# Patient Record
Sex: Female | Born: 1935 | Race: Black or African American | Hispanic: No | Smoking: Never smoker
Health system: Southern US, Community
[De-identification: ages and names within clinical notes are randomized; demographics above are authoritative.]

## PROBLEM LIST (undated history)

## (undated) ENCOUNTER — Inpatient Hospital Stay: Admission: EM | Payer: Self-pay | Source: Home / Self Care

## (undated) DIAGNOSIS — F0390 Unspecified dementia without behavioral disturbance: Secondary | ICD-10-CM

## (undated) DIAGNOSIS — M1990 Unspecified osteoarthritis, unspecified site: Secondary | ICD-10-CM

## (undated) DIAGNOSIS — J45909 Unspecified asthma, uncomplicated: Secondary | ICD-10-CM

## (undated) DIAGNOSIS — J40 Bronchitis, not specified as acute or chronic: Secondary | ICD-10-CM

## (undated) DIAGNOSIS — E041 Nontoxic single thyroid nodule: Secondary | ICD-10-CM

## (undated) DIAGNOSIS — H409 Unspecified glaucoma: Secondary | ICD-10-CM

## (undated) DIAGNOSIS — E119 Type 2 diabetes mellitus without complications: Secondary | ICD-10-CM

## (undated) DIAGNOSIS — C50919 Malignant neoplasm of unspecified site of unspecified female breast: Secondary | ICD-10-CM

## (undated) DIAGNOSIS — K5792 Diverticulitis of intestine, part unspecified, without perforation or abscess without bleeding: Secondary | ICD-10-CM

## (undated) DIAGNOSIS — E669 Obesity, unspecified: Secondary | ICD-10-CM

## (undated) DIAGNOSIS — I639 Cerebral infarction, unspecified: Secondary | ICD-10-CM

## (undated) DIAGNOSIS — I10 Essential (primary) hypertension: Secondary | ICD-10-CM

## (undated) DIAGNOSIS — G20 Parkinson's disease: Secondary | ICD-10-CM

## (undated) DIAGNOSIS — G20A1 Parkinson's disease without dyskinesia, without mention of fluctuations: Secondary | ICD-10-CM

## (undated) HISTORY — DX: Unspecified glaucoma: H40.9

## (undated) HISTORY — DX: Malignant neoplasm of unspecified site of unspecified female breast: C50.919

## (undated) HISTORY — DX: Essential (primary) hypertension: I10

## (undated) HISTORY — PX: ABDOMINAL HYSTERECTOMY: SHX81

## (undated) HISTORY — PX: KNEE SURGERY: SHX244

## (undated) HISTORY — DX: Diverticulitis of intestine, part unspecified, without perforation or abscess without bleeding: K57.92

## (undated) HISTORY — DX: Nontoxic single thyroid nodule: E04.1

## (undated) HISTORY — PX: JOINT REPLACEMENT: SHX530

## (undated) HISTORY — PX: APPENDECTOMY: SHX54

## (undated) HISTORY — DX: Unspecified osteoarthritis, unspecified site: M19.90

## (undated) HISTORY — PX: OTHER SURGICAL HISTORY: SHX169

---

## 1983-02-20 DIAGNOSIS — C50919 Malignant neoplasm of unspecified site of unspecified female breast: Secondary | ICD-10-CM

## 1983-02-20 HISTORY — PX: MASTECTOMY: SHX3

## 1983-02-20 HISTORY — DX: Malignant neoplasm of unspecified site of unspecified female breast: C50.919

## 2000-10-08 ENCOUNTER — Ambulatory Visit (HOSPITAL_COMMUNITY): Admission: RE | Admit: 2000-10-08 | Discharge: 2000-10-08 | Payer: Self-pay | Admitting: Family Medicine

## 2000-10-08 ENCOUNTER — Encounter: Payer: Self-pay | Admitting: Family Medicine

## 2001-10-19 ENCOUNTER — Encounter: Payer: Self-pay | Admitting: Family Medicine

## 2001-10-19 ENCOUNTER — Ambulatory Visit (HOSPITAL_COMMUNITY): Admission: RE | Admit: 2001-10-19 | Discharge: 2001-10-19 | Payer: Self-pay | Admitting: Family Medicine

## 2002-06-12 ENCOUNTER — Emergency Department (HOSPITAL_COMMUNITY): Admission: EM | Admit: 2002-06-12 | Discharge: 2002-06-12 | Payer: Self-pay | Admitting: Emergency Medicine

## 2002-06-12 ENCOUNTER — Encounter: Payer: Self-pay | Admitting: Emergency Medicine

## 2002-06-14 ENCOUNTER — Encounter: Payer: Self-pay | Admitting: Family Medicine

## 2002-06-14 ENCOUNTER — Ambulatory Visit (HOSPITAL_COMMUNITY): Admission: RE | Admit: 2002-06-14 | Discharge: 2002-06-14 | Payer: Self-pay | Admitting: Emergency Medicine

## 2002-06-22 ENCOUNTER — Ambulatory Visit (HOSPITAL_COMMUNITY): Admission: RE | Admit: 2002-06-22 | Discharge: 2002-06-22 | Payer: Self-pay | Admitting: Cardiology

## 2002-06-22 ENCOUNTER — Encounter: Payer: Self-pay | Admitting: Cardiology

## 2002-07-14 ENCOUNTER — Emergency Department (HOSPITAL_COMMUNITY): Admission: EM | Admit: 2002-07-14 | Discharge: 2002-07-14 | Payer: Self-pay | Admitting: Emergency Medicine

## 2002-07-14 ENCOUNTER — Encounter: Payer: Self-pay | Admitting: Emergency Medicine

## 2002-10-19 ENCOUNTER — Ambulatory Visit (HOSPITAL_COMMUNITY): Admission: RE | Admit: 2002-10-19 | Discharge: 2002-10-19 | Payer: Self-pay | Admitting: Family Medicine

## 2002-10-19 ENCOUNTER — Encounter: Payer: Self-pay | Admitting: Family Medicine

## 2002-11-06 ENCOUNTER — Emergency Department (HOSPITAL_COMMUNITY): Admission: EM | Admit: 2002-11-06 | Discharge: 2002-11-06 | Payer: Self-pay | Admitting: *Deleted

## 2002-11-24 ENCOUNTER — Ambulatory Visit (HOSPITAL_COMMUNITY): Admission: RE | Admit: 2002-11-24 | Discharge: 2002-11-24 | Payer: Self-pay | Admitting: Family Medicine

## 2002-11-28 ENCOUNTER — Encounter: Payer: Self-pay | Admitting: Orthopedic Surgery

## 2004-05-23 ENCOUNTER — Observation Stay (HOSPITAL_COMMUNITY): Admission: AD | Admit: 2004-05-23 | Discharge: 2004-05-26 | Payer: Self-pay | Admitting: Family Medicine

## 2004-05-23 ENCOUNTER — Ambulatory Visit: Payer: Self-pay | Admitting: *Deleted

## 2004-10-04 ENCOUNTER — Ambulatory Visit (HOSPITAL_COMMUNITY): Admission: RE | Admit: 2004-10-04 | Discharge: 2004-10-04 | Payer: Self-pay | Admitting: Family Medicine

## 2005-03-03 ENCOUNTER — Ambulatory Visit: Payer: Self-pay | Admitting: Orthopedic Surgery

## 2006-01-06 HISTORY — PX: FRACTURE SURGERY: SHX138

## 2006-01-29 ENCOUNTER — Ambulatory Visit: Payer: Self-pay | Admitting: Orthopedic Surgery

## 2006-06-29 ENCOUNTER — Ambulatory Visit: Payer: Self-pay | Admitting: Orthopedic Surgery

## 2006-07-20 ENCOUNTER — Ambulatory Visit: Payer: Self-pay | Admitting: Orthopedic Surgery

## 2006-11-19 ENCOUNTER — Ambulatory Visit (HOSPITAL_COMMUNITY): Admission: RE | Admit: 2006-11-19 | Discharge: 2006-11-19 | Payer: Self-pay | Admitting: Internal Medicine

## 2006-12-01 ENCOUNTER — Ambulatory Visit: Payer: Self-pay | Admitting: Orthopedic Surgery

## 2006-12-01 DIAGNOSIS — Z8679 Personal history of other diseases of the circulatory system: Secondary | ICD-10-CM | POA: Insufficient documentation

## 2006-12-01 DIAGNOSIS — M25519 Pain in unspecified shoulder: Secondary | ICD-10-CM | POA: Insufficient documentation

## 2006-12-28 ENCOUNTER — Inpatient Hospital Stay (HOSPITAL_COMMUNITY): Admission: EM | Admit: 2006-12-28 | Discharge: 2006-12-29 | Payer: Self-pay | Admitting: Emergency Medicine

## 2006-12-28 ENCOUNTER — Ambulatory Visit: Payer: Self-pay | Admitting: Orthopedic Surgery

## 2007-01-06 ENCOUNTER — Inpatient Hospital Stay: Admission: AD | Admit: 2007-01-06 | Discharge: 2007-03-06 | Payer: Self-pay | Admitting: Internal Medicine

## 2007-01-07 DIAGNOSIS — K5792 Diverticulitis of intestine, part unspecified, without perforation or abscess without bleeding: Secondary | ICD-10-CM

## 2007-01-07 HISTORY — DX: Diverticulitis of intestine, part unspecified, without perforation or abscess without bleeding: K57.92

## 2007-02-12 ENCOUNTER — Ambulatory Visit (HOSPITAL_COMMUNITY): Admission: RE | Admit: 2007-02-12 | Discharge: 2007-02-12 | Payer: Self-pay | Admitting: Internal Medicine

## 2007-03-10 ENCOUNTER — Ambulatory Visit: Payer: Self-pay | Admitting: Orthopedic Surgery

## 2007-03-29 ENCOUNTER — Observation Stay (HOSPITAL_COMMUNITY): Admission: EM | Admit: 2007-03-29 | Discharge: 2007-03-30 | Payer: Self-pay | Admitting: Emergency Medicine

## 2007-07-26 ENCOUNTER — Ambulatory Visit: Payer: Self-pay | Admitting: Orthopedic Surgery

## 2007-07-26 DIAGNOSIS — M25569 Pain in unspecified knee: Secondary | ICD-10-CM | POA: Insufficient documentation

## 2007-07-27 ENCOUNTER — Telehealth: Payer: Self-pay | Admitting: Orthopedic Surgery

## 2007-07-27 ENCOUNTER — Ambulatory Visit (HOSPITAL_COMMUNITY): Admission: RE | Admit: 2007-07-27 | Discharge: 2007-07-27 | Payer: Self-pay | Admitting: Orthopedic Surgery

## 2007-11-23 ENCOUNTER — Ambulatory Visit (HOSPITAL_COMMUNITY): Admission: RE | Admit: 2007-11-23 | Discharge: 2007-11-23 | Payer: Self-pay | Admitting: Internal Medicine

## 2008-11-27 ENCOUNTER — Ambulatory Visit: Payer: Self-pay | Admitting: Orthopedic Surgery

## 2008-11-27 ENCOUNTER — Ambulatory Visit (HOSPITAL_COMMUNITY): Admission: RE | Admit: 2008-11-27 | Discharge: 2008-11-27 | Payer: Self-pay | Admitting: Internal Medicine

## 2008-11-27 DIAGNOSIS — M19019 Primary osteoarthritis, unspecified shoulder: Secondary | ICD-10-CM | POA: Insufficient documentation

## 2009-01-02 ENCOUNTER — Ambulatory Visit (HOSPITAL_COMMUNITY): Admission: RE | Admit: 2009-01-02 | Discharge: 2009-01-02 | Payer: Self-pay | Admitting: Internal Medicine

## 2009-05-14 ENCOUNTER — Inpatient Hospital Stay (HOSPITAL_COMMUNITY): Admission: RE | Admit: 2009-05-14 | Discharge: 2009-05-18 | Payer: Self-pay | Admitting: Internal Medicine

## 2009-09-11 ENCOUNTER — Encounter: Payer: Self-pay | Admitting: Orthopedic Surgery

## 2009-09-11 ENCOUNTER — Ambulatory Visit (HOSPITAL_COMMUNITY): Admission: RE | Admit: 2009-09-11 | Discharge: 2009-09-11 | Payer: Self-pay | Admitting: Internal Medicine

## 2009-09-22 ENCOUNTER — Inpatient Hospital Stay (HOSPITAL_COMMUNITY): Admission: EM | Admit: 2009-09-22 | Discharge: 2009-09-27 | Payer: Self-pay | Admitting: Emergency Medicine

## 2009-09-24 ENCOUNTER — Encounter (INDEPENDENT_AMBULATORY_CARE_PROVIDER_SITE_OTHER): Payer: Self-pay | Admitting: Family Medicine

## 2009-10-01 ENCOUNTER — Ambulatory Visit: Payer: Self-pay | Admitting: Orthopedic Surgery

## 2009-10-01 DIAGNOSIS — M5430 Sciatica, unspecified side: Secondary | ICD-10-CM

## 2009-10-15 ENCOUNTER — Ambulatory Visit: Payer: Self-pay | Admitting: Orthopedic Surgery

## 2009-11-30 ENCOUNTER — Ambulatory Visit (HOSPITAL_COMMUNITY): Admission: RE | Admit: 2009-11-30 | Discharge: 2009-11-30 | Payer: Self-pay | Admitting: Internal Medicine

## 2009-12-19 ENCOUNTER — Telehealth: Payer: Self-pay | Admitting: Orthopedic Surgery

## 2009-12-25 ENCOUNTER — Encounter: Payer: Self-pay | Admitting: Orthopedic Surgery

## 2010-02-05 NOTE — Assessment & Plan Note (Signed)
Summary: new problem left hip pain/xr at [facility] 09/11/[facility]/mcr/[place]/bsf   Visit Type:  new problem Referring Provider:  Dr. [name]  CC:  left hip pain.  History of Present Illness: I saw Tiffany Hartman in the office today for an initial visit.  She is a 77 years old woman with the complaint of:  left hip pain  Xrays at [facility] on 09-11-09 show osteoarthritic hanges in the left hip.  Medications: Alprazolam, Naproxen, Furosemide, Gabapentin, levetricetan, Omeprazole, Metadate, Amlodipine, Aspirin, Oscal, Lipitor, Benicar, Ventolin, Iron, Vicodin.  This is a 77 year old female who was recently discharged from the hospital with possible Parkinson's syndrome and TIA complains of LEFT hip pain LEFT groin pain left-sided back pain and LEFT knee pain  The pain is described as moderate, 6/10, throbbing, intermittent, with gradual onset partially relieved with Vicodin extra strength Tylenol.  Pain is been present for approximately 4 months radiating into her LEFT knee she describes hip pain but says the pain starts in the lower back and radiates into the LEFT groin and LEFT knee.  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 Cardiovascular:  Denies chest pain, palpitations, fainting, and murmurs. Respiratory:  Complains of wheezing; denies short of breath, couch, tightness, pain on inspiration, and snoring . Gastrointestinal:  Denies heartburn, nausea, vomiting, diarrhea, constipation, and blood in your  stools. Genitourinary:  Denies frequency, urgency, difficulty urinating, painful urination, flank pain, and bleeding in urine. Neurologic:  Complains of tingling and tremors; denies numbness, unsteady gait, dizziness, and seizure. Musculoskeletal:  Complains of joint pain and muscle pain; denies swelling, instability, stiffness, redness, and heat. Endocrine:  Complains of heat or cold intolerance; denies excessive thirst and exessive urination. Psychiatric:  Complains of nervousness, depression, and anxiety; denies hallucinations. Skin:  Denies changes in the skin, poor healing, rash, itching, and redness. HEENT:  Complains of blurred or double vision and watering; denies eye pain and redness. Immunology:  Denies seasonal allergies, sinus problems, and allergic to bee stings. Hemoatologic:  Denies easy bleeding and brusing.  Physical Exam  Additional Exam:  she comes in today ambulating with a cane her overall body habitus large  She basically has posturing unable to sit on her LEFT gluteal region.  She has tenderness in the lower back and LEFT greater trochanteric region.  She lose her leg in full extension.  She says her knee is swollen but there is no effusion there is a previous total knee incision.  She did not regain full flexion of the knee because she cannot tolerate therapy secondary to her back problem  Her knee replacement surgery was several years ago  She appears to have acute sciatica.   Impression & Recommendations:  Problem # 1:  SCIATICA (ICD-724.3) Assessment New  x-rays of the pelvis and hips show bilateral hip disease mild or moderate arthritis nothing to account for her acute pain.  She does have lumbar disc disease no x-rays were done there I don't think any unnecessary right now unless she doesn't get better  Recommend double  strength survey Dosepak for 12 days increased gabapentin 300 mg a day to 300 mg 3 times a day  She is on strict rest for 2 weeks in  followup 2 weeks  Orders: Est. Patient Level III ([address])  Medications Added to Medication List This Visit: 1)  Prednisone (pak) 10 Mg Tabs (Prednisone) .... As directed 2)  Gabapentin 300 Mg Caps (Gabapentin) .Marland Kitchen.. 1 tid  Patient Instructions: 1)  Please schedule a follow-up appointment in 2 weeks. Prescriptions: GABAPENTIN 300 MG CAPS (GABAPENTIN) 1 tid  #30 x 0   Entered and Authorized by:   Fuller Canada MD   Signed by:   Fuller Canada MD on 10/01/2009   Method used:   Handwritten   RxID:   [id number] PREDNISONE (PAK) 10 MG TABS (PREDNISONE) as directed  #1 x 0   Entered and Authorized by:   Fuller Canada MD   Signed by:   Fuller Canada MD on 10/01/2009   Method used:   Print then Give to Patient   RxID:   [id number]

## 2010-02-05 NOTE — Letter (Signed)
Summary: History form  History form   Imported By: Jacklynn Ganong 10/01/2009 14:01:51  _____________________________________________________________________  External Attachment:    Type:   Image     Comment:   External Document

## 2010-02-05 NOTE — Assessment & Plan Note (Signed)
Summary: 2 WE [facility] LEFT HIP/MCR/BSF   Visit Type:  Follow-up Referring Provider:  Dr. [name]  CC:  left hip pain.  History of Present Illness: neuroforaminal of acute sciatic pain in her LEFT leg and hip, treated with Neurontin and prednisone.  Currently, no complaints in the hip or the back.  She was on gabapentin 300 mg daily, so she can return to that.    Allergies: No Known Drug Allergies  Physical Exam  Additional Exam:  Normal ambulation uses a cane, but says she doesn't need it. She is no longer in extreme pain as normal use of her LEFT lower extremity.   Impression & Recommendations:  Problem # 1:  SCIATICA (ICD-724.3) Assessment Improved  Orders: Est. Patient Level II (81191)  Medications Added to Medication List This Visit: 1)  Neurontin 300 Mg Caps (Gabapentin) .Marland Kitchen.. 1 by mouth daily  Patient Instructions: 1)  Please schedule a follow-up appointment as needed. Prescriptions: NEURONTIN 300 MG  CAPS (GABAPENTIN) 1 by mouth daily  #90 x 5   Entered and Authorized by:   Fuller Canada MD   Signed by:   Fuller Canada MD on 10/15/2009   Method used:   Faxed to ...       [facility]  [address] [phone]* (retail)       [address]       [place], [place]  [address]       Ph: [phone] or [phone]       Fax: [phone]   RxID:   [id number]

## 2010-02-07 NOTE — Medication Information (Signed)
Summary: Training and development officer coding  RX Folder Community CCRX coding   Imported By: Cammie Sickle 01/08/2010 18:03:27  _____________________________________________________________________  External Attachment:    Type:   Image     Comment:   External Document

## 2010-02-07 NOTE — Progress Notes (Signed)
Summary: wants prescription for lift chair  Phone Note Call from Patient   Summary of Call: Tiffany Hartman ([birth year]) asked if you will write a prescription for her to get a lift chair.  Said it hurts her knees, hip and back to get out of a normal chair.  [facility] Her # [phone] Initial call taken by: Jacklynn Ganong,  December 19, 2009 10:18 AM  Follow-up for Phone Call        WE DONT DO LIFT CHAIR ORDERS  Follow-up by: Fuller Canada MD,  December 19, 2009 10:32 AM  Additional Follow-up for Phone Call Additional follow up Details #1::        Patient advised Additional Follow-up by: Jacklynn Ganong,  December 19, 2009 11:48 AM

## 2010-03-21 LAB — DIFFERENTIAL
Basophils Absolute: 0.1 10*3/uL (ref 0.0–0.1)
Basophils Relative: 1 % (ref 0–1)
Eosinophils Absolute: 0.3 10*3/uL (ref 0.0–0.7)
Lymphocytes Relative: 38 % (ref 12–46)
Lymphs Abs: 2.1 10*3/uL (ref 0.7–4.0)
Neutro Abs: 2.5 10*3/uL (ref 1.7–7.7)

## 2010-03-21 LAB — APTT: aPTT: 36 seconds (ref 24–37)

## 2010-03-21 LAB — POCT CARDIAC MARKERS
CKMB, poc: <1 ng/mL — ABNORMAL LOW (ref 1.0–8.0)
Troponin i, poc: <0.05 ng/mL (ref 0.00–0.09)

## 2010-03-21 LAB — URINALYSIS, ROUTINE W REFLEX MICROSCOPIC
Bilirubin Urine: NEGATIVE
Glucose, UA: NEGATIVE mg/dL
Ketones, ur: NEGATIVE mg/dL
Protein, ur: NEGATIVE mg/dL
pH: 5.5 (ref 5.0–8.0)

## 2010-03-21 LAB — CBC
HCT: 37.5 % (ref 36.0–46.0)
Hemoglobin: 12.6 g/dL (ref 12.0–15.0)
MCH: 29.4 pg (ref 26.0–34.0)
MCHC: 33.5 g/dL (ref 30.0–36.0)
MCV: 87.7 fL (ref 78.0–100.0)
RDW: 15.4 % (ref 11.5–15.5)

## 2010-03-21 LAB — BASIC METABOLIC PANEL
BUN: 11 mg/dL (ref 6–23)
CO2: 28 mEq/L (ref 19–32)
Chloride: 103 mEq/L (ref 96–112)
Creatinine, Ser: 0.87 mg/dL (ref 0.4–1.2)
GFR calc Af Amer: >60 mL/min (ref >60)
Sodium: 137 mEq/L (ref 135–145)

## 2010-03-21 LAB — GLUCOSE, CAPILLARY
Glucose-Capillary: 127 mg/dL — ABNORMAL HIGH (ref 70–99)
Glucose-Capillary: 89 mg/dL (ref 70–99)
Glucose-Capillary: 98 mg/dL (ref 70–99)

## 2010-03-26 LAB — GLUCOSE, CAPILLARY
Glucose-Capillary: 110 mg/dL — ABNORMAL HIGH (ref 70–99)
Glucose-Capillary: 114 mg/dL — ABNORMAL HIGH (ref 70–99)
Glucose-Capillary: 120 mg/dL — ABNORMAL HIGH (ref 70–99)
Glucose-Capillary: 125 mg/dL — ABNORMAL HIGH (ref 70–99)
Glucose-Capillary: 126 mg/dL — ABNORMAL HIGH (ref 70–99)
Glucose-Capillary: 126 mg/dL — ABNORMAL HIGH (ref 70–99)
Glucose-Capillary: 127 mg/dL — ABNORMAL HIGH (ref 70–99)
Glucose-Capillary: 129 mg/dL — ABNORMAL HIGH (ref 70–99)

## 2010-03-26 LAB — URINALYSIS, ROUTINE W REFLEX MICROSCOPIC
Glucose, UA: NEGATIVE mg/dL
Ketones, ur: NEGATIVE mg/dL
Protein, ur: NEGATIVE mg/dL
Urobilinogen, UA: 0.2 mg/dL (ref 0.0–1.0)

## 2010-03-26 LAB — BASIC METABOLIC PANEL
CO2: 27 mEq/L (ref 19–32)
Chloride: 109 mEq/L (ref 96–112)
GFR calc Af Amer: >60 mL/min (ref >60)
Potassium: 3.5 mEq/L (ref 3.5–5.1)

## 2010-03-26 LAB — DIFFERENTIAL
Basophils Relative: 1 % (ref 0–1)
Eosinophils Absolute: 0.2 10*3/uL (ref 0.0–0.7)
Eosinophils Relative: 4 % (ref 0–5)
Lymphs Abs: 2 10*3/uL (ref 0.7–4.0)
Monocytes Absolute: 0.4 10*3/uL (ref 0.1–1.0)
Monocytes Relative: 6 % (ref 3–12)

## 2010-03-26 LAB — C-REACTIVE PROTEIN: CRP: 1.2 mg/dL — ABNORMAL HIGH (ref <0.6)

## 2010-03-26 LAB — CBC
HCT: 36.3 % (ref 36.0–46.0)
Hemoglobin: 12.5 g/dL (ref 12.0–15.0)
MCHC: 34.4 g/dL (ref 30.0–36.0)
MCV: 86.6 fL (ref 78.0–100.0)
RBC: 4.19 MIL/uL (ref 3.87–5.11)
WBC: 5.8 10*3/uL (ref 4.0–10.5)

## 2010-03-26 LAB — SEDIMENTATION RATE: Sed Rate: 20 mm/hr (ref 0–22)

## 2010-03-26 LAB — TSH: TSH: 0.843 u[IU]/mL (ref 0.350–4.500)

## 2010-05-21 NOTE — H&P (Signed)
Tiffany Hartman, Tiffany Hartman                  ACCOUNT NO.:  [id number]   MEDICAL RECORD NO.:  [id number]          PATIENT TYPE:  OBV   LOCATION:  A216                          FACILITY:  [facility]   PHYSICIAN:  Tesfaye D. [name], MD   DATE OF BIRTH:  30-Apr-1927   DATE OF ADMISSION:  03/29/2007  DATE OF DISCHARGE:  LH                              HISTORY & PHYSICAL   CHIEF COMPLAINT:  Change in mental status.   HISTORY OF PRESENT ILLNESS:  This is a 77 year old female patient with a  history of hypertension, diet-controlled diabetes mellitus, and recent  fracture of the right femur.  She was brought to the emergency room with  the above complaint.  The patient was recently discharged from [facility] where she had surgery for her right femur fracture close to the  knee that was replaced in 1997.  The patient has been doing well  postoperatively, however, she had pain medications Xanax which she was  taking p.r.n.  However, according to the daughter, the patient has  probably taken several doses of Percocet yesterday.  She became very  sleepy and difficult to arouse.  The patient was brought to the  emergency room where she was evaluated.  CT scan of the head was  negative.  The patient remained sleepy and confused.  She was admitted  for 24 hours observation.  Over the night, the patient received Narcan  0.2 mg.  The patient is now awake, alert, and she is back to baseline.  The patient has no new complaints.   REVIEW OF SYSTEMS:  No headache, fever, chills, chest pain, nausea,  vomiting, abdominal pain, dysuria, urgency, or frequency of urination.   PAST MEDICAL HISTORY:  1. Hypertension.  2. Diabetes mellitus, diet-controlled.  3. History of right hip replacement in 1997.  4. Fracture of right distal femur and status post recent surgery      status post recent surgery at [facility].   CURRENT MEDICATIONS:  1. HCTZ 12.5 mg 1 tablet p.o. daily.  2. Baclofen 2 tablets b.i.d.  3.  Lexapro 10 mg daily.  4. Omeprazole 20 mg daily.  5. Lasix 20 mg daily.  6. Alprazolam 0.5 mg b.i.d.  7. Gabapentin 300 mg t.i.d.  8. Percocet 7.5/650 one tablet q 4 hours p.r.n.  9. Os-Cal with Vitamin d 1 tablet p.o. b.i.d.   SOCIAL HISTORY:  The patient has no history of alcohol, tobacco or  substance abuse.   PHYSICAL EXAMINATION:  The patient is alert, awake and resting.  She is  oriented x3.  VITAL SIGNS:  Blood pressure 138/76, pulse 70, respiratory rate 20,  temperature 97.8 degrees Fahrenheit.  HEENT:  Pupils are equal, reactive.  NECK:  Supple.  CHEST:  Clear lung fields, good air entry.  CARDIOVASCULAR SYSTEM:  First and second heart sounds heard.  No murmur,  no gallop.  ABDOMEN:  Soft, lax, bowel sounds are positive, obese, no mass, no  organomegaly.  EXTREMITIES:  The patient splint to the right knee and no leg edema.   ASSESSMENT:  This is a  77 year old female patient who has a history of  hypertension, diabetes mellitus and recent fracture over the right  distal femur who had surgery at [facility] and was brought to the  emergency room due to lethargy and confusion.  The patient has been  taking p.r.n. doses of Percocet.  Her CT scan of the head was negative.  The patient probably had an overdose of narcotics which has made a  change in her mental status.  The patient is currently back to her  baseline.   PLAN:  Will observe the patient for 24 hours and will adjust the  medications.  Will continue follow up with orthopedics.      Tesfaye D. [name], MD  Electronically Signed     TDF/MEDQ  D:  03/30/2007  T:  03/30/2007  Job:  [id number]

## 2010-05-21 NOTE — H&P (Signed)
Tiffany Hartman, Tiffany Hartman                  ACCOUNT NO.:  [id number]   MEDICAL RECORD NO.:  [id number]          PATIENT TYPE:  INP   LOCATION:  A301                          FACILITY:  [facility]   PHYSICIAN:  Vickki Hearing, M.D.DATE OF BIRTH:  Oct 22, 1927   DATE OF ADMISSION:  12/28/2006  DATE OF DISCHARGE:  LH                              HISTORY & PHYSICAL   CHIEF COMPLAINT:  Right knee pain.   HISTORY:  A 77 year old female with multiple medical problems presents  with a chief complaint of pain in her right knee status post a fall  earlier this morning. She says that she was walking up her stairs, she  tripped, landed on her right thigh and felt a loud snapping popping  sensation in the right leg.  She then attempted to get up, could not and  EMS was called. She was stable in the emergency room. Radiographs  revealed a supracondylar femur fracture above a total knee. It is  essentially a transverse type fracture with total knee implant which is  Civil engineer, contracting implant. The fracture shows angulation and rotation.   REVIEW OF SYSTEMS:  She has poor function in both shoulders which have  bilateral chronic rotator cuff tears with rotator cuff early  arthropathy.   MEDICATIONS:  1. Clonidine 0.1 mg twice daily, 2 in the morning and 1 at night.  2. Xanax 0.5 mg twice daily.  3. Butalbital aspirin caffeine 50/325/40 mg 3 times a day.  4. Diovan 320 mg once daily.  5. Aspirin once a day 81 mg.   She  has had a left and a right total knee replacement.   SOCIAL HISTORY:  Does not smoke or drink.  She has a supportive family.  She has also had a mastectomy.   She had some hypertension, diet-controlled diabetes, glaucoma, increased  cholesterol.   She denies any respiratory problems, GI problems, GU problems. She does  have some back and neck degenerative disease. She denies fever or  chills.   VITAL SIGNS:  Today 97 temperature, pulse 64, respiratory rate 20, blood  pressure 133/86.  Room air  sat 96%. CBG 1600 hours was 105.  She was awake, alert and oriented  HEAD/FACE:  Showed no trauma.  NECK:  She does have some restrictions in rotation, flexion of the  supracervical spine which is chronic from degenerative disk disease,  nontender today.  Lymph nodes in the cervical spine were negative.  ABDOMEN:  Soft, nontender and nondistended.  UPPER EXTREMITIES:  She has decreased range of motion both upper  extremities and flexion, internal, external rotation with poor rotator  cuff function, especially the supraspinatus tendon which is noted to  have chronic tearing.  LEFT LOWER EXTREMITIES:  Longitudinal incision for a left total knee  replacement.  Range of motion there is approximately 0 up to 110 with a  stable knee.  The right leg is in traction.  Fortunately there has not been a lot of  swelling.  There is tenderness at the fracture site and gross leg  alignment in traction is good.   Radiographs  show a transverse supracondylar femur fracture above a right  total knee replacement.   PLAN:  The patient will remain in traction at this point. We will make  her comfortable and then we will make plans for transfer for this  difficult injury.   With family present, the patient has been alerted to the complications  and difficulty with this fracture which may indeed end in an amputation.  There is a risk for nonunion, infection, stiffness of the knee joint,  the bone is soft and fractured fixation may collapse.      Vickki Hearing, M.D.  Electronically Signed     SEH/MEDQ  D:  12/28/2006  T:  12/29/2006  Job:  [id number]

## 2010-05-21 NOTE — Discharge Summary (Signed)
NAMESALEHA, [name]                  ACCOUNT NO.:  [id number]   MEDICAL RECORD NO.:  [id number]          PATIENT TYPE:  INP   LOCATION:  A301                          FACILITY:  [facility]   PHYSICIAN:  Vickki Hearing, M.D.DATE OF BIRTH:  15-May-1927   DATE OF ADMISSION:  12/28/2006  DATE OF DISCHARGE:  12/23/2008LH                               DISCHARGE SUMMARY   ADMITTING DIAGNOSIS:  Periprosthetic fracture right femur.   DISCHARGE DIAGNOSIS:  Periprosthetic fracture right femur.   HISTORY:  This is a 77 year old female with a history of hypertension,  diet controlled diabetes, status post a __________ , right total knee  replacement in 1997 complicated by a flexion contracture. She was in her  usual state of health when she fell going up some stairs and fractured  her right femur above the prosthesis.  She was admitted, placed in  traction, given analgesics for pain control and was stabilized.  Her  admission hemoglobin was 11, this morning hemoglobin was 10.   I called [facility] for a transfer and was able to secure transfer  to Dr. Okey Regal with transfer, per his request, through the emergency  department for medical evaluation, and then appropriate disposition from  there.  She is discharged on clonidine 0.1 mg two in the morning and 1  at night.  Xanax 0.5 mg twice daily.  Diovan 320 mg once daily.  She is  on aspirin once a day, that is going to be held.  We gave her 1 dose of  Lovenox 30 mg subcu x1.  She is in traction.   DISPOSITION:  To [facility].  Overall condition at this time is  stable.  Patient transferred to physician's inexperience with this type  of fracture and the difficult nature of the treatment in this type of  fracture. The patient has been counseled on the possibility of nonunion,  infection, stiffness, and even amputation.      Vickki Hearing, M.D.  Electronically Signed     SEH/MEDQ  D:  12/29/2006  T:  12/29/2006  Job:  [id number]

## 2010-09-30 LAB — URINALYSIS, ROUTINE W REFLEX MICROSCOPIC
Bilirubin Urine: NEGATIVE
Glucose, UA: NEGATIVE
Hgb urine dipstick: NEGATIVE
Protein, ur: NEGATIVE
pH: 5

## 2010-09-30 LAB — DIFFERENTIAL
Basophils Absolute: 0
Basophils Relative: 1
Lymphocytes Relative: 29
Monocytes Absolute: 0.3
Monocytes Relative: 5
Neutro Abs: 3.7
Neutrophils Relative %: 61

## 2010-09-30 LAB — RAPID URINE DRUG SCREEN, HOSP PERFORMED
Amphetamines: NOT DETECTED
Benzodiazepines: POSITIVE — AB
Cocaine: NOT DETECTED
Tetrahydrocannabinol: NOT DETECTED

## 2010-09-30 LAB — CBC
Hemoglobin: 10.9 — ABNORMAL LOW
MCHC: 33.5
RBC: 3.86 — ABNORMAL LOW

## 2010-09-30 LAB — BASIC METABOLIC PANEL
CO2: 30
Calcium: 9
Creatinine, Ser: 1.37 — ABNORMAL HIGH
GFR calc Af Amer: 45 — ABNORMAL LOW
GFR calc non Af Amer: 37 — ABNORMAL LOW
Sodium: 140

## 2010-09-30 LAB — CARDIAC PANEL(CRET KIN+CKTOT+MB+TROPI)
CK, MB: 3.9
CK, MB: 4.4 — ABNORMAL HIGH
Relative Index: 3.4 — ABNORMAL HIGH
Total CK: 130
Total CK: 146
Total CK: 191 — ABNORMAL HIGH

## 2010-10-11 LAB — DIFFERENTIAL
Basophils Absolute: 0
Basophils Absolute: 0.1
Basophils Relative: 0
Basophils Relative: 1
Eosinophils Absolute: 0.1 — ABNORMAL LOW
Eosinophils Absolute: 0.2
Eosinophils Relative: 2
Lymphs Abs: 1.5
Monocytes Relative: 5
Neutrophils Relative %: 59

## 2010-10-11 LAB — COMPREHENSIVE METABOLIC PANEL
ALT: 12
AST: 13
Alkaline Phosphatase: 59
CO2: 29
Chloride: 108
GFR calc Af Amer: >60
GFR calc non Af Amer: >60
Glucose, Bld: 158 — ABNORMAL HIGH
Potassium: 4.1
Sodium: 142
Total Bilirubin: 0.3

## 2010-10-11 LAB — CBC
Hemoglobin: 11.2 — ABNORMAL LOW
MCHC: 32.8
MCV: 85.3
RBC: 3.62 — ABNORMAL LOW
RBC: 3.99
RDW: 14.4
WBC: 7.2

## 2010-10-11 LAB — PROTIME-INR: Prothrombin Time: 13.4

## 2010-11-21 ENCOUNTER — Other Ambulatory Visit (HOSPITAL_COMMUNITY): Payer: Self-pay | Admitting: Internal Medicine

## 2010-11-21 DIAGNOSIS — Z139 Encounter for screening, unspecified: Secondary | ICD-10-CM

## 2010-12-03 ENCOUNTER — Ambulatory Visit (HOSPITAL_COMMUNITY): Payer: Medicare Other

## 2010-12-10 ENCOUNTER — Ambulatory Visit (HOSPITAL_COMMUNITY)
Admission: RE | Admit: 2010-12-10 | Discharge: 2010-12-10 | Disposition: A | Discharge status: Home / Self Care | Payer: Medicare Other | Source: Ambulatory Visit | Attending: Internal Medicine | Admitting: Internal Medicine

## 2010-12-10 DIAGNOSIS — Z1231 Encounter for screening mammogram for malignant neoplasm of breast: Secondary | ICD-10-CM | POA: Insufficient documentation

## 2010-12-10 DIAGNOSIS — Z139 Encounter for screening, unspecified: Secondary | ICD-10-CM

## 2011-02-04 DIAGNOSIS — I10 Essential (primary) hypertension: Secondary | ICD-10-CM | POA: Diagnosis not present

## 2011-02-04 DIAGNOSIS — R7309 Other abnormal glucose: Secondary | ICD-10-CM | POA: Diagnosis not present

## 2011-02-04 DIAGNOSIS — Z0000 Encounter for general adult medical examination without abnormal findings: Secondary | ICD-10-CM | POA: Diagnosis not present

## 2011-02-04 DIAGNOSIS — E119 Type 2 diabetes mellitus without complications: Secondary | ICD-10-CM | POA: Diagnosis not present

## 2011-03-24 DIAGNOSIS — H4011X0 Primary open-angle glaucoma, stage unspecified: Secondary | ICD-10-CM | POA: Diagnosis not present

## 2011-03-24 DIAGNOSIS — E11319 Type 2 diabetes mellitus with unspecified diabetic retinopathy without macular edema: Secondary | ICD-10-CM | POA: Diagnosis not present

## 2011-03-24 DIAGNOSIS — H409 Unspecified glaucoma: Secondary | ICD-10-CM | POA: Diagnosis not present

## 2011-04-27 ENCOUNTER — Observation Stay (HOSPITAL_COMMUNITY)
Admission: EM | Admit: 2011-04-27 | Discharge: 2011-04-30 | Disposition: A | Discharge status: Home / Self Care | Payer: Medicare Other | Attending: Internal Medicine | Admitting: Internal Medicine

## 2011-04-27 ENCOUNTER — Emergency Department (HOSPITAL_COMMUNITY): Payer: Medicare Other

## 2011-04-27 ENCOUNTER — Encounter (HOSPITAL_COMMUNITY): Payer: Self-pay

## 2011-04-27 DIAGNOSIS — Z7982 Long term (current) use of aspirin: Secondary | ICD-10-CM | POA: Diagnosis not present

## 2011-04-27 DIAGNOSIS — R42 Dizziness and giddiness: Secondary | ICD-10-CM | POA: Insufficient documentation

## 2011-04-27 DIAGNOSIS — R0602 Shortness of breath: Secondary | ICD-10-CM | POA: Diagnosis not present

## 2011-04-27 DIAGNOSIS — E876 Hypokalemia: Secondary | ICD-10-CM | POA: Diagnosis not present

## 2011-04-27 DIAGNOSIS — I10 Essential (primary) hypertension: Secondary | ICD-10-CM | POA: Diagnosis not present

## 2011-04-27 DIAGNOSIS — G3183 Dementia with Lewy bodies: Secondary | ICD-10-CM | POA: Insufficient documentation

## 2011-04-27 DIAGNOSIS — Z8673 Personal history of transient ischemic attack (TIA), and cerebral infarction without residual deficits: Secondary | ICD-10-CM | POA: Diagnosis not present

## 2011-04-27 DIAGNOSIS — F0280 Dementia in other diseases classified elsewhere without behavioral disturbance: Secondary | ICD-10-CM | POA: Insufficient documentation

## 2011-04-27 DIAGNOSIS — R079 Chest pain, unspecified: Secondary | ICD-10-CM | POA: Diagnosis not present

## 2011-04-27 DIAGNOSIS — Z79899 Other long term (current) drug therapy: Secondary | ICD-10-CM | POA: Insufficient documentation

## 2011-04-27 DIAGNOSIS — M1990 Unspecified osteoarthritis, unspecified site: Secondary | ICD-10-CM | POA: Diagnosis not present

## 2011-04-27 DIAGNOSIS — R0789 Other chest pain: Secondary | ICD-10-CM | POA: Diagnosis not present

## 2011-04-27 DIAGNOSIS — E785 Hyperlipidemia, unspecified: Secondary | ICD-10-CM | POA: Diagnosis not present

## 2011-04-27 HISTORY — DX: Parkinson's disease without dyskinesia, without mention of fluctuations: G20.A1

## 2011-04-27 HISTORY — DX: Parkinson's disease: G20

## 2011-04-27 HISTORY — DX: Unspecified dementia, unspecified severity, without behavioral disturbance, psychotic disturbance, mood disturbance, and anxiety: F03.90

## 2011-04-27 LAB — DIFFERENTIAL
Basophils Relative: 1 % (ref 0–1)
Eosinophils Absolute: 0.1 10*3/uL (ref 0.0–0.7)
Eosinophils Relative: 2 % (ref 0–5)
Lymphs Abs: 2.1 10*3/uL (ref 0.7–4.0)
Monocytes Absolute: 0.6 10*3/uL (ref 0.1–1.0)
Monocytes Relative: 8 % (ref 3–12)
Neutrophils Relative %: 60 % (ref 43–77)

## 2011-04-27 LAB — TROPONIN I: Troponin I: 0.4 ng/mL (ref <0.30)

## 2011-04-27 LAB — URINALYSIS, ROUTINE W REFLEX MICROSCOPIC
Glucose, UA: NEGATIVE mg/dL
Ketones, ur: NEGATIVE mg/dL
Leukocytes, UA: NEGATIVE
Nitrite: NEGATIVE
pH: 6 (ref 5.0–8.0)

## 2011-04-27 LAB — CBC
HCT: 36.5 % (ref 36.0–46.0)
Hemoglobin: 11.8 g/dL — ABNORMAL LOW (ref 12.0–15.0)
MCH: 28.5 pg (ref 26.0–34.0)
MCHC: 32.3 g/dL (ref 30.0–36.0)
MCV: 88.2 fL (ref 78.0–100.0)
RBC: 4.14 MIL/uL (ref 3.87–5.11)

## 2011-04-27 LAB — COMPREHENSIVE METABOLIC PANEL
Albumin: 3.4 g/dL — ABNORMAL LOW (ref 3.5–5.2)
Alkaline Phosphatase: 77 U/L (ref 39–117)
BUN: 13 mg/dL (ref 6–23)
Calcium: 9.2 mg/dL (ref 8.4–10.5)
Creatinine, Ser: 0.9 mg/dL (ref 0.50–1.10)
GFR calc Af Amer: 68 mL/min — ABNORMAL LOW (ref >90)
Glucose, Bld: 134 mg/dL — ABNORMAL HIGH (ref 70–99)
Total Protein: 6.9 g/dL (ref 6.0–8.3)

## 2011-04-27 LAB — POCT I-STAT TROPONIN I

## 2011-04-27 LAB — CARDIAC PANEL(CRET KIN+CKTOT+MB+TROPI)
CK, MB: 2.5 ng/mL (ref 0.3–4.0)
Relative Index: 2 (ref 0.0–2.5)
Troponin I: <0.3 ng/mL (ref <0.30)

## 2011-04-27 MED ORDER — HYDROCODONE-ACETAMINOPHEN 5-325 MG PO TABS
1.0000 | ORAL_TABLET | Freq: Once | ORAL | Status: AC
  Administered 2011-04-27: 1 via ORAL
  Filled 2011-04-27: qty 1

## 2011-04-27 MED ORDER — GABAPENTIN 300 MG PO CAPS
300.0000 mg | ORAL_CAPSULE | Freq: Every day | ORAL | Status: DC
  Administered 2011-04-28 – 2011-04-30 (×3): 300 mg via ORAL
  Filled 2011-04-27 (×3): qty 1

## 2011-04-27 MED ORDER — POTASSIUM CHLORIDE CRYS ER 20 MEQ PO TBCR
40.0000 meq | EXTENDED_RELEASE_TABLET | Freq: Once | ORAL | Status: AC
  Administered 2011-04-27: 40 meq via ORAL
  Filled 2011-04-27: qty 2

## 2011-04-27 MED ORDER — NITROGLYCERIN 0.4 MG SL SUBL: 0.4000 mg | SUBLINGUAL_TABLET | SUBLINGUAL | Status: DC | PRN

## 2011-04-27 MED ORDER — CALCIUM CARBONATE-VITAMIN D 500-200 MG-UNIT PO TABS
1.0000 | ORAL_TABLET | Freq: Every day | ORAL | Status: DC
  Administered 2011-04-28 – 2011-04-30 (×3): 1 via ORAL
  Filled 2011-04-27 (×3): qty 1

## 2011-04-27 MED ORDER — AMLODIPINE BESYLATE 5 MG PO TABS
5.0000 mg | ORAL_TABLET | Freq: Every day | ORAL | Status: DC
  Administered 2011-04-28 – 2011-04-30 (×3): 5 mg via ORAL
  Filled 2011-04-27 (×3): qty 1

## 2011-04-27 MED ORDER — FUROSEMIDE 20 MG PO TABS
20.0000 mg | ORAL_TABLET | Freq: Every day | ORAL | Status: DC
  Administered 2011-04-28 – 2011-04-29 (×2): 20 mg via ORAL
  Filled 2011-04-27 (×3): qty 1

## 2011-04-27 MED ORDER — DONEPEZIL HCL 5 MG PO TABS
5.0000 mg | ORAL_TABLET | Freq: Every day | ORAL | Status: DC
  Administered 2011-04-28 – 2011-04-30 (×3): 5 mg via ORAL
  Filled 2011-04-27 (×3): qty 1

## 2011-04-27 MED ORDER — PANTOPRAZOLE SODIUM 40 MG PO TBEC
40.0000 mg | DELAYED_RELEASE_TABLET | Freq: Every day | ORAL | Status: DC
  Administered 2011-04-28 – 2011-04-29 (×2): 40 mg via ORAL
  Filled 2011-04-27 (×2): qty 1

## 2011-04-27 MED ORDER — ENOXAPARIN SODIUM 40 MG/0.4ML ~~LOC~~ SOLN
40.0000 mg | SUBCUTANEOUS | Status: DC
  Administered 2011-04-28 – 2011-04-30 (×3): 40 mg via SUBCUTANEOUS
  Filled 2011-04-27 (×4): qty 0.4

## 2011-04-27 MED ORDER — SODIUM CHLORIDE 0.45 % IV SOLN
INTRAVENOUS | Status: DC
  Administered 2011-04-29: 17:00:00 via INTRAVENOUS
  Filled 2011-04-27 (×2): qty 1000

## 2011-04-27 MED ORDER — FERROUS SULFATE 325 (65 FE) MG PO TABS
325.0000 mg | ORAL_TABLET | Freq: Every day | ORAL | Status: DC
  Administered 2011-04-28 – 2011-04-30 (×3): 325 mg via ORAL
  Filled 2011-04-27 (×5): qty 1

## 2011-04-27 MED ORDER — MECLIZINE HCL 12.5 MG PO TABS: 25.0000 mg | ORAL_TABLET | Freq: Four times a day (QID) | ORAL | Status: DC | PRN

## 2011-04-27 MED ORDER — HYDROCODONE-ACETAMINOPHEN 5-325 MG PO TABS
1.0000 | ORAL_TABLET | Freq: Two times a day (BID) | ORAL | Status: DC
  Administered 2011-04-28 – 2011-04-30 (×6): 1 via ORAL
  Filled 2011-04-27 (×5): qty 1

## 2011-04-27 MED ORDER — ASPIRIN EC 81 MG PO TBEC
81.0000 mg | DELAYED_RELEASE_TABLET | Freq: Every day | ORAL | Status: DC
  Administered 2011-04-28 – 2011-04-30 (×3): 81 mg via ORAL
  Filled 2011-04-27 (×3): qty 1

## 2011-04-27 MED ORDER — ALPRAZOLAM 0.25 MG PO TABS
0.2500 mg | ORAL_TABLET | Freq: Two times a day (BID) | ORAL | Status: DC
  Administered 2011-04-27 – 2011-04-30 (×6): 0.25 mg via ORAL
  Filled 2011-04-27 (×6): qty 1

## 2011-04-27 MED ORDER — IRBESARTAN 300 MG PO TABS
300.0000 mg | ORAL_TABLET | Freq: Every day | ORAL | Status: DC
  Administered 2011-04-28 – 2011-04-30 (×3): 300 mg via ORAL
  Filled 2011-04-27 (×3): qty 1

## 2011-04-27 MED ORDER — NITROGLYCERIN 2 % TD OINT
1.0000 [in_us] | TOPICAL_OINTMENT | Freq: Once | TRANSDERMAL | Status: AC
  Administered 2011-04-27: 1 [in_us] via TOPICAL
  Filled 2011-04-27: qty 1

## 2011-04-27 MED ORDER — LEVETIRACETAM 500 MG PO TABS
250.0000 mg | ORAL_TABLET | Freq: Two times a day (BID) | ORAL | Status: DC
  Administered 2011-04-27 – 2011-04-30 (×5): 250 mg via ORAL
  Filled 2011-04-27 (×5): qty 1

## 2011-04-27 MED ORDER — ASPIRIN 81 MG PO CHEW
324.0000 mg | CHEWABLE_TABLET | Freq: Once | ORAL | Status: AC
  Administered 2011-04-27: 324 mg via ORAL
  Filled 2011-04-27: qty 4

## 2011-04-27 MED ORDER — FLUOXETINE HCL 20 MG PO CAPS
20.0000 mg | ORAL_CAPSULE | Freq: Every day | ORAL | Status: DC
  Administered 2011-04-28 – 2011-04-30 (×3): 20 mg via ORAL
  Filled 2011-04-27 (×3): qty 1

## 2011-04-27 MED ORDER — FOLIC ACID 1 MG PO TABS
1.0000 mg | ORAL_TABLET | Freq: Every day | ORAL | Status: DC
  Administered 2011-04-28 – 2011-04-30 (×3): 1 mg via ORAL
  Filled 2011-04-27 (×3): qty 1

## 2011-04-27 NOTE — ED Notes (Signed)
CRITICAL VALUE ALERT  Critical value received:  Trop. 0.40  Date of notification:  04/27/11  Time of notification:  1652  Critical value read back:yes  Nurse who received alert:  Eustace Quail RN  MD notified (1st page):  Dr Estell Harpin  Time of first page:  1652  MD notified (2nd page):  Time of second page:  Responding MD:    Time MD responded:

## 2011-04-27 NOTE — ED Provider Notes (Signed)
History  This chart was scribed for Tiffany Lennert, MD by Bennett Scrape. This patient was seen in room APA10/APA10 and the patient's care was started at 4:26PM.  CSN: [id number]  Arrival date & time 04/27/11  1546   First MD Initiated Contact with Patient 04/27/11 1605      Chief Complaint  Patient presents with  . Chest Pain  . Shortness of Breath     Patient is a 78 y.o. female presenting with chest pain. The history is provided by the patient. No language interpreter was used.  Chest Pain The chest pain began yesterday. Chest pain occurs constantly. The chest pain is worsening. The severity of the pain is moderate. The quality of the pain is described as tightness. The pain radiates to the right shoulder and right neck. Chest pain is worsened by certain positions and deep breathing. Primary symptoms include shortness of breath and dizziness. Pertinent negatives for primary symptoms include no fever, no fatigue, no cough, no abdominal pain and no nausea.  Dizziness does not occur with nausea or diaphoresis.  Pertinent negatives for associated symptoms include no diaphoresis.  Pertinent negatives for past medical history include no CAD, no MI, no PE and no seizures.      Tiffany Hartman is a 78 y.o. female who presents to the Emergency Department complaining of 24 hours of gradual onset, gradually worsening, constant right-sided chest pain described tightness that started after she ate some nuts. The pain radiates to the right neck and right shoulder. She lists SOB and chills as associated factors. The pain is worse with movement, laying down and deep breathing. Pt states that she thought it originally was gas so she took some Tums. She states that she had mild relief and decided to got to bed. She became concerned when she woke up this morning with the pain. She reports taking 2 muscle relaxers with moderate improvement in symptoms.   was evaluated by a cardiologist years ago, has  bronchitis-states that she took a breathing treatment this morning  She denies nausea, diaphoresis, abdominal pain and palpitations as associated symptoms.  admitted years ago with similar pain with pneumonia, pain is worse than that episode, She has a h/o parkinson's and dementia. She denies smoking and alcohol use.  Pt's PCP is Dr. [name].  Past Medical History  Diagnosis Date  . Parkinson disease   . Dementia     History reviewed. No pertinent past surgical history.  No family history on file. Pt lives with daughter.  History  Substance Use Topics  . Smoking status: Never Smoker   . Smokeless tobacco: Not on file  . Alcohol Use: No    Review of Systems  Constitutional: Positive for chills. Negative for fever, diaphoresis and fatigue.  HENT: Negative for congestion, sinus pressure and ear discharge.   Eyes: Negative for discharge.  Respiratory: Positive for shortness of breath. Negative for cough.   Cardiovascular: Positive for chest pain.  Gastrointestinal: Negative for nausea, abdominal pain and diarrhea.  Genitourinary: Negative for frequency and hematuria.  Musculoskeletal: Negative for back pain.  Skin: Negative for rash.  Neurological: Positive for dizziness. Negative for seizures and headaches.  Hematological: Negative.   Psychiatric/Behavioral: Negative for hallucinations.    Allergies  Review of patient's allergies indicates not on file.  Home Medications  No current outpatient prescriptions on file.  Triage Vitals: BP 135/76  Pulse 70  Temp(Src) 98 F (36.7 C) (Oral)  Resp 28  Ht 5\' 1"  (1.549 m)  Wt 229 lb (103.874 kg)  BMI 43.27 kg/m2  SpO2 96%  Physical Exam  Nursing note and vitals reviewed. Constitutional: She is oriented to person, place, and time. She appears well-developed and well-nourished.  HENT:  Head: Normocephalic and atraumatic.  Eyes: Conjunctivae and EOM are normal.  Neck: Normal range of motion. Neck supple.  Cardiovascular:  Normal rate and regular rhythm.   Murmur heard.  Systolic murmur is present with a grade of 2/6  Pulmonary/Chest: Effort normal and breath sounds normal. No respiratory distress. She exhibits tenderness.       Tender to the right lateral chest over the right breast  Abdominal: Soft. Bowel sounds are normal. There is no tenderness.  Musculoskeletal: Normal range of motion. She exhibits edema.       1+ edema to the lower ankles   Neurological: She is alert and oriented to person, place, and time. No cranial nerve deficit.  Skin: Skin is warm and dry.  Psychiatric: She has a normal mood and affect. Her behavior is normal.    ED Course  Procedures (including critical care time)  DIAGNOSTIC STUDIES: Oxygen Saturation is 96% on room air, adequate by my interpretation.    COORDINATION OF CARE: 4:34PM-Discussed lab work and chest x-ray with pt and pt agreed to plan. 5:57PM-Pt rechecked and is still having pain in neck. Discussed lab work and need for admission and pt agreed. Advised pt of consult with Dr. [name].   Labs Reviewed  CBC - Abnormal; Notable for the following:    Hemoglobin 11.8 (*)    All other components within normal limits  COMPREHENSIVE METABOLIC PANEL - Abnormal; Notable for the following:    Potassium 2.9 (*)    Glucose, Bld 134 (*)    Albumin 3.4 (*)    Total Bilirubin 0.2 (*)    GFR calc non Af Amer 58 (*)    GFR calc Af Amer 68 (*)    All other components within normal limits  TROPONIN I - Abnormal; Notable for the following:    Troponin I 0.40 (*)    All other components within normal limits  DIFFERENTIAL  URINALYSIS, ROUTINE W REFLEX MICROSCOPIC   Dg Chest Portable 1 View  04/27/2011  *RADIOLOGY REPORT*  Clinical Data: Chest pain and shortness of breath.  PORTABLE CHEST - 1 VIEW  Comparison: 09/22/2009  Findings: The heart is normal in size accounting for focal position.  No focal consolidations or pleural effusions.  No edema. There are moderate degenerative  changes in the spine.  There are chronic changes in both shoulders, stable. Surgical clips are identified within the left axilla.  IMPRESSION: No evidence for acute cardiopulmonary abnormality.  Original Report Authenticated By: Patterson Hammersmith, M.D.     No diagnosis found.   Date: 04/27/2011  Rate:75  Rhythm: normal sinus rhythm  QRS Axis: normal  Intervals: normal  ST/T Wave abnormalities: normal  Poor r wave progression  Conduction Disutrbances:none  Narrative Interpretation:   Old EKG Reviewed: unchanged    MDM       The chart was scribed for me under my direct supervision.  I personally performed the history, physical, and medical decision making and all procedures in the evaluation of this patient.Tiffany Lennert, MD 04/27/11 Paulo Fruit

## 2011-04-27 NOTE — ED Notes (Signed)
Right side cp that started yesterday, pain now radiates to left and sob, dizzy at times.

## 2011-04-28 DIAGNOSIS — F0280 Dementia in other diseases classified elsewhere without behavioral disturbance: Secondary | ICD-10-CM | POA: Diagnosis not present

## 2011-04-28 DIAGNOSIS — G3183 Dementia with Lewy bodies: Secondary | ICD-10-CM | POA: Diagnosis not present

## 2011-04-28 DIAGNOSIS — E876 Hypokalemia: Secondary | ICD-10-CM | POA: Diagnosis not present

## 2011-04-28 DIAGNOSIS — R079 Chest pain, unspecified: Secondary | ICD-10-CM | POA: Diagnosis not present

## 2011-04-28 LAB — BASIC METABOLIC PANEL
Calcium: 8.7 mg/dL (ref 8.4–10.5)
GFR calc Af Amer: 68 mL/min — ABNORMAL LOW (ref >90)
GFR calc non Af Amer: 58 mL/min — ABNORMAL LOW (ref >90)
Glucose, Bld: 122 mg/dL — ABNORMAL HIGH (ref 70–99)
Potassium: 3.6 mEq/L (ref 3.5–5.1)
Sodium: 141 mEq/L (ref 135–145)

## 2011-04-28 LAB — GLUCOSE, CAPILLARY
Glucose-Capillary: 107 mg/dL — ABNORMAL HIGH (ref 70–99)
Glucose-Capillary: 111 mg/dL — ABNORMAL HIGH (ref 70–99)

## 2011-04-28 LAB — CARDIAC PANEL(CRET KIN+CKTOT+MB+TROPI)
CK, MB: 2.1 ng/mL (ref 0.3–4.0)
Relative Index: 2 (ref 0.0–2.5)
Total CK: 105 U/L (ref 7–177)
Troponin I: <0.3 ng/mL (ref <0.30)

## 2011-04-28 LAB — PRO B NATRIURETIC PEPTIDE: Pro B Natriuretic peptide (BNP): 196.5 pg/mL (ref 0–450)

## 2011-04-28 MED ORDER — SODIUM CHLORIDE 0.45 % IV SOLN
INTRAVENOUS | Status: DC
  Administered 2011-04-28 – 2011-04-29 (×2): via INTRAVENOUS
  Filled 2011-04-28 (×3): qty 1000

## 2011-04-28 MED ORDER — SODIUM CHLORIDE 0.9 % IJ SOLN
INTRAMUSCULAR | Status: AC
  Administered 2011-04-29: 10:00:00
  Filled 2011-04-28: qty 3

## 2011-04-28 NOTE — Progress Notes (Signed)
Tiffany Hartman, GASTER                  ACCOUNT NO.:  [id number]  MEDICAL RECORD NO.:  [id number]  LOCATION:  A326                          FACILITY:  [facility]  PHYSICIAN:  Riaz Onorato D. [name], MD   DATE OF BIRTH:  Jun 19, 1927  DATE OF PROCEDURE:  04/28/2011 DATE OF DISCHARGE:                                PROGRESS NOTE   SUBJECTIVE:  The patient feels better.  Her chest pain has improved. Her breathing is also improving.  OBJECTIVE:  GENERAL:  The patient is alert, awake, and sick looking. VITAL SIGNS:  Blood pressure 109/58, pulse 69, respiratory rate 16, and temperature 98 degrees Fahrenheit. CHEST:  Clear lung fields.  Good air entry. CARDIOVASCULAR:  First and second heart sounds heard.  No murmur.  No gallop. ABDOMEN:  Soft and lax.  Bowel sound is positive.  No mass or organomegaly. EXTREMITIES:  No leg edema.  LABORATORY DATA:  Total CPK 105, CK-MB 2.1, troponin less than 0.3.  ASSESSMENT: 1. Chest pain, probably noncardiac. 2. Hypokalemia.  The patient is being replaced. 3. Parkinson disease. 4. Dementia. 5. Hypertension.  PLAN:  We will continue the patient on serial EKG, cardiac enzymes. Continue telemetry.  We will continue to replace her potassium.  We will do BMP and continue supportive care.     Jaylin Benzel D. [name], MD     TDF/MEDQ  D:  04/28/2011  T:  04/28/2011  Job:  [id number]

## 2011-04-28 NOTE — H&P (Signed)
Hartman, Tiffany                  ACCOUNT NO.:  [id number]  MEDICAL RECORD NO.:  [id number]  LOCATION:  A326                          FACILITY:  [facility]  PHYSICIAN:  Yisel Megill D. [name], MD   DATE OF BIRTH:  15-Oct-1927  DATE OF ADMISSION:  04/27/2011 DATE OF DISCHARGE:  LH                             HISTORY & PHYSICAL   CHIEF COMPLAINT:  Chest pain and shortness of breath.  HISTORY OF PRESENT ILLNESS:  This is an 78 year old female patient with a history of multiple medical illnesses who came to the emergency room with above complaint.  Her symptoms started 24 hours before admission. The pain was on the left side of the chest which was progressively getting worse.  She had also associated shortness of breath.  No nausea, vomiting, or diaphoresis.  The patient was brought to the emergency room and she was evaluated.  Her initial cardiac enzymes showed mild elevation of troponin.  There was no change on her EKG.  The patient had significant hypokalemia.  She was started on potassium replacement and the patient was admitted for further evaluation and treatment.  REVIEW OF SYSTEMS:  The patient has no fever, chills, cough, nausea, vomiting, abdominal pain, dysuria, urgency, or frequency of urination.  PAST MEDICAL HISTORY: 1. Parkinson disease. 2. Dementia. 3. Degenerative joint disease. 4. History of transient ischemic attack. 5. Hypertension. 6. Hyperlipidemia.  CURRENT MEDICATIONS: 1. Xanax 0.25 mg b.i.d. 2. Amlodipine 5 mg daily. 3. Aspirin 81 mg daily. 4. Calcium carbonate with vitamin D 1 tablet daily. 5. Aricept 5 mg daily. 6. Ferrous sulfate 325 mg daily. 7. Prozac 20 mg daily. 8. Folic acid 1 mg daily. 9. Lasix 20 mg daily. 10.Gabapentin 300 mg daily. 11.Vicodin 5/500 one tablet b.i.d. p.r.n. 12.Keppra 250 mg b.i.d. 13.Mevacor 10 mg daily. 14.Antivert 25 mg q.6 h p.r.n. 15.Benicar 40 mg p.o. daily. 16.Omeprazole 20 mg daily.  SOCIAL HISTORY:  The patient has no  history of alcohol, tobacco, or substance abuse.  FAMILY HISTORY:  No history of diabetes, hypertension, or known cancer disease in the family.  PHYSICAL EXAMINATION:  GENERAL:  The patient is alert, awake, and chronically sick looking. VITAL SIGNS:  Blood pressure 109/58, pulse 59, respiratory rate 16, and temperature 98 degrees Fahrenheit. HEENT:  Pupils are equal and reactive. NECK:  Supple. CHEST:  Decreased air entry, few rhonchi. CARDIOVASCULAR:  First and second heart sounds heard.  No murmur.  No gallop. ABDOMEN:  Soft and lax.  Bowel sound is positive.  No mass or organomegaly. EXTREMITIES:  No leg edema.  LABORATORY DATA ON ADMISSION:  CBC:  WBC 47.2, hemoglobin 11.8, hematocrit 36.5, and platelets 320.  Sodium 138, potassium 2.9, chloride 102, carbon dioxide 27, glucose 134, BUN 13, creatinine 0.9, and calcium 9.2.  Urinalysis is negative.  Initial cardiac enzymes showed a troponin of 0.40.  ASSESSMENT: 1. Chest pain, to rule out acute myocardial infarction. 2. History of Parkinson disease. 3. Dementia. 4. Hypokalemia. 5. Degenerative joint disease. 6. History of hyperlipidemia.  PLAN:  We will replace her potassium.  We will continue serial EKG, cardiac enzymes.  We will monitor her BMP.  We will continue her regular  medications.     Azarria Balint D. [name], MD     TDF/MEDQ  D:  04/28/2011  T:  04/28/2011  Job:  [id number]

## 2011-04-29 LAB — GLUCOSE, CAPILLARY
Glucose-Capillary: 97 mg/dL (ref 70–99)
Glucose-Capillary: 138 mg/dL — ABNORMAL HIGH (ref 70–99)

## 2011-04-29 MED ORDER — POTASSIUM CHLORIDE CRYS ER 20 MEQ PO TBCR
20.0000 meq | EXTENDED_RELEASE_TABLET | Freq: Every day | ORAL | Status: DC
  Administered 2011-04-29 – 2011-04-30 (×2): 20 meq via ORAL
  Filled 2011-04-29 (×2): qty 1

## 2011-04-29 MED ORDER — TRAMADOL HCL 50 MG PO TABS
50.0000 mg | ORAL_TABLET | Freq: Two times a day (BID) | ORAL | Status: DC | PRN
  Administered 2011-04-29: 50 mg via ORAL
  Filled 2011-04-29: qty 1

## 2011-04-29 MED ORDER — SODIUM CHLORIDE 0.9 % IJ SOLN
INTRAMUSCULAR | Status: AC
  Administered 2011-04-29: 17:00:00
  Filled 2011-04-29: qty 3

## 2011-04-29 NOTE — Progress Notes (Signed)
UR Chart Review Completed  

## 2011-04-29 NOTE — Progress Notes (Signed)
Tiffany Hartman, BAME                  ACCOUNT NO.:  [id number]  MEDICAL RECORD NO.:  [id number]  LOCATION:  A326                          FACILITY:  [facility]  PHYSICIAN:  Cristoval Teall D. [name], MD   DATE OF BIRTH:  06/03/1927  DATE OF PROCEDURE:  04/29/2011 DATE OF DISCHARGE:                                PROGRESS NOTE   SUBJECTIVE:  The patient is complaining of right shoulder pain.  Her chest pain has improved.  OBJECTIVE:  GENERAL:  The patient is alert, awake, and sick looking. VITAL SIGNS:  Blood pressure 135/63, pulse 66, respiratory rate 18, temperature 97.6 degrees Fahrenheit. CHEST:  Clear lung fields.  Good air entry. CARDIOVASCULAR SYSTEM:  First and second heart sounds heard.  No murmur. No gallop. ABDOMEN:  Soft and lax.  Bowel sound is positive.  No mass or organomegaly. EXTREMITIES:  Tenderness of the right shoulder on motion.  LABORATORY DATA:  Sodium 141, potassium 3.6, chloride 106, carbon dioxide 24, glucose 122, BUN 13, creatinine 0.9, and calcium 8.7.  ASSESSMENT: 1. Chest pain, clinically improving. 2. Hypokalemia, corrected. 3. Right shoulder pain, probably secondary to degenerative joint     disease. 4. Parkinson disease. 5. Diabetes mellitus. 6. Hypertension.  PLAN:  We will continue potassium supplement.  We will start the patient on tramadol for the pain.  We will repeat BMP in a.m.  Continue regular medications.     Nashton Belson D. [name], MD     TDF/MEDQ  D:  04/29/2011  T:  04/29/2011  Job:  [id number]

## 2011-04-30 LAB — BASIC METABOLIC PANEL
CO2: 28 mEq/L (ref 19–32)
Chloride: 107 mEq/L (ref 96–112)
Glucose, Bld: 115 mg/dL — ABNORMAL HIGH (ref 70–99)
Sodium: 143 mEq/L (ref 135–145)

## 2011-04-30 LAB — GLUCOSE, CAPILLARY: Glucose-Capillary: 95 mg/dL (ref 70–99)

## 2011-04-30 MED ORDER — POTASSIUM CHLORIDE CRYS ER 20 MEQ PO TBCR: 10.0000 meq | EXTENDED_RELEASE_TABLET | Freq: Every day | ORAL | Status: DC

## 2011-04-30 NOTE — Progress Notes (Signed)
   CARE MANAGEMENT NOTE 04/30/2011  Patient:  Tiffany Hartman, Tiffany Hartman   Account Number:  [id number]  Date Initiated:  04/30/2011  Documentation initiated by:  Anibal Henderson  Subjective/Objective Assessment:   Pt  admitted with chest pain. Lives at home with daughter, and is fairly independent. has other family active in her care as needed. States she will return home at D/C     Action/Plan:   Pt is being D/C home today. No needs identified   Anticipated DC Date:  04/30/2011   Anticipated DC Plan:  HOME/SELF CARE      DC Planning Services  CM consult      Choice offered to / List presented to:             Status of service:  Completed, signed off Medicare Important Message given?   (If response is "NO", the following Medicare IM given date fields will be blank) Date Medicare IM given:   Date Additional Medicare IM given:    Discharge Disposition:  HOME/SELF CARE  Per UR Regulation:  Reviewed for med. necessity/level of care/duration of stay  If discussed at Long Length of Stay Meetings, dates discussed:    Comments:  04/30/11 0920 Anibal Henderson RN

## 2011-04-30 NOTE — Discharge Summary (Signed)
Tiffany Hartman, Tiffany Hartman                  ACCOUNT NO.:  [id number]  MEDICAL RECORD NO.:  [id number]  LOCATION:  A326                          FACILITY:  [facility]  PHYSICIAN:  Haniyah Maciolek D. [name], MD   DATE OF BIRTH:  [birth year]  DATE OF ADMISSION:  04/27/2011 DATE OF DISCHARGE:  04/24/2013LH                              DISCHARGE SUMMARY   DISCHARGE DIAGNOSES: 1. Chest pain, probably noncardiac. 2. Hypokalemia. 3. History of Parkinson disease. 4. Dementia. 5. Degenerative joint disease. 6. Hyperlipidemia.  DISCHARGE MEDICATIONS: 1. KCl 10 mEq p.o. daily. 2. Xanax 0.25 mg b.i.d. 3. Norvasc 5 mg daily. 4. Aspirin 81 mg daily. 5. Calcium carbonate with vitamin D 1 tablet p.o. daily. 6. Aricept 5 mg daily. 7. Ferrous sulfate 325 mg daily. 8. Prozac 20 mg daily. 9. Folic acid 1 mg daily. 10.Lasix 20 mg daily. 11.Gabapentin 300 mg daily. 12.Vicodin 5/500 one tablet b.i.d. 13.Keppra 250 b.i.d. 14.Lovastatin 10 mg daily. 15.Meclizine 25 mg q.6 p.r.n. 16.Benicar 40 mg daily. 17.Omeprazole 20 mg daily.  DISPOSITION:  The patient will be discharged to home in stable condition.  DISCHARGE INSTRUCTION:  The patient will continue her regular medications.  She will be on 10 mEq of potassium daily.  She will be followed in the office in 1 week duration.  LABORATORY DATA:  On discharge, BMP:  Sodium 141, potassium 3.6, chloride 106, carbon dioxide 24, glucose 122, BUN 13, creatinine 0.9. Cardiac enzymes:  CPK total 105, CK-MB 2.1, and troponin less than 0.30.  HOSPITAL COURSE:  This is an 78 year old female patient with history of multiple medical illnesses, was admitted due to chest pain.  She was admitted under telemetry and serial EKG cardiac enzymes were done.  The patient was also found to have hypokalemia.  She was replaced with IV potassium.  The patient also treated symptomatically for her chest pain. Later, she started complaining of right shoulder pain.  Her cardiac enzymes and EKG  was negative for any acute coronary ischemia.  Over the hospital stay, the patient improved.  She is back to her baseline.  She is started on potassium supplement, and the patient is going to be discharged home.     Taraann Olthoff D. [name], MD     TDF/MEDQ  D:  04/30/2011  T:  04/30/2011  Job:  [id number]

## 2011-05-06 DIAGNOSIS — G20 Parkinson's disease: Secondary | ICD-10-CM | POA: Diagnosis not present

## 2011-05-06 DIAGNOSIS — E7800 Pure hypercholesterolemia, unspecified: Secondary | ICD-10-CM | POA: Diagnosis not present

## 2011-05-06 DIAGNOSIS — I10 Essential (primary) hypertension: Secondary | ICD-10-CM | POA: Diagnosis not present

## 2011-05-06 DIAGNOSIS — E119 Type 2 diabetes mellitus without complications: Secondary | ICD-10-CM | POA: Diagnosis not present

## 2011-05-06 DIAGNOSIS — Z0000 Encounter for general adult medical examination without abnormal findings: Secondary | ICD-10-CM | POA: Diagnosis not present

## 2011-07-16 ENCOUNTER — Emergency Department (HOSPITAL_COMMUNITY): Payer: Medicare Other

## 2011-07-16 ENCOUNTER — Encounter (HOSPITAL_COMMUNITY): Payer: Self-pay | Admitting: *Deleted

## 2011-07-16 ENCOUNTER — Emergency Department (HOSPITAL_COMMUNITY)
Admission: EM | Admit: 2011-07-16 | Discharge: 2011-07-16 | Disposition: A | Discharge status: Home / Self Care | Payer: Medicare Other | Attending: Emergency Medicine | Admitting: Emergency Medicine

## 2011-07-16 DIAGNOSIS — E119 Type 2 diabetes mellitus without complications: Secondary | ICD-10-CM | POA: Diagnosis not present

## 2011-07-16 DIAGNOSIS — G20 Parkinson's disease: Secondary | ICD-10-CM | POA: Diagnosis not present

## 2011-07-16 DIAGNOSIS — J40 Bronchitis, not specified as acute or chronic: Secondary | ICD-10-CM | POA: Insufficient documentation

## 2011-07-16 DIAGNOSIS — G20A1 Parkinson's disease without dyskinesia, without mention of fluctuations: Secondary | ICD-10-CM | POA: Insufficient documentation

## 2011-07-16 DIAGNOSIS — R071 Chest pain on breathing: Secondary | ICD-10-CM | POA: Diagnosis not present

## 2011-07-16 DIAGNOSIS — J45909 Unspecified asthma, uncomplicated: Secondary | ICD-10-CM | POA: Diagnosis not present

## 2011-07-16 DIAGNOSIS — F0390 Unspecified dementia without behavioral disturbance: Secondary | ICD-10-CM | POA: Insufficient documentation

## 2011-07-16 DIAGNOSIS — Z79899 Other long term (current) drug therapy: Secondary | ICD-10-CM | POA: Insufficient documentation

## 2011-07-16 DIAGNOSIS — R0789 Other chest pain: Secondary | ICD-10-CM

## 2011-07-16 DIAGNOSIS — R05 Cough: Secondary | ICD-10-CM | POA: Diagnosis not present

## 2011-07-16 DIAGNOSIS — J209 Acute bronchitis, unspecified: Secondary | ICD-10-CM | POA: Diagnosis not present

## 2011-07-16 DIAGNOSIS — R0602 Shortness of breath: Secondary | ICD-10-CM | POA: Insufficient documentation

## 2011-07-16 HISTORY — DX: Unspecified asthma, uncomplicated: J45.909

## 2011-07-16 HISTORY — DX: Bronchitis, not specified as acute or chronic: J40

## 2011-07-16 HISTORY — DX: Type 2 diabetes mellitus without complications: E11.9

## 2011-07-16 LAB — COMPREHENSIVE METABOLIC PANEL
Albumin: 3.6 g/dL (ref 3.5–5.2)
Alkaline Phosphatase: 65 U/L (ref 39–117)
BUN: 18 mg/dL (ref 6–23)
CO2: 24 mEq/L (ref 19–32)
Chloride: 106 mEq/L (ref 96–112)
GFR calc non Af Amer: 41 mL/min — ABNORMAL LOW (ref >90)
Potassium: 3.4 mEq/L — ABNORMAL LOW (ref 3.5–5.1)
Total Bilirubin: 0.3 mg/dL (ref 0.3–1.2)

## 2011-07-16 LAB — D-DIMER, QUANTITATIVE: D-Dimer, Quant: 0.84 ug/mL-FEU — ABNORMAL HIGH (ref 0.00–0.48)

## 2011-07-16 LAB — CBC WITH DIFFERENTIAL/PLATELET
Basophils Relative: 1 % (ref 0–1)
HCT: 37 % (ref 36.0–46.0)
Hemoglobin: 11.9 g/dL — ABNORMAL LOW (ref 12.0–15.0)
Lymphocytes Relative: 40 % (ref 12–46)
Lymphs Abs: 2.4 10*3/uL (ref 0.7–4.0)
MCHC: 32.2 g/dL (ref 30.0–36.0)
Monocytes Absolute: 0.3 10*3/uL (ref 0.1–1.0)
Monocytes Relative: 5 % (ref 3–12)
Neutro Abs: 3 10*3/uL (ref 1.7–7.7)
Neutrophils Relative %: 50 % (ref 43–77)
RBC: 4.18 MIL/uL (ref 3.87–5.11)
WBC: 6 10*3/uL (ref 4.0–10.5)

## 2011-07-16 LAB — CARDIAC PANEL(CRET KIN+CKTOT+MB+TROPI): Total CK: 151 U/L (ref 7–177)

## 2011-07-16 MED ORDER — DOXYCYCLINE HYCLATE 100 MG PO CAPS: 100.0000 mg | ORAL_CAPSULE | Freq: Two times a day (BID) | ORAL | Status: AC

## 2011-07-16 MED ORDER — HYDROCODONE-ACETAMINOPHEN 5-325 MG PO TABS
1.0000 | ORAL_TABLET | Freq: Once | ORAL | Status: AC
  Administered 2011-07-16: 1 via ORAL
  Filled 2011-07-16: qty 1

## 2011-07-16 MED ORDER — IPRATROPIUM BROMIDE 0.02 % IN SOLN
0.5000 mg | Freq: Once | RESPIRATORY_TRACT | Status: AC
  Administered 2011-07-16: 0.5 mg via RESPIRATORY_TRACT
  Filled 2011-07-16: qty 2.5

## 2011-07-16 MED ORDER — TRAMADOL HCL 50 MG PO TABS
50.0000 mg | ORAL_TABLET | Freq: Once | ORAL | Status: AC
  Administered 2011-07-16: 50 mg via ORAL
  Filled 2011-07-16: qty 1

## 2011-07-16 MED ORDER — ALBUTEROL SULFATE (5 MG/ML) 0.5% IN NEBU
2.5000 mg | INHALATION_SOLUTION | Freq: Once | RESPIRATORY_TRACT | Status: AC
  Administered 2011-07-16: 2.5 mg via RESPIRATORY_TRACT
  Filled 2011-07-16: qty 0.5

## 2011-07-16 MED ORDER — PREDNISONE 50 MG PO TABS: ORAL_TABLET | ORAL | Status: AC

## 2011-07-16 MED ORDER — ALBUTEROL SULFATE HFA 108 (90 BASE) MCG/ACT IN AERS: 1.0000 | INHALATION_SPRAY | Freq: Four times a day (QID) | RESPIRATORY_TRACT | Status: DC | PRN

## 2011-07-16 MED ORDER — METHYLPREDNISOLONE SODIUM SUCC 125 MG IJ SOLR
125.0000 mg | Freq: Once | INTRAMUSCULAR | Status: AC
  Administered 2011-07-16: 125 mg via INTRAVENOUS
  Filled 2011-07-16: qty 2

## 2011-07-16 MED ORDER — IOHEXOL 350 MG/ML SOLN
100.0000 mL | Freq: Once | INTRAVENOUS | Status: AC | PRN
  Administered 2011-07-16: 100 mL via INTRAVENOUS

## 2011-07-16 NOTE — ED Provider Notes (Signed)
History   This chart was scribed for Glynn Octave, MD by Shari Heritage. The patient was seen in room APA04/APA04. Patient's care was started at 1824.     CSN: [id number]  Arrival date & time 07/16/11  4098   First MD Initiated Contact with Patient 07/16/11 1830      Chief Complaint  Patient presents with  . Shortness of Breath    (Consider location/radiation/quality/duration/timing/severity/associated sxs/prior treatment) Patient is a 78 y.o. female presenting with shortness of breath. The history is provided by the patient and a relative. No language interpreter was used.  Shortness of Breath  The current episode started 2 days ago. The problem occurs continuously. The problem has been gradually worsening. Nothing relieves the symptoms. Associated symptoms include chest pain, cough, shortness of breath and wheezing. Pertinent negatives include no fever. Nothing relieves the cough. There was no intake of a foreign body. She has not inhaled smoke recently. Her past medical history is significant for past wheezing. Her past medical history does not include asthma. She has been less active. Urine output has been normal.   Tiffany Hartman is a 78 y.o. female who presents to the Emergency Department complaining of SOB with associated right-sided chest pain, cough and wheezing onset 2 days ago. Patient says she had been experiencing chest pain intermittently, but the pain became constant today. Patient's cough has gotten worse over the past two days. Patient uses a nebulizer machine at home, but does not use oxygen at home. Patient denies abdominal pain and other symptoms. Patient has taken Prednisone for bronchitis in the past, but she reports that it has negative effects on glaucoma. Patient has a h/o bronchitis. Patient with h/o diabetes. Patient checks sugars at home 2x day. Patient also with h/o Parkinson's disease, asthma and dementia. Patient with surgical h/o abdominal hysterectomy, joint  replacement, fracture surgery and mastectomy. Patient has never smoked.  Past Medical History  Diagnosis Date  . Dementia   . Bronchitis   . Asthma   . Diabetes mellitus   . Parkinson disease     Past Surgical History  Procedure Date  . Abdominal hysterectomy   . Joint replacement   . Fracture surgery   . Mastectomy     History reviewed. No pertinent family history.  History  Substance Use Topics  . Smoking status: Never Smoker   . Smokeless tobacco: Not on file  . Alcohol Use: No    OB History    Grav Para Term Preterm Abortions TAB SAB Ect Mult Living                  Review of Systems  Constitutional: Negative for fever.  Respiratory: Positive for cough, shortness of breath and wheezing.   Cardiovascular: Positive for chest pain.  All other systems reviewed and are negative.    Allergies  Review of patient's allergies indicates no known allergies.  Home Medications   Current Outpatient Rx  Name Route Sig Dispense Refill  . ALPRAZOLAM 0.25 MG PO TABS Oral Take 0.25 mg by mouth 2 (two) times daily.    Marland Kitchen AMLODIPINE BESYLATE 5 MG PO TABS Oral Take 5 mg by mouth daily.    . ASPIRIN EC 81 MG PO TBEC Oral Take 81 mg by mouth daily.    . AZITHROMYCIN 250 MG PO TABS Oral Take 250 mg by mouth daily. Take two tablets by mouth on day 1, then take one tablet daily for 4 days    . CALCIUM CARBONATE-VITAMIN  D 600-200 MG-UNIT PO TABS Oral Take 1 tablet by mouth every evening.     . DONEPEZIL HCL 5 MG PO TABS Oral Take 5 mg by mouth daily.    Marland Kitchen FERROUS SULFATE 325 (65 FE) MG PO TABS Oral Take 325 mg by mouth daily.    Marland Kitchen FLUOXETINE HCL 20 MG PO CAPS Oral Take 20 mg by mouth daily.    Marland Kitchen FOLIC ACID 1 MG PO TABS Oral Take 1 mg by mouth daily.    . FUROSEMIDE 20 MG PO TABS Oral Take 20 mg by mouth daily.    Marland Kitchen GABAPENTIN 300 MG PO CAPS Oral Take 300 mg by mouth every evening.     Marland Kitchen HYDROCODONE-ACETAMINOPHEN 5-500 MG PO TABS Oral Take 1 tablet by mouth 2 (two) times daily.      Marland Kitchen LEVETIRACETAM 250 MG PO TABS Oral Take 250 mg by mouth 2 (two) times daily.    Marland Kitchen LOVASTATIN 10 MG PO TABS Oral Take 10 mg by mouth at bedtime.    Marland Kitchen MECLIZINE HCL 25 MG PO TABS Oral Take 25 mg by mouth every 6 (six) hours as needed. dizziness    . OLMESARTAN MEDOXOMIL 40 MG PO TABS Oral Take 40 mg by mouth daily.    Marland Kitchen OMEPRAZOLE 20 MG PO CPDR Oral Take 20 mg by mouth daily.    Marland Kitchen POTASSIUM CHLORIDE CRYS ER 20 MEQ PO TBCR Oral Take 0.5 tablets (10 mEq total) by mouth daily. 30 tablet 3    BP 131/105  Pulse 87  Temp 98.3 F (36.8 C) (Oral)  Resp 32  Ht 5\' 2"  (1.575 m)  Wt 226 lb (102.513 kg)  BMI 41.34 kg/m2  SpO2 100%  Physical Exam  Nursing note and vitals reviewed. Constitutional: She is oriented to person, place, and time. She appears well-developed and well-nourished.  HENT:  Head: Normocephalic and atraumatic.  Eyes: Conjunctivae and EOM are normal. Pupils are equal, round, and reactive to light.  Neck: Normal range of motion. Neck supple.  Cardiovascular: Normal rate and regular rhythm.   Pulmonary/Chest: Effort normal. She has wheezes. She has rhonchi. She exhibits tenderness.       Moderate air exchange with high pitched expiratory wheezes and rhonchi. Reproducible right-sided chest tenderness underneath breast.  Abdominal: Soft. Bowel sounds are normal.  Musculoskeletal: Normal range of motion.       +2 pitting edema.    Neurological: She is alert and oriented to person, place, and time.  Skin: Skin is warm and dry.  Psychiatric: She has a normal mood and affect.    ED Course  Procedures (including critical care time) DIAGNOSTIC STUDIES: Oxygen Saturation is 95% on room air, adequate by my interpretation.    COORDINATION OF CARE: 6:33PM- Patient informed of current plan for treatment and evaluation and agrees with plan at this time. Have ordered breathing treatments, chest X-ray and labs.  8:22PM- PUpon recheck, patient says she feels better. Patient's breathing  has improved. Updated patient on lab and imaging results. Informed patient that she has a nodule on thyroid that needs to be followed by Dr. [name]. Patient has no blood clots.   Results for orders placed during the hospital encounter of 07/16/11  CBC WITH DIFFERENTIAL      Component Value Range   WBC 6.0  4.0 - 10.5 K/uL   RBC 4.18  3.87 - 5.11 MIL/uL   Hemoglobin 11.9 (*) 12.0 - 15.0 g/dL   HCT 16.1  09.6 - 04.5 %  MCV 88.5  78.0 - 100.0 fL   MCH 28.5  26.0 - 34.0 pg   MCHC 32.2  30.0 - 36.0 g/dL   RDW 14.7  82.9 - 56.2 %   Platelets 307  150 - 400 K/uL   Neutrophils Relative 50  43 - 77 %   Neutro Abs 3.0  1.7 - 7.7 K/uL   Lymphocytes Relative 40  12 - 46 %   Lymphs Abs 2.4  0.7 - 4.0 K/uL   Monocytes Relative 5  3 - 12 %   Monocytes Absolute 0.3  0.1 - 1.0 K/uL   Eosinophils Relative 3  0 - 5 %   Eosinophils Absolute 0.2  0.0 - 0.7 K/uL   Basophils Relative 1  0 - 1 %   Basophils Absolute 0.0  0.0 - 0.1 K/uL  COMPREHENSIVE METABOLIC PANEL      Component Value Range   Sodium 140  135 - 145 mEq/L   Potassium 3.4 (*) 3.5 - 5.1 mEq/L   Chloride 106  96 - 112 mEq/L   CO2 24  19 - 32 mEq/L   Glucose, Bld 96  70 - 99 mg/dL   BUN 18  6 - 23 mg/dL   Creatinine, Ser 1.30 (*) 0.50 - 1.10 mg/dL   Calcium 9.3  8.4 - 86.5 mg/dL   Total Protein 7.1  6.0 - 8.3 g/dL   Albumin 3.6  3.5 - 5.2 g/dL   AST 13  0 - 37 U/L   ALT 10  0 - 35 U/L   Alkaline Phosphatase 65  39 - 117 U/L   Total Bilirubin 0.3  0.3 - 1.2 mg/dL   GFR calc non Af Amer 41 (*) >90 mL/min   GFR calc Af Amer 48 (*) >90 mL/min  CARDIAC PANEL(CRET KIN+CKTOT+MB+TROPI)      Component Value Range   Total CK 151  7 - 177 U/L   CK, MB 2.7  0.3 - 4.0 ng/mL   Troponin I <0.30  <0.30 ng/mL   Relative Index 1.8  0.0 - 2.5  D-DIMER, QUANTITATIVE      Component Value Range   D-Dimer, Quant 0.84 (*) 0.00 - 0.48 ug/mL-FEU   Ct Angio Chest W/cm &/or Wo Cm  07/16/2011  *RADIOLOGY REPORT*  Clinical Data: Short of breath  CT  ANGIOGRAPHY CHEST  Technique:  Multidetector CT imaging of the chest using the standard protocol during bolus administration of intravenous contrast. Multiplanar reconstructed images including MIPs were obtained and reviewed to evaluate the vascular anatomy.  Contrast: OMNIPAQUE IOHEXOL 350 MG/ML SOLN  Comparison: None.  Findings: No filling defects in the pulmonary arterial tree to suggest acute pulmonary thromboembolism.  2.0 cm right thyroid hypodensity.  No evidence of abnormal mediastinal adenopathy.  Coronary artery calcifications are noted. Mild atherosclerotic changes of the descending aorta.  No pericardial effusion.  No pleural effusion.  No pneumothorax.  Subtle patchy ground-glass in the right upper lobe on image 20. Dependent atelectasis.  No destructive bone lesion.  Focal area of low density in the gallbladder.  Tiny gallstones cannot be excluded.  IMPRESSION: No evidence of acute pulmonary thromboembolism.  2 cm left right thyroid hypodensity.  Thyroid ultrasound is recommended when feasible.  Possible gallstones.  Original Report Authenticated By: Donavan Burnet, M.D.   Dg Chest Portable 1 View  07/16/2011  *RADIOLOGY REPORT*  Clinical Data: Short of breath and cough  PORTABLE CHEST - 1 VIEW  Comparison: 04/27/2011  Findings: Cardiac enlargement without  heart failure.  Negative for pneumonia or effusion.  Lungs are clear. Chronic degenerative changes in both shoulder joints.  IMPRESSION: No acute cardiopulmonary disease.  Original Report Authenticated By: Camelia Phenes, M.D.       No diagnosis found.    MDM  2 days of cough, wheezing, SOB, R sided chest pain.  Alert, no distress, vitals stable. Bilateral rhonchi with expiratory wheezing.  Reproducible R sided chest pain.  Work of breathing and wheezing much improved after nebulizer. Chest x-ray negative. Patient reports right sided chest pain is similar to what she was admitted for April. She was ruled out for MI with cardiac  enzymes and did not have a stress test. Her pain is reproducible and appears to be atypical for ACS.  CT negative for PE.  Patient informed of possible thyroid nodule.  Chest pain is R sided and atypical for ACS. Ambulatory in the ED without desaturation or dyspnea. Will treat for bronchitis with nebs, steroids, doxycycline.  PCP follow up this week.   Date: 07/16/2011  Rate: 87  Rhythm: normal sinus rhythm and premature atrial contractions (PAC)  QRS Axis: left  Intervals: normal  ST/T Wave abnormalities: normal  Conduction Disutrbances:none  Narrative Interpretation:   Old EKG Reviewed: unchanged     I personally performed the services described in this documentation, which was scribed in my presence.  The recorded information has been reviewed and considered.    Glynn Octave, MD 07/16/11 [phone]

## 2011-07-16 NOTE — ED Notes (Signed)
Patient ambulated without difficulty. Oxygen saturations maintained between 94%-96%. No needs voiced at this time.

## 2011-07-16 NOTE — ED Notes (Signed)
Sob, cough, wheeze.  Alert talking

## 2011-07-16 NOTE — ED Notes (Signed)
Family at bedside. 

## 2011-08-12 ENCOUNTER — Other Ambulatory Visit (HOSPITAL_COMMUNITY): Payer: Self-pay | Admitting: Internal Medicine

## 2011-08-12 DIAGNOSIS — E119 Type 2 diabetes mellitus without complications: Secondary | ICD-10-CM | POA: Diagnosis not present

## 2011-08-12 DIAGNOSIS — E079 Disorder of thyroid, unspecified: Secondary | ICD-10-CM

## 2011-08-12 DIAGNOSIS — E039 Hypothyroidism, unspecified: Secondary | ICD-10-CM | POA: Diagnosis not present

## 2011-08-12 DIAGNOSIS — E041 Nontoxic single thyroid nodule: Secondary | ICD-10-CM | POA: Diagnosis not present

## 2011-08-12 DIAGNOSIS — R109 Unspecified abdominal pain: Secondary | ICD-10-CM | POA: Diagnosis not present

## 2011-08-18 ENCOUNTER — Ambulatory Visit (HOSPITAL_COMMUNITY)
Admission: RE | Admit: 2011-08-18 | Discharge: 2011-08-18 | Disposition: A | Discharge status: Home / Self Care | Payer: Medicare Other | Source: Ambulatory Visit | Attending: Internal Medicine | Admitting: Internal Medicine

## 2011-08-18 ENCOUNTER — Emergency Department (HOSPITAL_COMMUNITY): Payer: Medicare Other

## 2011-08-18 ENCOUNTER — Encounter (HOSPITAL_COMMUNITY): Payer: Self-pay | Admitting: Emergency Medicine

## 2011-08-18 ENCOUNTER — Emergency Department (HOSPITAL_COMMUNITY)
Admission: EM | Admit: 2011-08-18 | Discharge: 2011-08-18 | Disposition: A | Discharge status: Home / Self Care | Payer: Medicare Other | Attending: Emergency Medicine | Admitting: Emergency Medicine

## 2011-08-18 DIAGNOSIS — M25519 Pain in unspecified shoulder: Secondary | ICD-10-CM | POA: Insufficient documentation

## 2011-08-18 DIAGNOSIS — M25562 Pain in left knee: Secondary | ICD-10-CM

## 2011-08-18 DIAGNOSIS — G20 Parkinson's disease: Secondary | ICD-10-CM | POA: Diagnosis not present

## 2011-08-18 DIAGNOSIS — R109 Unspecified abdominal pain: Secondary | ICD-10-CM

## 2011-08-18 DIAGNOSIS — E119 Type 2 diabetes mellitus without complications: Secondary | ICD-10-CM | POA: Diagnosis not present

## 2011-08-18 DIAGNOSIS — E042 Nontoxic multinodular goiter: Secondary | ICD-10-CM | POA: Diagnosis not present

## 2011-08-18 DIAGNOSIS — G20A1 Parkinson's disease without dyskinesia, without mention of fluctuations: Secondary | ICD-10-CM | POA: Insufficient documentation

## 2011-08-18 DIAGNOSIS — Z79899 Other long term (current) drug therapy: Secondary | ICD-10-CM | POA: Diagnosis not present

## 2011-08-18 DIAGNOSIS — F0390 Unspecified dementia without behavioral disturbance: Secondary | ICD-10-CM | POA: Diagnosis not present

## 2011-08-18 DIAGNOSIS — M25559 Pain in unspecified hip: Secondary | ICD-10-CM | POA: Diagnosis not present

## 2011-08-18 DIAGNOSIS — E079 Disorder of thyroid, unspecified: Secondary | ICD-10-CM | POA: Diagnosis not present

## 2011-08-18 DIAGNOSIS — R932 Abnormal findings on diagnostic imaging of liver and biliary tract: Secondary | ICD-10-CM | POA: Diagnosis not present

## 2011-08-18 DIAGNOSIS — M25569 Pain in unspecified knee: Secondary | ICD-10-CM | POA: Diagnosis not present

## 2011-08-18 DIAGNOSIS — Z7982 Long term (current) use of aspirin: Secondary | ICD-10-CM | POA: Diagnosis not present

## 2011-08-18 DIAGNOSIS — Z043 Encounter for examination and observation following other accident: Secondary | ICD-10-CM | POA: Diagnosis not present

## 2011-08-18 DIAGNOSIS — R52 Pain, unspecified: Secondary | ICD-10-CM | POA: Diagnosis not present

## 2011-08-18 DIAGNOSIS — J45909 Unspecified asthma, uncomplicated: Secondary | ICD-10-CM | POA: Insufficient documentation

## 2011-08-18 DIAGNOSIS — Y9210 Unspecified residential institution as the place of occurrence of the external cause: Secondary | ICD-10-CM | POA: Insufficient documentation

## 2011-08-18 DIAGNOSIS — W19XXXA Unspecified fall, initial encounter: Secondary | ICD-10-CM

## 2011-08-18 DIAGNOSIS — W010XXA Fall on same level from slipping, tripping and stumbling without subsequent striking against object, initial encounter: Secondary | ICD-10-CM | POA: Insufficient documentation

## 2011-08-18 DIAGNOSIS — Z9089 Acquired absence of other organs: Secondary | ICD-10-CM | POA: Diagnosis not present

## 2011-08-18 DIAGNOSIS — K838 Other specified diseases of biliary tract: Secondary | ICD-10-CM | POA: Diagnosis not present

## 2011-08-18 MED ORDER — HYDROCODONE-ACETAMINOPHEN 5-325 MG PO TABS: ORAL_TABLET | ORAL | Status: AC

## 2011-08-18 MED ORDER — HYDROCODONE-ACETAMINOPHEN 5-325 MG PO TABS
1.0000 | ORAL_TABLET | Freq: Once | ORAL | Status: AC
  Administered 2011-08-18: 1 via ORAL
  Filled 2011-08-18: qty 1

## 2011-08-18 NOTE — ED Notes (Signed)
Pt states that she was walking in x-ray and the doors closed on her cane and she tripped. Pt c/o pain in her right upper leg just above knee and her left shoulder. Pt alert and oriented x 3. Skin warm and dry. Color pink. Breath sounds clear and equal bilaterally. Pt able to move all extremities but c/o pain in her right femur.

## 2011-08-18 NOTE — ED Notes (Signed)
Pt ambulated well. Family and pt states that is how she walks at home. Pt states no different except for the increased pain. PA aware

## 2011-08-18 NOTE — ED Notes (Signed)
Pt fell while walking into hospital today. Hurting to right upper leg and left shoulder.pt states door closed too soon and tripped.

## 2011-08-18 NOTE — ED Provider Notes (Signed)
History     CSN: [id number]  Arrival date & time 08/18/11  1191   First MD Initiated Contact with Patient 08/18/11 [phone]      Chief Complaint  Patient presents with  . Shoulder Pain  . Fall  . Leg Pain    (Consider location/radiation/quality/duration/timing/severity/associated sxs/prior treatment) HPI Comments: Patient who was at the hospital for an outpatient ultrasound of the neck and abdomen states she completed the studies and was walking with a cane and as she tried to open the door, she slipped and fell.  C/o pain to her left shoulder and bilateral knees.  She denies head injury, neck pain, chest pain, shortness of breath, headache, dizziness or LOC.  She denies any symptoms prior to the fall.    Patient is a 78 y.o. female presenting with shoulder pain, fall, and leg pain. The history is provided by the patient and a relative.  Shoulder Pain This is a new problem. The current episode started today. The problem occurs constantly. The problem has been unchanged. Associated symptoms include arthralgias. Pertinent negatives include no abdominal pain, chest pain, diaphoresis, fatigue, fever, headaches, joint swelling, nausea, neck pain, numbness, rash, sore throat, vertigo, visual change, vomiting or weakness. The symptoms are aggravated by bending, twisting, walking and standing. She has tried nothing for the symptoms. The treatment provided no relief.  Fall Pertinent negatives include no visual change, no fever, no numbness, no abdominal pain, no nausea, no vomiting, no hematuria and no headaches.  Leg Pain  Pertinent negatives include no numbness.  Shoulder Pain This is a new problem. The current episode started today. The problem occurs constantly. The problem has been unchanged. Pertinent negatives include no chest pain, no abdominal pain, no headaches and no shortness of breath. The symptoms are aggravated by bending, twisting, walking and standing. She has tried nothing for the  symptoms. The treatment provided no relief.    Past Medical History  Diagnosis Date  . Dementia   . Bronchitis   . Asthma   . Diabetes mellitus   . Parkinson disease     Past Surgical History  Procedure Date  . Abdominal hysterectomy   . Joint replacement   . Fracture surgery   . Mastectomy     History reviewed. No pertinent family history.  History  Substance Use Topics  . Smoking status: Never Smoker   . Smokeless tobacco: Not on file  . Alcohol Use: No    OB History    Grav Para Term Preterm Abortions TAB SAB Ect Mult Living                  Review of Systems  Constitutional: Negative for fever, diaphoresis and fatigue.  HENT: Negative for sore throat and neck pain.   Eyes: Negative for visual disturbance.  Respiratory: Negative for chest tightness and shortness of breath.   Cardiovascular: Negative for chest pain.  Gastrointestinal: Negative for nausea, vomiting and abdominal pain.  Genitourinary: Negative for hematuria and flank pain.  Musculoskeletal: Positive for arthralgias. Negative for back pain and joint swelling.  Skin: Negative for rash.  Neurological: Negative for dizziness, vertigo, facial asymmetry, speech difficulty, weakness, numbness and headaches.  Psychiatric/Behavioral: Negative for behavioral problems, confusion and decreased concentration.  All other systems reviewed and are negative.    Allergies  Review of patient's allergies indicates no known allergies.  Home Medications   Current Outpatient Rx  Name Route Sig Dispense Refill  . ALPRAZOLAM 0.25 MG PO TABS Oral Take 0.25  mg by mouth 2 (two) times daily.    Marland Kitchen AMLODIPINE BESYLATE 5 MG PO TABS Oral Take 5 mg by mouth daily.    . ASPIRIN EC 81 MG PO TBEC Oral Take 81 mg by mouth daily.    Marland Kitchen CALCIUM CARBONATE-VITAMIN D 600-200 MG-UNIT PO TABS Oral Take 1 tablet by mouth every evening.     . DONEPEZIL HCL 5 MG PO TABS Oral Take 5 mg by mouth daily.    Marland Kitchen FERROUS SULFATE 325 (65 FE) MG  PO TABS Oral Take 325 mg by mouth daily.    Marland Kitchen FLUOXETINE HCL 20 MG PO CAPS Oral Take 20 mg by mouth daily.    Marland Kitchen FOLIC ACID 1 MG PO TABS Oral Take 1 mg by mouth daily.    . FUROSEMIDE 20 MG PO TABS Oral Take 20 mg by mouth daily.    Marland Kitchen GABAPENTIN 300 MG PO CAPS Oral Take 300 mg by mouth every evening.     Marland Kitchen HYDROCODONE-ACETAMINOPHEN 5-500 MG PO TABS Oral Take 1 tablet by mouth 2 (two) times daily as needed. Pain    . LEVETIRACETAM 250 MG PO TABS Oral Take 250 mg by mouth 2 (two) times daily.    Marland Kitchen LOVASTATIN 10 MG PO TABS Oral Take 10 mg by mouth at bedtime.    Marland Kitchen MECLIZINE HCL 25 MG PO TABS Oral Take 25 mg by mouth every 6 (six) hours as needed. dizziness    . OLMESARTAN MEDOXOMIL 40 MG PO TABS Oral Take 40 mg by mouth daily.    Marland Kitchen OMEPRAZOLE 20 MG PO CPDR Oral Take 20 mg by mouth daily.    Marland Kitchen POTASSIUM CHLORIDE CRYS ER 20 MEQ PO TBCR Oral Take 0.5 tablets (10 mEq total) by mouth daily. 30 tablet 3  . ALBUTEROL SULFATE HFA 108 (90 BASE) MCG/ACT IN AERS Inhalation Inhale 1-2 puffs into the lungs every 6 (six) hours as needed for wheezing. 1 Inhaler 0    BP 167/110  Pulse 79  Temp 97.9 F (36.6 C) (Oral)  Ht 5\' 2"  (1.575 m)  Wt 228 lb (103.42 kg)  BMI 41.70 kg/m2  SpO2 100%  Physical Exam  Nursing note and vitals reviewed. Constitutional: She is oriented to person, place, and time. She appears well-developed and well-nourished. No distress.  HENT:  Head: Normocephalic and atraumatic.  Mouth/Throat: Oropharynx is clear and moist. No oropharyngeal exudate.  Eyes: Conjunctivae and EOM are normal. Pupils are equal, round, and reactive to light.  Neck: Normal range of motion and phonation normal. Neck supple. No spinous process tenderness and no muscular tenderness present.  Cardiovascular: Normal rate, regular rhythm, normal heart sounds and intact distal pulses.   No murmur heard. Pulmonary/Chest: Effort normal and breath sounds normal.  Abdominal: Soft. She exhibits no distension and no  mass. There is no tenderness. There is no rebound and no guarding.  Musculoskeletal: She exhibits tenderness. She exhibits no edema.       Left shoulder: She exhibits decreased range of motion, tenderness, bony tenderness and pain. She exhibits no swelling, no effusion, no crepitus, no deformity, no laceration, no spasm, normal pulse and normal strength.       Right knee: She exhibits normal range of motion, no swelling, no effusion, no ecchymosis, no deformity, no laceration, no erythema and no bony tenderness. tenderness found. Medial joint line, lateral joint line and patellar tendon tenderness noted.       Left knee: She exhibits normal range of motion, no swelling, no effusion,  no deformity, no laceration, no erythema and no bony tenderness. tenderness found. Medial joint line and lateral joint line tenderness noted. No patellar tendon tenderness noted.       Arms:      Legs: Lymphadenopathy:    She has no cervical adenopathy.  Neurological: She is alert and oriented to person, place, and time. No cranial nerve deficit. She exhibits normal muscle tone. Coordination normal.  Skin: Skin is warm and dry.    ED Course  Procedures (including critical care time)  Labs Reviewed - No data to display Dg Pelvis 1-2 Views  08/18/2011  *RADIOLOGY REPORT*  Clinical Data: Fall  PELVIS - 1-2 VIEW  Comparison: None.  Findings: Single frontal view of the pelvis submitted.  No acute fracture or subluxation.  Degenerative changes with narrowing of superior joint space noted bilateral hip joints.  IMPRESSION: No acute fracture or subluxation.  Degenerative changes bilateral hip joints.  Original Report Authenticated By: Natasha Mead, M.D.   Dg Femur Right  08/18/2011  *RADIOLOGY REPORT*  Clinical Data: Pain post fall  RIGHT FEMUR - 2 VIEW  Comparison: None.  Findings: Four views of the right femur submitted.  No acute fracture or subluxation.  There is a right knee prosthesis in anatomic alignment.  A  metallic  fixation plate noted mid  in distal right femur.  The fixation material appears intact.  IMPRESSION: No acute fracture or subluxation.  Right knee prosthesis in place. Metallic fixation material distal femur in anatomic alignment.  Original Report Authenticated By: Natasha Mead, M.D.   US Soft Tissue Head/neck  08/18/2011  *RADIOLOGY REPORT*  Clinical Data: Thyroid mass  THYROID ULTRASOUND  Technique:  Ultrasound examination of the thyroid gland and adjacent soft tissues was performed.  Comparison: No comparison studies available.  Findings:  The right thyroid lobe measures 4.5 x 1.9 x 2.4 cm.  The left lobe measures 3.6 x 1.2 x 1.4 cm.  The isthmus is 7 mm in thickness.  Bilateral discrete thyroid nodules are identified.  The dominant nodule is in the lower pole of the right thyroid gland measures 2.7 x 1.6 x 1.9 cm.  This is a mixed cystic and solid lesion with some echogenic foci raising the question of microcalcification.  A second right thyroid nodule measures 1.5 x 1.2 x 1.6 cm.  This nodule is towards the upper pole and also contains some associated microcalcification.  In the left thyroid lobe, several small nodules are evident including a hypoechoic 7 mm nodule in the interpolar region and a cystic 7 mm nodule in the lower pole.  Impression:  Two discrete nodules in the right thyroid gland with associated microcalcification.  Tissue sampling is recommended.  This recommendation follows the consensus statement:  Management of Thyroid Nodules Detected as [place]:  Society of Radiologists in Ultrasound Consensus Conference Statement.  Radiology 2005; 237:794- 800.  Original Report Authenticated By: ERIC A. MANSELL, M.D.   Dg Shoulder Left  08/18/2011  *RADIOLOGY REPORT*  Clinical Data: Shoulder pain post fall  LEFT SHOULDER - 2+ VIEW  Comparison: None.  Findings: Three views of the left shoulder submitted.  No acute fracture or subluxation.  Significant degenerative changes are noted left shoulder joint.  There  is significant narrowing of glenohumeral joint.  There is significant spurring of the left humeral head.  There is remodeling of the left humeral head. Moderate degenerative changes are noted left AC joint.  IMPRESSION: No acute fracture or subluxation.  Significant degenerative changes as described above.  Original Report Authenticated By: Natasha Mead, M.D.   Dg Knee Complete 4 Views Left  08/18/2011  *RADIOLOGY REPORT*  Clinical Data: Fall  LEFT KNEE - COMPLETE 4+ VIEW  Comparison: None.  Findings: Four views of the left knee submitted.  No acute fracture or subluxation.  There is left knee prosthesis in anatomic alignment.  No evidence of prosthesis loosening.  IMPRESSION: No acute fracture or subluxation.  Left knee prosthesis in anatomic alignment.  Original Report Authenticated By: Natasha Mead, M.D.   Dg Knee Complete 4 Views Right  08/18/2011  *RADIOLOGY REPORT*  Clinical Data: Fall  RIGHT KNEE - COMPLETE 4+ VIEW  Comparison: 12/28/2006  Findings: Four views of the right knee submitted.  There is a right knee prosthesis  in anatomic alignment.  A metallic fixation plate and screws noted in distal right femur.  There is anatomic alignment.  No acute fracture or subluxation.  IMPRESSION: Right knee prosthesis in place.  Metallic fixation plate in distal right femur in anatomic alignment.  No acute fracture or subluxation.  Original Report Authenticated By: Natasha Mead, M.D.        MDM   Patient is feeling better,  No neck pain, no focal neuro deficits on exam. Requesting to go home.   Patient ambulates with her cane w/o difficulty. Gait is steady.   Daughter states is baseline for her.     Patient also seen by the EDP prior to d/c and care plan was discussed.  Pt has follow-up appt with Dr. [name].  Advised pt to return here if her sx's worsen.  She verbalized understanding and  agree to care plan  Patient is slightly hypertensive, but states she did not take her medications this morning due to  her imaging.  agrees to take her HTN meds when she gets home.  The patient appears reasonably screened and/or stabilized for discharge and I doubt any other medical condition or other [facility] requiring further screening, evaluation, or treatment in the ED at this time prior to discharge.   Prescribed :  norco #[id number]  Tammy L. [place], [place] 08/23/11 2150          Medical screening examination/treatment/procedure(s) were conducted as a shared visit with non-physician practitioner(s) and myself.  I personally evaluated the patient during the encounter   Benny Lennert, MD 08/24/11 1231

## 2011-08-19 NOTE — ED Provider Notes (Signed)
Medical screening examination/treatment/procedure(s) were conducted as a shared visit with non-physician practitioner(s) and myself.  I personally evaluated the patient during the encounter Pt with fall.  pe tender left shouler  Benny Lennert, MD 08/19/11 [phone]

## 2011-08-21 DIAGNOSIS — R109 Unspecified abdominal pain: Secondary | ICD-10-CM | POA: Diagnosis not present

## 2011-08-21 DIAGNOSIS — E119 Type 2 diabetes mellitus without complications: Secondary | ICD-10-CM | POA: Diagnosis not present

## 2011-08-21 LAB — HEPATIC FUNCTION PANEL: WBC: 5.1

## 2011-08-21 LAB — COMPREHENSIVE METABOLIC PANEL
ALT: 11 U/L (ref 7–35)
AST: 13 U/L
Alkaline Phosphatase: 62 U/L
BUN: 17 mg/dL (ref 4–21)
Calcium: 9.3 mg/dL
Creat: 1.12
Glucose: 119
Total Bilirubin: 0.4 mg/dL

## 2011-08-28 ENCOUNTER — Other Ambulatory Visit (HOSPITAL_COMMUNITY): Payer: Self-pay | Admitting: Internal Medicine

## 2011-08-28 DIAGNOSIS — E041 Nontoxic single thyroid nodule: Secondary | ICD-10-CM

## 2011-09-01 ENCOUNTER — Ambulatory Visit (HOSPITAL_COMMUNITY)
Admission: RE | Admit: 2011-09-01 | Discharge: 2011-09-01 | Disposition: A | Discharge status: Home / Self Care | Payer: Medicare Other | Source: Ambulatory Visit | Attending: Urgent Care | Admitting: Urgent Care

## 2011-09-01 ENCOUNTER — Encounter (HOSPITAL_COMMUNITY): Payer: Self-pay

## 2011-09-01 ENCOUNTER — Encounter: Payer: Self-pay | Admitting: Urgent Care

## 2011-09-01 ENCOUNTER — Ambulatory Visit (INDEPENDENT_AMBULATORY_CARE_PROVIDER_SITE_OTHER): Payer: Medicare Other | Admitting: Urgent Care

## 2011-09-01 VITALS — BP 130/59 | HR 76 | Temp 98.4°F | Ht 62.0 in | Wt 230.8 lb

## 2011-09-01 DIAGNOSIS — R935 Abnormal findings on diagnostic imaging of other abdominal regions, including retroperitoneum: Secondary | ICD-10-CM | POA: Diagnosis not present

## 2011-09-01 DIAGNOSIS — R109 Unspecified abdominal pain: Secondary | ICD-10-CM

## 2011-09-01 DIAGNOSIS — K5730 Diverticulosis of large intestine without perforation or abscess without bleeding: Secondary | ICD-10-CM | POA: Insufficient documentation

## 2011-09-01 DIAGNOSIS — K7689 Other specified diseases of liver: Secondary | ICD-10-CM | POA: Insufficient documentation

## 2011-09-01 DIAGNOSIS — R1011 Right upper quadrant pain: Secondary | ICD-10-CM | POA: Diagnosis not present

## 2011-09-01 LAB — CREATININE, SERUM
GFR calc Af Amer: 51 mL/min — ABNORMAL LOW (ref >90)
GFR calc non Af Amer: 44 mL/min — ABNORMAL LOW (ref >90)

## 2011-09-01 MED ORDER — ONDANSETRON HCL 4 MG PO TABS: 4.0000 mg | ORAL_TABLET | Freq: Four times a day (QID) | ORAL | Status: AC | PRN

## 2011-09-01 MED ORDER — IOHEXOL 300 MG/ML  SOLN
100.0000 mL | Freq: Once | INTRAMUSCULAR | Status: AC | PRN
  Administered 2011-09-01: 100 mL via INTRAVENOUS

## 2011-09-01 NOTE — Progress Notes (Signed)
Faxed to PCP

## 2011-09-01 NOTE — Progress Notes (Signed)
Referring Provider: Avon Gully, MD Primary Care Physician:  Avon Gully, MD Primary Gastroenterologist:  Dr. Jonette Eva  Chief Complaint  Patient presents with  . Abdominal Pain    under right breast    HPI:  Tiffany Hartman is a 78 y.o. female here as a referral from Dr. [name] for RUQ pain x 2 months.  C/o severe pain attacks under her right breast & some RUQ/RLQ pain.  Pt initially felt pain was due to coughing & bronchitis.  She was seen in ER.  Pain radiates between her shoulder blades.  Pain is rated 10/10 on pain scale ar worst.  It usually lasts 30-45 minutes.  C/o nausea & dry heaves w/ the pain.  Pain attacks awaken her from sleeping at 6am this past Sunday morning.  Better w/ "pain pills" vicodin.  C/o poor appetite.  She tells me she has not been eating much.  She is now taking MOM for constipation & occasional stool softeners.  Since she started this regimen she now has BM QOD.  Denies rectal bleeding or melena.  She is having chills, but denies fever.  She has been taking prilosec 20mg  daily for years for GERD.  C/o heartburn & indigestion w/ certain foods.  She has never had a colonoscopy or EGD.  Her weight is stable.  08/18/11 abdominal ultrasound showed coarsening of the liver echotexture may be related to fatty infiltration & CBD dilated at 13 mm without evidence for associated intrahepatic biliary duct dilatation. However 2 sets of LFTs have been normal.  While getting abdominal ultrasound, she fell & had subsequent xrays of pelvis, knee, hip, & thigh done which were normal.  CT angio chest w/wo contrast 07/2011-No evidence of acute pulmonary thromboembolism.  2 cm left right thyroid hypodensity. Thyroid ultrasound is recommended when feasible. Possible gallstones.  She is scheduled to have US-guided thyroid biopsy for 2 right thyroid nodules tomorrow.  She is having slight bilateral lower extremity edema & is on lasix.    08/21/11 CMP creatinine1.12, otherwise normal. LFTs normal.  White blood cell count 5.1, hemoglobin 12.3, hematocrit 39.3, platelets 332.  Recent Results (from the past 1344 hour(s))  CBC WITH DIFFERENTIAL   Collection Time   07/16/11  6:40 PM      Component Value Range   WBC 6.0  4.0 - 10.5 K/uL   RBC 4.18  3.87 - 5.11 MIL/uL   Hemoglobin 11.9 (*) 12.0 - 15.0 g/dL   HCT 36.6  44.0 - 34.7 %   MCV 88.5  78.0 - 100.0 fL   MCH 28.5  26.0 - 34.0 pg   MCHC 32.2  30.0 - 36.0 g/dL   RDW 42.5  95.6 - 38.7 %   Platelets 307  150 - 400 K/uL   Neutrophils Relative 50  43 - 77 %   Neutro Abs 3.0  1.7 - 7.7 K/uL   Lymphocytes Relative 40  12 - 46 %   Lymphs Abs 2.4  0.7 - 4.0 K/uL   Monocytes Relative 5  3 - 12 %   Monocytes Absolute 0.3  0.1 - 1.0 K/uL   Eosinophils Relative 3  0 - 5 %   Eosinophils Absolute 0.2  0.0 - 0.7 K/uL   Basophils Relative 1  0 - 1 %   Basophils Absolute 0.0  0.0 - 0.1 K/uL  COMPREHENSIVE METABOLIC PANEL   Collection Time   07/16/11  6:40 PM      Component Value Range   Sodium 140  135 - 145 mEq/L   Potassium 3.4 (*) 3.5 - 5.1 mEq/L   Chloride 106  96 - 112 mEq/L   CO2 24  19 - 32 mEq/L   Glucose, Bld 96  70 - 99 mg/dL   BUN 18  6 - 23 mg/dL   Creatinine, Ser 0.86 (*) 0.50 - 1.10 mg/dL   Calcium 9.3  8.4 - 57.8 mg/dL   Total Protein 7.1  6.0 - 8.3 g/dL   Albumin 3.6  3.5 - 5.2 g/dL   AST 13  0 - 37 U/L   ALT 10  0 - 35 U/L   Alkaline Phosphatase 65  39 - 117 U/L   Total Bilirubin 0.3  0.3 - 1.2 mg/dL   GFR calc non Af Amer 41 (*) >90 mL/min   GFR calc Af Amer 48 (*) >90 mL/min  CARDIAC PANEL(CRET KIN+CKTOT+MB+TROPI)   Collection Time   07/16/11  6:40 PM      Component Value Range   Total CK 151  7 - 177 U/L   CK, MB 2.7  0.3 - 4.0 ng/mL   Troponin I <0.30  <0.30 ng/mL   Relative Index 1.8  0.0 - 2.5  D-DIMER, QUANTITATIVE   Collection Time   07/16/11  6:40 PM      Component Value Range   D-Dimer, Quant 0.84 (*) 0.00 - 0.48 ug/mL-FEU     Past Medical History  Diagnosis Date  . Dementia   . Bronchitis     . Asthma   . Diabetes mellitus   . Parkinson disease   . HTN (hypertension)   . OA (osteoarthritis)   . Thyroid nodule     Dr [name]  . Breast cancer   . Glaucoma   . Diverticulitis 2009    Past Surgical History  Procedure Date  . Abdominal hysterectomy     complete  . Joint replacement 1998/1999    knee boths  . Fracture surgery 2008    fx right femur  . Mastectomy 02/20/1983    left     Current Outpatient Prescriptions  Medication Sig Dispense Refill  . albuterol (PROVENTIL HFA;VENTOLIN HFA) 108 (90 BASE) MCG/ACT inhaler Inhale 1-2 puffs into the lungs every 6 (six) hours as needed for wheezing.  1 Inhaler  0  . ALPRAZolam (XANAX) 0.25 MG tablet Take 0.25 mg by mouth 2 (two) times daily.      Marland Kitchen amLODipine (NORVASC) 5 MG tablet Take 5 mg by mouth daily.      Marland Kitchen aspirin EC 81 MG tablet Take 81 mg by mouth daily.      . Calcium Carbonate-Vitamin D (CALCIUM + D) 600-200 MG-UNIT TABS Take 1 tablet by mouth every evening.       . donepezil (ARICEPT) 5 MG tablet Take 5 mg by mouth daily.      . ferrous sulfate 325 (65 FE) MG tablet Take 325 mg by mouth daily.      Marland Kitchen FLUoxetine (PROZAC) 20 MG capsule Take 20 mg by mouth daily.      . folic acid (FOLVITE) 1 MG tablet Take 1 mg by mouth daily.      . furosemide (LASIX) 20 MG tablet Take 20 mg by mouth daily.      Marland Kitchen gabapentin (NEURONTIN) 300 MG capsule Take 300 mg by mouth every evening.       Marland Kitchen HYDROcodone-acetaminophen (VICODIN) 5-500 MG per tablet Take 1 tablet by mouth 2 (two) times daily as needed. Pain      .  levETIRAcetam (KEPPRA) 250 MG tablet Take 250 mg by mouth 2 (two) times daily.      Marland Kitchen lovastatin (MEVACOR) 10 MG tablet Take 10 mg by mouth at bedtime.      . meclizine (ANTIVERT) 25 MG tablet Take 25 mg by mouth every 6 (six) hours as needed. dizziness      . olmesartan (BENICAR) 40 MG tablet Take 40 mg by mouth daily.      Marland Kitchen omeprazole (PRILOSEC) 20 MG capsule Take 20 mg by mouth daily.      . potassium chloride SA  (K-DUR,KLOR-CON) 20 MEQ tablet Take 0.5 tablets (10 mEq total) by mouth daily.  30 tablet  3  . ondansetron (ZOFRAN) 4 MG tablet Take 1 tablet (4 mg total) by mouth every 6 (six) hours as needed for nausea.  30 tablet  1    Allergies as of 09/01/2011  . (No Known Allergies)    Family History  Problem Relation Age of Onset  . Colon polyps Daughter 57  . Stroke Father   . Huntington's disease Mother   . Throat cancer Son     History   Social History  . Marital Status: Widowed    Spouse Name: N/A    Number of Children: 6  . Years of Education: N/A   Occupational History  . retired, CNA [employer]    Social History Main Topics  . Smoking status: Never Smoker   . Smokeless tobacco: Not on file  . Alcohol Use: No  . Drug Use: No  . Sexually Active: No   Other Topics Concern  . Not on file   Social History Narrative   Lives w/ daughter   Review of Systems: See HPI, otherwise negative ROS  Physical Exam: BP 130/59  Pulse 76  Temp 98.4 F (36.9 C) (Temporal)  Ht 5\' 2"  (1.575 m)  Wt 230 lb 12.8 oz (104.69 kg)  BMI 42.21 kg/m2 No LMP recorded. Patient is postmenopausal. General:   Alert,  Well-developed, well-nourished, pleasant and cooperative in NAD.  Accompanied by her daughter & grand-daughter. Head:  Normocephalic and atraumatic. Eyes:  Sclera clear, no icterus.   Conjunctiva pink. Ears:  Normal auditory acuity. Nose:  No deformity, discharge, or lesions. Mouth:  No deformity or lesions,oropharynx pink & moist. Neck:  Supple; no masses or thyromegaly. Lungs:  Clear throughout to auscultation.   No wheezes, crackles, or rhonchi. No acute distress. Heart:  Regular rate and rhythm; no murmurs, clicks, rubs,  or gallops. Abdomen:  Normal bowel sounds.  No bruits.  +very tender along Right costal margin.  +RUQ tenderness.  Tenderness around umbilicus.  Soft, non-tender and non-distended without masses, hepatosplenomegaly or hernias noted.  No guarding or rebound tenderness.   Exam limited given biody habitus.   Rectal:  Deferred. Msk:  Symmetrical without gross deformities.  Pulses:  Normal pulses noted. Extremities:  1+ LEE bilaterally. Neurologic:  Alert and oriented x4;  grossly normal neurologically. Skin:  Intact without significant lesions or rashes. Lymph Nodes:  No significant cervical adenopathy. Psych:  Alert and cooperative. Normal mood and affect.

## 2011-09-01 NOTE — Assessment & Plan Note (Addendum)
Tiffany Hartman is a pleasant 78 y.o. female two-month history of severe intermittent right upper quadrant pain and nausea. She is also quite tender along the right costal margin/right ribs. Previous CT angiogram of the chest benign, but questions gallstones. Previous abdominal ultrasound shows 13 mm common bile duct. Fatty liver. No evidence of stones.  Interestingly, liver function tests have been normal. Etiology of her abdominal pain is unclear at this time. CT scan to further characterize.  CT scan of abdomen and pelvis with IV and oral contrast as soon as possible if creatinine is normal Please FU w/ Dr [name] regarding your thyroid & kidney function zofran 4mg  every 6 hrs as needed for nausea & vomiting. Vicodin as directed for pain To ER if severe pain.

## 2011-09-01 NOTE — Patient Instructions (Addendum)
Please FU w/ Dr [name] regarding your thyroid & kidney function CT abdomen & pelvis.  We will call you w/ results. Use zofran 4mg  every 6 hrs as needed for nausea & vomiting. You may use Vicodin you have for pain as directed To ER if severe pain.

## 2011-09-02 ENCOUNTER — Other Ambulatory Visit (HOSPITAL_COMMUNITY): Payer: Self-pay | Admitting: Internal Medicine

## 2011-09-02 ENCOUNTER — Ambulatory Visit (HOSPITAL_COMMUNITY)
Admission: RE | Admit: 2011-09-02 | Discharge: 2011-09-02 | Disposition: A | Discharge status: Home / Self Care | Payer: Medicare Other | Source: Ambulatory Visit | Attending: Internal Medicine | Admitting: Internal Medicine

## 2011-09-02 ENCOUNTER — Telehealth: Payer: Self-pay

## 2011-09-02 DIAGNOSIS — E041 Nontoxic single thyroid nodule: Secondary | ICD-10-CM

## 2011-09-02 DIAGNOSIS — E049 Nontoxic goiter, unspecified: Secondary | ICD-10-CM | POA: Insufficient documentation

## 2011-09-02 MED ORDER — PROMETHAZINE HCL 12.5 MG PO TABS: ORAL_TABLET | ORAL | Status: DC

## 2011-09-02 NOTE — Progress Notes (Signed)
Lidocaine 2%        4mL injected                                Right thyroid biopsy performed 

## 2011-09-02 NOTE — Telephone Encounter (Signed)
PT MAY HAVE PHENRGAN.  CALL GRAND-DAUGHTER. RX SENT.

## 2011-09-02 NOTE — Telephone Encounter (Signed)
Pts insurance will not pay for zofran without PA. Pt wants to know if we can just change her to something else. Pt uses [facility]/ [address].

## 2011-09-02 NOTE — Telephone Encounter (Signed)
Forwarding to Dr. Darrick Penna in Kandice's absence today.

## 2011-09-02 NOTE — Telephone Encounter (Signed)
Pt's granddaughter [employer]) called to see what the next step would be for her grandmother after she had to CT yesterday. I told her someone would have to call her back at [phone] or [phone] to advise.

## 2011-09-02 NOTE — Procedures (Signed)
PreOperative Dx: 2 RIGHT thyroid nodules Postoperative Dx: 2 RIGHT thyroid nodules Procedure:   US guided FNA of TWO RIGHT thyroid nodules Radiologist:  Tyron Russell Anesthesia:  2.5 ml of 2% lidocaine Specimen:  FNA x 3 of each RIGHT thyroid nodule EBL:   None Complications: None

## 2011-09-02 NOTE — Telephone Encounter (Signed)
Grand-daughter aware of Rx at drug store

## 2011-09-02 NOTE — Progress Notes (Signed)
Quick Note:  Results given to pt/pt's granddaughter last night after CT. I told them I would look at films today & discuss best way to look at pancreas to get better images/poss biopsy. Will discuss w/ Dr Darrick Penna. ______

## 2011-09-03 NOTE — Progress Notes (Signed)
Quick Note:  CC: FANTA,TESFAYE, MD  ______

## 2011-09-03 NOTE — Progress Notes (Signed)
Discussed with Dr. Darrick Penna. Plan for office visit with Dr. Dulce Sellar to discuss EUS for mass at head of pancreas. Discussed with patient, however she would like me to discuss further with her daughter Kolleen [facility] phone [phone]. Left message on Ms. Sam voice mail for return call. I will be happy to discuss with her when she returns from call. Soledad Gerlach, please arrange office visit with Dr. Dulce Sellar. Thanks CC: FANTA,TESFAYE, MD

## 2011-09-03 NOTE — Progress Notes (Signed)
Referral has been faxed over to Dr. Malena Edman office an they will contact the patient with date and time

## 2011-09-03 NOTE — Progress Notes (Signed)
Quick Note:  Noted. Discussed w/ CT Tech Tammy. OK for CT w/ dose-adjustment contrast. ______

## 2011-09-04 DIAGNOSIS — K869 Disease of pancreas, unspecified: Secondary | ICD-10-CM | POA: Diagnosis not present

## 2011-09-10 ENCOUNTER — Ambulatory Visit (HOSPITAL_COMMUNITY): Payer: Medicare Other | Admitting: Anesthesiology

## 2011-09-10 ENCOUNTER — Encounter (HOSPITAL_COMMUNITY): Payer: Self-pay | Admitting: *Deleted

## 2011-09-10 ENCOUNTER — Ambulatory Visit (HOSPITAL_COMMUNITY)
Admission: RE | Admit: 2011-09-10 | Discharge: 2011-09-10 | Disposition: A | Discharge status: Home / Self Care | Payer: Medicare Other | Source: Ambulatory Visit | Attending: Gastroenterology | Admitting: Gastroenterology

## 2011-09-10 ENCOUNTER — Encounter (HOSPITAL_COMMUNITY): Payer: Self-pay | Admitting: Anesthesiology

## 2011-09-10 ENCOUNTER — Encounter (HOSPITAL_COMMUNITY)
Admission: RE | Disposition: A | Discharge status: Home / Self Care | Payer: Self-pay | Source: Ambulatory Visit | Attending: Gastroenterology

## 2011-09-10 DIAGNOSIS — D136 Benign neoplasm of pancreas: Secondary | ICD-10-CM | POA: Diagnosis not present

## 2011-09-10 DIAGNOSIS — R932 Abnormal findings on diagnostic imaging of liver and biliary tract: Secondary | ICD-10-CM | POA: Diagnosis not present

## 2011-09-10 DIAGNOSIS — R1011 Right upper quadrant pain: Secondary | ICD-10-CM | POA: Insufficient documentation

## 2011-09-10 DIAGNOSIS — D379 Neoplasm of uncertain behavior of digestive organ, unspecified: Secondary | ICD-10-CM | POA: Diagnosis not present

## 2011-09-10 DIAGNOSIS — R109 Unspecified abdominal pain: Secondary | ICD-10-CM | POA: Diagnosis not present

## 2011-09-10 HISTORY — PX: FINE NEEDLE ASPIRATION: SHX5430

## 2011-09-10 HISTORY — PX: EUS: SHX5427

## 2011-09-10 SURGERY — UPPER ENDOSCOPIC ULTRASOUND (EUS) RADIAL
Anesthesia: Monitor Anesthesia Care

## 2011-09-10 MED ORDER — SODIUM CHLORIDE 0.9 % IV SOLN
INTRAVENOUS | Status: DC
  Administered 2011-09-10: 20 mL/h via INTRAVENOUS

## 2011-09-10 MED ORDER — FENTANYL CITRATE 0.05 MG/ML IJ SOLN
INTRAMUSCULAR | Status: DC | PRN
  Administered 2011-09-10: 50 ug via INTRAVENOUS

## 2011-09-10 MED ORDER — BUTAMBEN-TETRACAINE-BENZOCAINE 2-2-14 % EX AERO
INHALATION_SPRAY | CUTANEOUS | Status: DC | PRN
  Administered 2011-09-10: 2 via TOPICAL

## 2011-09-10 MED ORDER — MIDAZOLAM HCL 5 MG/5ML IJ SOLN
INTRAMUSCULAR | Status: DC | PRN
  Administered 2011-09-10: 1 mg via INTRAVENOUS

## 2011-09-10 MED ORDER — LACTATED RINGERS IV SOLN
INTRAVENOUS | Status: DC | PRN
  Administered 2011-09-10: 12:00:00 via INTRAVENOUS

## 2011-09-10 MED ORDER — PROPOFOL 10 MG/ML IV EMUL
INTRAVENOUS | Status: DC | PRN
  Administered 2011-09-10: 50 mg via INTRAVENOUS
  Administered 2011-09-10: 30 mg via INTRAVENOUS
  Administered 2011-09-10: 25 mg via INTRAVENOUS
  Administered 2011-09-10: 15 mg via INTRAVENOUS
  Administered 2011-09-10: 40 mg via INTRAVENOUS
  Administered 2011-09-10: 25 mg via INTRAVENOUS
  Administered 2011-09-10: 20 mg via INTRAVENOUS
  Administered 2011-09-10: 25 mg via INTRAVENOUS
  Administered 2011-09-10: 50 mg via INTRAVENOUS
  Administered 2011-09-10: 20 mg via INTRAVENOUS
  Administered 2011-09-10: 40 mg via INTRAVENOUS
  Administered 2011-09-10: 20 mg via INTRAVENOUS

## 2011-09-10 MED ORDER — LACTATED RINGERS IV SOLN: INTRAVENOUS | Status: DC

## 2011-09-10 MED ORDER — PROMETHAZINE HCL 25 MG/ML IJ SOLN: 6.2500 mg | INTRAMUSCULAR | Status: DC | PRN

## 2011-09-10 NOTE — Anesthesia Preprocedure Evaluation (Signed)
Anesthesia Evaluation  Patient identified by MRN, date of birth, ID band Patient awake    Reviewed: Allergy & Precautions, H&P , NPO status , Patient's Chart, lab work & pertinent test results  Airway Mallampati: II TM Distance: >3 FB Neck ROM: Full    Dental No notable dental hx.    Pulmonary asthma ,  breath sounds clear to auscultation  Pulmonary exam normal       Cardiovascular hypertension, Pt. on medications Rhythm:Regular Rate:Normal     Neuro/Psych PSYCHIATRIC DISORDERS Parkinson's disease.  Neuromuscular disease    GI/Hepatic negative GI ROS, Neg liver ROS,   Endo/Other  negative endocrine ROSdiabetes  Renal/GU negative Renal ROS  negative genitourinary   Musculoskeletal negative musculoskeletal ROS (+)   Abdominal   Peds negative pediatric ROS (+)  Hematology negative hematology ROS (+)   Anesthesia Other Findings   Reproductive/Obstetrics negative OB ROS                           Anesthesia Physical Anesthesia Plan  ASA: III  Anesthesia Plan: MAC   Post-op Pain Management:    Induction: Intravenous  Airway Management Planned:   Additional Equipment:   Intra-op Plan:   Post-operative Plan: Extubation in OR  Informed Consent: I have reviewed the patients History and Physical, chart, labs and discussed the procedure including the risks, benefits and alternatives for the proposed anesthesia with the patient or authorized representative who has indicated his/her understanding and acceptance.   Dental advisory given  Plan Discussed with: CRNA  Anesthesia Plan Comments:         Anesthesia Quick Evaluation

## 2011-09-10 NOTE — H&P (Signed)
Patient interval history reviewed.  Patient examined again.  There has been no change from documented H/P dated 09/04/2011 (scanned into chart from our office) except as documented above.  Assessment:  1.  Abdominal pain. 2.  Abnormal CT abdomen, pancreatic head mass with double duct sign (LFTs normal).  Plan:  1.  Endoscopic ultrasound with likely fine needle aspiration biopsies (FNA). 2.  Risks (bleeding, infection, bowel perforation that could require surgery, sedation-related changes in cardiopulmonary systems), benefits (identification and possible treatment of source of symptoms, exclusion of certain causes of symptoms), and alternatives (watchful waiting, radiographic imaging studies, empiric medical treatment) of upper endoscopy with ultrasound and biopsy (EUS +/- FNA) were explained to patient in detail and she wishes to proceed.

## 2011-09-10 NOTE — Op Note (Signed)
[facility] [address] [place], [address]   ENDOSCOPIC ULTRASOUND PROCEDURE REPORT  PATIENT: Tiffany, Hartman  MR#: [id number] BIRTHDATE: 22-Apr-1927  GENDER: Female ENDOSCOPIST: Willis Modena, MD REFERRED BY:  Jonette Eva, M.D. PROCEDURE DATE:  09/10/2011 PROCEDURE:   Upper EUS w/FNA ASA CLASS:      Class III INDICATIONS:   1.  abdominal pain in upper right quadrant.   2. abnormal CT of the GI tract. MEDICATIONS: Cetacaine spray x 2 and MAC sedation, administered by CRNA  DESCRIPTION OF PROCEDURE:   After the risks benefits and alternatives of the procedure were  explained, informed consent was obtained. The patient was then placed in the left, lateral, decubitus postion and IV sedation was administered. Throughout the procedure, the patients blood pressure, pulse and oxygen saturations were monitored continuously.  Under direct visualization, the 0034  endoscope was introduced through the mouth and advanced to the second portion of the duodenum .  Water was used as necessary to provide an acoustic interface.  Upon completion of the imaging, water was removed and the patient was sent to the recovery room in satisfactory condition.    FINDINGS:      Linear EUS scope used.  Scattered hyperechoic strands and foci seen throughout pancreas; no obvious chronic pancreatitis. Pancreatic duct was prominent (  4mm) in the head, tapered to more normal caliber in body and tail.  There was vague hypoechoic region,   13mm x 13mm, at the junction of the ampulla and head of pancreas.  This region appeared fairly well-defined and was not appreciably unusual-appearing.  However, given CT concerns for pancreatic mass in this area, biopsies were obtained (FNA, 25g x 2); preliminary cytology showed reactive and benign-appearing glandular cells.  I could not detect any obvious abnormalities in the head or uncinate pancreas despite extensive evaluation. Intrapancreatic bile  duct was of normal caliber, but became a bit dilated (  12mm) in the extrapancreatic extrahepatic portion.  IMPRESSION:     As above.  No clear tumor in pancreas identified. Hypoechoic region at junction of head/ampulla seen and biopsied, unclear significance, but does not clearly represent solid lesion via EUS appearance and could represent prominent ampullary tissue or anatomic variant (prominent dorsal and ventral anlage).  RECOMMENDATIONS:     1.  Watch for potential complications of procedure. 2.  Await final cytology. 3.  If no malignancy seen on cytology, would consider MRI/MRCP with contrast to better assess pancreatic parenchyma and pancreatic and biliary ductal anatomy.  If "mass" again identified, would have to consider repeat EUS with further FNA biopsies of the periampullary region.  If no "mass" identified, would consider watchful waiting with perhaps interval repeat scan in a few months.   _______________________________ Rosalie DoctorWillis Modena, MD 09/10/2011 2:44 PM   CC:[Carbon Copy]

## 2011-09-10 NOTE — Transfer of Care (Signed)
Immediate Anesthesia Transfer of Care Note  Patient: Tiffany Hartman  Procedure(s) Performed: Procedure(s) (LRB) with comments: UPPER ENDOSCOPIC ULTRASOUND (EUS) RADIAL (N/A) - christina/ebp FINE NEEDLE ASPIRATION (FNA) LINEAR (N/A)  Patient Location: PACU  Anesthesia Type: MAC  Level of Consciousness: awake, sedated and patient cooperative  Airway & Oxygen Therapy: Patient Spontanous Breathing and Patient connected to nasal cannula oxygen  Post-op Assessment: Report given to PACU RN and Post -op Vital signs reviewed and stable  Post vital signs: Reviewed and stable  Complications: No apparent anesthesia complications

## 2011-09-11 ENCOUNTER — Encounter (HOSPITAL_COMMUNITY): Payer: Self-pay | Admitting: Gastroenterology

## 2011-09-11 NOTE — Anesthesia Postprocedure Evaluation (Signed)
  Anesthesia Post-op Note  Patient: Tiffany Hartman  Procedure(s) Performed: Procedure(s) (LRB): UPPER ENDOSCOPIC ULTRASOUND (EUS) RADIAL (N/A) FINE NEEDLE ASPIRATION (FNA) LINEAR (N/A)  Patient Location: PACU  Anesthesia Type: MAC  Level of Consciousness: awake and alert   Airway and Oxygen Therapy: Patient Spontanous Breathing  Post-op Pain: mild  Post-op Assessment: Post-op Vital signs reviewed, Patient's Cardiovascular Status Stable, Respiratory Function Stable, Patent Airway and No signs of Nausea or vomiting  Post-op Vital Signs: stable  Complications: No apparent anesthesia complications

## 2011-09-15 ENCOUNTER — Other Ambulatory Visit: Payer: Self-pay | Admitting: Gastroenterology

## 2011-09-15 DIAGNOSIS — R109 Unspecified abdominal pain: Secondary | ICD-10-CM

## 2011-09-19 ENCOUNTER — Ambulatory Visit
Admission: RE | Admit: 2011-09-19 | Discharge: 2011-09-19 | Disposition: A | Discharge status: Home / Self Care | Payer: Medicare Other | Source: Ambulatory Visit | Attending: Gastroenterology | Admitting: Gastroenterology

## 2011-09-19 DIAGNOSIS — R109 Unspecified abdominal pain: Secondary | ICD-10-CM

## 2011-09-19 DIAGNOSIS — K8689 Other specified diseases of pancreas: Secondary | ICD-10-CM | POA: Diagnosis not present

## 2011-09-19 MED ORDER — GADOBENATE DIMEGLUMINE 529 MG/ML IV SOLN
20.0000 mL | Freq: Once | INTRAVENOUS | Status: AC | PRN
  Administered 2011-09-19: 20 mL via INTRAVENOUS

## 2011-09-20 ENCOUNTER — Other Ambulatory Visit: Payer: Medicare Other

## 2011-09-23 DIAGNOSIS — E042 Nontoxic multinodular goiter: Secondary | ICD-10-CM | POA: Diagnosis not present

## 2011-10-03 DIAGNOSIS — Z23 Encounter for immunization: Secondary | ICD-10-CM | POA: Diagnosis not present

## 2011-10-28 ENCOUNTER — Ambulatory Visit: Payer: Medicare Other | Admitting: Orthopedic Surgery

## 2011-11-12 DIAGNOSIS — R109 Unspecified abdominal pain: Secondary | ICD-10-CM | POA: Diagnosis not present

## 2011-11-12 DIAGNOSIS — R932 Abnormal findings on diagnostic imaging of liver and biliary tract: Secondary | ICD-10-CM | POA: Diagnosis not present

## 2011-11-14 ENCOUNTER — Other Ambulatory Visit (HOSPITAL_COMMUNITY): Payer: Self-pay | Admitting: Internal Medicine

## 2011-11-14 DIAGNOSIS — Z139 Encounter for screening, unspecified: Secondary | ICD-10-CM

## 2011-12-11 ENCOUNTER — Ambulatory Visit (HOSPITAL_COMMUNITY)
Admission: RE | Admit: 2011-12-11 | Discharge: 2011-12-11 | Disposition: A | Discharge status: Home / Self Care | Payer: Medicare Other | Source: Ambulatory Visit | Attending: Internal Medicine | Admitting: Internal Medicine

## 2011-12-11 DIAGNOSIS — Z1231 Encounter for screening mammogram for malignant neoplasm of breast: Secondary | ICD-10-CM | POA: Diagnosis not present

## 2011-12-11 DIAGNOSIS — Z139 Encounter for screening, unspecified: Secondary | ICD-10-CM

## 2012-02-13 DIAGNOSIS — I10 Essential (primary) hypertension: Secondary | ICD-10-CM | POA: Diagnosis not present

## 2012-02-13 DIAGNOSIS — R799 Abnormal finding of blood chemistry, unspecified: Secondary | ICD-10-CM | POA: Diagnosis not present

## 2012-02-13 DIAGNOSIS — R7309 Other abnormal glucose: Secondary | ICD-10-CM | POA: Diagnosis not present

## 2012-02-13 DIAGNOSIS — E119 Type 2 diabetes mellitus without complications: Secondary | ICD-10-CM | POA: Diagnosis not present

## 2012-02-13 DIAGNOSIS — E7800 Pure hypercholesterolemia, unspecified: Secondary | ICD-10-CM | POA: Diagnosis not present

## 2012-02-13 DIAGNOSIS — E039 Hypothyroidism, unspecified: Secondary | ICD-10-CM | POA: Diagnosis not present

## 2012-03-29 DIAGNOSIS — E042 Nontoxic multinodular goiter: Secondary | ICD-10-CM | POA: Diagnosis not present

## 2012-05-17 DIAGNOSIS — I10 Essential (primary) hypertension: Secondary | ICD-10-CM | POA: Diagnosis not present

## 2012-05-17 DIAGNOSIS — E7800 Pure hypercholesterolemia, unspecified: Secondary | ICD-10-CM | POA: Diagnosis not present

## 2012-05-17 DIAGNOSIS — M1990 Unspecified osteoarthritis, unspecified site: Secondary | ICD-10-CM | POA: Diagnosis not present

## 2012-05-17 DIAGNOSIS — E119 Type 2 diabetes mellitus without complications: Secondary | ICD-10-CM | POA: Diagnosis not present

## 2012-05-26 DIAGNOSIS — H35039 Hypertensive retinopathy, unspecified eye: Secondary | ICD-10-CM | POA: Diagnosis not present

## 2012-05-26 DIAGNOSIS — H409 Unspecified glaucoma: Secondary | ICD-10-CM | POA: Diagnosis not present

## 2012-05-26 DIAGNOSIS — H43819 Vitreous degeneration, unspecified eye: Secondary | ICD-10-CM | POA: Diagnosis not present

## 2012-05-26 DIAGNOSIS — H4011X0 Primary open-angle glaucoma, stage unspecified: Secondary | ICD-10-CM | POA: Diagnosis not present

## 2012-05-26 DIAGNOSIS — H47239 Glaucomatous optic atrophy, unspecified eye: Secondary | ICD-10-CM | POA: Diagnosis not present

## 2012-06-02 ENCOUNTER — Ambulatory Visit (INDEPENDENT_AMBULATORY_CARE_PROVIDER_SITE_OTHER): Payer: Medicare Other

## 2012-06-02 ENCOUNTER — Ambulatory Visit (INDEPENDENT_AMBULATORY_CARE_PROVIDER_SITE_OTHER): Payer: Medicare Other | Admitting: Orthopedic Surgery

## 2012-06-02 ENCOUNTER — Encounter: Payer: Self-pay | Admitting: Orthopedic Surgery

## 2012-06-02 VITALS — BP 128/66 | Ht 65.5 in | Wt 229.0 lb

## 2012-06-02 DIAGNOSIS — M25561 Pain in right knee: Secondary | ICD-10-CM

## 2012-06-02 DIAGNOSIS — M5137 Other intervertebral disc degeneration, lumbosacral region: Secondary | ICD-10-CM

## 2012-06-02 DIAGNOSIS — M25569 Pain in unspecified knee: Secondary | ICD-10-CM | POA: Insufficient documentation

## 2012-06-02 DIAGNOSIS — M25559 Pain in unspecified hip: Secondary | ICD-10-CM | POA: Diagnosis not present

## 2012-06-02 DIAGNOSIS — M5136 Other intervertebral disc degeneration, lumbar region: Secondary | ICD-10-CM | POA: Insufficient documentation

## 2012-06-02 DIAGNOSIS — M5430 Sciatica, unspecified side: Secondary | ICD-10-CM | POA: Diagnosis not present

## 2012-06-02 DIAGNOSIS — M25551 Pain in right hip: Secondary | ICD-10-CM

## 2012-06-02 DIAGNOSIS — M51379 Other intervertebral disc degeneration, lumbosacral region without mention of lumbar back pain or lower extremity pain: Secondary | ICD-10-CM

## 2012-06-02 DIAGNOSIS — IMO0002 Reserved for concepts with insufficient information to code with codable children: Secondary | ICD-10-CM

## 2012-06-02 DIAGNOSIS — M7050 Other bursitis of knee, unspecified knee: Secondary | ICD-10-CM | POA: Insufficient documentation

## 2012-06-02 DIAGNOSIS — M5431 Sciatica, right side: Secondary | ICD-10-CM

## 2012-06-02 MED ORDER — IBUPROFEN 800 MG PO TABS: 800.0000 mg | ORAL_TABLET | Freq: Three times a day (TID) | ORAL | Status: DC | PRN

## 2012-06-02 MED ORDER — HYDROCODONE-ACETAMINOPHEN 5-325 MG PO TABS: 1.0000 | ORAL_TABLET | ORAL | Status: DC | PRN

## 2012-06-02 MED ORDER — GABAPENTIN 300 MG PO CAPS: 300.0000 mg | ORAL_CAPSULE | Freq: Three times a day (TID) | ORAL | Status: DC

## 2012-06-02 MED ORDER — METHYLPREDNISOLONE 4 MG PO KIT: PACK | ORAL | Status: DC

## 2012-06-02 NOTE — Progress Notes (Signed)
Patient ID: Tiffany Hartman, female   DOB: October 15, 1927, 79 y.o.   MRN: [id number] Chief Complaint  Patient presents with  . Knee Pain    Right knee pain. Referred by Dr. [name], right hip pain    History this patient is 79 years old she had her right and left total knee replacement she had a right periprosthetic fracture treated with open treatment internal fixation she's done well until about December of last year she had some back pain was treated with a Medrol Dosepak did well  About a month ago she started having right leg pain radiating from her hip down into her lower leg associated with knee swelling over the medial bursa. Her pain came on suddenly is described as sharp stabbing 8/10 worse with walking and standing associated with numbness and tingling. She says her knee pain improved with ice and rest her swelling is over the medial pes anserine bursa.  However, she still having right hip pain radiating down her leg associated with numbness and tingling. She is ambulating with a cane.  She's gained some weight her eyes water she has glaucoma she complains of some shortness of breath wheezing and cough she also complains of snoring constipation nervousness depression dizziness and tremors temperature intolerance and seasonal allergies. Her medical problems include hypertension, diabetes, bronchitis, arthritis, glaucoma   Past Surgical History  Procedure Laterality Date  . Abdominal hysterectomy      complete  . Joint replacement  1998/1999    knee boths  . Mastectomy  02/20/1983    left   . Fracture surgery  2008     right and left femurs  . Appendectomy    .  bilateral catracts    . Eus  09/10/2011    Procedure: UPPER ENDOSCOPIC ULTRASOUND (EUS) RADIAL;  Surgeon: Willis Modena, MD;  Location: [facility];  Service: Endoscopy;  Laterality: N/A;  christina/ebp  . Fine needle aspiration  09/10/2011    Procedure: FINE NEEDLE ASPIRATION (FNA) LINEAR;  Surgeon: Willis Modena, MD;  Location: [facility]  ENDOSCOPY;  Service: Endoscopy;  Laterality: N/A;  . Knee surgery Right     TKA  . Knee surgery Left     TKA  . Knee surgery Right     Periprosthetic fracture /otif    BP 128/66  Ht 5' 5.5" (1.664 m)  Wt 229 lb (103.874 kg)  BMI 37.51 kg/m2 She is endomorphic body habitus she is well-groomed no developmental abnormalities  She has some mild peripheral edema extremities are warm to touch she is ambulating  with a lot of difficulty with a cane. she has surgical scars over the right knee anterior and lateral from the surgeries mentioned she has tenderness over the pes bursa no joint effusion her knee flexion is 85 she has an intact straight leg raise the knee is stable in extension and has normal anteroposterior stability muscle tone is normal  She is oriented x3 her mood and affect are depressed and flat  She has tenderness in her lower back right gluteal area and right sciatic nerve  X-rays show mild hip arthritis probable wear of the polyethylene insert but is stable prosthetic knee with some mild anterior since he at the anterior flange of the femur  Encounter Diagnoses  Name Primary?  . Knee pain, right Yes  . Hip pain, right   . DDD (degenerative disc disease), lumbar   . Sciatica neuralgia, right    Her acute problem though is related to her back and  sciatic nerve we will treat her with gabapentin 10 mg every 8 ibuprofen 800 mg every 8 Norco 5 mg every 4 and a Medrol Dosepak.

## 2012-06-02 NOTE — Patient Instructions (Addendum)
Encounter Diagnoses  Name Primary?  . Knee pain, right   . Hip pain, right   . DDD (degenerative disc disease), lumbar   . Sciatica neuralgia, right Yes  . Pes anserinus bursitis    You have received a steroid shot. 15% of patients experience increased pain at the injection site with in the next 24 hours. This is best treated with ice and tylenol extra strength 2 tabs every 8 hours. If you are still having pain please call the office.   3 of  your prescriptions are at the pharmacy he have a prescription for Norco which is a pain medication take 1 every 4 hours when necessary

## 2012-06-07 ENCOUNTER — Ambulatory Visit: Payer: Medicare Other | Admitting: Orthopedic Surgery

## 2012-08-10 ENCOUNTER — Inpatient Hospital Stay (HOSPITAL_COMMUNITY)
Admission: AD | Admit: 2012-08-10 | Discharge: 2012-08-16 | DRG: 069 | Disposition: A | Discharge status: Home Health | Payer: Medicare Other | Source: Ambulatory Visit | Attending: Internal Medicine | Admitting: Internal Medicine

## 2012-08-10 ENCOUNTER — Encounter (HOSPITAL_COMMUNITY): Payer: Self-pay | Admitting: *Deleted

## 2012-08-10 ENCOUNTER — Inpatient Hospital Stay (HOSPITAL_COMMUNITY): Payer: Medicare Other

## 2012-08-10 DIAGNOSIS — I10 Essential (primary) hypertension: Secondary | ICD-10-CM | POA: Diagnosis not present

## 2012-08-10 DIAGNOSIS — R1310 Dysphagia, unspecified: Secondary | ICD-10-CM | POA: Diagnosis present

## 2012-08-10 DIAGNOSIS — R200 Anesthesia of skin: Secondary | ICD-10-CM | POA: Diagnosis present

## 2012-08-10 DIAGNOSIS — G20 Parkinson's disease: Secondary | ICD-10-CM | POA: Diagnosis present

## 2012-08-10 DIAGNOSIS — Z8673 Personal history of transient ischemic attack (TIA), and cerebral infarction without residual deficits: Secondary | ICD-10-CM

## 2012-08-10 DIAGNOSIS — Z79899 Other long term (current) drug therapy: Secondary | ICD-10-CM | POA: Diagnosis not present

## 2012-08-10 DIAGNOSIS — G40909 Epilepsy, unspecified, not intractable, without status epilepticus: Secondary | ICD-10-CM | POA: Diagnosis not present

## 2012-08-10 DIAGNOSIS — E785 Hyperlipidemia, unspecified: Secondary | ICD-10-CM | POA: Diagnosis present

## 2012-08-10 DIAGNOSIS — I6340 Cerebral infarction due to embolism of unspecified cerebral artery: Secondary | ICD-10-CM | POA: Diagnosis not present

## 2012-08-10 DIAGNOSIS — H409 Unspecified glaucoma: Secondary | ICD-10-CM | POA: Diagnosis present

## 2012-08-10 DIAGNOSIS — R209 Unspecified disturbances of skin sensation: Secondary | ICD-10-CM

## 2012-08-10 DIAGNOSIS — F0390 Unspecified dementia without behavioral disturbance: Secondary | ICD-10-CM | POA: Diagnosis present

## 2012-08-10 DIAGNOSIS — Z96659 Presence of unspecified artificial knee joint: Secondary | ICD-10-CM

## 2012-08-10 DIAGNOSIS — M1990 Unspecified osteoarthritis, unspecified site: Secondary | ICD-10-CM | POA: Diagnosis present

## 2012-08-10 DIAGNOSIS — E669 Obesity, unspecified: Secondary | ICD-10-CM | POA: Diagnosis present

## 2012-08-10 DIAGNOSIS — R9389 Abnormal findings on diagnostic imaging of other specified body structures: Secondary | ICD-10-CM | POA: Diagnosis not present

## 2012-08-10 DIAGNOSIS — Z8679 Personal history of other diseases of the circulatory system: Secondary | ICD-10-CM

## 2012-08-10 DIAGNOSIS — G459 Transient cerebral ischemic attack, unspecified: Secondary | ICD-10-CM | POA: Diagnosis not present

## 2012-08-10 DIAGNOSIS — G8194 Hemiplegia, unspecified affecting left nondominant side: Secondary | ICD-10-CM | POA: Diagnosis present

## 2012-08-10 DIAGNOSIS — J45909 Unspecified asthma, uncomplicated: Secondary | ICD-10-CM | POA: Diagnosis present

## 2012-08-10 DIAGNOSIS — H02409 Unspecified ptosis of unspecified eyelid: Secondary | ICD-10-CM | POA: Diagnosis present

## 2012-08-10 DIAGNOSIS — R471 Dysarthria and anarthria: Secondary | ICD-10-CM | POA: Diagnosis present

## 2012-08-10 DIAGNOSIS — R42 Dizziness and giddiness: Secondary | ICD-10-CM | POA: Diagnosis not present

## 2012-08-10 DIAGNOSIS — R269 Unspecified abnormalities of gait and mobility: Secondary | ICD-10-CM | POA: Diagnosis not present

## 2012-08-10 DIAGNOSIS — I6350 Cerebral infarction due to unspecified occlusion or stenosis of unspecified cerebral artery: Secondary | ICD-10-CM | POA: Diagnosis not present

## 2012-08-10 DIAGNOSIS — R569 Unspecified convulsions: Secondary | ICD-10-CM | POA: Diagnosis not present

## 2012-08-10 DIAGNOSIS — Z853 Personal history of malignant neoplasm of breast: Secondary | ICD-10-CM

## 2012-08-10 DIAGNOSIS — J209 Acute bronchitis, unspecified: Secondary | ICD-10-CM | POA: Diagnosis not present

## 2012-08-10 DIAGNOSIS — E119 Type 2 diabetes mellitus without complications: Secondary | ICD-10-CM | POA: Diagnosis not present

## 2012-08-10 DIAGNOSIS — R4789 Other speech disturbances: Secondary | ICD-10-CM | POA: Diagnosis not present

## 2012-08-10 DIAGNOSIS — R29818 Other symptoms and signs involving the nervous system: Secondary | ICD-10-CM | POA: Diagnosis not present

## 2012-08-10 DIAGNOSIS — Z9010 Acquired absence of unspecified breast and nipple: Secondary | ICD-10-CM

## 2012-08-10 DIAGNOSIS — J410 Simple chronic bronchitis: Secondary | ICD-10-CM | POA: Diagnosis not present

## 2012-08-10 DIAGNOSIS — G8190 Hemiplegia, unspecified affecting unspecified side: Secondary | ICD-10-CM | POA: Diagnosis not present

## 2012-08-10 DIAGNOSIS — I369 Nonrheumatic tricuspid valve disorder, unspecified: Secondary | ICD-10-CM | POA: Diagnosis not present

## 2012-08-10 DIAGNOSIS — I658 Occlusion and stenosis of other precerebral arteries: Secondary | ICD-10-CM | POA: Diagnosis not present

## 2012-08-10 DIAGNOSIS — G9389 Other specified disorders of brain: Secondary | ICD-10-CM | POA: Diagnosis not present

## 2012-08-10 DIAGNOSIS — G20A1 Parkinson's disease without dyskinesia, without mention of fluctuations: Secondary | ICD-10-CM | POA: Diagnosis present

## 2012-08-10 LAB — CBC
HCT: 37 % (ref 36.0–46.0)
MCH: 29.2 pg (ref 26.0–34.0)
MCV: 90.7 fL (ref 78.0–100.0)
Platelets: 319 10*3/uL (ref 150–400)
RBC: 4.08 MIL/uL (ref 3.87–5.11)

## 2012-08-10 LAB — COMPREHENSIVE METABOLIC PANEL
AST: 13 U/L (ref 0–37)
BUN: 12 mg/dL (ref 6–23)
CO2: 28 mEq/L (ref 19–32)
Calcium: 9 mg/dL (ref 8.4–10.5)
Chloride: 103 mEq/L (ref 96–112)
Creatinine, Ser: 1.04 mg/dL (ref 0.50–1.10)
GFR calc Af Amer: 56 mL/min — ABNORMAL LOW (ref >90)
GFR calc non Af Amer: 48 mL/min — ABNORMAL LOW (ref >90)
Total Bilirubin: 0.3 mg/dL (ref 0.3–1.2)

## 2012-08-10 MED ORDER — SENNOSIDES-DOCUSATE SODIUM 8.6-50 MG PO TABS: 1.0000 | ORAL_TABLET | Freq: Every evening | ORAL | Status: DC | PRN

## 2012-08-10 MED ORDER — MECLIZINE HCL 12.5 MG PO TABS
25.0000 mg | ORAL_TABLET | Freq: Four times a day (QID) | ORAL | Status: DC | PRN
  Administered 2012-08-11 – 2012-08-12 (×3): 25 mg via ORAL
  Filled 2012-08-10: qty 1
  Filled 2012-08-10 (×3): qty 2

## 2012-08-10 MED ORDER — FUROSEMIDE 20 MG PO TABS
20.0000 mg | ORAL_TABLET | Freq: Every day | ORAL | Status: DC
  Administered 2012-08-11 – 2012-08-16 (×6): 20 mg via ORAL
  Filled 2012-08-10 (×7): qty 1

## 2012-08-10 MED ORDER — PANTOPRAZOLE SODIUM 40 MG PO TBEC
40.0000 mg | DELAYED_RELEASE_TABLET | Freq: Every day | ORAL | Status: DC
  Administered 2012-08-11 – 2012-08-16 (×6): 40 mg via ORAL
  Filled 2012-08-10 (×6): qty 1

## 2012-08-10 MED ORDER — ALPRAZOLAM 0.25 MG PO TABS
0.2500 mg | ORAL_TABLET | Freq: Two times a day (BID) | ORAL | Status: DC
  Administered 2012-08-10 – 2012-08-14 (×8): 0.25 mg via ORAL
  Filled 2012-08-10 (×8): qty 1

## 2012-08-10 MED ORDER — FOLIC ACID 1 MG PO TABS
1.0000 mg | ORAL_TABLET | Freq: Every day | ORAL | Status: DC
  Administered 2012-08-11 – 2012-08-16 (×6): 1 mg via ORAL
  Filled 2012-08-10 (×6): qty 1

## 2012-08-10 MED ORDER — ACETAMINOPHEN 325 MG PO TABS
650.0000 mg | ORAL_TABLET | ORAL | Status: DC | PRN
  Administered 2012-08-11 – 2012-08-14 (×2): 650 mg via ORAL
  Filled 2012-08-10 (×2): qty 2

## 2012-08-10 MED ORDER — DONEPEZIL HCL 5 MG PO TABS
5.0000 mg | ORAL_TABLET | Freq: Every day | ORAL | Status: DC
  Administered 2012-08-11 – 2012-08-16 (×6): 5 mg via ORAL
  Filled 2012-08-10 (×6): qty 1

## 2012-08-10 MED ORDER — CLOPIDOGREL BISULFATE 75 MG PO TABS
75.0000 mg | ORAL_TABLET | Freq: Every day | ORAL | Status: DC
  Administered 2012-08-11 – 2012-08-16 (×6): 75 mg via ORAL
  Filled 2012-08-10 (×6): qty 1

## 2012-08-10 MED ORDER — ALBUTEROL SULFATE (5 MG/ML) 0.5% IN NEBU
2.5000 mg | INHALATION_SOLUTION | RESPIRATORY_TRACT | Status: DC | PRN
  Administered 2012-08-11 – 2012-08-14 (×2): 2.5 mg via RESPIRATORY_TRACT
  Filled 2012-08-10 (×2): qty 0.5

## 2012-08-10 MED ORDER — FLUOXETINE HCL 20 MG PO CAPS
20.0000 mg | ORAL_CAPSULE | Freq: Every day | ORAL | Status: DC
  Administered 2012-08-10 – 2012-08-15 (×6): 20 mg via ORAL
  Filled 2012-08-10 (×6): qty 1

## 2012-08-10 MED ORDER — HYDROCODONE-ACETAMINOPHEN 5-325 MG PO TABS
1.0000 | ORAL_TABLET | ORAL | Status: DC | PRN
  Administered 2012-08-11 – 2012-08-14 (×5): 1 via ORAL
  Filled 2012-08-10 (×5): qty 1

## 2012-08-10 MED ORDER — GABAPENTIN 300 MG PO CAPS
300.0000 mg | ORAL_CAPSULE | Freq: Three times a day (TID) | ORAL | Status: DC
  Administered 2012-08-10 – 2012-08-13 (×9): 300 mg via ORAL
  Filled 2012-08-10 (×9): qty 1

## 2012-08-10 MED ORDER — LEVETIRACETAM 500 MG PO TABS
250.0000 mg | ORAL_TABLET | Freq: Two times a day (BID) | ORAL | Status: DC
  Administered 2012-08-10 – 2012-08-13 (×6): 250 mg via ORAL
  Filled 2012-08-10 (×6): qty 1

## 2012-08-10 MED ORDER — SIMVASTATIN 10 MG PO TABS
5.0000 mg | ORAL_TABLET | Freq: Every day | ORAL | Status: DC
  Administered 2012-08-11 – 2012-08-15 (×5): 5 mg via ORAL
  Filled 2012-08-10 (×5): qty 1

## 2012-08-10 MED ORDER — SODIUM CHLORIDE 0.9 % IV SOLN
INTRAVENOUS | Status: DC
Start: 2012-08-10 — End: 2012-08-12
  Administered 2012-08-11: 12:00:00 via INTRAVENOUS
  Administered 2012-08-12: 1 mL via INTRAVENOUS

## 2012-08-10 MED ORDER — ACETAMINOPHEN 650 MG RE SUPP: 650.0000 mg | RECTAL | Status: DC | PRN

## 2012-08-10 MED ORDER — IRBESARTAN 300 MG PO TABS
300.0000 mg | ORAL_TABLET | Freq: Every day | ORAL | Status: DC
  Administered 2012-08-11 – 2012-08-16 (×6): 300 mg via ORAL
  Filled 2012-08-10 (×6): qty 1

## 2012-08-10 MED ORDER — FERROUS SULFATE 325 (65 FE) MG PO TABS
325.0000 mg | ORAL_TABLET | Freq: Every day | ORAL | Status: DC
  Administered 2012-08-11 – 2012-08-16 (×6): 325 mg via ORAL
  Filled 2012-08-10 (×6): qty 1

## 2012-08-10 MED ORDER — AMLODIPINE BESYLATE 5 MG PO TABS
5.0000 mg | ORAL_TABLET | Freq: Every day | ORAL | Status: DC
  Administered 2012-08-11 – 2012-08-16 (×6): 5 mg via ORAL
  Filled 2012-08-10 (×6): qty 1

## 2012-08-10 MED ORDER — HEPARIN SODIUM (PORCINE) 5000 UNIT/ML IJ SOLN
5000.0000 [IU] | Freq: Three times a day (TID) | INTRAMUSCULAR | Status: DC
  Administered 2012-08-10 – 2012-08-16 (×17): 5000 [IU] via SUBCUTANEOUS
  Filled 2012-08-10 (×17): qty 1

## 2012-08-10 NOTE — H&P (Signed)
[facility] History and Physical  Tiffany Hartman RUE:[id number] DOB: Oct 01, 1927 DOA: 08/10/2012  Referring physician: Dr. Gerilyn Pilgrim PCP: Avon Gully, MD  Specialists: Neurologist: Dr. Gerilyn Pilgrim  Chief Complaint: left sided numbness  HPI: Tiffany Hartman is a 79 y.o. female with a prior history of stroke approximately 7-8 years ago with no residual effects. Patient was in her usual state of health on Thursday she developed left-sided numbness in her face, upper and lower extremities. This is associated with left-sided weakness. She started to develop some dysarthria. She also had significant problems with ataxia. Her symptoms began to improve the following day and therefore she did not seek medical attention. This morning, she reports that her symptoms have recurred and were progressively getting worse. She had gone to see a neurologist for evaluation. She denies any chest pain, fever, dysuria, diarrhea. She does have nausea but denies vomiting. She denies any double vision/blurred vision. She does describe some dysphagia and felt like she was choking when she was eating and drinking. She described significant gait disturbance is due to her dizziness/ataxia. She has had chronic cough and describes a chronic bronchitis. She has been using nebulizer treatments at home. She was evaluated at her neurologist's office today and was directly admitted to the hospital for further evaluation and treatment. Due to delay in patient arrival, she is out of the window for any therapeutic treatments including TPA.  Review of Systems: Pertinent positives as per history of present illness, otherwise negative  Past Medical History  Diagnosis Date  . Dementia   . Bronchitis   . Asthma   . Diabetes mellitus   . Parkinson disease   . HTN (hypertension)   . OA (osteoarthritis)   . Thyroid nodule     Dr [name]  . Glaucoma   . Diverticulitis 2009  . Breast cancer 02/20/83    LT mastectomy   Past Surgical History   Procedure Laterality Date  . Abdominal hysterectomy      complete  . Joint replacement  1998/1999    knee boths  . Mastectomy  02/20/1983    left   . Fracture surgery  2008     right and left femurs  . Appendectomy    .  bilateral catracts    . Eus  09/10/2011    Procedure: UPPER ENDOSCOPIC ULTRASOUND (EUS) RADIAL;  Surgeon: Willis Modena, MD;  Location: [facility];  Service: Endoscopy;  Laterality: N/A;  christina/ebp  . Fine needle aspiration  09/10/2011    Procedure: FINE NEEDLE ASPIRATION (FNA) LINEAR;  Surgeon: Willis Modena, MD;  Location: [facility];  Service: Endoscopy;  Laterality: N/A;  . Knee surgery Right     TKA  . Knee surgery Left     TKA  . Knee surgery Right     Periprosthetic fracture Donata Clay   Social History:  reports that she has never smoked. She does not have any smokeless tobacco history on file. She reports that she does not drink alcohol or use illicit drugs.   No Known Allergies  Family History  Problem Relation Age of Onset  . Colon polyps Daughter 30  . Stroke Father   . Huntington's disease Mother   . Throat cancer Son     Prior to Admission medications   Medication Sig Start Date End Date Taking? Authorizing Provider  albuterol (PROVENTIL HFA;VENTOLIN HFA) 108 (90 BASE) MCG/ACT inhaler Inhale 1-2 puffs into the lungs every 6 (six) hours as needed for wheezing. 07/16/11 07/15/12  Glynn Octave, MD  ALPRAZolam (XANAX) 0.25 MG tablet Take 0.25 mg by mouth 2 (two) times daily.    Historical Provider, MD  amLODipine (NORVASC) 5 MG tablet Take 5 mg by mouth daily.    Historical Provider, MD  aspirin EC 81 MG tablet Take 81 mg by mouth daily.    Historical Provider, MD  Calcium Carbonate-Vitamin D (CALCIUM + D) 600-200 MG-UNIT TABS Take 1 tablet by mouth every evening.     Historical Provider, MD  donepezil (ARICEPT) 5 MG tablet Take 5 mg by mouth daily.    Historical Provider, MD  ferrous sulfate 325 (65 FE) MG tablet Take 325 mg by mouth daily.     Historical Provider, MD  FLUoxetine (PROZAC) 20 MG capsule Take 20 mg by mouth daily.    Historical Provider, MD  folic acid (FOLVITE) 1 MG tablet Take 1 mg by mouth daily.    Historical Provider, MD  furosemide (LASIX) 20 MG tablet Take 20 mg by mouth daily.    Historical Provider, MD  gabapentin (NEURONTIN) 300 MG capsule Take 1 capsule (300 mg total) by mouth 3 (three) times daily. 06/02/12   Vickki Hearing, MD  HYDROcodone-acetaminophen (NORCO/VICODIN) 5-325 MG per tablet Take 1 tablet by mouth every 4 (four) hours as needed for pain. 06/02/12   Vickki Hearing, MD  ibuprofen (ADVIL,MOTRIN) 800 MG tablet Take 1 tablet (800 mg total) by mouth every 8 (eight) hours as needed for pain. 06/02/12   Vickki Hearing, MD  levETIRAcetam (KEPPRA) 250 MG tablet Take 250 mg by mouth 2 (two) times daily.    Historical Provider, MD  lovastatin (MEVACOR) 10 MG tablet Take 10 mg by mouth at bedtime.    Historical Provider, MD  meclizine (ANTIVERT) 25 MG tablet Take 25 mg by mouth every 6 (six) hours as needed. dizziness    Historical Provider, MD  methylPREDNISolone (MEDROL, PAK,) 4 MG tablet follow package directions 06/02/12   Vickki Hearing, MD  olmesartan (BENICAR) 40 MG tablet Take 40 mg by mouth daily.    Historical Provider, MD  omeprazole (PRILOSEC) 20 MG capsule Take 20 mg by mouth daily.    Historical Provider, MD  potassium chloride SA (K-DUR,KLOR-CON) 20 MEQ tablet Take 0.5 tablets (10 mEq total) by mouth daily. 04/30/11 04/29/12  Avon Gully, MD   Physical Exam: There were no vitals filed for this visit.   General:  No acute distress  Eyes: Pupils are equal round reactive to light  ENT: Mucous membranes are moist  Neck: Supple  Cardiovascular: S1, S2, regular rate and rhythm  Respiratory: Clear to auscultation bilaterally  Abdomen: Soft, nontender, positive bowel sounds  Skin: No rashes  Musculoskeletal: No pedal edema bilaterally  Psychiatric: Normal affect,  frustrated with speech, cooperative with exam  Neurologic: Patient has a mild left-sided hemiparesis with 4/5 strength in the left upper and lower strategies. Strength is 5/5 on the right side. She does not have any facial asymmetry. She does have a significant dysarthria where she continuously stutters. Gait was not checked.  Labs on Admission:  Basic Metabolic Panel: No results found for this basename: NA, K, CL, CO2, GLUCOSE, BUN, CREATININE, CALCIUM, MG, PHOS,  in the last 168 hours Liver Function Tests: No results found for this basename: AST, ALT, ALKPHOS, BILITOT, PROT, ALBUMIN,  in the last 168 hours No results found for this basename: LIPASE, AMYLASE,  in the last 168 hours No results found for this basename: AMMONIA,  in the last 168 hours CBC: No  results found for this basename: WBC, NEUTROABS, HGB, HCT, MCV, PLT,  in the last 168 hours Cardiac Enzymes: No results found for this basename: CKTOTAL, CKMB, CKMBINDEX, TROPONINI,  in the last 168 hours  BNP (last 3 results) No results found for this basename: PROBNP,  in the last 8760 hours CBG: No results found for this basename: GLUCAP,  in the last 168 hours  Radiological Exams on Admission: No results found.   Assessment/Plan Principal Problem:   Left sided numbness Active Problems:   HIGH BLOOD PRESSURE   History of stroke   Parkinsons disease   Left hemiparesis   Dysarthria   Seizure disorder   Other and unspecified hyperlipidemia   1. Left-sided weakness and numbness, possible CVA. Patient will be admitted to a telemetry bed. She will undergo head CT, MRI of brain. Carotid Dopplers and 2-D echocardiogram will also be performed. We'll check lipid panel and hemoglobin A1c. We will consult neurology for further management. She reports taking a daily aspirin. This will be discontinued in favor of Plavix. Physical therapy, speech therapy will be ordered. 2. Hypertension. Continue outpatient regimen. 3. Seizure  disorder. Continue Keppra. 4. Parkinson's disease. Continue outpatient regimen.  Patient will receive basic labs and further orders as condition evolves. She'll be admitted to the service of Dr. [name]. [facility] will be available for any issues until 7 AM on 8/6.    Code Status: full code Family Communication: discussed with patient and children at bedside Disposition Plan: pending hospital course  Time spent:  [facility] Pager [phone]  If 7PM-7AM, please contact night-coverage [email or web address] Password [facility] 08/10/2012, 5:48 PM

## 2012-08-11 ENCOUNTER — Inpatient Hospital Stay (HOSPITAL_COMMUNITY): Payer: Medicare Other

## 2012-08-11 DIAGNOSIS — I369 Nonrheumatic tricuspid valve disorder, unspecified: Secondary | ICD-10-CM

## 2012-08-11 DIAGNOSIS — G9389 Other specified disorders of brain: Secondary | ICD-10-CM | POA: Diagnosis not present

## 2012-08-11 DIAGNOSIS — I658 Occlusion and stenosis of other precerebral arteries: Secondary | ICD-10-CM | POA: Diagnosis not present

## 2012-08-11 DIAGNOSIS — R29818 Other symptoms and signs involving the nervous system: Secondary | ICD-10-CM | POA: Diagnosis not present

## 2012-08-11 DIAGNOSIS — I10 Essential (primary) hypertension: Secondary | ICD-10-CM | POA: Diagnosis not present

## 2012-08-11 DIAGNOSIS — G459 Transient cerebral ischemic attack, unspecified: Secondary | ICD-10-CM | POA: Diagnosis not present

## 2012-08-11 DIAGNOSIS — I6340 Cerebral infarction due to embolism of unspecified cerebral artery: Secondary | ICD-10-CM | POA: Diagnosis not present

## 2012-08-11 DIAGNOSIS — E119 Type 2 diabetes mellitus without complications: Secondary | ICD-10-CM | POA: Diagnosis not present

## 2012-08-11 DIAGNOSIS — J410 Simple chronic bronchitis: Secondary | ICD-10-CM | POA: Diagnosis not present

## 2012-08-11 DIAGNOSIS — R569 Unspecified convulsions: Secondary | ICD-10-CM | POA: Diagnosis not present

## 2012-08-11 LAB — BASIC METABOLIC PANEL
CO2: 26 mEq/L (ref 19–32)
Calcium: 8.7 mg/dL (ref 8.4–10.5)
Chloride: 105 mEq/L (ref 96–112)
Glucose, Bld: 106 mg/dL — ABNORMAL HIGH (ref 70–99)
Potassium: 3.8 mEq/L (ref 3.5–5.1)
Sodium: 140 mEq/L (ref 135–145)

## 2012-08-11 LAB — LIPID PANEL
Cholesterol: 163 mg/dL (ref 0–200)
LDL Cholesterol: 91 mg/dL (ref 0–99)
Total CHOL/HDL Ratio: 3.6 RATIO
VLDL: 27 mg/dL (ref 0–40)

## 2012-08-11 LAB — CBC
Hemoglobin: 11.6 g/dL — ABNORMAL LOW (ref 12.0–15.0)
MCH: 28.3 pg (ref 26.0–34.0)
Platelets: 333 10*3/uL (ref 150–400)
RBC: 4.1 MIL/uL (ref 3.87–5.11)
WBC: 7.9 10*3/uL (ref 4.0–10.5)

## 2012-08-11 NOTE — Progress Notes (Signed)
.  ngSubjective:  Patient was admitted yesterday due to lt side numbness and weakness. No chest pain, syncopal episode or seizure activity. CT scan showed only old infarct.  Objective: Vital signs in last 24 hours: Temp:  [98.1 F (36.7 C)-98.8 F (37.1 C)] 98.8 F (37.1 C) (08/06 0612) Pulse Rate:  [73-84] 73 (08/06 0742) Resp:  [20] 20 (08/06 0612) BP: (115-137)/(67-79) 133/72 mmHg (08/06 0612) SpO2:  [92 %-98 %] 92 % (08/06 0742) Weight:  [104.5 kg (230 lb 6.1 oz)] 104.5 kg (230 lb 6.1 oz) (08/05 2009) Weight change:  Last BM Date: 08/09/12  Intake/Output from previous day:    PHYSICAL EXAM General appearance: alert and no distress Resp: clear to auscultation bilaterally Cardio: S1, S2 normal GI: soft, non-tender; bowel sounds normal; no masses,  no organomegaly Extremities: extremities normal, atraumatic, no cyanosis or edema  Lab Results:    @labtest @ [facility] No results found for this basename: PHART, PCO2, PO2ART, TCO2, HCO3,  in the last 72 hours CULTURES No results found for this or any previous visit (from the past 240 hour(s)). Studies/Results: Dg Chest 2 View  08/10/2012   *RADIOLOGY REPORT*  Clinical Data: History of breast cancer  CHEST - 2 VIEW  Comparison: July 16, 2011  Findings: There is no focal infiltrate, pulmonary edema, or pleural effusion.  Mediastinal contour and cardiac silhouette are stable. The soft tissues and osseous structures are stable.  IMPRESSION: No acute cardiopulmonary disease identified.   Original Report Authenticated By: Sherian Rein, M.D.   Ct Head Wo Contrast  08/10/2012   *RADIOLOGY REPORT*  Clinical Data: Stroke.  Slurred speech.  CT HEAD WITHOUT CONTRAST  Technique:  Contiguous axial images were obtained from the base of the skull through the vertex without contrast.  Comparison: CT scan dated 09/22/2009  Findings: There is no acute intracranial hemorrhage, infarction, or mass lesion.  There are multiple old small lacunar infarcts  including the most prominent area in the right centrum semiovale, unchanged.  No ventricular dilatation.  No acute osseous abnormality.  IMPRESSION: No acute abnormalities.  Multiple old infarcts.   Original Report Authenticated By: Francene Boyers, M.D.    Medications: I have reviewed the patient's current medications.  Assesment:  Principal Problem:   Left sided numbness Active Problems:   HIGH BLOOD PRESSURE   History of stroke   Parkinsons disease   Left hemiparesis   Dysarthria   Seizure disorder   Other and unspecified hyperlipidemia    Plan: Continue telemetry MRI and carotid doppler pending Neurology consult.    LOS: 1 day   Tiffany Hartman 08/11/2012, 7:51 AM

## 2012-08-11 NOTE — Progress Notes (Signed)
UR chart review completed.  

## 2012-08-11 NOTE — Progress Notes (Signed)
Upon assessment pts IV site was edematous secondary to infiltration from right wrist to [facility]. Pt stated she had complained of pain from the IV from the time of insertion. Pt had a 1cm blister under tegaderm dressing, pt stated she from her knowledge she was not allergic to adhesive. Due to the edema to pts arm, a 0.5cm skin tear occurred during the removal of the tegaderm. Pink foam dressing applied to skin tear. Pt stable. Will continue to monitor.

## 2012-08-11 NOTE — Care Management Note (Signed)
    Page 1 of 1   08/16/2012     9:40:54 AM   CARE MANAGEMENT NOTE 08/16/2012  Patient:  Tiffany Hartman, Tiffany Hartman   Account Number:  [id number]  Date Initiated:  08/11/2012  Documentation initiated by:  Sharrie Rothman  Subjective/Objective Assessment:   Pt admitted from home with CVA. Pt has a daughter that lives with her and another daughter and son that live next door to pt. Pt has a quad cane, walker, and w/c for home use.     Action/Plan:   PT recommends HH PT/OT. Pt would like to speak with daughter before agreeing to a [facility] agency. Will continue to follow.   Anticipated DC Date:  08/12/2012   Anticipated DC Plan:  HOME W HOME HEALTH SERVICES      DC Planning Services  CM consult      Choice offered to / List presented to:             Status of service:  Completed, signed off Medicare Important Message given?  YES (If response is "NO", the following Medicare IM given date fields will be blank) Date Medicare IM given:  08/13/2012 Date Additional Medicare IM given:  08/16/2012  Discharge Disposition:  HOME/SELF CARE  Per UR Regulation:    If discussed at Long Length of Stay Meetings, dates discussed:    Comments:  08/16/12 0940 Arlyss Queen, RN BSN CM pt discharged home today. Pt still denies any need for HH. No DME needs noted.  08/11/12 1100 Arlyss Queen, RN BSN CM

## 2012-08-11 NOTE — Evaluation (Signed)
Physical Therapy Evaluation Patient Details Name: Tiffany Hartman MRN: [id number] DOB: 1927/07/14 Today's Date: 08/11/2012 Time: 7829-5621 PT Time Calculation (min): 32 min  PT Assessment / Plan / Recommendation History of Present Illness  Pt with a hx of Parkinsons, CVA with no residual deficit, right femorqal fx and bilateral TKR now admitted with L sided weakness.  She lives with her daughter and is normally independent at home.  Clinical Impression   Pt was seen for evaluation.  She reports that she is feeling increased left sided strength since yesterday but feels weak overall.  She has been on self imposed bedrest since last week when symptoms began and this has caused generalized deconditioning.  Her LLE strength equals RLE strength and balance is WNL.  Her gait is much more guarded with a quad cane, but this is most likely due to deconditioning.  I would recommend HHPT at d/c.  She has excellent family support and will not be alone at d/c.    PT Assessment  Patient needs continued PT services    Follow Up Recommendations  Home health PT    Does the patient have the potential to tolerate intense rehabilitation    no  Barriers to Discharge  no      Equipment Recommendations  None recommended by PT    Recommendations for Other Services     Frequency Min 3X/week    Precautions / Restrictions Precautions Precautions: Fall Restrictions Weight Bearing Restrictions: No   Pertinent Vitals/Pain       Mobility  Bed Mobility Bed Mobility: Supine to Sit Supine to Sit: HOB elevated;6: Modified independent (Device/Increase time) Transfers Transfers: Sit to Stand;Stand to Sit Sit to Stand: 5: Supervision;With upper extremity assist;From bed Stand to Sit: 5: Supervision;To chair/3-in-1 Ambulation/Gait Ambulation/Gait Assistance: 5: Supervision Ambulation Distance (Feet): 20 Feet Assistive device: Large base quad cane Gait Pattern: Within Functional Limits Gait velocity:  WNL Stairs: No Wheelchair Mobility Wheelchair Mobility: No    Exercises     PT Diagnosis: Difficulty walking;Generalized weakness  PT Problem List: Decreased strength;Decreased activity tolerance;Decreased mobility;Obesity PT Treatment Interventions: Gait training;Functional mobility training;Therapeutic exercise;Patient/family education     PT Goals(Current goals can be found in the care plan section) Acute Rehab PT Goals Patient Stated Goal: return home PT Goal Formulation: With patient Time For Goal Achievement: 08/25/12 Potential to Achieve Goals: Good  Visit Information  Last PT Received On: 08/11/12 Assistance Needed: +1 PT/OT Co-Evaluation/Treatment: Yes History of Present Illness: Pt with a hx of Parkinsons, CVA with no residual deficit, right femorqal fx and bilateral TKR now admitted with L sided weakness.  She lives with her daughter and is normally independent at home.       Prior Functioning  Home Living Family/patient expects to be discharged to:: Private residence Living Arrangements: Children Available Help at Discharge: Family;Available 24 hours/day Type of Home: House Home Access: Ramped entrance Home Layout: One level Home Equipment: Walker - 2 wheels;Cane - quad;Shower seat - built in;Wheelchair - manual Prior Function Level of Independence: Independent Comments: occasionally uses a quad cane for gait Communication Communication: Expressive difficulties (mild dysarthria) Dominant Hand: Right    Cognition  Cognition Arousal/Alertness: Awake/alert Behavior During Therapy: WFL for tasks assessed/performed Overall Cognitive Status: Within Functional Limits for tasks assessed    Extremity/Trunk Assessment Upper Extremity Assessment Upper Extremity Assessment: Defer to OT evaluation Lower Extremity Assessment Lower Extremity Assessment: Overall WFL for tasks assessed Cervical / Trunk Assessment Cervical / Trunk Assessment: Normal   Balance  Balance  Balance Assessed: Yes Static Standing Balance Static Standing - Balance Support: Right upper extremity supported Static Standing - Level of Assistance: 6: Modified independent (Device/Increase time) Dynamic Standing Balance Dynamic Standing - Balance Support: Right upper extremity supported Dynamic Standing - Level of Assistance: 5: Stand by assistance  End of Session PT - End of Session Equipment Utilized During Treatment: Gait belt Activity Tolerance: Patient limited by fatigue Patient left: in chair;with call bell/phone within reach;with chair alarm set  GP     Myrlene Broker L 08/11/2012, 9:06 AM

## 2012-08-11 NOTE — Consult Note (Signed)
[facility] Cristella Stiver A. Gerilyn Pilgrim, MD     [email or web address]          Tiffany Hartman is an 79 y.o. female.   ASSESSMENT/PLAN: 1. Acute left-sided numbness and weakness on the etiology. The most likely etiology is from an acute subcortical infarct. The patient has the typical stroke workup pending including an MRI of the brain.   3. Tremors thought to be due to parkinsonism.  The patient 79 year old black female who woke up on these past Thursday with the severe dizziness. She does have a baseline history of episodic but infrequent dizzy spells. She infected has had a series of MRIs done a few years ago for these symptoms. The MRI showed ischemic white matter changes chronically but nothing acute. The patient reports that the stomach around the vertiginous symptoms were associated with numbness of the left facial region, left upper extremity and left leg. She also now reports having some weakness. The patient did not seek medical attention until yesterday. She decides sig medical attention because the symptoms got worse. Initially were improving but on yesterday they apparently deteriorated. The patient was sent over to the hospital for further evaluation and likely admission. She is also noted to have tremors involving the jaw and significant dysarthria. She reports that she does have baseline tremors but this seems to be dramatically worse. She has been admitted overnight and has had some modest improvement especially in her tremor of the jaw. The review systems are positive for arthritic pain and difficulties ambulating.  GENERAL:  This is a pleasant obese lady in no acute distress.  HEENT: There is a large irregular shaped pupil on the right which is nonreactive. She tells me that she has had surgery. The left is 4 mm and reactive. Neck is supple. She does have a frequent but interrupted tremor involving the jaw moderate frequency moderate amplitude.  ABDOMEN: soft  EXTREMITIES: No  edema   BACK: Unremarkable.  SKIN: Normal by inspection.    MENTAL STATUS: Alert and oriented. She is lucid and coherent. She does have moderate dysarthria associated with tremoring of withdrawal.  CRANIAL NERVES: The extra ocular movements are full, there is no significant nystagmus; visual fields are full; upper and lower facial muscles are normal in strength and symmetric, there is no flattening of the nasolabial folds; tongue is midline; uvula is midline; shoulder elevation is normal.  MOTOR: Normal tone, bulk and strength; no pronator drift.  COORDINATION: Left finger to nose is normal, right finger to nose is normal, No rest tremor; no intention tremor; no postural tremor; no bradykinesia.  REFLEXES: Deep tendon reflexes are symmetrical and normal. Plantar responses are flexor bilaterally.   SENSATION: Diminished to temperature and light touch involving the left facial region, left upper extremity and left leg.    Past Medical History  Diagnosis Date  . Dementia   . Bronchitis   . Asthma   . Diabetes mellitus   . Parkinson disease   . HTN (hypertension)   . OA (osteoarthritis)   . Thyroid nodule     Dr [name]  . Glaucoma   . Diverticulitis 2009  . Breast cancer 02/20/83    LT mastectomy    Past Surgical History  Procedure Laterality Date  . Abdominal hysterectomy      complete  . Joint replacement  1998/1999    knee boths  . Mastectomy  02/20/1983    left   . Fracture surgery  2008     right and  left femurs  . Appendectomy    .  bilateral catracts    . Eus  09/10/2011    Procedure: UPPER ENDOSCOPIC ULTRASOUND (EUS) RADIAL;  Surgeon: Willis Modena, MD;  Location: [facility];  Service: Endoscopy;  Laterality: N/A;  christina/ebp  . Fine needle aspiration  09/10/2011    Procedure: FINE NEEDLE ASPIRATION (FNA) LINEAR;  Surgeon: Willis Modena, MD;  Location: [facility];  Service: Endoscopy;  Laterality: N/A;  . Knee surgery Right     TKA  . Knee surgery Left      TKA  . Knee surgery Right     Periprosthetic fracture Donata Clay    Family History  Problem Relation Age of Onset  . Colon polyps Daughter 58  . Stroke Father   . Huntington's disease Mother   . Throat cancer Son     Social History:  reports that she has never smoked. She does not have any smokeless tobacco history on file. She reports that she does not drink alcohol or use illicit drugs.  Allergies: No Known Allergies  Medications:  Prior to Admission medications   Medication Sig Start Date End Date Taking? Authorizing Provider  albuterol (PROVENTIL HFA;VENTOLIN HFA) 108 (90 BASE) MCG/ACT inhaler Inhale 1-2 puffs into the lungs every 6 (six) hours as needed for wheezing. 07/16/11 07/15/12  Glynn Octave, MD  ALPRAZolam Prudy Feeler) 0.25 MG tablet Take 0.25 mg by mouth 2 (two) times daily.    Historical Provider, MD  amLODipine (NORVASC) 5 MG tablet Take 5 mg by mouth daily.    Historical Provider, MD  aspirin EC 81 MG tablet Take 81 mg by mouth daily.    Historical Provider, MD  Calcium Carbonate-Vitamin D (CALCIUM + D) 600-200 MG-UNIT TABS Take 1 tablet by mouth every evening.     Historical Provider, MD  donepezil (ARICEPT) 5 MG tablet Take 5 mg by mouth daily.    Historical Provider, MD  ferrous sulfate 325 (65 FE) MG tablet Take 325 mg by mouth daily.    Historical Provider, MD  FLUoxetine (PROZAC) 20 MG capsule Take 20 mg by mouth daily.    Historical Provider, MD  folic acid (FOLVITE) 1 MG tablet Take 1 mg by mouth daily.    Historical Provider, MD  furosemide (LASIX) 20 MG tablet Take 20 mg by mouth daily.    Historical Provider, MD  gabapentin (NEURONTIN) 300 MG capsule Take 1 capsule (300 mg total) by mouth 3 (three) times daily. 06/02/12   Vickki Hearing, MD  HYDROcodone-acetaminophen (NORCO/VICODIN) 5-325 MG per tablet Take 1 tablet by mouth every 4 (four) hours as needed for pain. 06/02/12   Vickki Hearing, MD  ibuprofen (ADVIL,MOTRIN) 800 MG tablet Take 1 tablet (800 mg  total) by mouth every 8 (eight) hours as needed for pain. 06/02/12   Vickki Hearing, MD  levETIRAcetam (KEPPRA) 250 MG tablet Take 250 mg by mouth 2 (two) times daily.    Historical Provider, MD  lovastatin (MEVACOR) 10 MG tablet Take 10 mg by mouth at bedtime.    Historical Provider, MD  meclizine (ANTIVERT) 25 MG tablet Take 25 mg by mouth every 6 (six) hours as needed. dizziness    Historical Provider, MD  methylPREDNISolone (MEDROL, PAK,) 4 MG tablet follow package directions 06/02/12   Vickki Hearing, MD  olmesartan (BENICAR) 40 MG tablet Take 40 mg by mouth daily.    Historical Provider, MD  omeprazole (PRILOSEC) 20 MG capsule Take 20 mg by mouth daily.  Historical Provider, MD  potassium chloride SA (K-DUR,KLOR-CON) 20 MEQ tablet Take 0.5 tablets (10 mEq total) by mouth daily. 04/30/11 04/29/12  Avon Gully, MD    Scheduled Meds: . ALPRAZolam  0.25 mg Oral BID  . amLODipine  5 mg Oral Daily  . clopidogrel  75 mg Oral Q breakfast  . donepezil  5 mg Oral Daily  . ferrous sulfate  325 mg Oral Daily  . FLUoxetine  20 mg Oral Daily  . folic acid  1 mg Oral Daily  . furosemide  20 mg Oral Daily  . gabapentin  300 mg Oral TID  . heparin  5,000 Units Subcutaneous Q8H  . irbesartan  300 mg Oral Daily  . levETIRAcetam  250 mg Oral BID  . pantoprazole  40 mg Oral Daily  . simvastatin  5 mg Oral q1800   Continuous Infusions: . sodium chloride     PRN Meds:.acetaminophen, acetaminophen, albuterol, HYDROcodone-acetaminophen, meclizine, senna-docusate   Blood pressure 133/72, pulse 73, temperature 98.8 F (37.1 C), temperature source Axillary, resp. rate 20, weight 104.5 kg (230 lb 6.1 oz), SpO2 92.00%.   Results for orders placed during the hospital encounter of 08/10/12 (from the past 48 hour(s))  CBC     Status: Abnormal   Collection Time    08/10/12  6:38 PM      Result Value Range   WBC 7.8  4.0 - 10.5 K/uL   RBC 4.08  3.87 - 5.11 MIL/uL   Hemoglobin 11.9 (*) 12.0 -  15.0 g/dL   HCT 16.1  09.6 - 04.5 %   MCV 90.7  78.0 - 100.0 fL   MCH 29.2  26.0 - 34.0 pg   MCHC 32.2  30.0 - 36.0 g/dL   RDW 40.9  81.1 - 91.4 %   Platelets 319  150 - 400 K/uL  COMPREHENSIVE METABOLIC PANEL     Status: Abnormal   Collection Time    08/10/12  6:38 PM      Result Value Range   Sodium 140  135 - 145 mEq/L   Potassium 4.0  3.5 - 5.1 mEq/L   Chloride 103  96 - 112 mEq/L   CO2 28  19 - 32 mEq/L   Glucose, Bld 93  70 - 99 mg/dL   BUN 12  6 - 23 mg/dL   Creatinine, Ser 7.82  0.50 - 1.10 mg/dL   Calcium 9.0  8.4 - 95.6 mg/dL   Total Protein 7.0  6.0 - 8.3 g/dL   Albumin 3.6  3.5 - 5.2 g/dL   AST 13  0 - 37 U/L   ALT 10  0 - 35 U/L   Alkaline Phosphatase 65  39 - 117 U/L   Total Bilirubin 0.3  0.3 - 1.2 mg/dL   GFR calc non Af Amer 48 (*) >90 mL/min   GFR calc Af Amer 56 (*) >90 mL/min   Comment:            The eGFR has been calculated     using the CKD EPI equation.     This calculation has not been     validated in all clinical     situations.     eGFR's persistently     <90 mL/min signify     possible Chronic Kidney Disease.  LIPID PANEL     Status: None   Collection Time    08/11/12  5:19 AM      Result Value Range   Cholesterol 163  0 - 200 mg/dL   Triglycerides 086  <578 mg/dL   HDL 45  >46 mg/dL   Total CHOL/HDL Ratio 3.6     VLDL 27  0 - 40 mg/dL   LDL Cholesterol 91  0 - 99 mg/dL   Comment:            Total Cholesterol/HDL:CHD Risk     Coronary Heart Disease Risk Table                         Men   Women      1/2 Average Risk   3.4   3.3      Average Risk       5.0   4.4      2 X Average Risk   9.6   7.1      3 X Average Risk  23.4   11.0                Use the calculated Patient Ratio     above and the CHD Risk Table     to determine the patient's CHD Risk.                ATP III CLASSIFICATION (LDL):      <100     mg/dL   Optimal      962-952  mg/dL   Near or Above                        Optimal      130-159  mg/dL   Borderline       841-324  mg/dL   High      >401     mg/dL   Very High  CBC     Status: Abnormal   Collection Time    08/11/12  5:19 AM      Result Value Range   WBC 7.9  4.0 - 10.5 K/uL   RBC 4.10  3.87 - 5.11 MIL/uL   Hemoglobin 11.6 (*) 12.0 - 15.0 g/dL   HCT 02.7  25.3 - 66.4 %   MCV 91.7  78.0 - 100.0 fL   MCH 28.3  26.0 - 34.0 pg   MCHC 30.9  30.0 - 36.0 g/dL   RDW 40.3  47.4 - 25.9 %   Platelets 333  150 - 400 K/uL  BASIC METABOLIC PANEL     Status: Abnormal   Collection Time    08/11/12  5:19 AM      Result Value Range   Sodium 140  135 - 145 mEq/L   Potassium 3.8  3.5 - 5.1 mEq/L   Chloride 105  96 - 112 mEq/L   CO2 26  19 - 32 mEq/L   Glucose, Bld 106 (*) 70 - 99 mg/dL   BUN 10  6 - 23 mg/dL   Creatinine, Ser 5.63  0.50 - 1.10 mg/dL   Calcium 8.7  8.4 - 87.5 mg/dL   GFR calc non Af Amer 51 (*) >90 mL/min   GFR calc Af Amer 59 (*) >90 mL/min   Comment:            The eGFR has been calculated     using the CKD EPI equation.     This calculation has not been     validated in all clinical     situations.     eGFR's persistently     <90  mL/min signify     possible Chronic Kidney Disease.    Dg Chest 2 View  08/10/2012   *RADIOLOGY REPORT*  Clinical Data: History of breast cancer  CHEST - 2 VIEW  Comparison: July 16, 2011  Findings: There is no focal infiltrate, pulmonary edema, or pleural effusion.  Mediastinal contour and cardiac silhouette are stable. The soft tissues and osseous structures are stable.  IMPRESSION: No acute cardiopulmonary disease identified.   Original Report Authenticated By: Sherian Rein, M.D.   Ct Head Wo Contrast  08/10/2012   *RADIOLOGY REPORT*  Clinical Data: Stroke.  Slurred speech.  CT HEAD WITHOUT CONTRAST  Technique:  Contiguous axial images were obtained from the base of the skull through the vertex without contrast.  Comparison: CT scan dated 09/22/2009  Findings: There is no acute intracranial hemorrhage, infarction, or mass lesion.  There are multiple  old small lacunar infarcts including the most prominent area in the right centrum semiovale, unchanged.  No ventricular dilatation.  No acute osseous abnormality.  IMPRESSION: No acute abnormalities.  Multiple old infarcts.   Original Report Authenticated By: Francene Boyers, M.D.        Zaide Kardell A. Gerilyn Pilgrim, M.D.  Diplomate, Biomedical engineer of Psychiatry and Neurology ( Neurology). 08/11/2012, 7:49 AM

## 2012-08-11 NOTE — Evaluation (Signed)
Occupational Therapy Evaluation Patient Details Name: Tiffany Hartman MRN: [id number] DOB: 06-05-1927 Today's Date: 08/11/2012 Time: 7829-5621 OT Time Calculation (min): 32 min  OT Assessment / Plan / Recommendation History of present illness Pt with a hx of Parkinsons, CVA with no residual deficit, right femorqal fx and bilateral TKR now admitted with L sided weakness.  She lives with her daughter and is normally independent at home.   Clinical Impression   Patient is a 79 y/o female s/p left side weakness presenting to acute OT with deficits below. Patient will benefit from OT services to increase ADL performance, functional transfers, and LUE strength and endurance. Recommend home health OT at d/c.    OT Assessment  Patient needs continued OT Services    Follow Up Recommendations  Home health OT       Equipment Recommendations  None recommended by OT       Frequency  Min 2X/week    Precautions / Restrictions Precautions Precautions: Fall Restrictions Weight Bearing Restrictions: No   Pertinent Vitals/Pain No complaints.    ADL  Eating/Feeding: Performed;Set up Where Assessed - Eating/Feeding: Chair Toilet Transfer: Performed;Min guard Toilet Transfer Method: Surveyor, minerals:  (to recliner)    OT Diagnosis: Generalized weakness  OT Problem List: Decreased strength;Decreased coordination OT Treatment Interventions: Self-care/ADL training;Therapeutic activities;Therapeutic exercise;Neuromuscular education;Manual therapy;DME and/or AE instruction;Modalities;Balance training;Patient/family education   OT Goals(Current goals can be found in the care plan section) Acute Rehab OT Goals Patient Stated Goal: return home  Visit Information  Last OT Received On: 08/11/12 Assistance Needed: +1 History of Present Illness: Pt with a hx of Parkinsons, CVA with no residual deficit, right femorqal fx and bilateral TKR now admitted with L sided weakness.  She lives  with her daughter and is normally independent at home.       Prior Functioning     Home Living Family/patient expects to be discharged to:: Private residence Living Arrangements: Children Available Help at Discharge: Family;Available 24 hours/day Type of Home: House Home Access: Ramped entrance Home Layout: One level Home Equipment: Walker - 2 wheels;Cane - quad;Shower seat - built in;Wheelchair - manual Prior Function Level of Independence: Independent Comments: occasionally uses a quad cane for gait Communication Communication: Expressive difficulties (mild dysarthria) Dominant Hand: Right            Cognition  Cognition Arousal/Alertness: Awake/alert Behavior During Therapy: WFL for tasks assessed/performed Overall Cognitive Status: Within Functional Limits for tasks assessed    Extremity/Trunk Assessment Upper Extremity Assessment Upper Extremity Assessment: LUE deficits/detail LUE Deficits / Details: MMT: 3-/5 shoulder flexion due to rotator cuff injury, 3+/5 elbow flexion and extension. decreased gross grasp Lower Extremity Assessment Lower Extremity Assessment: Overall WFL for tasks assessed Cervical / Trunk Assessment Cervical / Trunk Assessment: Normal     Mobility Bed Mobility Bed Mobility: Supine to Sit Supine to Sit: HOB elevated;6: Modified independent (Device/Increase time) Transfers Sit to Stand: 5: Supervision;With upper extremity assist;From bed Stand to Sit: 5: Supervision;To chair/3-in-1        Balance Balance Balance Assessed: Yes Static Standing Balance Static Standing - Balance Support: Right upper extremity supported Static Standing - Level of Assistance: 6: Modified independent (Device/Increase time) Dynamic Standing Balance Dynamic Standing - Balance Support: Right upper extremity supported Dynamic Standing - Level of Assistance: 5: Stand by assistance   End of Session OT - End of Session Equipment Utilized During Treatment: Gait  belt Activity Tolerance: Patient tolerated treatment well Patient left: in chair;with call bell/phone  within reach;with chair alarm set    Limmie Patricia, OTR/L,CBIS   08/11/2012, 12:00 PM

## 2012-08-11 NOTE — Progress Notes (Signed)
*  PRELIMINARY RESULTS* Echocardiogram 2D Echocardiogram has been performed.  Tiffany Hartman 08/11/2012, 10:43 AM

## 2012-08-12 DIAGNOSIS — G459 Transient cerebral ischemic attack, unspecified: Secondary | ICD-10-CM | POA: Diagnosis not present

## 2012-08-12 DIAGNOSIS — E119 Type 2 diabetes mellitus without complications: Secondary | ICD-10-CM | POA: Diagnosis not present

## 2012-08-12 DIAGNOSIS — I10 Essential (primary) hypertension: Secondary | ICD-10-CM | POA: Diagnosis not present

## 2012-08-12 DIAGNOSIS — J410 Simple chronic bronchitis: Secondary | ICD-10-CM | POA: Diagnosis not present

## 2012-08-12 DIAGNOSIS — R569 Unspecified convulsions: Secondary | ICD-10-CM | POA: Diagnosis not present

## 2012-08-12 DIAGNOSIS — I6340 Cerebral infarction due to embolism of unspecified cerebral artery: Secondary | ICD-10-CM | POA: Diagnosis not present

## 2012-08-12 MED ORDER — IPRATROPIUM BROMIDE 0.02 % IN SOLN
0.5000 mg | Freq: Three times a day (TID) | RESPIRATORY_TRACT | Status: DC
  Administered 2012-08-12 – 2012-08-16 (×13): 0.5 mg via RESPIRATORY_TRACT
  Filled 2012-08-12 (×13): qty 2.5

## 2012-08-12 MED ORDER — METHYLPREDNISOLONE SODIUM SUCC 125 MG IJ SOLR
125.0000 mg | Freq: Once | INTRAMUSCULAR | Status: AC
  Administered 2012-08-12: 125 mg via INTRAVENOUS
  Filled 2012-08-12: qty 2

## 2012-08-12 MED ORDER — ALBUTEROL SULFATE (5 MG/ML) 0.5% IN NEBU
2.5000 mg | INHALATION_SOLUTION | Freq: Three times a day (TID) | RESPIRATORY_TRACT | Status: DC
  Administered 2012-08-12 – 2012-08-16 (×13): 2.5 mg via RESPIRATORY_TRACT
  Filled 2012-08-12 (×13): qty 0.5

## 2012-08-12 NOTE — Evaluation (Signed)
Clinical/Bedside Swallow Evaluation  Patient Details  Name: Tiffany Hartman MRN: [id number] Date of Birth: 1927-06-07  Today's Date: 08/12/2012 Time: 1550-1620 SLP Time Calculation (min): 30 min  Past Medical History:  Past Medical History  Diagnosis Date  . Dementia   . Bronchitis   . Asthma   . Diabetes mellitus   . Parkinson disease   . HTN (hypertension)   . OA (osteoarthritis)   . Thyroid nodule     Dr [name]  . Glaucoma   . Diverticulitis 2009  . Breast cancer 02/20/83    LT mastectomy   Past Surgical History:  Past Surgical History  Procedure Laterality Date  . Abdominal hysterectomy      complete  . Joint replacement  1998/1999    knee boths  . Mastectomy  02/20/1983    left   . Fracture surgery  2008     right and left femurs  . Appendectomy    .  bilateral catracts    . Eus  09/10/2011    Procedure: UPPER ENDOSCOPIC ULTRASOUND (EUS) RADIAL;  Surgeon: Willis Modena, MD;  Location: [facility];  Service: Endoscopy;  Laterality: N/A;  christina/ebp  . Fine needle aspiration  09/10/2011    Procedure: FINE NEEDLE ASPIRATION (FNA) LINEAR;  Surgeon: Willis Modena, MD;  Location: [facility];  Service: Endoscopy;  Laterality: N/A;  . Knee surgery Right     TKA  . Knee surgery Left     TKA  . Knee surgery Right     Periprosthetic fracture Donata Clay   HPI:  Tiffany Hartman is a 79 y.o. female with a prior history of stroke approximately 7-8 years ago with no residual effects. Patient was in her usual state of health on Thursday she developed left-sided numbness in her face, upper and lower extremities. This is associated with left-sided weakness. She started to develop some dysarthria. She also had significant problems with ataxia. Her symptoms began to improve the following day and therefore she did not seek medical attention. Tuesday her symptoms recurred and were progressively getting worse. She had gone to see a neurologist for evaluation. She denies any chest pain, fever,  dysuria, diarrhea. She does have nausea but denies vomiting. She denies any double vision/blurred vision. She does describe some dysphagia and felt like she was choking when she was eating and drinking. She described significant gait disturbance is due to her dizziness/ataxia. She has had chronic cough and describes a chronic bronchitis. She has been using nebulizer treatments at home. She was evaluated at her neurologist's office on Tuesday and was then admitted to [facility]. MRI is negative for acute stroke.   Assessment / Plan / Recommendation Clinical Impression  Pt has tremor, swallow function appears WFL at this time. Pt reports that she has improved/resolved since Tuesday. No changes in cognitive linguistic function noted- pt functioning at baseline. No further SLP services indicated at this time. Pt and family in agreement.    Aspiration Risk  Mild    Diet Recommendation Regular;Thin liquid   Liquid Administration via: Cup;Straw Medication Administration: Whole meds with liquid Supervision: Patient able to self feed Postural Changes and/or Swallow Maneuvers: Seated upright 90 degrees;Upright 30-60 min after meal    Other  Recommendations Oral Care Recommendations: Oral care BID Other Recommendations: Clarify dietary restrictions   Follow Up Recommendations  None           Swallow Study Prior Functional Status  Cognitive/Linguistic Baseline: Baseline deficits Baseline deficit details: Dementia is listed in  her history, however she appears very sharp. Type of Home: House  Lives With: Daughter Available Help at Discharge: Family Vocation: Retired    General Date of Onset: 08/10/12 HPI: Tiffany Hartman is a 79 y.o. female with a prior history of stroke approximately 7-8 years ago with no residual effects. Patient was in her usual state of health on Thursday she developed left-sided numbness in her face, upper and lower extremities. This is associated with left-sided weakness. She started  to develop some dysarthria. She also had significant problems with ataxia. Her symptoms began to improve the following day and therefore she did not seek medical attention. Tuesday her symptoms recurred and were progressively getting worse. She had gone to see a neurologist for evaluation. She denies any chest pain, fever, dysuria, diarrhea. She does have nausea but denies vomiting. She denies any double vision/blurred vision. She does describe some dysphagia and felt like she was choking when she was eating and drinking. She described significant gait disturbance is due to her dizziness/ataxia. She has had chronic cough and describes a chronic bronchitis. She has been using nebulizer treatments at home. She was evaluated at her neurologist's office on Tuesday and was then admitted to [facility]. MRI is negative for acute stroke. Type of Study: Bedside swallow evaluation Diet Prior to this Study: Dysphagia 3 (soft);Thin liquids Temperature Spikes Noted: No Respiratory Status: Room air History of Recent Intubation: No Behavior/Cognition: Alert;Cooperative;Pleasant mood Oral Cavity - Dentition: Dentures, top;Dentures, bottom Self-Feeding Abilities: Able to feed self Patient Positioning: Upright in chair Baseline Vocal Quality: Clear Volitional Cough: Strong Volitional Swallow: Able to elicit    Oral/Motor/Sensory Function Overall Oral Motor/Sensory Function: Impaired Labial ROM: Reduced right Labial Symmetry: Abnormal symmetry right (from old stroke per pt) Labial Strength: Within Functional Limits Labial Sensation: Within Functional Limits Lingual ROM: Within Functional Limits (reduced coordination) Lingual Symmetry: Within Functional Limits Lingual Strength: Within Functional Limits Lingual Sensation: Within Functional Limits Facial ROM: Within Functional Limits Facial Symmetry: Within Functional Limits Facial Strength: Within Functional Limits Facial Sensation: Within Functional Limits Velum:  Within Functional Limits Mandible: Within Functional Limits   Ice Chips Ice chips: Within functional limits Presentation: Self Fed;Spoon   Thin Liquid Thin Liquid: Within functional limits Presentation: Cup;Self Fed;Straw    Nectar Thick Nectar Thick Liquid: Not tested   Honey Thick Honey Thick Liquid: Not tested   Puree Puree: Within functional limits Presentation: Spoon;Self Fed   Solid       Solid: Within functional limits Presentation: Self Fed      Thank you,  Havery Moros, CCC-SLP [phone]  Shakai Dolley 08/12/2012,4:42 PM

## 2012-08-12 NOTE — Progress Notes (Signed)
At 2200 pt was experiencing SOB and expiratory wheezing. RT was called and pt was given breathing tx. Pt was feeling better. After RT reevaluated pt, RT decided to order scheduled breathing treatments for pt. Pts breath sounds are much improved and isnt SOB. Will continue to monitor.

## 2012-08-12 NOTE — Progress Notes (Signed)
Patient ID: Tiffany Hartman, female   DOB: [birth year], 79 y.o.   MRN: [id number]  [facility] Lissy Deuser A. Gerilyn Pilgrim, MD     [email or web address]          Tiffany Hartman is an 79 y.o. female.   Assessment/Plan: Focal neurological deficits involving the left side of unclear etiology. The patient reports that she is feeling a lot better. She does have a history of tremors which could explain some of her symptoms including the dysarthria which has improved. An EEG is obtained as the workup has been mostly negative. We may consider adjustment her medications as is increase the Keppra and discontinuing the Neurontin which can cause dizziness and gait instability. The patient reports that she is feeling better.  She is awake alert and lucid. Both pupil irregular-shaped told the right is about 5 mm and reactive although somewhat sluggish. The left is 3 mm also irregular shaped and nonreactive. There is a mild right ptosis. The patient is noted to have a tremoring-like dysarthria. She is continuing to have tremors of the jaw. It is also bilateral moderate amplitude moderate frequency tremors involving the hands. She has good strength in the legs and the right upper extremity. The left upper extremity is weak due to left shoulder pain.    Objective: Vital signs in last 24 hours: Temp:  [98.1 F (36.7 C)-98.7 F (37.1 C)] 98.6 F (37 C) (08/07 1400) Pulse Rate:  [66-78] 66 (08/07 1400) Resp:  [20] 20 (08/07 1400) BP: (100-143)/(66-70) 110/70 mmHg (08/07 1400) SpO2:  [86 %-99 %] 96 % (08/07 1400)  Intake/Output from previous day:   Intake/Output this shift:   Nutritional status: Dysphagia   Lab Results: Results for orders placed during the hospital encounter of 08/10/12 (from the past 48 hour(s))  CBC     Status: Abnormal   Collection Time    08/10/12  6:38 PM      Result Value Range   WBC 7.8  4.0 - 10.5 K/uL   RBC 4.08  3.87 - 5.11 MIL/uL   Hemoglobin 11.9 (*) 12.0 - 15.0 g/dL   HCT  40.9  81.1 - 91.4 %   MCV 90.7  78.0 - 100.0 fL   MCH 29.2  26.0 - 34.0 pg   MCHC 32.2  30.0 - 36.0 g/dL   RDW 78.2  95.6 - 21.3 %   Platelets 319  150 - 400 K/uL  COMPREHENSIVE METABOLIC PANEL     Status: Abnormal   Collection Time    08/10/12  6:38 PM      Result Value Range   Sodium 140  135 - 145 mEq/L   Potassium 4.0  3.5 - 5.1 mEq/L   Chloride 103  96 - 112 mEq/L   CO2 28  19 - 32 mEq/L   Glucose, Bld 93  70 - 99 mg/dL   BUN 12  6 - 23 mg/dL   Creatinine, Ser 0.86  0.50 - 1.10 mg/dL   Calcium 9.0  8.4 - 57.8 mg/dL   Total Protein 7.0  6.0 - 8.3 g/dL   Albumin 3.6  3.5 - 5.2 g/dL   AST 13  0 - 37 U/L   ALT 10  0 - 35 U/L   Alkaline Phosphatase 65  39 - 117 U/L   Total Bilirubin 0.3  0.3 - 1.2 mg/dL   GFR calc non Af Amer 48 (*) >90 mL/min   GFR calc Af Amer 56 (*) >90 mL/min   Comment:  The eGFR has been calculated     using the CKD EPI equation.     This calculation has not been     validated in all clinical     situations.     eGFR's persistently     <90 mL/min signify     possible Chronic Kidney Disease.  HEMOGLOBIN A1C     Status: Abnormal   Collection Time    08/11/12  5:19 AM      Result Value Range   Hemoglobin A1C 6.5 (*) <5.7 %   Comment: (NOTE)                                                                               According to the ADA Clinical Practice Recommendations for 2011, when     HbA1c is used as a screening test:      >=6.5%   Diagnostic of Diabetes Mellitus               (if abnormal result is confirmed)     5.7-6.4%   Increased risk of developing Diabetes Mellitus     References:Diagnosis and Classification of Diabetes Mellitus,Diabetes     Care,2011,34(Suppl 1):S62-S69 and Standards of Medical Care in             Diabetes - 2011,Diabetes Care,2011,34 (Suppl 1):S11-S61.   Mean Plasma Glucose 140 (*) <117 mg/dL   Comment: Performed at [facility]  LIPID PANEL     Status: None   Collection Time    08/11/12  5:19 AM       Result Value Range   Cholesterol 163  0 - 200 mg/dL   Triglycerides 664  <403 mg/dL   HDL 45  >47 mg/dL   Total CHOL/HDL Ratio 3.6     VLDL 27  0 - 40 mg/dL   LDL Cholesterol 91  0 - 99 mg/dL   Comment:            Total Cholesterol/HDL:CHD Risk     Coronary Heart Disease Risk Table                         Men   Women      1/2 Average Risk   3.4   3.3      Average Risk       5.0   4.4      2 X Average Risk   9.6   7.1      3 X Average Risk  23.4   11.0                Use the calculated Patient Ratio     above and the CHD Risk Table     to determine the patient's CHD Risk.                ATP III CLASSIFICATION (LDL):      <100     mg/dL   Optimal      425-956  mg/dL   Near or Above                        Optimal  130-159  mg/dL   Borderline      811-914  mg/dL   High      >782     mg/dL   Very High  CBC     Status: Abnormal   Collection Time    08/11/12  5:19 AM      Result Value Range   WBC 7.9  4.0 - 10.5 K/uL   RBC 4.10  3.87 - 5.11 MIL/uL   Hemoglobin 11.6 (*) 12.0 - 15.0 g/dL   HCT 95.6  21.3 - 08.6 %   MCV 91.7  78.0 - 100.0 fL   MCH 28.3  26.0 - 34.0 pg   MCHC 30.9  30.0 - 36.0 g/dL   RDW 57.8  46.9 - 62.9 %   Platelets 333  150 - 400 K/uL  BASIC METABOLIC PANEL     Status: Abnormal   Collection Time    08/11/12  5:19 AM      Result Value Range   Sodium 140  135 - 145 mEq/L   Potassium 3.8  3.5 - 5.1 mEq/L   Chloride 105  96 - 112 mEq/L   CO2 26  19 - 32 mEq/L   Glucose, Bld 106 (*) 70 - 99 mg/dL   BUN 10  6 - 23 mg/dL   Creatinine, Ser 5.28  0.50 - 1.10 mg/dL   Calcium 8.7  8.4 - 41.3 mg/dL   GFR calc non Af Amer 51 (*) >90 mL/min   GFR calc Af Amer 59 (*) >90 mL/min   Comment:            The eGFR has been calculated     using the CKD EPI equation.     This calculation has not been     validated in all clinical     situations.     eGFR's persistently     <90 mL/min signify     possible Chronic Kidney Disease.  GLUCOSE, CAPILLARY      Status: Abnormal   Collection Time    08/11/12 11:26 PM      Result Value Range   Glucose-Capillary 120 (*) 70 - 99 mg/dL    Lipid Panel  Recent Labs  08/11/12 0519  CHOL 163  TRIG 135  HDL 45  CHOLHDL 3.6  VLDL 27  LDLCALC 91    Studies/Results: Dg Chest 2 View  08/10/2012   *RADIOLOGY REPORT*  Clinical Data: History of breast cancer  CHEST - 2 VIEW  Comparison: July 16, 2011  Findings: There is no focal infiltrate, pulmonary edema, or pleural effusion.  Mediastinal contour and cardiac silhouette are stable. The soft tissues and osseous structures are stable.  IMPRESSION: No acute cardiopulmonary disease identified.   Original Report Authenticated By: Sherian Rein, M.D.   Ct Head Wo Contrast  08/10/2012   *RADIOLOGY REPORT*  Clinical Data: Stroke.  Slurred speech.  CT HEAD WITHOUT CONTRAST  Technique:  Contiguous axial images were obtained from the base of the skull through the vertex without contrast.  Comparison: CT scan dated 09/22/2009  Findings: There is no acute intracranial hemorrhage, infarction, or mass lesion.  There are multiple old small lacunar infarcts including the most prominent area in the right centrum semiovale, unchanged.  No ventricular dilatation.  No acute osseous abnormality.  IMPRESSION: No acute abnormalities.  Multiple old infarcts.   Original Report Authenticated By: Francene Boyers, M.D.   Mr [facility] Wo Contrast  08/11/2012   *RADIOLOGY REPORT*  Clinical Data:  Left-sided weakness and numbness.  History of breast cancer.  History of prior stroke.  MRI BRAIN WITHOUT CONTRAST MRA HEAD WITHOUT CONTRAST  Technique: Multiplanar, multiecho pulse sequences of the brain and surrounding structures were obtained according to standard protocol without intravenous contrast.  Angiographic images of the head were obtained using MRA technique without contrast.  Comparison: 08/10/2012 CT.  05/17/2009 and 09/24/2009 MR brain and 05/17/2009 MRA angiogram circle of Willis.  MRI  HEAD  Findings:  Motion degraded exam.  No acute infarct.  No intracranial hemorrhage.  Prominent small vessel disease type changes have progressed since the prior exam.  No intracranial mass lesion detected on this unenhanced exam.  No hydrocephalus.  Hyperostosis frontalis interna incidentally noted.  Major intracranial vascular structures are patent.  Cervical medullary junction, pituitary region, pineal region and orbital structures unremarkable.  Minimal mucosal thickening ethmoid sinus air cells appear  IMPRESSION: Motion degraded exam.  No acute infarct.  No intracranial hemorrhage.  Prominent small vessel disease type changes have progressed since the prior exam.  MRA HEAD  Findings: Ectatic vertical segment of the internal carotid artery bilaterally.  Moderate narrowing anterior cavernous segment right internal carotid artery.  Mild narrowing proximal M1 segment left middle cerebral artery.  Mild narrowing distal A1 segment anterior cerebral artery bilaterally.  Mild branch vessel irregularity anterior cerebral artery and middle cerebral artery bilaterally.  Ectatic vertebral arteries.  Left vertebral artery is dominant. Mild narrowing distal right vertebral artery.  Moderate narrowing irregularity of the left PICA with poorly delineated right PICA.  Mild narrowing irregularity of the basilar artery.  Tiny right AICA.  Non visualized left AICA.  No aneurysm or vascular malformation noted.  IMPRESSION: Intracranial atherosclerotic type changes as detailed above.   Original Report Authenticated By: Lacy Duverney, M.D.   Mr Brain Wo Contrast  08/11/2012   *RADIOLOGY REPORT*  Clinical Data:  Left-sided weakness and numbness.  History of breast cancer.  History of prior stroke.  MRI BRAIN WITHOUT CONTRAST MRA HEAD WITHOUT CONTRAST  Technique: Multiplanar, multiecho pulse sequences of the brain and surrounding structures were obtained according to standard protocol without intravenous contrast.  Angiographic  images of the head were obtained using MRA technique without contrast.  Comparison: 08/10/2012 CT.  05/17/2009 and 09/24/2009 MR brain and 05/17/2009 MRA angiogram circle of Willis.  MRI HEAD  Findings:  Motion degraded exam.  No acute infarct.  No intracranial hemorrhage.  Prominent small vessel disease type changes have progressed since the prior exam.  No intracranial mass lesion detected on this unenhanced exam.  No hydrocephalus.  Hyperostosis frontalis interna incidentally noted.  Major intracranial vascular structures are patent.  Cervical medullary junction, pituitary region, pineal region and orbital structures unremarkable.  Minimal mucosal thickening ethmoid sinus air cells appear  IMPRESSION: Motion degraded exam.  No acute infarct.  No intracranial hemorrhage.  Prominent small vessel disease type changes have progressed since the prior exam.  MRA HEAD  Findings: Ectatic vertical segment of the internal carotid artery bilaterally.  Moderate narrowing anterior cavernous segment right internal carotid artery.  Mild narrowing proximal M1 segment left middle cerebral artery.  Mild narrowing distal A1 segment anterior cerebral artery bilaterally.  Mild branch vessel irregularity anterior cerebral artery and middle cerebral artery bilaterally.  Ectatic vertebral arteries.  Left vertebral artery is dominant. Mild narrowing distal right vertebral artery.  Moderate narrowing irregularity of the left PICA with poorly delineated right PICA.  Mild narrowing irregularity of the basilar artery.  Tiny right AICA.  Non  visualized left AICA.  No aneurysm or vascular malformation noted.  IMPRESSION: Intracranial atherosclerotic type changes as detailed above.   Original Report Authenticated By: Lacy Duverney, M.D.   US Carotid Duplex Bilateral  08/11/2012   *RADIOLOGY REPORT*  Clinical Data: Left-sided numbness.  History of TIA, syncope and diabetes.  BILATERAL CAROTID DUPLEX ULTRASOUND  Technique: Wallace Cullens scale imaging,  color Doppler and duplex ultrasound were performed of bilateral carotid and vertebral arteries in the neck.  Comparison:  None.  Criteria:  Quantification of carotid stenosis is based on velocity parameters that correlate the residual internal carotid diameter with NASCET-based stenosis levels, using the diameter of the distal internal carotid lumen as the denominator for stenosis measurement.  The following velocity measurements were obtained:                   PEAK SYSTOLIC/END DIASTOLIC RIGHT ICA:                        121/27cm/sec CCA:                        82/12cm/sec SYSTOLIC ICA/CCA RATIO:     1.5 DIASTOLIC ICA/CCA RATIO:    2.3 ECA:                        131cm/sec  LEFT ICA:                        127/23cm/sec CCA:                        88/14cm/sec SYSTOLIC ICA/CCA RATIO:     1.4 DIASTOLIC ICA/CCA RATIO:    1.6 ECA:                        98cm/sec  Findings:  RIGHT CAROTID ARTERY: There is a relatively mild amount of mixed plaque at the right carotid bifurcation.  Estimated right ICA stenosis is less than 50%.  RIGHT VERTEBRAL ARTERY:  Antegrade flow with normal wave form.  LEFT CAROTID ARTERY: A moderate amount of predominately calcified plaque is present at the left carotid bifurcation at the level of the bulb and extending into the proximal ICA.  Proximal ICA velocity is mildly increased and the estimated left ICA stenosis is 50 - 69%.  It is likely closer to 50% based on gray scale imaging and velocity.  LEFT VERTEBRAL ARTERY:  Antegrade flow with normal wave form.  IMPRESSION: Plaque at both carotid bifurcations, left greater than right. Estimated right ICA stenosis is less than 50%.  Estimated left ICA stenosis is 50 - 69%.  Left ICA stenosis is likely closer to 50%.   Original Report Authenticated By: Irish Lack, M.D.    Medications:  Scheduled Meds: . albuterol  2.5 mg Nebulization TID  . ALPRAZolam  0.25 mg Oral BID  . amLODipine  5 mg Oral Daily  . clopidogrel  75 mg Oral Q  breakfast  . donepezil  5 mg Oral Daily  . ferrous sulfate  325 mg Oral Daily  . FLUoxetine  20 mg Oral Daily  . folic acid  1 mg Oral Daily  . furosemide  20 mg Oral Daily  . gabapentin  300 mg Oral TID  . heparin  5,000 Units Subcutaneous Q8H  . ipratropium  0.5 mg Nebulization TID  . irbesartan  300 mg Oral Daily  . levETIRAcetam  250 mg Oral BID  . pantoprazole  40 mg Oral Daily  . simvastatin  5 mg Oral q1800   Continuous Infusions:  PRN Meds:.acetaminophen, acetaminophen, albuterol, HYDROcodone-acetaminophen, meclizine, senna-docusate     LOS: 2 days   Faust Thorington A. Gerilyn Pilgrim, M.D.  Diplomate, Biomedical engineer of Psychiatry and Neurology ( Neurology).

## 2012-08-12 NOTE — Progress Notes (Signed)
Tiffany KitchenngSubjective:  Patient is complaining of cough and wheezing. She is started on nebulizer treatment. Her numbness and lt sided weakness has resolved. CT scan of the head and MRI of the head is negative for acute stroke. Her Carotid artery doppler showed 50-60 stenosis.  Objective: Vital signs in last 24 hours: Temp:  [98.1 F (36.7 C)-98.7 F (37.1 C)] 98.1 F (36.7 C) (08/07 0525) Pulse Rate:  [66-78] 66 (08/07 0525) Resp:  [20] 20 (08/07 0525) BP: (100-143)/(66-70) 100/66 mmHg (08/07 0525) SpO2:  [86 %-99 %] 94 % (08/07 0659) Weight:  [104.5 kg (230 lb 6.1 oz)] 104.5 kg (230 lb 6.1 oz) (08/06 1036) Weight change: 0 kg (0 lb) Last BM Date: 08/11/12  Intake/Output from previous day:    PHYSICAL EXAM General appearance: alert and no distress Resp: diminished breath sounds bilaterally and rhonchi bilaterally Cardio: S1, S2 normal GI: soft, non-tender; bowel sounds normal; no masses,  no organomegaly Extremities: extremities normal, atraumatic, no cyanosis or edema  Lab Results:    @labtest @ [facility] No results found for this basename: PHART, PCO2, PO2ART, TCO2, HCO3,  in the last 72 hours CULTURES No results found for this or any previous visit (from the past 240 hour(s)). Studies/Results: Dg Chest 2 View  08/10/2012   *RADIOLOGY REPORT*  Clinical Data: History of breast cancer  CHEST - 2 VIEW  Comparison: July 16, 2011  Findings: There is no focal infiltrate, pulmonary edema, or pleural effusion.  Mediastinal contour and cardiac silhouette are stable. The soft tissues and osseous structures are stable.  IMPRESSION: No acute cardiopulmonary disease identified.   Original Report Authenticated By: Sherian Rein, M.D.   Ct Head Wo Contrast  08/10/2012   *RADIOLOGY REPORT*  Clinical Data: Stroke.  Slurred speech.  CT HEAD WITHOUT CONTRAST  Technique:  Contiguous axial images were obtained from the base of the skull through the vertex without contrast.  Comparison: CT scan dated  09/22/2009  Findings: There is no acute intracranial hemorrhage, infarction, or mass lesion.  There are multiple old small lacunar infarcts including the most prominent area in the right centrum semiovale, unchanged.  No ventricular dilatation.  No acute osseous abnormality.  IMPRESSION: No acute abnormalities.  Multiple old infarcts.   Original Report Authenticated By: Francene Boyers, M.D.   Mr [facility] Wo Contrast  08/11/2012   *RADIOLOGY REPORT*  Clinical Data:  Left-sided weakness and numbness.  History of breast cancer.  History of prior stroke.  MRI BRAIN WITHOUT CONTRAST MRA HEAD WITHOUT CONTRAST  Technique: Multiplanar, multiecho pulse sequences of the brain and surrounding structures were obtained according to standard protocol without intravenous contrast.  Angiographic images of the head were obtained using MRA technique without contrast.  Comparison: 08/10/2012 CT.  05/17/2009 and 09/24/2009 MR brain and 05/17/2009 MRA angiogram circle of Willis.  MRI HEAD  Findings:  Motion degraded exam.  No acute infarct.  No intracranial hemorrhage.  Prominent small vessel disease type changes have progressed since the prior exam.  No intracranial mass lesion detected on this unenhanced exam.  No hydrocephalus.  Hyperostosis frontalis interna incidentally noted.  Major intracranial vascular structures are patent.  Cervical medullary junction, pituitary region, pineal region and orbital structures unremarkable.  Minimal mucosal thickening ethmoid sinus air cells appear  IMPRESSION: Motion degraded exam.  No acute infarct.  No intracranial hemorrhage.  Prominent small vessel disease type changes have progressed since the prior exam.  MRA HEAD  Findings: Ectatic vertical segment of the internal carotid artery bilaterally.  Moderate narrowing  anterior cavernous segment right internal carotid artery.  Mild narrowing proximal M1 segment left middle cerebral artery.  Mild narrowing distal A1 segment anterior cerebral  artery bilaterally.  Mild branch vessel irregularity anterior cerebral artery and middle cerebral artery bilaterally.  Ectatic vertebral arteries.  Left vertebral artery is dominant. Mild narrowing distal right vertebral artery.  Moderate narrowing irregularity of the left PICA with poorly delineated right PICA.  Mild narrowing irregularity of the basilar artery.  Tiny right AICA.  Non visualized left AICA.  No aneurysm or vascular malformation noted.  IMPRESSION: Intracranial atherosclerotic type changes as detailed above.   Original Report Authenticated By: Lacy Duverney, M.D.   Mr Brain Wo Contrast  08/11/2012   *RADIOLOGY REPORT*  Clinical Data:  Left-sided weakness and numbness.  History of breast cancer.  History of prior stroke.  MRI BRAIN WITHOUT CONTRAST MRA HEAD WITHOUT CONTRAST  Technique: Multiplanar, multiecho pulse sequences of the brain and surrounding structures were obtained according to standard protocol without intravenous contrast.  Angiographic images of the head were obtained using MRA technique without contrast.  Comparison: 08/10/2012 CT.  05/17/2009 and 09/24/2009 MR brain and 05/17/2009 MRA angiogram circle of Willis.  MRI HEAD  Findings:  Motion degraded exam.  No acute infarct.  No intracranial hemorrhage.  Prominent small vessel disease type changes have progressed since the prior exam.  No intracranial mass lesion detected on this unenhanced exam.  No hydrocephalus.  Hyperostosis frontalis interna incidentally noted.  Major intracranial vascular structures are patent.  Cervical medullary junction, pituitary region, pineal region and orbital structures unremarkable.  Minimal mucosal thickening ethmoid sinus air cells appear  IMPRESSION: Motion degraded exam.  No acute infarct.  No intracranial hemorrhage.  Prominent small vessel disease type changes have progressed since the prior exam.  MRA HEAD  Findings: Ectatic vertical segment of the internal carotid artery bilaterally.  Moderate  narrowing anterior cavernous segment right internal carotid artery.  Mild narrowing proximal M1 segment left middle cerebral artery.  Mild narrowing distal A1 segment anterior cerebral artery bilaterally.  Mild branch vessel irregularity anterior cerebral artery and middle cerebral artery bilaterally.  Ectatic vertebral arteries.  Left vertebral artery is dominant. Mild narrowing distal right vertebral artery.  Moderate narrowing irregularity of the left PICA with poorly delineated right PICA.  Mild narrowing irregularity of the basilar artery.  Tiny right AICA.  Non visualized left AICA.  No aneurysm or vascular malformation noted.  IMPRESSION: Intracranial atherosclerotic type changes as detailed above.   Original Report Authenticated By: Lacy Duverney, M.D.   US Carotid Duplex Bilateral  08/11/2012   *RADIOLOGY REPORT*  Clinical Data: Left-sided numbness.  History of TIA, syncope and diabetes.  BILATERAL CAROTID DUPLEX ULTRASOUND  Technique: Wallace Cullens scale imaging, color Doppler and duplex ultrasound were performed of bilateral carotid and vertebral arteries in the neck.  Comparison:  None.  Criteria:  Quantification of carotid stenosis is based on velocity parameters that correlate the residual internal carotid diameter with NASCET-based stenosis levels, using the diameter of the distal internal carotid lumen as the denominator for stenosis measurement.  The following velocity measurements were obtained:                   PEAK SYSTOLIC/END DIASTOLIC RIGHT ICA:                        121/27cm/sec CCA:  82/12cm/sec SYSTOLIC ICA/CCA RATIO:     1.5 DIASTOLIC ICA/CCA RATIO:    2.3 ECA:                        131cm/sec  LEFT ICA:                        127/23cm/sec CCA:                        88/14cm/sec SYSTOLIC ICA/CCA RATIO:     1.4 DIASTOLIC ICA/CCA RATIO:    1.6 ECA:                        98cm/sec  Findings:  RIGHT CAROTID ARTERY: There is a relatively mild amount of mixed plaque at the right  carotid bifurcation.  Estimated right ICA stenosis is less than 50%.  RIGHT VERTEBRAL ARTERY:  Antegrade flow with normal wave form.  LEFT CAROTID ARTERY: A moderate amount of predominately calcified plaque is present at the left carotid bifurcation at the level of the bulb and extending into the proximal ICA.  Proximal ICA velocity is mildly increased and the estimated left ICA stenosis is 50 - 69%.  It is likely closer to 50% based on gray scale imaging and velocity.  LEFT VERTEBRAL ARTERY:  Antegrade flow with normal wave form.  IMPRESSION: Plaque at both carotid bifurcations, left greater than right. Estimated right ICA stenosis is less than 50%.  Estimated left ICA stenosis is 50 - 69%.  Left ICA stenosis is likely closer to 50%.   Original Report Authenticated By: Irish Lack, M.D.    Medications: I have reviewed the patient's current medications.  Assesment:  Principal Problem:   Left sided numbness Active Problems:   HIGH BLOOD PRESSURE   History of stroke   Parkinsons disease   Left hemiparesis   Dysarthria   Seizure disorder   Other and unspecified hyperlipidemia bronchitis   Plan: Continue telemetry Neurology consult pending D/C Iv fluid Will give dose of solumedrol    LOS: 2 days   Taron Conrey 08/12/2012, 7:55 AM

## 2012-08-12 NOTE — Progress Notes (Signed)
Physical Therapy Treatment Patient Details Name: Tiffany Hartman MRN: [id number] DOB: 1927/06/29 Today's Date: 08/12/2012 Time: 5621-3086 PT Time Calculation (min): 25 min  PT Assessment / Plan / Recommendation  History of Present Illness     PT Comments   Pt with increased distance with gait training with SBA x 48' with QC.  Pt with gait mechanics WNL, min cueing required for posture and to increase stride length for more normalized gait mechanics and energy conservation.  Pt limited by fatigue at end of session and c/o increased pain to Lt LE following gait.  Pt encouraged to call nurse for pain control.  Pt left in chair with call bell within reach and chair alarm set.    Follow Up Recommendations        Does the patient have the potential to tolerate intense rehabilitation     Barriers to Discharge        Equipment Recommendations       Recommendations for Other Services    Frequency     Progress towards PT Goals Progress towards PT goals: Progressing toward goals  Plan      Precautions / Restrictions Precautions Precautions: Fall Restrictions Weight Bearing Restrictions: No   Pertinent Vitals/Pain Lt LE pain increased to 7/10 following gait.    Mobility  Bed Mobility Supine to Sit: 5: Supervision Transfers Transfers: Sit to Stand;Stand to Sit Sit to Stand: 5: Supervision;With upper extremity assist;From bed;From toilet Stand to Sit: 5: Supervision;To chair/3-in-1;To toilet Ambulation/Gait Ambulation/Gait Assistance: 5: Supervision Ambulation Distance (Feet): 48 Feet Assistive device: Large base quad cane Gait Pattern: Within Functional Limits Gait velocity: WNL Stairs: No Wheelchair Mobility Wheelchair Mobility: No    Exercises     PT Diagnosis:    PT Problem List:   PT Treatment Interventions:     PT Goals (current goals can now be found in the care plan section)    Visit Information  Last PT Received On: 08/12/12    Subjective Data   Pt reported she  has had a good stay at [facility] but there is no place like home.     Cognition  Cognition Arousal/Alertness: Awake/alert Behavior During Therapy: WFL for tasks assessed/performed Overall Cognitive Status: Within Functional Limits for tasks assessed    Balance     End of Session PT - End of Session Equipment Utilized During Treatment: Gait belt Activity Tolerance: Patient limited by fatigue Patient left: in chair;with call bell/phone within reach;with chair alarm set   GP     Juel Burrow 08/12/2012, 10:14 AM

## 2012-08-13 DIAGNOSIS — J410 Simple chronic bronchitis: Secondary | ICD-10-CM | POA: Diagnosis not present

## 2012-08-13 DIAGNOSIS — R569 Unspecified convulsions: Secondary | ICD-10-CM | POA: Diagnosis not present

## 2012-08-13 DIAGNOSIS — I10 Essential (primary) hypertension: Secondary | ICD-10-CM | POA: Diagnosis not present

## 2012-08-13 DIAGNOSIS — I6340 Cerebral infarction due to embolism of unspecified cerebral artery: Secondary | ICD-10-CM | POA: Diagnosis not present

## 2012-08-13 DIAGNOSIS — E119 Type 2 diabetes mellitus without complications: Secondary | ICD-10-CM | POA: Diagnosis not present

## 2012-08-13 DIAGNOSIS — G459 Transient cerebral ischemic attack, unspecified: Secondary | ICD-10-CM | POA: Diagnosis not present

## 2012-08-13 MED ORDER — LEVETIRACETAM 500 MG PO TABS
250.0000 mg | ORAL_TABLET | Freq: Every day | ORAL | Status: DC
  Administered 2012-08-14 – 2012-08-16 (×3): 250 mg via ORAL
  Filled 2012-08-13 (×3): qty 1

## 2012-08-13 MED ORDER — LEVETIRACETAM 500 MG PO TABS
500.0000 mg | ORAL_TABLET | Freq: Every day | ORAL | Status: DC
  Administered 2012-08-13 – 2012-08-15 (×3): 500 mg via ORAL
  Filled 2012-08-13 (×3): qty 1

## 2012-08-13 NOTE — Progress Notes (Signed)
Patient ID: Tiffany Hartman, female   DOB: 31-Dec-1927, 79 y.o.   MRN: [id number]  [facility] NEUROLOGY Melvenia Favela A. Gerilyn Pilgrim, MD     [email or web address]          Tiffany Hartman is an 79 y.o. female.   Assessment/Plan: 1. Focal left-sided numbness and weakness of unclear significance or etiology. Imaging has been negative. EEG has been ordered but the patient has not had this done. 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 May consider increasing dose of this medication. Apparently this would not be done to Monday. I do not believe that the patient has sustained the hospital to get this done. This certainly can be arranged in the outpatient setting. However, she does have some significant underlying pulmonary problems which is being addressed.  2. Symmetric tremors associated with dysarthria.  3. Dizziness. The patient's Neurontin will be discontinued.  The patient's is currently undergoing treatment for wheezing. She does sound to be mildly dyspneic and coughing quite a bit. Saturation 100% with the treatment. She continues to have symmetric tremors of the upper extremities and also jaw tremor. She is moving both sides well.     Objective: Vital signs in last 24 hours: Temp:  [97.7 F (36.5 C)-98.7 F (37.1 C)] 97.8 F (36.6 C) (08/08 1741) Pulse Rate:  [84-102] 102 (08/08 1741) Resp:  [19-21] 21 (08/08 1741) BP: (110-147)/(64-76) 147/75 mmHg (08/08 1741) SpO2:  [92 %-100 %] 92 % (08/08 1741)  Intake/Output from previous day: 08/07 0701 - 08/08 0700 In: 200 [P.O.:200] Out: -  Intake/Output this shift:   Nutritional status: Dysphagia   Lab Results: Results for orders placed during the hospital encounter of 08/10/12 (from the past 48 hour(s))  GLUCOSE, CAPILLARY     Status: Abnormal   Collection Time    08/11/12 11:26 PM      Result Value Range   Glucose-Capillary 120 (*) 70 - 99 mg/dL    Lipid  Panel  Recent Labs  08/11/12 0519  CHOL 163  TRIG 135  HDL 45  CHOLHDL 3.6  VLDL 27  LDLCALC 91    Studies/Results: No results found.  Medications:  Scheduled Meds: . albuterol  2.5 mg Nebulization TID  . ALPRAZolam  0.25 mg Oral BID  . amLODipine  5 mg Oral Daily  . clopidogrel  75 mg Oral Q breakfast  . donepezil  5 mg Oral Daily  . ferrous sulfate  325 mg Oral Daily  . FLUoxetine  20 mg Oral Daily  . folic acid  1 mg Oral Daily  . furosemide  20 mg Oral Daily  . gabapentin  300 mg Oral TID  . heparin  5,000 Units Subcutaneous Q8H  . ipratropium  0.5 mg Nebulization TID  . irbesartan  300 mg Oral Daily  . levETIRAcetam  250 mg Oral BID  . pantoprazole  40 mg Oral Daily  . simvastatin  5 mg Oral q1800   Continuous Infusions:  PRN Meds:.acetaminophen, acetaminophen, albuterol, HYDROcodone-acetaminophen, meclizine, senna-docusate     LOS: 3 days   Maui Britten A. Gerilyn Pilgrim, M.D.  Diplomate, Biomedical engineer of Psychiatry and Neurology ( Neurology).

## 2012-08-13 NOTE — Progress Notes (Signed)
Physical Therapy Treatment Patient Details Name: Tiffany Hartman MRN: [id number] DOB: March 22, 1927 Today's Date: 08/13/2012 Time: 4098-1191 PT Time Calculation (min): 24 min  PT Assessment / Plan / Recommendation  History of Present Illness     PT Comments   Pt is back to prior functional status.  She feels well and all weakness/numbness is resolved.  Her O2 sats on RA are 96% with exertion.  Her gait is very stable with a quad cane and I no longer believe that she needs any HHPT.  Recommendation has been changed.  PT will be d/c'd  Follow Up Recommendations  No PT follow up     Does the patient have the potential to tolerate intense rehabilitation     Barriers to Discharge        Equipment Recommendations       Recommendations for Other Services    Frequency     Progress towards PT Goals Progress towards PT goals: Goals met/education completed, patient discharged from PT  Plan Discharge plan needs to be updated    Precautions / Restrictions Precautions Precautions: None Restrictions Weight Bearing Restrictions: No   Pertinent Vitals/Pain     Mobility  Bed Mobility Supine to Sit: 6: Modified independent (Device/Increase time) Transfers Sit to Stand: 6: Modified independent (Device/Increase time);From bed;With upper extremity assist Stand to Sit: 6: Modified independent (Device/Increase time);To chair/3-in-1;With upper extremity assist Ambulation/Gait Ambulation/Gait Assistance: 6: Modified independent (Device/Increase time) Ambulation Distance (Feet): 250 Feet Assistive device: Large base quad cane Gait Pattern: Within Functional Limits Stairs: No Wheelchair Mobility Wheelchair Mobility: No    Exercises     PT Diagnosis:    PT Problem List:   PT Treatment Interventions:     PT Goals (current goals can now be found in the care plan section)    Visit Information  Last PT Received On: 08/13/12    Subjective Data      Cognition  Cognition Arousal/Alertness:  Awake/alert Overall Cognitive Status: Within Functional Limits for tasks assessed    Balance     End of Session PT - End of Session Equipment Utilized During Treatment: Gait belt Activity Tolerance: Patient tolerated treatment well Patient left: in chair;with call bell/phone within reach;with family/visitor present   GP     Myrlene Broker L 08/13/2012, 9:05 AM

## 2012-08-13 NOTE — Progress Notes (Signed)
Marland KitchenngSubjective:  Patient is resting. Her breathing feels better. No fever or chills. No chest pain. EEG was ordered by her neurologist.   Objective: Vital signs in last 24 hours: Temp:  [97.8 F (36.6 C)-98.7 F (37.1 C)] 98.7 F (37.1 C) (08/08 0638) Pulse Rate:  [66-86] 84 (08/08 0638) Resp:  [20] 20 (08/08 0638) BP: (110-114)/(64-70) 110/68 mmHg (08/08 0638) SpO2:  [95 %-100 %] 98 % (08/08 0651) Weight change:  Last BM Date: 08/11/12  Intake/Output from previous day:    PHYSICAL EXAM General appearance: alert and no distress Resp: diminished breath sounds bilaterally and rhonchi bilaterally Cardio: S1, S2 normal GI: soft, non-tender; bowel sounds normal; no masses,  no organomegaly Extremities: extremities normal, atraumatic, no cyanosis or edema  Lab Results:    @labtest @ [facility] No results found for this basename: PHART, PCO2, PO2ART, TCO2, HCO3,  in the last 72 hours CULTURES No results found for this or any previous visit (from the past 240 hour(s)). Studies/Results: Mr Shirlee Latch YQ Contrast  08/11/2012   *RADIOLOGY REPORT*  Clinical Data:  Left-sided weakness and numbness.  History of breast cancer.  History of prior stroke.  MRI BRAIN WITHOUT CONTRAST MRA HEAD WITHOUT CONTRAST  Technique: Multiplanar, multiecho pulse sequences of the brain and surrounding structures were obtained according to standard protocol without intravenous contrast.  Angiographic images of the head were obtained using MRA technique without contrast.  Comparison: 08/10/2012 CT.  05/17/2009 and 09/24/2009 MR brain and 05/17/2009 MRA angiogram circle of Willis.  MRI HEAD  Findings:  Motion degraded exam.  No acute infarct.  No intracranial hemorrhage.  Prominent small vessel disease type changes have progressed since the prior exam.  No intracranial mass lesion detected on this unenhanced exam.  No hydrocephalus.  Hyperostosis frontalis interna incidentally noted.  Major intracranial vascular structures  are patent.  Cervical medullary junction, pituitary region, pineal region and orbital structures unremarkable.  Minimal mucosal thickening ethmoid sinus air cells appear  IMPRESSION: Motion degraded exam.  No acute infarct.  No intracranial hemorrhage.  Prominent small vessel disease type changes have progressed since the prior exam.  MRA HEAD  Findings: Ectatic vertical segment of the internal carotid artery bilaterally.  Moderate narrowing anterior cavernous segment right internal carotid artery.  Mild narrowing proximal M1 segment left middle cerebral artery.  Mild narrowing distal A1 segment anterior cerebral artery bilaterally.  Mild branch vessel irregularity anterior cerebral artery and middle cerebral artery bilaterally.  Ectatic vertebral arteries.  Left vertebral artery is dominant. Mild narrowing distal right vertebral artery.  Moderate narrowing irregularity of the left PICA with poorly delineated right PICA.  Mild narrowing irregularity of the basilar artery.  Tiny right AICA.  Non visualized left AICA.  No aneurysm or vascular malformation noted.  IMPRESSION: Intracranial atherosclerotic type changes as detailed above.   Original Report Authenticated By: Lacy Duverney, M.D.   Mr Brain Wo Contrast  08/11/2012   *RADIOLOGY REPORT*  Clinical Data:  Left-sided weakness and numbness.  History of breast cancer.  History of prior stroke.  MRI BRAIN WITHOUT CONTRAST MRA HEAD WITHOUT CONTRAST  Technique: Multiplanar, multiecho pulse sequences of the brain and surrounding structures were obtained according to standard protocol without intravenous contrast.  Angiographic images of the head were obtained using MRA technique without contrast.  Comparison: 08/10/2012 CT.  05/17/2009 and 09/24/2009 MR brain and 05/17/2009 MRA angiogram circle of Willis.  MRI HEAD  Findings:  Motion degraded exam.  No acute infarct.  No intracranial hemorrhage.  Prominent small  vessel disease type changes have progressed since the  prior exam.  No intracranial mass lesion detected on this unenhanced exam.  No hydrocephalus.  Hyperostosis frontalis interna incidentally noted.  Major intracranial vascular structures are patent.  Cervical medullary junction, pituitary region, pineal region and orbital structures unremarkable.  Minimal mucosal thickening ethmoid sinus air cells appear  IMPRESSION: Motion degraded exam.  No acute infarct.  No intracranial hemorrhage.  Prominent small vessel disease type changes have progressed since the prior exam.  MRA HEAD  Findings: Ectatic vertical segment of the internal carotid artery bilaterally.  Moderate narrowing anterior cavernous segment right internal carotid artery.  Mild narrowing proximal M1 segment left middle cerebral artery.  Mild narrowing distal A1 segment anterior cerebral artery bilaterally.  Mild branch vessel irregularity anterior cerebral artery and middle cerebral artery bilaterally.  Ectatic vertebral arteries.  Left vertebral artery is dominant. Mild narrowing distal right vertebral artery.  Moderate narrowing irregularity of the left PICA with poorly delineated right PICA.  Mild narrowing irregularity of the basilar artery.  Tiny right AICA.  Non visualized left AICA.  No aneurysm or vascular malformation noted.  IMPRESSION: Intracranial atherosclerotic type changes as detailed above.   Original Report Authenticated By: Lacy Duverney, M.D.   US Carotid Duplex Bilateral  08/11/2012   *RADIOLOGY REPORT*  Clinical Data: Left-sided numbness.  History of TIA, syncope and diabetes.  BILATERAL CAROTID DUPLEX ULTRASOUND  Technique: Wallace Cullens scale imaging, color Doppler and duplex ultrasound were performed of bilateral carotid and vertebral arteries in the neck.  Comparison:  None.  Criteria:  Quantification of carotid stenosis is based on velocity parameters that correlate the residual internal carotid diameter with NASCET-based stenosis levels, using the diameter of the distal internal carotid  lumen as the denominator for stenosis measurement.  The following velocity measurements were obtained:                   PEAK SYSTOLIC/END DIASTOLIC RIGHT ICA:                        121/27cm/sec CCA:                        82/12cm/sec SYSTOLIC ICA/CCA RATIO:     1.5 DIASTOLIC ICA/CCA RATIO:    2.3 ECA:                        131cm/sec  LEFT ICA:                        127/23cm/sec CCA:                        88/14cm/sec SYSTOLIC ICA/CCA RATIO:     1.4 DIASTOLIC ICA/CCA RATIO:    1.6 ECA:                        98cm/sec  Findings:  RIGHT CAROTID ARTERY: There is a relatively mild amount of mixed plaque at the right carotid bifurcation.  Estimated right ICA stenosis is less than 50%.  RIGHT VERTEBRAL ARTERY:  Antegrade flow with normal wave form.  LEFT CAROTID ARTERY: A moderate amount of predominately calcified plaque is present at the left carotid bifurcation at the level of the bulb and extending into the proximal ICA.  Proximal ICA velocity is mildly increased and the estimated left ICA stenosis is  50 - 69%.  It is likely closer to 50% based on gray scale imaging and velocity.  LEFT VERTEBRAL ARTERY:  Antegrade flow with normal wave form.  IMPRESSION: Plaque at both carotid bifurcations, left greater than right. Estimated right ICA stenosis is less than 50%.  Estimated left ICA stenosis is 50 - 69%.  Left ICA stenosis is likely closer to 50%.   Original Report Authenticated By: Irish Lack, M.D.    Medications: I have reviewed the patient's current medications.  Assesment:  Principal Problem:   Left sided numbness Active Problems:   HIGH BLOOD PRESSURE   History of stroke   Parkinsons disease   Left hemiparesis   Dysarthria   Seizure disorder   Other and unspecified hyperlipidemia bronchitis   Plan: Continue telemetry Continue nebulizer treatment EEG as planned.    LOS: 3 days   Chere Babson 08/13/2012, 7:57 AM

## 2012-08-13 NOTE — Progress Notes (Addendum)
Pt is complaining of wheezing. Pt is sating at 99% on 2L and received a breathing treatment. She reports feeling better. Will continue to monitor.

## 2012-08-14 DIAGNOSIS — G459 Transient cerebral ischemic attack, unspecified: Secondary | ICD-10-CM | POA: Diagnosis not present

## 2012-08-14 DIAGNOSIS — J209 Acute bronchitis, unspecified: Secondary | ICD-10-CM | POA: Diagnosis not present

## 2012-08-14 MED ORDER — CEFUROXIME AXETIL 250 MG PO TABS
500.0000 mg | ORAL_TABLET | Freq: Two times a day (BID) | ORAL | Status: DC
  Administered 2012-08-14 – 2012-08-15 (×2): 500 mg via ORAL
  Filled 2012-08-14 (×2): qty 2

## 2012-08-14 MED ORDER — ALPRAZOLAM 0.25 MG PO TABS: 0.2500 mg | ORAL_TABLET | Freq: Four times a day (QID) | ORAL | Status: DC | PRN

## 2012-08-14 MED ORDER — GUAIFENESIN ER 600 MG PO TB12
1200.0000 mg | ORAL_TABLET | Freq: Two times a day (BID) | ORAL | Status: DC
  Administered 2012-08-14 – 2012-08-16 (×5): 1200 mg via ORAL
  Filled 2012-08-14: qty 2
  Filled 2012-08-14: qty 1
  Filled 2012-08-14 (×4): qty 2

## 2012-08-14 MED ORDER — BISACODYL 10 MG RE SUPP
10.0000 mg | Freq: Every day | RECTAL | Status: DC | PRN
  Administered 2012-08-14: 10 mg via RECTAL
  Filled 2012-08-14: qty 1

## 2012-08-14 NOTE — Progress Notes (Addendum)
Pt is complaining of sudden onset of anxiety, breaking out in a cold sweat, pain in upper right quadrant, and of being 'hot'. VS are stable and as follows 98.1 F oral temp, 134/77 BP, 77 HR, 97% on 2L  with respirations of 22. Placed fan in patients room to help her cool down. MD made aware and orders received.

## 2012-08-14 NOTE — Progress Notes (Signed)
Subjective: She feels better as far as her neurological situation is concerned but she is very short of breath and congested this morning. She says she's had multiple bouts of bronchitis  Objective: Vital signs in last 24 hours: Temp:  [97.8 F (36.6 C)-98.4 F (36.9 C)] 98.4 F (36.9 C) (08/08 2115) Pulse Rate:  [88-102] 88 (08/08 2115) Resp:  [20-21] 21 (08/08 2115) BP: (121-147)/(66-75) 134/67 mmHg (08/08 2115) SpO2:  [92 %-99 %] 98 % (08/09 0649) Weight change:  Last BM Date: 08/11/12  Intake/Output from previous day: 08/08 0701 - 08/09 0700 In: 480 [P.O.:480] Out: -   PHYSICAL EXAM General appearance: alert, cooperative and mild distress Resp: rhonchi bilaterally Cardio: regular rate and rhythm, S1, S2 normal, no murmur, click, rub or gallop GI: soft, non-tender; bowel sounds normal; no masses,  no organomegaly Extremities: extremities normal, atraumatic, no cyanosis or edema  Lab Results:    Basic Metabolic Panel: No results found for this basename: NA, K, CL, CO2, GLUCOSE, BUN, CREATININE, CALCIUM, MG, PHOS,  in the last 72 hours Liver Function Tests: No results found for this basename: AST, ALT, ALKPHOS, BILITOT, PROT, ALBUMIN,  in the last 72 hours No results found for this basename: LIPASE, AMYLASE,  in the last 72 hours No results found for this basename: AMMONIA,  in the last 72 hours CBC: No results found for this basename: WBC, NEUTROABS, HGB, HCT, MCV, PLT,  in the last 72 hours Cardiac Enzymes: No results found for this basename: CKTOTAL, CKMB, CKMBINDEX, TROPONINI,  in the last 72 hours BNP: No results found for this basename: PROBNP,  in the last 72 hours D-Dimer: No results found for this basename: DDIMER,  in the last 72 hours CBG:  Recent Labs  08/11/12 2326  GLUCAP 120*   Hemoglobin A1C: No results found for this basename: HGBA1C,  in the last 72 hours Fasting Lipid Panel: No results found for this basename: CHOL, HDL, LDLCALC, TRIG,  CHOLHDL, LDLDIRECT,  in the last 72 hours Thyroid Function Tests: No results found for this basename: TSH, T4TOTAL, FREET4, T3FREE, THYROIDAB,  in the last 72 hours Anemia Panel: No results found for this basename: VITAMINB12, FOLATE, FERRITIN, TIBC, IRON, RETICCTPCT,  in the last 72 hours Coagulation: No results found for this basename: LABPROT, INR,  in the last 72 hours Urine Drug Screen: Drugs of Abuse     Component Value Date/Time   LABOPIA POSITIVE* 03/29/2007 1246   COCAINSCRNUR NONE DETECTED 03/29/2007 1246   LABBENZ POSITIVE* 03/29/2007 1246   AMPHETMU NONE DETECTED 03/29/2007 1246   THCU NONE DETECTED 03/29/2007 1246   LABBARB  Value: NONE DETECTED        DRUG SCREEN FOR MEDICAL PURPOSES ONLY.  IF CONFIRMATION IS NEEDED FOR ANY PURPOSE, NOTIFY LAB WITHIN 5 DAYS. 03/29/2007 1246    Alcohol Level: No results found for this basename: ETH,  in the last 72 hours Urinalysis: No results found for this basename: COLORURINE, APPERANCEUR, LABSPEC, PHURINE, GLUCOSEU, HGBUR, BILIRUBINUR, KETONESUR, PROTEINUR, UROBILINOGEN, NITRITE, LEUKOCYTESUR,  in the last 72 hours Misc. Labs:  [facility] No results found for this basename: PHART, PCO2, PO2ART, TCO2, HCO3,  in the last 72 hours CULTURES No results found for this or any previous visit (from the past 240 hour(s)). Studies/Results: No results found.  Medications:  Prior to Admission:  Prescriptions prior to admission  Medication Sig Dispense Refill  . ALPRAZolam (XANAX) 0.25 MG tablet Take 0.25 mg by mouth 3 (three) times daily as needed for sleep or  anxiety.      Marland Kitchen amLODipine (NORVASC) 5 MG tablet Take 5 mg by mouth daily.      Marland Kitchen aspirin EC 81 MG tablet Take 81 mg by mouth daily.      Marland Kitchen donepezil (ARICEPT) 5 MG tablet Take 5 mg by mouth daily.      . ferrous sulfate 325 (65 FE) MG tablet Take 325 mg by mouth daily.      Marland Kitchen FLUoxetine (PROZAC) 20 MG capsule Take 20 mg by mouth daily.      . folic acid (FOLVITE) 1 MG tablet Take 1 mg by mouth  daily.      . furosemide (LASIX) 20 MG tablet Take 20 mg by mouth daily.      Marland Kitchen gabapentin (NEURONTIN) 300 MG capsule Take 1 capsule (300 mg total) by mouth 3 (three) times daily.  30 capsule  1  . HYDROcodone-acetaminophen (NORCO/VICODIN) 5-325 MG per tablet Take 1 tablet by mouth every 4 (four) hours as needed for pain.  60 tablet  1  . latanoprost (XALATAN) 0.005 % ophthalmic solution Place 1 drop into both eyes at bedtime.      . levETIRAcetam (KEPPRA) 250 MG tablet Take 250 mg by mouth 2 (two) times daily.      Marland Kitchen olmesartan (BENICAR) 40 MG tablet Take 40 mg by mouth daily.      Marland Kitchen omeprazole (PRILOSEC) 20 MG capsule Take 20 mg by mouth daily.      Marland Kitchen albuterol (PROVENTIL HFA;VENTOLIN HFA) 108 (90 BASE) MCG/ACT inhaler Inhale 1-2 puffs into the lungs every 6 (six) hours as needed for wheezing.  1 Inhaler  0  . Calcium Carbonate-Vitamin D (CALCIUM + D) 600-200 MG-UNIT TABS Take 1 tablet by mouth every evening.       Marland Kitchen ibuprofen (ADVIL,MOTRIN) 800 MG tablet Take 1 tablet (800 mg total) by mouth every 8 (eight) hours as needed for pain.  90 tablet  5  . lovastatin (MEVACOR) 10 MG tablet Take 10 mg by mouth at bedtime.      . meclizine (ANTIVERT) 25 MG tablet Take 25 mg by mouth every 6 (six) hours as needed. dizziness      . methylPREDNISolone (MEDROL, PAK,) 4 MG tablet follow package directions  21 tablet  0  . potassium chloride SA (K-DUR,KLOR-CON) 20 MEQ tablet Take 0.5 tablets (10 mEq total) by mouth daily.  30 tablet  3   Scheduled: . albuterol  2.5 mg Nebulization TID  . ALPRAZolam  0.25 mg Oral BID  . amLODipine  5 mg Oral Daily  . cefUROXime  500 mg Oral BID WC  . clopidogrel  75 mg Oral Q breakfast  . donepezil  5 mg Oral Daily  . ferrous sulfate  325 mg Oral Daily  . FLUoxetine  20 mg Oral Daily  . folic acid  1 mg Oral Daily  . furosemide  20 mg Oral Daily  . guaiFENesin  1,200 mg Oral BID  . heparin  5,000 Units Subcutaneous Q8H  . ipratropium  0.5 mg Nebulization TID  .  irbesartan  300 mg Oral Daily  . levETIRAcetam  250 mg Oral Daily  . levETIRAcetam  500 mg Oral QHS  . pantoprazole  40 mg Oral Daily  . simvastatin  5 mg Oral q1800   Continuous:  RUE:[id number], acetaminophen, albuterol, HYDROcodone-acetaminophen, meclizine, senna-docusate  Assesment: She now has acute bronchitis. I don't think she can go home. She was admitted with a probable TIA and that has improved. It  is possible that what she had was a seizure Principal Problem:   Left sided numbness Active Problems:   HIGH BLOOD PRESSURE   History of stroke   Parkinsons disease   Left hemiparesis   Dysarthria   Seizure disorder   Other and unspecified hyperlipidemia    Plan: She will be treated for bronchitis. She may be able to be discharged home tomorrow    LOS: 4 days   Rahaf Carbonell L 08/14/2012, 10:34 AM

## 2012-08-15 MED ORDER — DEXTROSE 5 % IV SOLN
1.0000 g | INTRAVENOUS | Status: DC
  Administered 2012-08-15: 1 g via INTRAVENOUS
  Filled 2012-08-15 (×2): qty 10

## 2012-08-15 MED ORDER — METHYLPREDNISOLONE 4 MG PO TABS
4.0000 mg | ORAL_TABLET | Freq: Two times a day (BID) | ORAL | Status: DC
  Administered 2012-08-15 – 2012-08-16 (×3): 4 mg via ORAL
  Filled 2012-08-15 (×7): qty 1

## 2012-08-15 NOTE — Progress Notes (Signed)
Subjective: She still doesn't feel well. She has had low-grade fever and is still short of breath and coughing.  Objective: Vital signs in last 24 hours: Temp:  [97.9 F (36.6 C)-99.3 F (37.4 C)] 97.9 F (36.6 C) (08/10 0500) Pulse Rate:  [76-100] 94 (08/10 0500) Resp:  [20-22] 20 (08/10 0500) BP: (99-142)/(49-77) 128/57 mmHg (08/10 0500) SpO2:  [88 %-100 %] 95 % (08/10 0707) Weight change:  Last BM Date: 08/15/12  Intake/Output from previous day: 08/09 0701 - 08/10 0700 In: 240 [P.O.:240] Out: -   PHYSICAL EXAM General appearance: alert, cooperative and mild distress Resp: rhonchi bilaterally Cardio: regular rate and rhythm, S1, S2 normal, no murmur, click, rub or gallop GI: soft, non-tender; bowel sounds normal; no masses,  no organomegaly Extremities: extremities normal, atraumatic, no cyanosis or edema  Lab Results:    Basic Metabolic Panel: No results found for this basename: NA, K, CL, CO2, GLUCOSE, BUN, CREATININE, CALCIUM, MG, PHOS,  in the last 72 hours Liver Function Tests: No results found for this basename: AST, ALT, ALKPHOS, BILITOT, PROT, ALBUMIN,  in the last 72 hours No results found for this basename: LIPASE, AMYLASE,  in the last 72 hours No results found for this basename: AMMONIA,  in the last 72 hours CBC: No results found for this basename: WBC, NEUTROABS, HGB, HCT, MCV, PLT,  in the last 72 hours Cardiac Enzymes: No results found for this basename: CKTOTAL, CKMB, CKMBINDEX, TROPONINI,  in the last 72 hours BNP: No results found for this basename: PROBNP,  in the last 72 hours D-Dimer: No results found for this basename: DDIMER,  in the last 72 hours CBG: No results found for this basename: GLUCAP,  in the last 72 hours Hemoglobin A1C: No results found for this basename: HGBA1C,  in the last 72 hours Fasting Lipid Panel: No results found for this basename: CHOL, HDL, LDLCALC, TRIG, CHOLHDL, LDLDIRECT,  in the last 72 hours Thyroid Function  Tests: No results found for this basename: TSH, T4TOTAL, FREET4, T3FREE, THYROIDAB,  in the last 72 hours Anemia Panel: No results found for this basename: VITAMINB12, FOLATE, FERRITIN, TIBC, IRON, RETICCTPCT,  in the last 72 hours Coagulation: No results found for this basename: LABPROT, INR,  in the last 72 hours Urine Drug Screen: Drugs of Abuse     Component Value Date/Time   LABOPIA POSITIVE* 03/29/2007 1246   COCAINSCRNUR NONE DETECTED 03/29/2007 1246   LABBENZ POSITIVE* 03/29/2007 1246   AMPHETMU NONE DETECTED 03/29/2007 1246   THCU NONE DETECTED 03/29/2007 1246   LABBARB  Value: NONE DETECTED        DRUG SCREEN FOR MEDICAL PURPOSES ONLY.  IF CONFIRMATION IS NEEDED FOR ANY PURPOSE, NOTIFY LAB WITHIN 5 DAYS. 03/29/2007 1246    Alcohol Level: No results found for this basename: ETH,  in the last 72 hours Urinalysis: No results found for this basename: COLORURINE, APPERANCEUR, LABSPEC, PHURINE, GLUCOSEU, HGBUR, BILIRUBINUR, KETONESUR, PROTEINUR, UROBILINOGEN, NITRITE, LEUKOCYTESUR,  in the last 72 hours Misc. Labs:  ABGS No results found for this basename: PHART, PCO2, PO2ART, TCO2, HCO3,  in the last 72 hours CULTURES No results found for this or any previous visit (from the past 240 hour(s)). Studies/Results: No results found.  Medications:  Prior to Admission:  Prescriptions prior to admission  Medication Sig Dispense Refill  . ALPRAZolam (XANAX) 0.25 MG tablet Take 0.25 mg by mouth 3 (three) times daily as needed for sleep or anxiety.      Marland Kitchen amLODipine (NORVASC) 5  MG tablet Take 5 mg by mouth daily.      Marland Kitchen aspirin EC 81 MG tablet Take 81 mg by mouth daily.      Marland Kitchen donepezil (ARICEPT) 5 MG tablet Take 5 mg by mouth daily.      . ferrous sulfate 325 (65 FE) MG tablet Take 325 mg by mouth daily.      Marland Kitchen FLUoxetine (PROZAC) 20 MG capsule Take 20 mg by mouth daily.      . folic acid (FOLVITE) 1 MG tablet Take 1 mg by mouth daily.      . furosemide (LASIX) 20 MG tablet Take 20 mg  by mouth daily.      Marland Kitchen gabapentin (NEURONTIN) 300 MG capsule Take 1 capsule (300 mg total) by mouth 3 (three) times daily.  30 capsule  1  . HYDROcodone-acetaminophen (NORCO/VICODIN) 5-325 MG per tablet Take 1 tablet by mouth every 4 (four) hours as needed for pain.  60 tablet  1  . latanoprost (XALATAN) 0.005 % ophthalmic solution Place 1 drop into both eyes at bedtime.      . levETIRAcetam (KEPPRA) 250 MG tablet Take 250 mg by mouth 2 (two) times daily.      Marland Kitchen olmesartan (BENICAR) 40 MG tablet Take 40 mg by mouth daily.      Marland Kitchen omeprazole (PRILOSEC) 20 MG capsule Take 20 mg by mouth daily.      Marland Kitchen albuterol (PROVENTIL HFA;VENTOLIN HFA) 108 (90 BASE) MCG/ACT inhaler Inhale 1-2 puffs into the lungs every 6 (six) hours as needed for wheezing.  1 Inhaler  0  . Calcium Carbonate-Vitamin D (CALCIUM + D) 600-200 MG-UNIT TABS Take 1 tablet by mouth every evening.       Marland Kitchen ibuprofen (ADVIL,MOTRIN) 800 MG tablet Take 1 tablet (800 mg total) by mouth every 8 (eight) hours as needed for pain.  90 tablet  5  . lovastatin (MEVACOR) 10 MG tablet Take 10 mg by mouth at bedtime.      . meclizine (ANTIVERT) 25 MG tablet Take 25 mg by mouth every 6 (six) hours as needed. dizziness      . methylPREDNISolone (MEDROL, PAK,) 4 MG tablet follow package directions  21 tablet  0  . potassium chloride SA (K-DUR,KLOR-CON) 20 MEQ tablet Take 0.5 tablets (10 mEq total) by mouth daily.  30 tablet  3   Scheduled: . albuterol  2.5 mg Nebulization TID  . amLODipine  5 mg Oral Daily  . cefUROXime  500 mg Oral BID WC  . clopidogrel  75 mg Oral Q breakfast  . donepezil  5 mg Oral Daily  . ferrous sulfate  325 mg Oral Daily  . FLUoxetine  20 mg Oral Daily  . folic acid  1 mg Oral Daily  . furosemide  20 mg Oral Daily  . guaiFENesin  1,200 mg Oral BID  . heparin  5,000 Units Subcutaneous Q8H  . ipratropium  0.5 mg Nebulization TID  . irbesartan  300 mg Oral Daily  . levETIRAcetam  250 mg Oral Daily  . levETIRAcetam  500 mg  Oral QHS  . pantoprazole  40 mg Oral Daily  . simvastatin  5 mg Oral q1800   Continuous:  RUE:[id number], acetaminophen, albuterol, ALPRAZolam, bisacodyl, HYDROcodone-acetaminophen, meclizine, senna-docusate  Assesment: She was admitted with what was thought to be a TIA. She now has what seems to be acute bronchitis and she's having more trouble. I don't think she's ready for discharge. She does have seizure disorder but is not  clear that her problem was related to a seizure Principal Problem:   Left sided numbness Active Problems:   HIGH BLOOD PRESSURE   History of stroke   Parkinsons disease   Left hemiparesis   Dysarthria   Seizure disorder   Other and unspecified hyperlipidemia    Plan: Continue current treatments. I will add steroids    LOS: 5 days   Krishon Adkison L 08/15/2012, 10:43 AM

## 2012-08-15 NOTE — Progress Notes (Signed)
Put Pt on RA and her SATS were 88-89%. RT placed her on 1.5L [place]

## 2012-08-16 ENCOUNTER — Other Ambulatory Visit (HOSPITAL_COMMUNITY): Payer: Self-pay | Admitting: Neurology

## 2012-08-16 ENCOUNTER — Ambulatory Visit (HOSPITAL_COMMUNITY)
Admit: 2012-08-16 | Discharge: 2012-08-16 | Disposition: A | Discharge status: Home / Self Care | Payer: Medicare Other | Source: Ambulatory Visit | Attending: Neurology | Admitting: Neurology

## 2012-08-16 DIAGNOSIS — R569 Unspecified convulsions: Secondary | ICD-10-CM

## 2012-08-16 DIAGNOSIS — E119 Type 2 diabetes mellitus without complications: Secondary | ICD-10-CM | POA: Diagnosis not present

## 2012-08-16 DIAGNOSIS — G459 Transient cerebral ischemic attack, unspecified: Secondary | ICD-10-CM | POA: Diagnosis not present

## 2012-08-16 DIAGNOSIS — J410 Simple chronic bronchitis: Secondary | ICD-10-CM | POA: Diagnosis not present

## 2012-08-16 DIAGNOSIS — I10 Essential (primary) hypertension: Secondary | ICD-10-CM | POA: Diagnosis not present

## 2012-08-16 DIAGNOSIS — G40909 Epilepsy, unspecified, not intractable, without status epilepticus: Secondary | ICD-10-CM | POA: Insufficient documentation

## 2012-08-16 MED ORDER — AMOXICILLIN-POT CLAVULANATE 500-125 MG PO TABS: 1.0000 | ORAL_TABLET | Freq: Three times a day (TID) | ORAL | Status: DC

## 2012-08-16 MED ORDER — IPRATROPIUM BROMIDE 0.02 % IN SOLN: 0.5000 mg | Freq: Three times a day (TID) | RESPIRATORY_TRACT | Status: DC

## 2012-08-16 MED ORDER — CLOPIDOGREL BISULFATE 75 MG PO TABS: 75.0000 mg | ORAL_TABLET | Freq: Every day | ORAL | Status: DC

## 2012-08-16 MED ORDER — ALBUTEROL SULFATE (5 MG/ML) 0.5% IN NEBU: 2.5000 mg | INHALATION_SOLUTION | RESPIRATORY_TRACT | Status: DC | PRN

## 2012-08-16 NOTE — Progress Notes (Signed)
Did walking sats with pt.  At rest on RA pt sats 95%  Standing on RA pt sats at 93%  With ambulation on RA pt sat at 92%  Will continue to monitor.

## 2012-08-16 NOTE — Progress Notes (Signed)
EEG Completed; Results Pending  

## 2012-08-16 NOTE — Discharge Summary (Signed)
Physician Discharge Summary  Patient ID: Tiffany Hartman MRN: [id number] DOB/AGE: 1927-02-27 79 y.o. Primary Care Physician:Tarrah Furuta, MD Admit date: 08/10/2012 Discharge date: 08/16/2012    Discharge Diagnoses:   Principal Problem:   Left sided numbness Active Problems:   HIGH BLOOD PRESSURE   History of stroke   Parkinsons disease   Left hemiparesis   Dysarthria   Seizure disorder   Other and unspecified hyperlipidemia Acute bronchitis    Medication List    STOP taking these medications       ibuprofen 800 MG tablet  Commonly known as:  ADVIL,MOTRIN      TAKE these medications       albuterol 108 (90 BASE) MCG/ACT inhaler  Commonly known as:  PROVENTIL HFA;VENTOLIN HFA  Inhale 1-2 puffs into the lungs every 6 (six) hours as needed for wheezing.     albuterol (5 MG/ML) 0.5% nebulizer solution  Commonly known as:  PROVENTIL  Take 0.5 mLs (2.5 mg total) by nebulization every 4 (four) hours as needed for wheezing or shortness of breath.     ALPRAZolam 0.25 MG tablet  Commonly known as:  XANAX  Take 0.25 mg by mouth 3 (three) times daily as needed for sleep or anxiety.     amLODipine 5 MG tablet  Commonly known as:  NORVASC  Take 5 mg by mouth daily.     amoxicillin-clavulanate 500-125 MG per tablet  Commonly known as:  AUGMENTIN  Take 1 tablet (500 mg total) by mouth 3 (three) times daily.     aspirin EC 81 MG tablet  Take 81 mg by mouth daily.     CALCIUM + D 600-200 MG-UNIT Tabs  Generic drug:  Calcium Carbonate-Vitamin D  Take 1 tablet by mouth every evening.     clopidogrel 75 MG tablet  Commonly known as:  PLAVIX  Take 1 tablet (75 mg total) by mouth daily with breakfast.     donepezil 5 MG tablet  Commonly known as:  ARICEPT  Take 5 mg by mouth daily.     ferrous sulfate 325 (65 FE) MG tablet  Take 325 mg by mouth daily.     FLUoxetine 20 MG capsule  Commonly known as:  PROZAC  Take 20 mg by mouth daily.     folic acid 1 MG tablet   Commonly known as:  FOLVITE  Take 1 mg by mouth daily.     furosemide 20 MG tablet  Commonly known as:  LASIX  Take 20 mg by mouth daily.     gabapentin 300 MG capsule  Commonly known as:  NEURONTIN  Take 1 capsule (300 mg total) by mouth 3 (three) times daily.     HYDROcodone-acetaminophen 5-325 MG per tablet  Commonly known as:  NORCO/VICODIN  Take 1 tablet by mouth every 4 (four) hours as needed for pain.     ipratropium 0.02 % nebulizer solution  Commonly known as:  ATROVENT  Take 2.5 mLs (0.5 mg total) by nebulization 3 (three) times daily.     latanoprost 0.005 % ophthalmic solution  Commonly known as:  XALATAN  Place 1 drop into both eyes at bedtime.     levETIRAcetam 250 MG tablet  Commonly known as:  KEPPRA  Take 250 mg by mouth 2 (two) times daily.     lovastatin 10 MG tablet  Commonly known as:  MEVACOR  Take 10 mg by mouth at bedtime.     meclizine 25 MG tablet  Commonly known as:  ANTIVERT  Take 25 mg by mouth every 6 (six) hours as needed. dizziness     methylPREDNISolone 4 MG tablet  Commonly known as:  MEDROL (PAK)  follow package directions     olmesartan 40 MG tablet  Commonly known as:  BENICAR  Take 40 mg by mouth daily.     omeprazole 20 MG capsule  Commonly known as:  PRILOSEC  Take 20 mg by mouth daily.     potassium chloride SA 20 MEQ tablet  Commonly known as:  K-DUR,KLOR-CON  Take 0.5 tablets (10 mEq total) by mouth daily.        Discharged Condition: improved    Consults: Neurology  Significant Diagnostic Studies: Dg Chest 2 View  08/10/2012   *RADIOLOGY REPORT*  Clinical Data: History of breast cancer  CHEST - 2 VIEW  Comparison: July 16, 2011  Findings: There is no focal infiltrate, pulmonary edema, or pleural effusion.  Mediastinal contour and cardiac silhouette are stable. The soft tissues and osseous structures are stable.  IMPRESSION: No acute cardiopulmonary disease identified.   Original Report Authenticated By:  Sherian Rein, M.D.   Ct Head Wo Contrast  08/10/2012   *RADIOLOGY REPORT*  Clinical Data: Stroke.  Slurred speech.  CT HEAD WITHOUT CONTRAST  Technique:  Contiguous axial images were obtained from the base of the skull through the vertex without contrast.  Comparison: CT scan dated 09/22/2009  Findings: There is no acute intracranial hemorrhage, infarction, or mass lesion.  There are multiple old small lacunar infarcts including the most prominent area in the right centrum semiovale, unchanged.  No ventricular dilatation.  No acute osseous abnormality.  IMPRESSION: No acute abnormalities.  Multiple old infarcts.   Original Report Authenticated By: Francene Boyers, M.D.   Mr [facility] Wo Contrast  08/11/2012   *RADIOLOGY REPORT*  Clinical Data:  Left-sided weakness and numbness.  History of breast cancer.  History of prior stroke.  MRI BRAIN WITHOUT CONTRAST MRA HEAD WITHOUT CONTRAST  Technique: Multiplanar, multiecho pulse sequences of the brain and surrounding structures were obtained according to standard protocol without intravenous contrast.  Angiographic images of the head were obtained using MRA technique without contrast.  Comparison: 08/10/2012 CT.  05/17/2009 and 09/24/2009 MR brain and 05/17/2009 MRA angiogram circle of Willis.  MRI HEAD  Findings:  Motion degraded exam.  No acute infarct.  No intracranial hemorrhage.  Prominent small vessel disease type changes have progressed since the prior exam.  No intracranial mass lesion detected on this unenhanced exam.  No hydrocephalus.  Hyperostosis frontalis interna incidentally noted.  Major intracranial vascular structures are patent.  Cervical medullary junction, pituitary region, pineal region and orbital structures unremarkable.  Minimal mucosal thickening ethmoid sinus air cells appear  IMPRESSION: Motion degraded exam.  No acute infarct.  No intracranial hemorrhage.  Prominent small vessel disease type changes have progressed since the prior exam.  MRA  HEAD  Findings: Ectatic vertical segment of the internal carotid artery bilaterally.  Moderate narrowing anterior cavernous segment right internal carotid artery.  Mild narrowing proximal M1 segment left middle cerebral artery.  Mild narrowing distal A1 segment anterior cerebral artery bilaterally.  Mild branch vessel irregularity anterior cerebral artery and middle cerebral artery bilaterally.  Ectatic vertebral arteries.  Left vertebral artery is dominant. Mild narrowing distal right vertebral artery.  Moderate narrowing irregularity of the left PICA with poorly delineated right PICA.  Mild narrowing irregularity of the basilar artery.  Tiny right AICA.  Non visualized left AICA.  No aneurysm or vascular  malformation noted.  IMPRESSION: Intracranial atherosclerotic type changes as detailed above.   Original Report Authenticated By: Lacy Duverney, M.D.   Mr Brain Wo Contrast  08/11/2012   *RADIOLOGY REPORT*  Clinical Data:  Left-sided weakness and numbness.  History of breast cancer.  History of prior stroke.  MRI BRAIN WITHOUT CONTRAST MRA HEAD WITHOUT CONTRAST  Technique: Multiplanar, multiecho pulse sequences of the brain and surrounding structures were obtained according to standard protocol without intravenous contrast.  Angiographic images of the head were obtained using MRA technique without contrast.  Comparison: 08/10/2012 CT.  05/17/2009 and 09/24/2009 MR brain and 05/17/2009 MRA angiogram circle of Willis.  MRI HEAD  Findings:  Motion degraded exam.  No acute infarct.  No intracranial hemorrhage.  Prominent small vessel disease type changes have progressed since the prior exam.  No intracranial mass lesion detected on this unenhanced exam.  No hydrocephalus.  Hyperostosis frontalis interna incidentally noted.  Major intracranial vascular structures are patent.  Cervical medullary junction, pituitary region, pineal region and orbital structures unremarkable.  Minimal mucosal thickening ethmoid sinus air  cells appear  IMPRESSION: Motion degraded exam.  No acute infarct.  No intracranial hemorrhage.  Prominent small vessel disease type changes have progressed since the prior exam.  MRA HEAD  Findings: Ectatic vertical segment of the internal carotid artery bilaterally.  Moderate narrowing anterior cavernous segment right internal carotid artery.  Mild narrowing proximal M1 segment left middle cerebral artery.  Mild narrowing distal A1 segment anterior cerebral artery bilaterally.  Mild branch vessel irregularity anterior cerebral artery and middle cerebral artery bilaterally.  Ectatic vertebral arteries.  Left vertebral artery is dominant. Mild narrowing distal right vertebral artery.  Moderate narrowing irregularity of the left PICA with poorly delineated right PICA.  Mild narrowing irregularity of the basilar artery.  Tiny right AICA.  Non visualized left AICA.  No aneurysm or vascular malformation noted.  IMPRESSION: Intracranial atherosclerotic type changes as detailed above.   Original Report Authenticated By: Lacy Duverney, M.D.   US Carotid Duplex Bilateral  08/11/2012   *RADIOLOGY REPORT*  Clinical Data: Left-sided numbness.  History of TIA, syncope and diabetes.  BILATERAL CAROTID DUPLEX ULTRASOUND  Technique: Wallace Cullens scale imaging, color Doppler and duplex ultrasound were performed of bilateral carotid and vertebral arteries in the neck.  Comparison:  None.  Criteria:  Quantification of carotid stenosis is based on velocity parameters that correlate the residual internal carotid diameter with NASCET-based stenosis levels, using the diameter of the distal internal carotid lumen as the denominator for stenosis measurement.  The following velocity measurements were obtained:                   PEAK SYSTOLIC/END DIASTOLIC RIGHT ICA:                        121/27cm/sec CCA:                        82/12cm/sec SYSTOLIC ICA/CCA RATIO:     1.5 DIASTOLIC ICA/CCA RATIO:    2.3 ECA:                        131cm/sec  LEFT  ICA:                        127/23cm/sec CCA:  88/14cm/sec SYSTOLIC ICA/CCA RATIO:     1.4 DIASTOLIC ICA/CCA RATIO:    1.6 ECA:                        98cm/sec  Findings:  RIGHT CAROTID ARTERY: There is a relatively mild amount of mixed plaque at the right carotid bifurcation.  Estimated right ICA stenosis is less than 50%.  RIGHT VERTEBRAL ARTERY:  Antegrade flow with normal wave form.  LEFT CAROTID ARTERY: A moderate amount of predominately calcified plaque is present at the left carotid bifurcation at the level of the bulb and extending into the proximal ICA.  Proximal ICA velocity is mildly increased and the estimated left ICA stenosis is 50 - 69%.  It is likely closer to 50% based on gray scale imaging and velocity.  LEFT VERTEBRAL ARTERY:  Antegrade flow with normal wave form.  IMPRESSION: Plaque at both carotid bifurcations, left greater than right. Estimated right ICA stenosis is less than 50%.  Estimated left ICA stenosis is 50 - 69%.  Left ICA stenosis is likely closer to 50%.   Original Report Authenticated By: Irish Lack, M.D.    Lab Results: Basic Metabolic Panel: No results found for this basename: NA, K, CL, CO2, GLUCOSE, BUN, CREATININE, CALCIUM, MG, PHOS,  in the last 72 hours Liver Function Tests: No results found for this basename: AST, ALT, ALKPHOS, BILITOT, PROT, ALBUMIN,  in the last 72 hours   CBC: No results found for this basename: WBC, NEUTROABS, HGB, HCT, MCV, PLT,  in the last 72 hours  No results found for this or any previous visit (from the past 240 hour(s)).   Hospital Course:   This is an 79 years old female with history of multiple medical illnesses was admitted due to lt side numbness. He CT scan and MRI was negative. Patient was treated as as case of TIA. While she was in the hospital she was treated for bronchitis with antibiotics, steroid and nebulizer. Patient overall improved and discharged home to be followed in out  patient.  Discharge Exam: Blood pressure 122/75, pulse 83, temperature 98.6 F (37 C), temperature source Oral, resp. rate 18, height 5\' 3"  (1.6 m), weight 104.5 kg (230 lb 6.1 oz), SpO2 98.00%.    Disposition:  home       Future Appointments Provider Department Dept Phone   08/16/2012 2:30 PM [facility] [phone]      Follow-up Information   Follow up with [facility].   Contact information:   [address] [place] [address] [phone]      Follow up with [facility], MD.   Contact information:   [address] [place] [address] [phone]       Signed: Twinkle Sockwell  08/16/2012, 7:51 AM

## 2012-08-16 NOTE — Progress Notes (Signed)
UR chart review completed.  

## 2012-08-16 NOTE — Progress Notes (Signed)
Pt is to be discharged home today. Pt is in NAD, IV is out, all paperwork has been reviewed/discussed with patient, and there are no questions/concerns at this time. Assessment is unchanged from this morning. Pt is to be accompanied downstairs by staff and family via wheelchair.  

## 2012-08-24 DIAGNOSIS — E119 Type 2 diabetes mellitus without complications: Secondary | ICD-10-CM | POA: Diagnosis not present

## 2012-08-24 DIAGNOSIS — I10 Essential (primary) hypertension: Secondary | ICD-10-CM | POA: Diagnosis not present

## 2012-08-24 DIAGNOSIS — G459 Transient cerebral ischemic attack, unspecified: Secondary | ICD-10-CM | POA: Diagnosis not present

## 2012-08-24 DIAGNOSIS — G20 Parkinson's disease: Secondary | ICD-10-CM | POA: Diagnosis not present

## 2012-08-26 DIAGNOSIS — E669 Obesity, unspecified: Secondary | ICD-10-CM | POA: Diagnosis not present

## 2012-08-26 DIAGNOSIS — E785 Hyperlipidemia, unspecified: Secondary | ICD-10-CM | POA: Diagnosis not present

## 2012-08-26 DIAGNOSIS — F329 Major depressive disorder, single episode, unspecified: Secondary | ICD-10-CM | POA: Diagnosis not present

## 2012-08-26 DIAGNOSIS — M159 Polyosteoarthritis, unspecified: Secondary | ICD-10-CM | POA: Diagnosis not present

## 2012-08-26 DIAGNOSIS — E119 Type 2 diabetes mellitus without complications: Secondary | ICD-10-CM | POA: Diagnosis not present

## 2012-08-26 DIAGNOSIS — I10 Essential (primary) hypertension: Secondary | ICD-10-CM | POA: Diagnosis not present

## 2012-08-26 DIAGNOSIS — G20 Parkinson's disease: Secondary | ICD-10-CM | POA: Diagnosis not present

## 2012-08-26 DIAGNOSIS — Z5189 Encounter for other specified aftercare: Secondary | ICD-10-CM | POA: Diagnosis not present

## 2012-08-30 DIAGNOSIS — G20 Parkinson's disease: Secondary | ICD-10-CM | POA: Diagnosis not present

## 2012-08-30 DIAGNOSIS — E119 Type 2 diabetes mellitus without complications: Secondary | ICD-10-CM | POA: Diagnosis not present

## 2012-08-30 DIAGNOSIS — Z5189 Encounter for other specified aftercare: Secondary | ICD-10-CM | POA: Diagnosis not present

## 2012-08-30 DIAGNOSIS — F329 Major depressive disorder, single episode, unspecified: Secondary | ICD-10-CM | POA: Diagnosis not present

## 2012-08-30 DIAGNOSIS — I10 Essential (primary) hypertension: Secondary | ICD-10-CM | POA: Diagnosis not present

## 2012-08-30 DIAGNOSIS — M159 Polyosteoarthritis, unspecified: Secondary | ICD-10-CM | POA: Diagnosis not present

## 2012-08-31 DIAGNOSIS — I10 Essential (primary) hypertension: Secondary | ICD-10-CM | POA: Diagnosis not present

## 2012-08-31 DIAGNOSIS — G20 Parkinson's disease: Secondary | ICD-10-CM | POA: Diagnosis not present

## 2012-08-31 DIAGNOSIS — Z5189 Encounter for other specified aftercare: Secondary | ICD-10-CM | POA: Diagnosis not present

## 2012-08-31 DIAGNOSIS — E119 Type 2 diabetes mellitus without complications: Secondary | ICD-10-CM | POA: Diagnosis not present

## 2012-08-31 DIAGNOSIS — F329 Major depressive disorder, single episode, unspecified: Secondary | ICD-10-CM | POA: Diagnosis not present

## 2012-08-31 DIAGNOSIS — M159 Polyosteoarthritis, unspecified: Secondary | ICD-10-CM | POA: Diagnosis not present

## 2012-09-01 DIAGNOSIS — M159 Polyosteoarthritis, unspecified: Secondary | ICD-10-CM | POA: Diagnosis not present

## 2012-09-01 DIAGNOSIS — E119 Type 2 diabetes mellitus without complications: Secondary | ICD-10-CM | POA: Diagnosis not present

## 2012-09-01 DIAGNOSIS — Z5189 Encounter for other specified aftercare: Secondary | ICD-10-CM | POA: Diagnosis not present

## 2012-09-01 DIAGNOSIS — I10 Essential (primary) hypertension: Secondary | ICD-10-CM | POA: Diagnosis not present

## 2012-09-01 DIAGNOSIS — F329 Major depressive disorder, single episode, unspecified: Secondary | ICD-10-CM | POA: Diagnosis not present

## 2012-09-01 DIAGNOSIS — G20 Parkinson's disease: Secondary | ICD-10-CM | POA: Diagnosis not present

## 2012-09-02 DIAGNOSIS — F329 Major depressive disorder, single episode, unspecified: Secondary | ICD-10-CM | POA: Diagnosis not present

## 2012-09-02 DIAGNOSIS — Z5189 Encounter for other specified aftercare: Secondary | ICD-10-CM | POA: Diagnosis not present

## 2012-09-02 DIAGNOSIS — G20 Parkinson's disease: Secondary | ICD-10-CM | POA: Diagnosis not present

## 2012-09-02 DIAGNOSIS — E119 Type 2 diabetes mellitus without complications: Secondary | ICD-10-CM | POA: Diagnosis not present

## 2012-09-02 DIAGNOSIS — I10 Essential (primary) hypertension: Secondary | ICD-10-CM | POA: Diagnosis not present

## 2012-09-02 DIAGNOSIS — M159 Polyosteoarthritis, unspecified: Secondary | ICD-10-CM | POA: Diagnosis not present

## 2012-09-03 ENCOUNTER — Other Ambulatory Visit (HOSPITAL_COMMUNITY): Payer: Self-pay | Admitting: "Endocrinology

## 2012-09-03 DIAGNOSIS — E049 Nontoxic goiter, unspecified: Secondary | ICD-10-CM

## 2012-09-07 DIAGNOSIS — Z5189 Encounter for other specified aftercare: Secondary | ICD-10-CM | POA: Diagnosis not present

## 2012-09-07 DIAGNOSIS — G20 Parkinson's disease: Secondary | ICD-10-CM | POA: Diagnosis not present

## 2012-09-07 DIAGNOSIS — E119 Type 2 diabetes mellitus without complications: Secondary | ICD-10-CM | POA: Diagnosis not present

## 2012-09-07 DIAGNOSIS — F329 Major depressive disorder, single episode, unspecified: Secondary | ICD-10-CM | POA: Diagnosis not present

## 2012-09-07 DIAGNOSIS — I10 Essential (primary) hypertension: Secondary | ICD-10-CM | POA: Diagnosis not present

## 2012-09-07 DIAGNOSIS — M159 Polyosteoarthritis, unspecified: Secondary | ICD-10-CM | POA: Diagnosis not present

## 2012-09-08 DIAGNOSIS — M159 Polyosteoarthritis, unspecified: Secondary | ICD-10-CM | POA: Diagnosis not present

## 2012-09-08 DIAGNOSIS — E119 Type 2 diabetes mellitus without complications: Secondary | ICD-10-CM | POA: Diagnosis not present

## 2012-09-08 DIAGNOSIS — G20 Parkinson's disease: Secondary | ICD-10-CM | POA: Diagnosis not present

## 2012-09-08 DIAGNOSIS — Z5189 Encounter for other specified aftercare: Secondary | ICD-10-CM | POA: Diagnosis not present

## 2012-09-08 DIAGNOSIS — F329 Major depressive disorder, single episode, unspecified: Secondary | ICD-10-CM | POA: Diagnosis not present

## 2012-09-08 DIAGNOSIS — I10 Essential (primary) hypertension: Secondary | ICD-10-CM | POA: Diagnosis not present

## 2012-09-09 DIAGNOSIS — M159 Polyosteoarthritis, unspecified: Secondary | ICD-10-CM | POA: Diagnosis not present

## 2012-09-09 DIAGNOSIS — Z5189 Encounter for other specified aftercare: Secondary | ICD-10-CM | POA: Diagnosis not present

## 2012-09-09 DIAGNOSIS — F329 Major depressive disorder, single episode, unspecified: Secondary | ICD-10-CM | POA: Diagnosis not present

## 2012-09-09 DIAGNOSIS — E119 Type 2 diabetes mellitus without complications: Secondary | ICD-10-CM | POA: Diagnosis not present

## 2012-09-09 DIAGNOSIS — G20 Parkinson's disease: Secondary | ICD-10-CM | POA: Diagnosis not present

## 2012-09-09 DIAGNOSIS — I10 Essential (primary) hypertension: Secondary | ICD-10-CM | POA: Diagnosis not present

## 2012-09-10 DIAGNOSIS — E119 Type 2 diabetes mellitus without complications: Secondary | ICD-10-CM | POA: Diagnosis not present

## 2012-09-10 DIAGNOSIS — I10 Essential (primary) hypertension: Secondary | ICD-10-CM | POA: Diagnosis not present

## 2012-09-10 DIAGNOSIS — M159 Polyosteoarthritis, unspecified: Secondary | ICD-10-CM | POA: Diagnosis not present

## 2012-09-10 DIAGNOSIS — Z5189 Encounter for other specified aftercare: Secondary | ICD-10-CM | POA: Diagnosis not present

## 2012-09-10 DIAGNOSIS — F329 Major depressive disorder, single episode, unspecified: Secondary | ICD-10-CM | POA: Diagnosis not present

## 2012-09-10 DIAGNOSIS — G20 Parkinson's disease: Secondary | ICD-10-CM | POA: Diagnosis not present

## 2012-09-13 DIAGNOSIS — E119 Type 2 diabetes mellitus without complications: Secondary | ICD-10-CM | POA: Diagnosis not present

## 2012-09-13 DIAGNOSIS — I10 Essential (primary) hypertension: Secondary | ICD-10-CM | POA: Diagnosis not present

## 2012-09-13 DIAGNOSIS — F329 Major depressive disorder, single episode, unspecified: Secondary | ICD-10-CM | POA: Diagnosis not present

## 2012-09-13 DIAGNOSIS — G20 Parkinson's disease: Secondary | ICD-10-CM | POA: Diagnosis not present

## 2012-09-13 DIAGNOSIS — M159 Polyosteoarthritis, unspecified: Secondary | ICD-10-CM | POA: Diagnosis not present

## 2012-09-13 DIAGNOSIS — Z5189 Encounter for other specified aftercare: Secondary | ICD-10-CM | POA: Diagnosis not present

## 2012-09-15 DIAGNOSIS — Z5189 Encounter for other specified aftercare: Secondary | ICD-10-CM | POA: Diagnosis not present

## 2012-09-15 DIAGNOSIS — E119 Type 2 diabetes mellitus without complications: Secondary | ICD-10-CM | POA: Diagnosis not present

## 2012-09-15 DIAGNOSIS — G20 Parkinson's disease: Secondary | ICD-10-CM | POA: Diagnosis not present

## 2012-09-15 DIAGNOSIS — I10 Essential (primary) hypertension: Secondary | ICD-10-CM | POA: Diagnosis not present

## 2012-09-15 DIAGNOSIS — F329 Major depressive disorder, single episode, unspecified: Secondary | ICD-10-CM | POA: Diagnosis not present

## 2012-09-15 DIAGNOSIS — M159 Polyosteoarthritis, unspecified: Secondary | ICD-10-CM | POA: Diagnosis not present

## 2012-09-16 DIAGNOSIS — E119 Type 2 diabetes mellitus without complications: Secondary | ICD-10-CM | POA: Diagnosis not present

## 2012-09-16 DIAGNOSIS — G20 Parkinson's disease: Secondary | ICD-10-CM | POA: Diagnosis not present

## 2012-09-16 DIAGNOSIS — F329 Major depressive disorder, single episode, unspecified: Secondary | ICD-10-CM | POA: Diagnosis not present

## 2012-09-16 DIAGNOSIS — M159 Polyosteoarthritis, unspecified: Secondary | ICD-10-CM | POA: Diagnosis not present

## 2012-09-16 DIAGNOSIS — I10 Essential (primary) hypertension: Secondary | ICD-10-CM | POA: Diagnosis not present

## 2012-09-16 DIAGNOSIS — Z5189 Encounter for other specified aftercare: Secondary | ICD-10-CM | POA: Diagnosis not present

## 2012-09-17 DIAGNOSIS — M159 Polyosteoarthritis, unspecified: Secondary | ICD-10-CM | POA: Diagnosis not present

## 2012-09-17 DIAGNOSIS — G20 Parkinson's disease: Secondary | ICD-10-CM | POA: Diagnosis not present

## 2012-09-17 DIAGNOSIS — Z5189 Encounter for other specified aftercare: Secondary | ICD-10-CM | POA: Diagnosis not present

## 2012-09-17 DIAGNOSIS — E119 Type 2 diabetes mellitus without complications: Secondary | ICD-10-CM | POA: Diagnosis not present

## 2012-09-17 DIAGNOSIS — F329 Major depressive disorder, single episode, unspecified: Secondary | ICD-10-CM | POA: Diagnosis not present

## 2012-09-17 DIAGNOSIS — I10 Essential (primary) hypertension: Secondary | ICD-10-CM | POA: Diagnosis not present

## 2012-09-21 ENCOUNTER — Ambulatory Visit (HOSPITAL_COMMUNITY): Payer: Medicare Other

## 2012-09-21 DIAGNOSIS — F329 Major depressive disorder, single episode, unspecified: Secondary | ICD-10-CM | POA: Diagnosis not present

## 2012-09-21 DIAGNOSIS — M159 Polyosteoarthritis, unspecified: Secondary | ICD-10-CM | POA: Diagnosis not present

## 2012-09-21 DIAGNOSIS — I10 Essential (primary) hypertension: Secondary | ICD-10-CM | POA: Diagnosis not present

## 2012-09-21 DIAGNOSIS — G20 Parkinson's disease: Secondary | ICD-10-CM | POA: Diagnosis not present

## 2012-09-21 DIAGNOSIS — E119 Type 2 diabetes mellitus without complications: Secondary | ICD-10-CM | POA: Diagnosis not present

## 2012-09-21 DIAGNOSIS — Z5189 Encounter for other specified aftercare: Secondary | ICD-10-CM | POA: Diagnosis not present

## 2012-09-22 DIAGNOSIS — Z5189 Encounter for other specified aftercare: Secondary | ICD-10-CM | POA: Diagnosis not present

## 2012-09-22 DIAGNOSIS — G20 Parkinson's disease: Secondary | ICD-10-CM | POA: Diagnosis not present

## 2012-09-22 DIAGNOSIS — F329 Major depressive disorder, single episode, unspecified: Secondary | ICD-10-CM | POA: Diagnosis not present

## 2012-09-22 DIAGNOSIS — I10 Essential (primary) hypertension: Secondary | ICD-10-CM | POA: Diagnosis not present

## 2012-09-22 DIAGNOSIS — M159 Polyosteoarthritis, unspecified: Secondary | ICD-10-CM | POA: Diagnosis not present

## 2012-09-22 DIAGNOSIS — E119 Type 2 diabetes mellitus without complications: Secondary | ICD-10-CM | POA: Diagnosis not present

## 2012-09-23 DIAGNOSIS — Z5189 Encounter for other specified aftercare: Secondary | ICD-10-CM | POA: Diagnosis not present

## 2012-09-23 DIAGNOSIS — F329 Major depressive disorder, single episode, unspecified: Secondary | ICD-10-CM | POA: Diagnosis not present

## 2012-09-23 DIAGNOSIS — G20 Parkinson's disease: Secondary | ICD-10-CM | POA: Diagnosis not present

## 2012-09-23 DIAGNOSIS — I10 Essential (primary) hypertension: Secondary | ICD-10-CM | POA: Diagnosis not present

## 2012-09-23 DIAGNOSIS — E119 Type 2 diabetes mellitus without complications: Secondary | ICD-10-CM | POA: Diagnosis not present

## 2012-09-23 DIAGNOSIS — M159 Polyosteoarthritis, unspecified: Secondary | ICD-10-CM | POA: Diagnosis not present

## 2012-10-12 DIAGNOSIS — E119 Type 2 diabetes mellitus without complications: Secondary | ICD-10-CM | POA: Diagnosis not present

## 2012-10-12 DIAGNOSIS — G20 Parkinson's disease: Secondary | ICD-10-CM | POA: Diagnosis not present

## 2012-10-12 DIAGNOSIS — I10 Essential (primary) hypertension: Secondary | ICD-10-CM | POA: Diagnosis not present

## 2012-10-19 ENCOUNTER — Ambulatory Visit (HOSPITAL_COMMUNITY)
Admission: RE | Admit: 2012-10-19 | Discharge: 2012-10-19 | Disposition: A | Discharge status: Home / Self Care | Payer: Medicare Other | Source: Ambulatory Visit | Attending: "Endocrinology | Admitting: "Endocrinology

## 2012-10-19 DIAGNOSIS — E049 Nontoxic goiter, unspecified: Secondary | ICD-10-CM

## 2012-10-19 DIAGNOSIS — E042 Nontoxic multinodular goiter: Secondary | ICD-10-CM | POA: Insufficient documentation

## 2012-10-21 ENCOUNTER — Encounter: Payer: Self-pay | Admitting: Neurology

## 2012-10-21 ENCOUNTER — Ambulatory Visit (INDEPENDENT_AMBULATORY_CARE_PROVIDER_SITE_OTHER): Payer: Medicare Other | Admitting: Neurology

## 2012-10-21 VITALS — BP 145/72 | HR 69 | Ht 62.0 in | Wt 227.0 lb

## 2012-10-21 DIAGNOSIS — M25569 Pain in unspecified knee: Secondary | ICD-10-CM

## 2012-10-21 DIAGNOSIS — Z8679 Personal history of other diseases of the circulatory system: Secondary | ICD-10-CM

## 2012-10-21 DIAGNOSIS — G20 Parkinson's disease: Secondary | ICD-10-CM

## 2012-10-21 DIAGNOSIS — M19019 Primary osteoarthritis, unspecified shoulder: Secondary | ICD-10-CM

## 2012-10-21 DIAGNOSIS — M25519 Pain in unspecified shoulder: Secondary | ICD-10-CM

## 2012-10-21 DIAGNOSIS — G40909 Epilepsy, unspecified, not intractable, without status epilepticus: Secondary | ICD-10-CM

## 2012-10-21 DIAGNOSIS — M25512 Pain in left shoulder: Secondary | ICD-10-CM

## 2012-10-21 NOTE — Progress Notes (Signed)
[facility]  PATIENT: Tiffany Hartman DOB: 31-Aug-1927  HISTORICAL Tiffany Hartman is a 79 years old right-handed African American female, referred by her neurologist Dr. Casandra Doffing, accompanied by her granddaughter for second opinion of tremor, rule out Parkinson's disease  She had a past medical history of hypertension, diabetes, hyperlipidemia, migraine, depression, anxiety, and history of left breast cancer, status post lobectomy in 1985, bilateral knee replacement, right femoral fracture  She also has a strong family history of Huntington's disease, her mother died of Huntington's disease at age 36, she was disabled,  could not walk, her mind was completely gone, she also had 3 other siblings, died of Huntington's disease, her older brother died at age 40 from coronary artery disease, she is the only surviving child of her generation.  She has been tested at [facility], she does not carry Huntington's disease gene,  herself has 6 children, none of them show signs of Huntington's.  Her child, and her grandchild has been genetically tested as well  Sings 2011, she noticed jaw tremor, later she also developed voice tremor, also gradual onset bilateral hands resting tremor, she was followed by local neurologist Dr. Lemont Fillers, was told that she has possibility of Parkinson's disease, her symptoms has gradually progressed over the past 3 years, she now has gait difficulty mainly due to bilateral knee pain, right leg pain from her previous femoral fracture,  She denies swallowing difficulties, no REM sleep disorder, no loss sense of smell, no constipation.  REVIEW OF SYSTEMS: Full 14 system review of systems performed and notable only for  shortness of breath, cough, wheezing, feeling cold, joint pain, energy, runny nose, memory loss, headaches, weakness, slurred speech, dizziness, tremor, snoring, depression, anxiety, decreased energy, change in appetite, disinterested in  activities   ALLERGIES: No Known Allergies  HOME MEDICATIONS: Outpatient Prescriptions Prior to Visit  Medication Sig Dispense Refill  . albuterol (PROVENTIL HFA;VENTOLIN HFA) 108 (90 BASE) MCG/ACT inhaler Inhale 1-2 puffs into the lungs every 6 (six) hours as needed for wheezing.  1 Inhaler  0  . albuterol (PROVENTIL) (5 MG/ML) 0.5% nebulizer solution Take 0.5 mLs (2.5 mg total) by nebulization every 4 (four) hours as needed for wheezing or shortness of breath.  20 mL  12  . ALPRAZolam (XANAX) 0.25 MG tablet Take 0.25 mg by mouth 3 (three) times daily as needed for sleep or anxiety.      Marland Kitchen amLODipine (NORVASC) 5 MG tablet Take 5 mg by mouth daily.      Marland Kitchen aspirin EC 81 MG tablet Take 81 mg by mouth daily.      . Calcium Carbonate-Vitamin D (CALCIUM + D) 600-200 MG-UNIT TABS Take 1 tablet by mouth every evening.       . clopidogrel (PLAVIX) 75 MG tablet Take 1 tablet (75 mg total) by mouth daily with breakfast.  30 tablet  3  . donepezil (ARICEPT) 5 MG tablet Take 5 mg by mouth daily.      . ferrous sulfate 325 (65 FE) MG tablet Take 325 mg by mouth daily.      Marland Kitchen FLUoxetine (PROZAC) 20 MG capsule Take 20 mg by mouth daily.      . folic acid (FOLVITE) 1 MG tablet Take 1 mg by mouth daily.      . furosemide (LASIX) 20 MG tablet Take 20 mg by mouth daily.      Marland Kitchen gabapentin (NEURONTIN) 300 MG capsule Take 1 capsule (300 mg total) by mouth 3 (three)  times daily.  30 capsule  1  . HYDROcodone-acetaminophen (NORCO/VICODIN) 5-325 MG per tablet Take 1 tablet by mouth every 4 (four) hours as needed for pain.  60 tablet  1  . ipratropium (ATROVENT) 0.02 % nebulizer solution Take 2.5 mLs (0.5 mg total) by nebulization 3 (three) times daily.  75 mL  12  . latanoprost (XALATAN) 0.005 % ophthalmic solution Place 1 drop into both eyes at bedtime.      . levETIRAcetam (KEPPRA) 250 MG tablet Take 250 mg by mouth 2 (two) times daily.      Marland Kitchen lovastatin (MEVACOR) 10 MG tablet Take 10 mg by mouth at bedtime.       . meclizine (ANTIVERT) 25 MG tablet Take 25 mg by mouth every 6 (six) hours as needed. dizziness      . methylPREDNISolone (MEDROL, PAK,) 4 MG tablet follow package directions  21 tablet  0  . olmesartan (BENICAR) 40 MG tablet Take 40 mg by mouth daily.      Marland Kitchen omeprazole (PRILOSEC) 20 MG capsule Take 20 mg by mouth daily.      . potassium chloride SA (K-DUR,KLOR-CON) 20 MEQ tablet Take 0.5 tablets (10 mEq total) by mouth daily.  30 tablet  3  . amoxicillin-clavulanate (AUGMENTIN) 500-125 MG per tablet Take 1 tablet (500 mg total) by mouth 3 (three) times daily.  15 tablet  0   No facility-administered medications prior to visit.    PAST MEDICAL HISTORY: Past Medical History  Diagnosis Date  . Dementia   . Bronchitis   . Asthma   . Diabetes mellitus   . Parkinson disease   . HTN (hypertension)   . OA (osteoarthritis)   . Thyroid nodule     Dr [name]  . Glaucoma   . Diverticulitis 2009  . Breast cancer 02/20/83    LT mastectomy    PAST SURGICAL HISTORY: Past Surgical History  Procedure Laterality Date  . Abdominal hysterectomy      complete  . Joint replacement  1998/1999    knee boths  . Mastectomy  02/20/1983    left   . Fracture surgery  2008     right and left femurs  . Appendectomy    .  bilateral catracts    . Eus  09/10/2011    Procedure: UPPER ENDOSCOPIC ULTRASOUND (EUS) RADIAL;  Surgeon: Willis Modena, MD;  Location: [facility] END[facility]COPY;  Service: Endoscopy;  Laterality: N/A;  christina/ebp  . Fine needle aspiration  09/10/2011    Procedure: FINE NEEDLE ASPIRATION (FNA) LINEAR;  Surgeon: Willis Modena, MD;  Location: [facility]OSCOPY;  Service: Endoscopy;  Laterality: N/A;  . Knee surgery Right     TKA  . Knee surgery Left     TKA  . Knee surgery Right     Periprosthetic fracture Donata Clay    FAMILY HISTORY: Family History  Problem Relation Age of Onset  . Colon polyps Daughter 58  . Stroke Father   . Huntington's disease Mother   . Throat cancer Son     SOCIAL  HISTORY:  History   Social History  . Marital Status: Widowed    Spouse Name: N/A    Number of Children: 6  . Years of Education: N/A   Occupational History  . retired, CNA [employer]    Social History Main Topics  . Smoking status: Never Smoker   . Smokeless tobacco: Never Used  . Alcohol Use: No  . Drug Use: No  . Sexual Activity: No  Other Topics Concern  . Not on file   Social History Narrative   Lives w/ daughter   PHYSICAL Forestine Chute Vitals:   10/21/12 1107  BP: 145/72  Pulse: 69  Height: 5\' 2"  (1.575 m)  Weight: 227 lb (102.967 kg)   Body mass index is 41.51 kg/(m^2).   Generalized: In no acute distress  Neck: Supple, no carotid bruits   Cardiac: Regular rate rhythm  Pulmonary: Clear to auscultation bilaterally  Musculoskeletal: No deformity  Neurological examination  Mentation: Alert oriented to time, place, history taking, and causual conversation, tremorish voice  Cranial nerve II-XII: Pupils were equal round reactive to light extraocular movements were full, visual field were full on confrontational test. facial sensation and strength were normal. hearing was intact to finger rubbing bilaterally. Uvula tongue midline.  head turning and shoulder shrug and were normal and symmetric.Tongue protrusion into cheek strength was normal.  Motor: She has bilateral hand resting tremor, mild jaw tremor, limited range of motion of left shoulder, otherwise, no significant weakness, no bradykinesia, She has no rigidity.  Sensory: Intact to fine touch, pinprick, preserved vibratory sensation, and proprioception at toes.  Coordination: Normal finger to nose, heel-to-shin bilaterally there was no truncal ataxia  Gait: she needs to push up from seated position, dragging her left leg, cautious, unsteady Romberg signs: Negative  Deep tendon reflexes: Brachioradialis 2/2, biceps 2/2, triceps 2/2, patellar 0/0, Achilles trace, plantar responses were flexor  bilaterally.   DIAGNOSTIC DATA (LABS, IMAGING, TESTING) - I reviewed patient records, labs, notes, testing and imaging myself where available.  Lab Results  Component Value Date   WBC 7.9 08/11/2012   HGB 11.6* 08/11/2012   HCT 37.6 08/11/2012   MCV 91.7 08/11/2012   PLT 333 08/11/2012      Component Value Date/Time   NA 140 08/11/2012 0519   NA 145 08/21/2011 1442   K 3.8 08/11/2012 0519   K 4.6 08/21/2011 1442   CL 105 08/11/2012 0519   CL 109 08/21/2011 1442   CO2 26 08/11/2012 0519   GLUCOSE 106* 08/11/2012 0519   BUN 10 08/11/2012 0519   BUN 17 08/21/2011 1442   CREATININE 0.99 08/11/2012 0519   CREATININE 1.12 08/21/2011 1442   CALCIUM 8.7 08/11/2012 0519   CALCIUM 9.3 08/21/2011 1442   PROT 7.0 08/10/2012 1838   PROT 6.9 08/21/2011 1442   ALBUMIN 3.6 08/10/2012 1838   AST 13 08/10/2012 1838   AST 13 08/21/2011 1442   ALT 10 08/10/2012 1838   ALKPHOS 65 08/10/2012 1838   ALKPHOS 62 08/21/2011 1442   BILITOT 0.3 08/10/2012 1838   BILITOT 0.4 08/21/2011 1442   GFRNONAA 51* 08/11/2012 0519   GFRAA 59* 08/11/2012 0519   Lab Results  Component Value Date   CHOL 163 08/11/2012   HDL 45 08/11/2012   LDLCALC 91 08/11/2012   TRIG 135 08/11/2012   CHOLHDL 3.6 08/11/2012   Lab Results  Component Value Date   HGBA1C 6.5* 08/11/2012   Lab Results  Component Value Date   VITAMINB12 358 05/16/2009    ASSESSMENT AND PLAN   79 years old [place] American female, with 3 years history of bilateral hand shaking,  Jaw tremor, head titubation, mild bilateral hand resting tremor, there was no rigidity, bradykinesia, her gait difficulty are likely a combination of obesity, deconditioning, bilateral knee pain, right femoral fracture, extensive small vessel disease, her findings are not supportive of diagnosis of Parkinson's disease   1. proceed with physical therapy  water aerobics 2. return to clinic in 3 months 3. continue Sinemet 25/100 mg 3 times a day.   Levert Feinstein, M.D. Ph.D.  [facility] [address],  [address] [place], [place] [address] [phone]

## 2012-10-25 DIAGNOSIS — E669 Obesity, unspecified: Secondary | ICD-10-CM | POA: Diagnosis not present

## 2012-10-25 DIAGNOSIS — E119 Type 2 diabetes mellitus without complications: Secondary | ICD-10-CM | POA: Diagnosis not present

## 2012-10-25 DIAGNOSIS — E042 Nontoxic multinodular goiter: Secondary | ICD-10-CM | POA: Diagnosis not present

## 2012-10-25 DIAGNOSIS — E785 Hyperlipidemia, unspecified: Secondary | ICD-10-CM | POA: Diagnosis not present

## 2012-12-01 ENCOUNTER — Other Ambulatory Visit (HOSPITAL_COMMUNITY): Payer: Self-pay | Admitting: Internal Medicine

## 2012-12-01 DIAGNOSIS — Z139 Encounter for screening, unspecified: Secondary | ICD-10-CM

## 2012-12-13 ENCOUNTER — Ambulatory Visit (HOSPITAL_COMMUNITY)
Admission: RE | Admit: 2012-12-13 | Discharge: 2012-12-13 | Disposition: A | Discharge status: Home / Self Care | Payer: Medicare Other | Source: Ambulatory Visit | Attending: Internal Medicine | Admitting: Internal Medicine

## 2012-12-13 DIAGNOSIS — Z1231 Encounter for screening mammogram for malignant neoplasm of breast: Secondary | ICD-10-CM | POA: Insufficient documentation

## 2012-12-13 DIAGNOSIS — Z139 Encounter for screening, unspecified: Secondary | ICD-10-CM

## 2012-12-28 DIAGNOSIS — H43819 Vitreous degeneration, unspecified eye: Secondary | ICD-10-CM | POA: Diagnosis not present

## 2012-12-28 DIAGNOSIS — H4011X0 Primary open-angle glaucoma, stage unspecified: Secondary | ICD-10-CM | POA: Diagnosis not present

## 2012-12-28 DIAGNOSIS — H35039 Hypertensive retinopathy, unspecified eye: Secondary | ICD-10-CM | POA: Diagnosis not present

## 2012-12-28 DIAGNOSIS — H409 Unspecified glaucoma: Secondary | ICD-10-CM | POA: Diagnosis not present

## 2013-01-17 ENCOUNTER — Ambulatory Visit: Payer: Medicare Other | Admitting: Neurology

## 2013-01-25 DIAGNOSIS — Z0000 Encounter for general adult medical examination without abnormal findings: Secondary | ICD-10-CM | POA: Diagnosis not present

## 2013-01-25 DIAGNOSIS — E039 Hypothyroidism, unspecified: Secondary | ICD-10-CM | POA: Diagnosis not present

## 2013-01-25 DIAGNOSIS — I10 Essential (primary) hypertension: Secondary | ICD-10-CM | POA: Diagnosis not present

## 2013-01-25 DIAGNOSIS — E119 Type 2 diabetes mellitus without complications: Secondary | ICD-10-CM | POA: Diagnosis not present

## 2013-01-25 DIAGNOSIS — E7800 Pure hypercholesterolemia, unspecified: Secondary | ICD-10-CM | POA: Diagnosis not present

## 2013-02-08 ENCOUNTER — Ambulatory Visit (INDEPENDENT_AMBULATORY_CARE_PROVIDER_SITE_OTHER): Payer: Medicare Other | Admitting: Neurology

## 2013-02-08 ENCOUNTER — Encounter (INDEPENDENT_AMBULATORY_CARE_PROVIDER_SITE_OTHER): Payer: Self-pay

## 2013-02-08 ENCOUNTER — Encounter: Payer: Self-pay | Admitting: Neurology

## 2013-02-08 VITALS — BP 175/75 | HR 80 | Ht 62.0 in | Wt 222.0 lb

## 2013-02-08 DIAGNOSIS — G8190 Hemiplegia, unspecified affecting unspecified side: Secondary | ICD-10-CM

## 2013-02-08 DIAGNOSIS — Z8673 Personal history of transient ischemic attack (TIA), and cerebral infarction without residual deficits: Secondary | ICD-10-CM

## 2013-02-08 DIAGNOSIS — G8194 Hemiplegia, unspecified affecting left nondominant side: Secondary | ICD-10-CM

## 2013-02-08 NOTE — Progress Notes (Signed)
[facility]  PATIENT: Tiffany Hartman DOB: 18-Aug-1927  HISTORICAL INITIAL VISIT in OCT 2014: Tiffany Hartman is a 80 years old right-handed African American female, referred by her neurologist Dr. Casandra Hartman, accompanied by her granddaughter for second opinion of tremor, rule out Parkinson's disease  She had a past medical history of hypertension, diabetes, hyperlipidemia, migraine, depression, anxiety, and history of left breast cancer, status post lobectomy in 1985, bilateral knee replacement, right femoral fracture  She also has a strong family history of Huntington's disease, her mother died of Huntington's disease at age 41, she was disabled,  could not walk, her mind was completely gone, she also had 3 other siblings, died of Huntington's disease, her older brother died at age 79 from coronary artery disease, she is the only surviving child of her generation.  She has been tested at [facility], she does not carry Huntington's disease gene,  herself has 6 children, none of them show signs of Huntington's.  Her children, and her grandchild has been genetically tested as well, they were all negative.  Since 2011, she noticed jaw tremor, later she also developed voice tremor, also gradual onset bilateral hands resting tremor, she was followed by local neurologist Dr. Lemont Hartman, who raised the possibility of Parkinson's disease, her symptoms has gradually progressed over the past 3 years, she now has gait difficulty mainly due to bilateral knee pain, right leg pain from her previous femoral fracture,  She denies swallowing difficulties, no REM sleep disorder, no loss sense of smell, no constipation.  We have reviewed MRI of the brain in August 2014, extensive periventricular white matter disease, also involving ventral pontine, MRA of brain.Intracranial atherosclerotic type changes    UPDATE Feb 3rd 2015: Since her last visit, her son died from cancer in 2015-02-11she had water  aerobic, which has been very helpful, she continues to have tremulous voice. She was given Sinemet 25/100 tid, she complains of nause, no help, she is no longer taking it.   REVIEW OF SYSTEMS: Full 14 system review of systems performed and notable only for neck pain, wheezing, abdominal pain, diarrhea, snoring, headache, speech difficulty, nervousness  ALLERGIES: No Known Allergies  HOME MEDICATIONS: Outpatient Prescriptions Prior to Visit  Medication Sig Dispense Refill  . albuterol (PROVENTIL) (5 MG/ML) 0.5% nebulizer solution Take 0.5 mLs (2.5 mg total) by nebulization every 4 (four) hours as needed for wheezing or shortness of breath.  20 mL  12  . ALPRAZolam (XANAX) 0.25 MG tablet Take 0.25 mg by mouth 3 (three) times daily as needed for sleep or anxiety.      Marland Kitchen amLODipine (NORVASC) 5 MG tablet Take 5 mg by mouth daily.      . baclofen (LIORESAL) 10 MG tablet Take 10 mg by mouth 3 (three) times daily.      . benazepril-hydrochlorthiazide (LOTENSIN HCT) 20-12.5 MG per tablet Take 1 tablet by mouth daily.      . Calcium Carbonate-Vitamin D (CALCIUM + D) 600-200 MG-UNIT TABS Take 1 tablet by mouth every evening.       . carbidopa-levodopa (PARCOPA) 10-100 MG per disintegrating tablet Take 1 tablet by mouth 3 (three) times daily.      . clopidogrel (PLAVIX) 75 MG tablet Take 1 tablet (75 mg total) by mouth daily with breakfast.  30 tablet  3  . donepezil (ARICEPT) 5 MG tablet Take 5 mg by mouth daily.      Marland Kitchen escitalopram (LEXAPRO) 10 MG tablet Take 10 mg  by mouth daily.      . ferrous gluconate (FERGON) 325 MG tablet Take 325 mg by mouth daily with breakfast.      . ferrous sulfate 325 (65 FE) MG tablet Take 325 mg by mouth daily.      Marland Kitchen FLUoxetine (PROZAC) 20 MG capsule Take 20 mg by mouth daily.      . folic acid (FOLVITE) 1 MG tablet Take 1 mg by mouth daily.      . furosemide (LASIX) 20 MG tablet Take 20 mg by mouth daily.      Marland Kitchen gabapentin (NEURONTIN) 300 MG capsule Take 1 capsule  (300 mg total) by mouth 3 (three) times daily.  30 capsule  1  . HYDROcodone-acetaminophen (NORCO/VICODIN) 5-325 MG per tablet Take 1 tablet by mouth every 4 (four) hours as needed for pain.  60 tablet  1  . ipratropium (ATROVENT) 0.02 % nebulizer solution Take 2.5 mLs (0.5 mg total) by nebulization 3 (three) times daily.  75 mL  12  . latanoprost (XALATAN) 0.005 % ophthalmic solution Place 1 drop into both eyes at bedtime.      . levETIRAcetam (KEPPRA) 250 MG tablet Take 250 mg by mouth 2 (two) times daily.      Marland Kitchen losartan (COZAAR) 100 MG tablet Take 100 mg by mouth daily.      Marland Kitchen lovastatin (MEVACOR) 10 MG tablet Take 10 mg by mouth at bedtime.      . meclizine (ANTIVERT) 25 MG tablet Take 25 mg by mouth every 6 (six) hours as needed. dizziness      . methylPREDNISolone (MEDROL, PAK,) 4 MG tablet follow package directions  21 tablet  0  . olmesartan (BENICAR) 40 MG tablet Take 40 mg by mouth daily.      Marland Kitchen omeprazole (PRILOSEC) 20 MG capsule Take 20 mg by mouth daily.      . Potassium Chloride Crys CR (KLOR-CON M10 PO) Take by mouth daily.      Marland Kitchen aspirin EC 81 MG tablet Take 81 mg by mouth daily.      Marland Kitchen albuterol (PROVENTIL HFA;VENTOLIN HFA) 108 (90 BASE) MCG/ACT inhaler Inhale 1-2 puffs into the lungs every 6 (six) hours as needed for wheezing.  1 Inhaler  0  . potassium chloride SA (K-DUR,KLOR-CON) 20 MEQ tablet Take 0.5 tablets (10 mEq total) by mouth daily.  30 tablet  3   No facility-administered medications prior to visit.    PAST MEDICAL HISTORY: Past Medical History  Diagnosis Date  . Dementia   . Bronchitis   . Asthma   . Diabetes mellitus   . Parkinson disease   . HTN (hypertension)   . OA (osteoarthritis)   . Thyroid nodule     Dr Legrand Rams  . Glaucoma   . Diverticulitis 2009  . Breast cancer 02/20/83    LT mastectomy    PAST SURGICAL HISTORY: Past Surgical History  Procedure Laterality Date  . Abdominal hysterectomy      complete  . Joint replacement  1998/1999     knee boths  . Mastectomy  02/20/1983    left   . Fracture surgery  2008     right and left femurs  . Appendectomy    .  bilateral catracts    . Eus  09/10/2011    Procedure: UPPER ENDOSCOPIC ULTRASOUND (EUS) RADIAL;  Surgeon: Arta Silence, MD;  Location: [facility];  Service: Endoscopy;  Laterality: N/A;  christina/ebp  . Fine needle aspiration  09/10/2011  Procedure: FINE NEEDLE ASPIRATION (FNA) LINEAR;  Surgeon: Arta Silence, MD;  Location: [facility];  Service: Endoscopy;  Laterality: N/A;  . Knee surgery Right     TKA  . Knee surgery Left     TKA  . Knee surgery Right     Periprosthetic fracture Linna Caprice    FAMILY HISTORY: Family History  Problem Relation Age of Onset  . Colon polyps Daughter 69  . Stroke Father   . Huntington's disease Mother   . Throat cancer Son     SOCIAL HISTORY:  History   Social History  . Marital Status: Widowed    Spouse Name: N/A    Number of Children: 6  . Years of Education: N/A   Occupational History  . retired, CNA [employer]    Social History Main Topics  . Smoking status: Never Smoker   . Smokeless tobacco: Never Used  . Alcohol Use: No  . Drug Use: No  . Sexual Activity: No   Other Topics Concern  . Not on file   Social History Narrative   Lives w/ daughter   PHYSICAL Cleotilde Neer Vitals:   02/08/13 1046  BP: 175/75  Pulse: 80  Height: 5\' 2"  (1.575 m)  Weight: 222 lb (100.699 kg)   Body mass index is 40.59 kg/(m^2).   Generalized: In no acute distress  Neck: Supple, no carotid bruits   Cardiac: Regular rate rhythm  Pulmonary: Clear to auscultation bilaterally  Musculoskeletal: No deformity  Neurological examination  Mentation: Alert oriented to time, place, history taking, and causual conversation, tremorish voice  Cranial nerve II-XII: Pupils were equal round reactive to light extraocular movements were full, visual field were full on confrontational test. facial sensation and strength were normal.  hearing was intact to finger rubbing bilaterally. Uvula tongue midline.  head turning and shoulder shrug and were normal and symmetric.Tongue protrusion into cheek strength was normal.  Motor: She has bilateral hand resting tremor, mild jaw tremor, limited range of motion of left shoulder, otherwise, no significant weakness, no bradykinesia, She has no rigidity.  Sensory: Intact to fine touch, pinprick, preserved vibratory sensation, and proprioception at toes.  Coordination: Normal finger to nose, heel-to-shin bilaterally there was no truncal ataxia  Gait: she needs to push up from seated position, dragging her left leg, cautious, unsteady Romberg signs: Negative  Deep tendon reflexes: Brachioradialis 2/2, biceps 2/2, triceps 2/2, patellar 0/0, Achilles trace, plantar responses were flexor bilaterally.   DIAGNOSTIC DATA (LABS, IMAGING, TESTING) - I reviewed patient records, labs, notes, testing and imaging myself where available.  Lab Results  Component Value Date   WBC 7.9 08/11/2012   HGB 11.6* 08/11/2012   HCT 37.6 08/11/2012   MCV 91.7 08/11/2012   PLT 333 08/11/2012      Component Value Date/Time   NA 140 08/11/2012 0519   NA 145 08/21/2011 1442   K 3.8 08/11/2012 0519   K 4.6 08/21/2011 1442   CL 105 08/11/2012 0519   CL 109 08/21/2011 1442   CO2 26 08/11/2012 0519   GLUCOSE 106* 08/11/2012 0519   BUN 10 08/11/2012 0519   BUN 17 08/21/2011 1442   CREATININE 0.99 08/11/2012 0519   CREATININE 1.12 08/21/2011 1442   CALCIUM 8.7 08/11/2012 0519   CALCIUM 9.3 08/21/2011 1442   PROT 7.0 08/10/2012 1838   PROT 6.9 08/21/2011 1442   ALBUMIN 3.6 08/10/2012 1838   AST 13 08/10/2012 1838   AST 13 08/21/2011 1442   ALT 10 08/10/2012  1838   ALKPHOS 65 08/10/2012 1838   ALKPHOS 62 08/21/2011 1442   BILITOT 0.3 08/10/2012 1838   BILITOT 0.4 08/21/2011 1442   GFRNONAA 51* 08/11/2012 0519   GFRAA 59* 08/11/2012 0519   Lab Results  Component Value Date   CHOL 163 08/11/2012   HDL 45 08/11/2012   LDLCALC 91 08/11/2012   TRIG  135 08/11/2012   CHOLHDL 3.6 08/11/2012   Lab Results  Component Value Date   HGBA1C 6.5* 08/11/2012   Lab Results  Component Value Date   VITAMINB12 358 05/16/2009    ASSESSMENT AND PLAN   80 years old [place] American female, with 3 years history of bilateral hand shaking,  Jaw tremor, head titubation, mild bilateral hand resting and posture tremor, there was no rigidity, bradykinesia, her gait difficulty are likely a combination of obesity, deconditioning, bilateral knee pain, right femoral fracture, extensive small vessel disease, her findings do not supportive of diagnosis of Parkinson's disease  She is to continue with water aerobics, only return to clinic as needed   Marcial Pacas, M.D. Ph.D.  [facility] [address] [place], [place] [address] [phone]

## 2013-03-22 DIAGNOSIS — E042 Nontoxic multinodular goiter: Secondary | ICD-10-CM | POA: Diagnosis not present

## 2013-06-03 DIAGNOSIS — E119 Type 2 diabetes mellitus without complications: Secondary | ICD-10-CM | POA: Diagnosis not present

## 2013-06-03 DIAGNOSIS — I6789 Other cerebrovascular disease: Secondary | ICD-10-CM | POA: Diagnosis not present

## 2013-06-03 DIAGNOSIS — M1990 Unspecified osteoarthritis, unspecified site: Secondary | ICD-10-CM | POA: Diagnosis not present

## 2013-06-03 DIAGNOSIS — I10 Essential (primary) hypertension: Secondary | ICD-10-CM | POA: Diagnosis not present

## 2013-07-17 ENCOUNTER — Encounter (HOSPITAL_COMMUNITY): Payer: Self-pay | Admitting: Emergency Medicine

## 2013-07-17 ENCOUNTER — Emergency Department (HOSPITAL_COMMUNITY): Payer: Medicare Other

## 2013-07-17 ENCOUNTER — Emergency Department (HOSPITAL_COMMUNITY)
Admission: EM | Admit: 2013-07-17 | Discharge: 2013-07-17 | Disposition: A | Discharge status: Home / Self Care | Payer: Medicare Other | Attending: Emergency Medicine | Admitting: Emergency Medicine

## 2013-07-17 DIAGNOSIS — J45909 Unspecified asthma, uncomplicated: Secondary | ICD-10-CM | POA: Insufficient documentation

## 2013-07-17 DIAGNOSIS — H409 Unspecified glaucoma: Secondary | ICD-10-CM | POA: Insufficient documentation

## 2013-07-17 DIAGNOSIS — S79919A Unspecified injury of unspecified hip, initial encounter: Secondary | ICD-10-CM | POA: Insufficient documentation

## 2013-07-17 DIAGNOSIS — F0390 Unspecified dementia without behavioral disturbance: Secondary | ICD-10-CM | POA: Insufficient documentation

## 2013-07-17 DIAGNOSIS — Y9301 Activity, walking, marching and hiking: Secondary | ICD-10-CM | POA: Insufficient documentation

## 2013-07-17 DIAGNOSIS — E119 Type 2 diabetes mellitus without complications: Secondary | ICD-10-CM | POA: Insufficient documentation

## 2013-07-17 DIAGNOSIS — M169 Osteoarthritis of hip, unspecified: Secondary | ICD-10-CM | POA: Diagnosis not present

## 2013-07-17 DIAGNOSIS — M1990 Unspecified osteoarthritis, unspecified site: Secondary | ICD-10-CM | POA: Insufficient documentation

## 2013-07-17 DIAGNOSIS — S46909A Unspecified injury of unspecified muscle, fascia and tendon at shoulder and upper arm level, unspecified arm, initial encounter: Secondary | ICD-10-CM | POA: Diagnosis not present

## 2013-07-17 DIAGNOSIS — W010XXA Fall on same level from slipping, tripping and stumbling without subsequent striking against object, initial encounter: Secondary | ICD-10-CM | POA: Insufficient documentation

## 2013-07-17 DIAGNOSIS — I10 Essential (primary) hypertension: Secondary | ICD-10-CM | POA: Diagnosis not present

## 2013-07-17 DIAGNOSIS — S79929A Unspecified injury of unspecified thigh, initial encounter: Principal | ICD-10-CM

## 2013-07-17 DIAGNOSIS — Z853 Personal history of malignant neoplasm of breast: Secondary | ICD-10-CM | POA: Diagnosis not present

## 2013-07-17 DIAGNOSIS — S0993XA Unspecified injury of face, initial encounter: Secondary | ICD-10-CM | POA: Diagnosis not present

## 2013-07-17 DIAGNOSIS — Z8719 Personal history of other diseases of the digestive system: Secondary | ICD-10-CM | POA: Diagnosis not present

## 2013-07-17 DIAGNOSIS — M25552 Pain in left hip: Secondary | ICD-10-CM

## 2013-07-17 DIAGNOSIS — M25551 Pain in right hip: Secondary | ICD-10-CM

## 2013-07-17 DIAGNOSIS — M1610 Unilateral primary osteoarthritis, unspecified hip: Secondary | ICD-10-CM | POA: Diagnosis not present

## 2013-07-17 DIAGNOSIS — Z7902 Long term (current) use of antithrombotics/antiplatelets: Secondary | ICD-10-CM | POA: Insufficient documentation

## 2013-07-17 DIAGNOSIS — Z79899 Other long term (current) drug therapy: Secondary | ICD-10-CM | POA: Insufficient documentation

## 2013-07-17 DIAGNOSIS — R52 Pain, unspecified: Secondary | ICD-10-CM | POA: Diagnosis not present

## 2013-07-17 DIAGNOSIS — M25559 Pain in unspecified hip: Secondary | ICD-10-CM | POA: Diagnosis not present

## 2013-07-17 DIAGNOSIS — M25519 Pain in unspecified shoulder: Secondary | ICD-10-CM | POA: Diagnosis not present

## 2013-07-17 DIAGNOSIS — W19XXXA Unspecified fall, initial encounter: Secondary | ICD-10-CM

## 2013-07-17 DIAGNOSIS — S4980XA Other specified injuries of shoulder and upper arm, unspecified arm, initial encounter: Secondary | ICD-10-CM | POA: Diagnosis not present

## 2013-07-17 DIAGNOSIS — Y9289 Other specified places as the place of occurrence of the external cause: Secondary | ICD-10-CM | POA: Insufficient documentation

## 2013-07-17 MED ORDER — ACETAMINOPHEN 500 MG PO TABS: 1000.0000 mg | ORAL_TABLET | Freq: Once | ORAL | Status: AC

## 2013-07-17 MED ORDER — ACETAMINOPHEN 500 MG PO TABS
ORAL_TABLET | ORAL | Status: AC
  Administered 2013-07-17: 1000 mg via ORAL
  Filled 2013-07-17: qty 2

## 2013-07-17 NOTE — ED Notes (Signed)
Pt updated on plan of care, requesting something for headache,

## 2013-07-17 NOTE — ED Provider Notes (Signed)
CSN: [id number]     Arrival date & time 07/17/13  1054 History  This chart was scribed for Nat Christen, MD by Vernell Barrier, ED scribe. This patient was seen in room APA05/APA05 and the patient's care was started at 11:09 AM.   Chief Complaint  Patient presents with  . Fall   The history is provided by the patient. No language interpreter was used.   HPI Comments: Level V caveat for Parkinson's disease. Tiffany Hartman is a 80 y.o. female wh/ hx of vertigo presents to the Emergency Department BIBA for fall; no LOC. States she was walking out to the trash, reports 1 episode of vertigo, and then tripped over a flower pot and hit the ground. States she was unable to move after she fell. Was on the ground outside until EMS arrived. Presents with pain to bilateral hips (worse on the left), bilateral shoulders (worse on the left), left chin, and left forehead. Regularly has vertigo spells. Hx of bilateral knee replacement performed by Dr. Aline Brochure in [place].   Past Medical History  Diagnosis Date  . Dementia   . Bronchitis   . Asthma   . Diabetes mellitus   . Parkinson disease   . HTN (hypertension)   . OA (osteoarthritis)   . Thyroid nodule     Dr Legrand Rams  . Glaucoma   . Diverticulitis 2009  . Breast cancer 02/20/83    LT mastectomy   Past Surgical History  Procedure Laterality Date  . Abdominal hysterectomy      complete  . Joint replacement  1998/1999    knee boths  . Mastectomy  02/20/1983    left   . Fracture surgery  2008     right and left femurs  . Appendectomy    .  bilateral catracts    . Eus  09/10/2011    Procedure: UPPER ENDOSCOPIC ULTRASOUND (EUS) RADIAL;  Surgeon: Arta Silence, MD;  Location: [facility];  Service: Endoscopy;  Laterality: N/A;  christina/ebp  . Fine needle aspiration  09/10/2011    Procedure: FINE NEEDLE ASPIRATION (FNA) LINEAR;  Surgeon: Arta Silence, MD;  Location: [facility];  Service: Endoscopy;  Laterality: N/A;  . Knee surgery Right      TKA  . Knee surgery Left     TKA  . Knee surgery Right     Periprosthetic fracture Linna Caprice   Family History  Problem Relation Age of Onset  . Colon polyps Daughter 47  . Stroke Father   . Huntington's disease Mother   . Throat cancer Son    History  Substance Use Topics  . Smoking status: Never Smoker   . Smokeless tobacco: Never Used  . Alcohol Use: No   OB History   Grav Para Term Preterm Abortions TAB SAB Ect Mult Living                 Review of Systems  Unable to perform ROS: Other  Musculoskeletal: Positive for arthralgias.  Neurological: Negative for dizziness.   A complete 10 system review of systems was obtained and all systems are negative except as noted in the HPI and PMH.   Allergies  Review of patient's allergies indicates no known allergies.  Home Medications   Prior to Admission medications   Medication Sig Start Date End Date Taking? Authorizing Provider  albuterol (PROVENTIL HFA;VENTOLIN HFA) 108 (90 BASE) MCG/ACT inhaler Inhale 2 puffs into the lungs every 6 (six) hours as needed for wheezing or shortness  of breath.   Yes Historical Provider, MD  albuterol (PROVENTIL) (5 MG/ML) 0.5% nebulizer solution Take 0.5 mLs (2.5 mg total) by nebulization every 4 (four) hours as needed for wheezing or shortness of breath. 08/16/12  Yes Rosita Fire, MD  ALPRAZolam (XANAX) 0.25 MG tablet Take 0.25 mg by mouth 3 (three) times daily as needed for sleep or anxiety.   Yes Historical Provider, MD  amLODipine (NORVASC) 5 MG tablet Take 5 mg by mouth daily.   Yes Historical Provider, MD  baclofen (LIORESAL) 10 MG tablet Take 10 mg by mouth 3 (three) times daily.   Yes Historical Provider, MD  benazepril-hydrochlorthiazide (LOTENSIN HCT) 20-12.5 MG per tablet Take 1 tablet by mouth daily.   Yes Historical Provider, MD  Calcium Carbonate-Vitamin D (CALCIUM + D) 600-200 MG-UNIT TABS Take 1 tablet by mouth every evening.    Yes Historical Provider, MD  clopidogrel (PLAVIX) 75  MG tablet Take 1 tablet (75 mg total) by mouth daily with breakfast. 08/16/12  Yes Rosita Fire, MD  donepezil (ARICEPT) 5 MG tablet Take 5 mg by mouth daily.   Yes Historical Provider, MD  escitalopram (LEXAPRO) 10 MG tablet Take 10 mg by mouth daily.   Yes Historical Provider, MD  ferrous gluconate (FERGON) 325 MG tablet Take 325 mg by mouth daily with breakfast.   Yes Historical Provider, MD  FLUoxetine (PROZAC) 20 MG capsule Take 20 mg by mouth daily.   Yes Historical Provider, MD  folic acid (FOLVITE) 1 MG tablet Take 1 mg by mouth daily.   Yes Historical Provider, MD  furosemide (LASIX) 20 MG tablet Take 20 mg by mouth daily.   Yes Historical Provider, MD  gabapentin (NEURONTIN) 300 MG capsule Take 1 capsule (300 mg total) by mouth 3 (three) times daily. 06/02/12  Yes Carole Civil, MD  HYDROcodone-acetaminophen (NORCO/VICODIN) 5-325 MG per tablet Take 1 tablet by mouth every 4 (four) hours as needed for moderate pain. 06/02/12  Yes Carole Civil, MD  ipratropium (ATROVENT) 0.02 % nebulizer solution Take 2.5 mLs (0.5 mg total) by nebulization 3 (three) times daily. 08/16/12  Yes Rosita Fire, MD  latanoprost (XALATAN) 0.005 % ophthalmic solution Place 1 drop into both eyes at bedtime.   Yes Historical Provider, MD  levETIRAcetam (KEPPRA) 250 MG tablet Take 250 mg by mouth 2 (two) times daily.   Yes Historical Provider, MD  losartan (COZAAR) 100 MG tablet Take 100 mg by mouth daily.   Yes Historical Provider, MD  lovastatin (MEVACOR) 10 MG tablet Take 10 mg by mouth at bedtime.   Yes Historical Provider, MD  meclizine (ANTIVERT) 25 MG tablet Take 25 mg by mouth every 6 (six) hours as needed. dizziness   Yes Historical Provider, MD  olmesartan (BENICAR) 40 MG tablet Take 40 mg by mouth daily.   Yes Historical Provider, MD  omeprazole (PRILOSEC) 20 MG capsule Take 20 mg by mouth daily.   Yes Historical Provider, MD  potassium chloride (K-DUR,KLOR-CON) 10 MEQ tablet Take 10 mEq by mouth  daily.   Yes Historical Provider, MD   Triage vitals: BP 124/68  Pulse 75  Temp(Src) 98.3 F (36.8 C) (Oral)  Resp 20  SpO2 98%  Physical Exam  Nursing note and vitals reviewed. Constitutional: She is oriented to person, place, and time. She appears well-developed and well-nourished.  HENT:  Head: Normocephalic and atraumatic.  Eyes: Conjunctivae and EOM are normal. Pupils are equal, round, and reactive to light.  Neck: Normal range of motion. Neck supple.  Cardiovascular: Normal rate, regular rhythm and normal heart sounds.   Pulmonary/Chest: Effort normal and breath sounds normal.  Abdominal: Soft. Bowel sounds are normal.  Musculoskeletal: Normal range of motion.  Tenderness at the left mandible, proximal lateral left hip, superior aspect of the left shoulder. Minimal tenderness at the right lateral hip  Neurological: She is alert and oriented to person, place, and time.  Skin: Skin is warm and dry.  Psychiatric: She has a normal mood and affect. Her behavior is normal.    ED Course  Procedures (including critical care time) DIAGNOSTIC STUDIES: Oxygen Saturation is 98% on room air, normal by my interpretation.    COORDINATION OF CARE: At 2:13 PM: Discussed treatment plan with patient which includes x-ray of the bilateral hips, left houlder, and maxilla facial of the chin. Patient agrees.   Labs Review Labs Reviewed - No data to display  Imaging Review Dg Hip Bilateral W/pelvis  07/17/2013   CLINICAL DATA:  Bilateral hip pain  EXAM: BILATERAL HIP WITH PELVIS - 4+ VIEW  COMPARISON:  Plain films 08/18/2011  FINDINGS: There is joint space narrowing of the left and right hip with associated subchondral cystic change and sclerosis. No evidence femoral neck fracture. No pelvic fracture or sacral fracture  IMPRESSION: 1. Moderate to severe osteoarthritis of the hips. 2. No evidence fracture dislocation.   Electronically Signed   By: Suzy Bouchard M.D.   On: 07/17/2013 12:22    Dg Shoulder Left  07/17/2013   CLINICAL DATA:  BILATERAL hip and LEFT shoulder pain post fall  EXAM: LEFT SHOULDER - 2+ VIEW  COMPARISON:  08/18/2011  FINDINGS: Osseous demineralization.  AC joint alignment normal.  Advanced glenohumeral degenerative changes with joint space obliteration, spur formation and subchondral sclerosis.  Secondary deformities of the glenoid and humeral head.  No acute fracture, dislocation or additional bone destruction.  Visualized LEFT ribs intact.  IMPRESSION: Osseous demineralization with advanced LEFT glenohumeral degenerative changes.  No acute abnormalities.   Electronically Signed   By: Lavonia Dana M.D.   On: 07/17/2013 12:22   Ct Maxillofacial Wo Cm  07/17/2013   CLINICAL DATA:  Fall, episode of vertigo, tripped over a flower pot and struck ground, BILATERAL shoulder, LEFT mandibular and LEFT forearm pain  EXAM: CT MAXILLOFACIAL WITHOUT CONTRAST  TECHNIQUE: Multidetector CT imaging of the maxillofacial structures was performed. Multiplanar CT image reconstructions were also generated. A small metallic BB was placed on the right temple in order to reliably differentiate right from left.  COMPARISON:  None  FINDINGS: Minimal RIGHT supraorbital soft tissue swelling.  Remaining facial and orbital soft tissues normal.  Intracranial contents significant for atrophy and small vessel chronic ischemic changes.  Mild hyperostosis frontalis interna.  Orbits intact.  Paranasal sinuses, mastoid air cells and middle ear cavities clear.  Nasal septum midline.  No facial bone fractures identified.  Patient edentulous.  Visualize cervical spine intact.  Mild motion artifacts at the mandible.  IMPRESSION: No acute facial bone abnormalities identified as above.   Electronically Signed   By: Lavonia Dana M.D.   On: 07/17/2013 12:34     EKG Interpretation   Date/Time:  Sunday July 17 2013 10:57:26 EDT Ventricular Rate:  80 PR Interval:  168 QRS Duration: 86 QT Interval:  404 QTC  Calculation: 466 R Axis:   -7 Text Interpretation:  Sinus rhythm ST elevation, consider inferior injury  Confirmed by Leilani Cespedes  MD, Phat Dalton ([id number]) on 07/17/2013 11:24:34 AM      MDM  Final diagnoses:  Fall, initial encounter  Bilateral hip pain   x-rays of both hips, left shoulder, CT maxillofacial all negative for fracture. Patient is neurologically intact.  I personally performed the services described in this documentation, which was scribed in my presence. The recorded information has been reviewed and is accurate.     Nat Christen, MD 07/17/13 1414

## 2013-07-17 NOTE — Discharge Instructions (Signed)
X-ray showed no fracture. Tylenol for pain. Followup your primary care Dr.   Alvera Singh pack.

## 2013-07-17 NOTE — ED Notes (Signed)
Pt assisted to bedpan, tolerated well,  

## 2013-07-17 NOTE — ED Notes (Addendum)
Pt states that she was walking out to the trash can, had an episode of dizziness, tripped over a flower pot landing on right side. Pt arrived to er by [facility], alert, able to answer questions, cms intact all extremities, pt c/o pain to bilateral hips, shoulders, chin and pain to left side of forehead area. Denies any LOC, states that the does have problems with vertigo and that was what caused her to become dizzy this am. CBG with EMS 151

## 2013-08-26 DIAGNOSIS — H409 Unspecified glaucoma: Secondary | ICD-10-CM | POA: Diagnosis not present

## 2013-08-26 DIAGNOSIS — H04129 Dry eye syndrome of unspecified lacrimal gland: Secondary | ICD-10-CM | POA: Diagnosis not present

## 2013-08-26 DIAGNOSIS — H47239 Glaucomatous optic atrophy, unspecified eye: Secondary | ICD-10-CM | POA: Diagnosis not present

## 2013-09-05 DIAGNOSIS — E7800 Pure hypercholesterolemia, unspecified: Secondary | ICD-10-CM | POA: Diagnosis not present

## 2013-09-05 DIAGNOSIS — E119 Type 2 diabetes mellitus without complications: Secondary | ICD-10-CM | POA: Diagnosis not present

## 2013-09-05 DIAGNOSIS — I10 Essential (primary) hypertension: Secondary | ICD-10-CM | POA: Diagnosis not present

## 2013-09-05 DIAGNOSIS — Z0000 Encounter for general adult medical examination without abnormal findings: Secondary | ICD-10-CM | POA: Diagnosis not present

## 2013-09-05 DIAGNOSIS — R799 Abnormal finding of blood chemistry, unspecified: Secondary | ICD-10-CM | POA: Diagnosis not present

## 2013-09-05 DIAGNOSIS — E039 Hypothyroidism, unspecified: Secondary | ICD-10-CM | POA: Diagnosis not present

## 2013-09-05 DIAGNOSIS — R7309 Other abnormal glucose: Secondary | ICD-10-CM | POA: Diagnosis not present

## 2013-09-05 DIAGNOSIS — IMO0001 Reserved for inherently not codable concepts without codable children: Secondary | ICD-10-CM | POA: Diagnosis not present

## 2013-09-05 DIAGNOSIS — Z23 Encounter for immunization: Secondary | ICD-10-CM | POA: Diagnosis not present

## 2013-09-10 DIAGNOSIS — E119 Type 2 diabetes mellitus without complications: Secondary | ICD-10-CM | POA: Diagnosis not present

## 2013-11-17 DIAGNOSIS — Z23 Encounter for immunization: Secondary | ICD-10-CM | POA: Diagnosis not present

## 2013-12-05 DIAGNOSIS — I10 Essential (primary) hypertension: Secondary | ICD-10-CM | POA: Diagnosis not present

## 2013-12-05 DIAGNOSIS — M159 Polyosteoarthritis, unspecified: Secondary | ICD-10-CM | POA: Diagnosis not present

## 2013-12-05 DIAGNOSIS — F329 Major depressive disorder, single episode, unspecified: Secondary | ICD-10-CM | POA: Diagnosis not present

## 2013-12-05 DIAGNOSIS — E1165 Type 2 diabetes mellitus with hyperglycemia: Secondary | ICD-10-CM | POA: Diagnosis not present

## 2013-12-16 ENCOUNTER — Other Ambulatory Visit (HOSPITAL_COMMUNITY): Payer: Self-pay | Admitting: Internal Medicine

## 2013-12-16 DIAGNOSIS — Z1231 Encounter for screening mammogram for malignant neoplasm of breast: Secondary | ICD-10-CM

## 2013-12-19 ENCOUNTER — Ambulatory Visit (HOSPITAL_COMMUNITY): Payer: Medicare Other

## 2013-12-27 ENCOUNTER — Institutional Professional Consult (permissible substitution): Payer: Medicare Other | Admitting: Neurology

## 2013-12-27 ENCOUNTER — Ambulatory Visit (INDEPENDENT_AMBULATORY_CARE_PROVIDER_SITE_OTHER): Payer: Medicare Other | Admitting: Neurology

## 2013-12-27 ENCOUNTER — Encounter: Payer: Self-pay | Admitting: Neurology

## 2013-12-27 VITALS — BP 128/82 | HR 73 | Ht 62.0 in | Wt 230.0 lb

## 2013-12-27 DIAGNOSIS — I639 Cerebral infarction, unspecified: Secondary | ICD-10-CM | POA: Diagnosis not present

## 2013-12-27 DIAGNOSIS — G40909 Epilepsy, unspecified, not intractable, without status epilepticus: Secondary | ICD-10-CM | POA: Diagnosis not present

## 2013-12-27 DIAGNOSIS — Z9189 Other specified personal risk factors, not elsewhere classified: Secondary | ICD-10-CM | POA: Diagnosis not present

## 2013-12-27 DIAGNOSIS — Z8679 Personal history of other diseases of the circulatory system: Secondary | ICD-10-CM

## 2013-12-27 DIAGNOSIS — R2990 Unspecified symptoms and signs involving the nervous system: Secondary | ICD-10-CM | POA: Insufficient documentation

## 2013-12-27 DIAGNOSIS — R42 Dizziness and giddiness: Secondary | ICD-10-CM | POA: Insufficient documentation

## 2013-12-27 NOTE — Progress Notes (Signed)
PATIENT: Tiffany Hartman DOB: 1927/09/02  HISTORICAL Tiffany Hartman is a 80 years old right-handed African American female, is referred by her PCP Dr. Legrand Rams for evaluation of acute onset dizziness, increased gait difficulty, she is accompanied by her daughter,  I have saw her previously for tremor,  She had a past medical history of hypertension, diabetes, hyperlipidemia, migraine, depression, anxiety, and history of left breast cancer, status post lobectomy in 1985, bilateral knee replacement, right femoral fracture  She also has a strong family history of Huntington's disease, her mother died of Huntington's disease at age 54, she was disabled, could not walk, her mind was completely gone, she also had 3 other siblings, died of Huntington's disease, her older brother died at age 16 from coronary artery disease, she is the only surviving child of her generation. She has been tested at [facility], she does not carry Huntington's disease gene, herself has 6 children, none of them show signs of Huntington's. Her child, and her grandchild has been genetically tested as well  Since 2011, she noticed jaw tremor, later she also developed voice tremor, also gradual onset bilateral hands resting tremor, she was followed by local neurologist Dr. Constance Holster, was told that she has possibility of Parkinson's disease, her symptoms has gradually progressed over the past 3 years, she now has gait difficulty mainly due to bilateral knee pain, right leg pain from her previous femoral fracture,  She denies swallowing difficulties, no REM sleep disorder, no loss sense of smell, no constipation.  MRI brain showed extensive white matter disease, MRA itrancranial atherosclerotic disease  She had no typical parkinsonian features, did not responding to a trial of Sinemet, later was tapered off, her gait difficulty are likely a combination of obesity, deconditioning, bilateral knee pain, right femoral fracture, extensive  small vessel disease  Ultrasound of carotid artery showed left side 50% stenosis, mild stenosis of right carotid artery.  Echocardiogram showed ejection fraction 60-65%  She reported acute onset of dizziness, lightheadedness since November 20 fifth 2015, which has been persistent since then, she woke up, while taking few steps, she noticed lightheadedness, increased unsteady gait, she has been taking Antivert followed by lying down, which has been helpful, she rarely become symptomatic in sitting position, she has to use couple pillows, lower pillow will make her dizzy,  REVIEW OF SYSTEMS: Full 14 system review of systems performed and notable only for as above   ALLERGIES: No Known Allergies  HOME MEDICATIONS: Current Outpatient Prescriptions on File Prior to Visit  Medication Sig Dispense Refill  . albuterol (PROVENTIL HFA;VENTOLIN HFA) 108 (90 BASE) MCG/ACT inhaler Inhale 2 puffs into the lungs every 6 (six) hours as needed for wheezing or shortness of breath.    Marland Kitchen albuterol (PROVENTIL) (5 MG/ML) 0.5% nebulizer solution Take 0.5 mLs (2.5 mg total) by nebulization every 4 (four) hours as needed for wheezing or shortness of breath. 20 mL 12  . ALPRAZolam (XANAX) 0.25 MG tablet Take 0.25 mg by mouth 3 (three) times daily as needed for sleep or anxiety.    Marland Kitchen amLODipine (NORVASC) 5 MG tablet Take 5 mg by mouth daily.    . baclofen (LIORESAL) 10 MG tablet Take 10 mg by mouth 3 (three) times daily.    . benazepril-hydrochlorthiazide (LOTENSIN HCT) 20-12.5 MG per tablet Take 1 tablet by mouth daily.    . Calcium Carbonate-Vitamin D (CALCIUM + D) 600-200 MG-UNIT TABS Take 1 tablet by mouth every evening.     . clopidogrel (PLAVIX)  75 MG tablet Take 1 tablet (75 mg total) by mouth daily with breakfast. 30 tablet 3  . donepezil (ARICEPT) 5 MG tablet Take 5 mg by mouth daily.    Marland Kitchen escitalopram (LEXAPRO) 10 MG tablet Take 10 mg by mouth daily.    . ferrous gluconate (FERGON) 325 MG tablet Take 325 mg  by mouth daily with breakfast.    . FLUoxetine (PROZAC) 20 MG capsule Take 20 mg by mouth daily.    . folic acid (FOLVITE) 1 MG tablet Take 1 mg by mouth daily.    . furosemide (LASIX) 20 MG tablet Take 20 mg by mouth daily.    Marland Kitchen gabapentin (NEURONTIN) 300 MG capsule Take 1 capsule (300 mg total) by mouth 3 (three) times daily. 30 capsule 1  . HYDROcodone-acetaminophen (NORCO/VICODIN) 5-325 MG per tablet Take 1 tablet by mouth every 4 (four) hours as needed for moderate pain.    Marland Kitchen ipratropium (ATROVENT) 0.02 % nebulizer solution Take 2.5 mLs (0.5 mg total) by nebulization 3 (three) times daily. 75 mL 12  . latanoprost (XALATAN) 0.005 % ophthalmic solution Place 1 drop into both eyes at bedtime.    . levETIRAcetam (KEPPRA) 250 MG tablet Take 250 mg by mouth 2 (two) times daily.    Marland Kitchen losartan (COZAAR) 100 MG tablet Take 100 mg by mouth daily.    Marland Kitchen lovastatin (MEVACOR) 10 MG tablet Take 10 mg by mouth at bedtime.    . meclizine (ANTIVERT) 25 MG tablet Take 25 mg by mouth every 6 (six) hours as needed. dizziness    . olmesartan (BENICAR) 40 MG tablet Take 40 mg by mouth daily.    Marland Kitchen omeprazole (PRILOSEC) 20 MG capsule Take 20 mg by mouth daily.    . potassium chloride (K-DUR,KLOR-CON) 10 MEQ tablet Take 10 mEq by mouth daily.     No current facility-administered medications on file prior to visit.    PAST MEDICAL HISTORY: Past Medical History  Diagnosis Date  . Dementia   . Bronchitis   . Asthma   . Diabetes mellitus   . Parkinson disease   . HTN (hypertension)   . OA (osteoarthritis)   . Thyroid nodule     Dr Legrand Rams  . Glaucoma   . Diverticulitis 2009  . Breast cancer 02/20/83    LT mastectomy    PAST SURGICAL HISTORY: Past Surgical History  Procedure Laterality Date  . Abdominal hysterectomy      complete  . Joint replacement  1998/1999    knee boths  . Mastectomy  02/20/1983    left   . Fracture surgery  2008     right and left femurs  . Appendectomy    .  bilateral  catracts    . Eus  09/10/2011    Procedure: UPPER ENDOSCOPIC ULTRASOUND (EUS) RADIAL;  Surgeon: Arta Silence, MD;  Location: [facility];  Service: Endoscopy;  Laterality: N/A;  christina/ebp  . Fine needle aspiration  09/10/2011    Procedure: FINE NEEDLE ASPIRATION (FNA) LINEAR;  Surgeon: Arta Silence, MD;  Location: [facility];  Service: Endoscopy;  Laterality: N/A;  . Knee surgery Right     TKA  . Knee surgery Left     TKA  . Knee surgery Right     Periprosthetic fracture Linna Caprice    FAMILY HISTORY: Family History  Problem Relation Age of Onset  . Colon polyps Daughter 43  . Stroke Father   . Huntington's disease Mother   . Throat cancer Son  SOCIAL HISTORY:  History   Social History  . Marital Status: Widowed    Spouse Name: N/A    Number of Children: 6  . Years of Education: 12   Occupational History  . retired, CNA [employer]    Social History Main Topics  . Smoking status: Never Smoker   . Smokeless tobacco: Never Used  . Alcohol Use: No  . Drug Use: No  . Sexual Activity: No   Other Topics Concern  . Not on file   Social History Narrative   Lives w/ daughter Carolynn Comment).   Retired   Right handed.   Education high school   Caffeine None              PHYSICAL EXAM   There were no vitals filed for this visit.  Not recorded     standing up blood pressure 150 out of 80, sitting down blood pressure 150/70  There is no weight on file to calculate BMI.   Generalized: In no acute distress  Neck: Supple, no carotid bruits   Cardiac: Regular rate rhythm  Pulmonary: Clear to auscultation bilaterally  Musculoskeletal: No deformity  Neurological examination  Mentation: Alert oriented to time, place, history taking, and causual conversation  Cranial nerve II-XII: Pupils were equal round reactive to light. Extraocular movements were full.  Visual field were full on confrontational test. Bilateral fundi were sharp.  Facial sensation and strength were  normal. Hearing was intact to finger rubbing bilaterally. Uvula tongue midline.  Head turning and shoulder shrug and were normal and symmetric.Tongue protrusion into cheek strength was normal.  Motor: Normal tone, bulk and strength.  Sensory: Length dependent decreased fine touch, pinprick, absent vibratory sensation at toes.  Coordination: Normal finger to nose, heel-to-shin bilaterally there was no truncal ataxia  Gait: Rising up from seated position without assistance, normal stance, without trunk ataxia, cautious, mildly atalgic gait   Deep tendon reflexes: Brachioradialis 2/2, biceps 2/2, triceps 2/2, patellar 2/2, Achilles absent , plantar responses were flexor bilaterally.   DIAGNOSTIC DATA (LABS, IMAGING, TESTING) - I reviewed patient records, labs, notes, testing and imaging myself where available.  Lab Results  Component Value Date   WBC 7.9 08/11/2012   HGB 11.6* 08/11/2012   HCT 37.6 08/11/2012   MCV 91.7 08/11/2012   PLT 333 08/11/2012      Component Value Date/Time   NA 140 08/11/2012 0519   NA 145 08/21/2011 1442   K 3.8 08/11/2012 0519   K 4.6 08/21/2011 1442   CL 105 08/11/2012 0519   CL 109 08/21/2011 1442   CO2 26 08/11/2012 0519   GLUCOSE 106* 08/11/2012 0519   BUN 10 08/11/2012 0519   BUN 17 08/21/2011 1442   CREATININE 0.99 08/11/2012 0519   CREATININE 1.12 08/21/2011 1442   CALCIUM 8.7 08/11/2012 0519   CALCIUM 9.3 08/21/2011 1442   PROT 7.0 08/10/2012 1838   PROT 6.9 08/21/2011 1442   ALBUMIN 3.6 08/10/2012 1838   AST 13 08/10/2012 1838   AST 13 08/21/2011 1442   ALT 10 08/10/2012 1838   ALKPHOS 65 08/10/2012 1838   ALKPHOS 62 08/21/2011 1442   BILITOT 0.3 08/10/2012 1838   BILITOT 0.4 08/21/2011 1442   GFRNONAA 51* 08/11/2012 0519   GFRAA 59* 08/11/2012 0519   Lab Results  Component Value Date   CHOL 163 08/11/2012   HDL 45 08/11/2012   LDLCALC 91 08/11/2012   TRIG 135 08/11/2012   CHOLHDL 3.6 08/11/2012   Lab Results  Component  Value Date   HGBA1C 6.5* 08/11/2012   Lab Results  Component Value Date   FIEPPIRJ18 841 05/16/2009    ASSESSMENT AND PLAN  TEMILOLUWA RECCHIA is a 80 y.o. female presenting with acute onset of dizziness,  positional related, she had a vascular risk factor of hypertension, mild diabetes, mildly length dependent sensory changes suggestive of diabetic peripheral neuropathy, complains of dizziness could be related to orthostatic blood pressure changes, even now is not demonstrated at today's examination, possibility also including brainstem/cerebellar stroke, she had a history of known carotid stenosis,  Repeat MRI of the brain, MRA of the brain, Keep Plavix daily Return to clinic in one month Keep while hydration, avoid Antivert   Marcial Pacas, M.D. Ph.D.  [facility] [address], [place], [place] [address] [phone]

## 2014-01-16 DIAGNOSIS — Z1211 Encounter for screening for malignant neoplasm of colon: Secondary | ICD-10-CM | POA: Diagnosis not present

## 2014-02-06 ENCOUNTER — Ambulatory Visit: Payer: Medicare Other | Admitting: Neurology

## 2014-02-20 ENCOUNTER — Other Ambulatory Visit (HOSPITAL_COMMUNITY): Payer: Self-pay | Admitting: "Endocrinology

## 2014-02-20 DIAGNOSIS — E049 Nontoxic goiter, unspecified: Secondary | ICD-10-CM

## 2014-03-15 ENCOUNTER — Ambulatory Visit (HOSPITAL_COMMUNITY)
Admission: RE | Admit: 2014-03-15 | Discharge: 2014-03-15 | Disposition: A | Discharge status: Home / Self Care | Payer: Medicare Other | Source: Ambulatory Visit | Attending: "Endocrinology | Admitting: "Endocrinology

## 2014-03-15 DIAGNOSIS — E041 Nontoxic single thyroid nodule: Secondary | ICD-10-CM | POA: Diagnosis not present

## 2014-03-15 DIAGNOSIS — E049 Nontoxic goiter, unspecified: Secondary | ICD-10-CM | POA: Insufficient documentation

## 2014-03-16 DIAGNOSIS — R7309 Other abnormal glucose: Secondary | ICD-10-CM | POA: Diagnosis not present

## 2014-03-16 DIAGNOSIS — E669 Obesity, unspecified: Secondary | ICD-10-CM | POA: Diagnosis not present

## 2014-03-16 DIAGNOSIS — E784 Other hyperlipidemia: Secondary | ICD-10-CM | POA: Diagnosis not present

## 2014-03-16 DIAGNOSIS — E1165 Type 2 diabetes mellitus with hyperglycemia: Secondary | ICD-10-CM | POA: Diagnosis not present

## 2014-03-16 DIAGNOSIS — I10 Essential (primary) hypertension: Secondary | ICD-10-CM | POA: Diagnosis not present

## 2014-03-16 DIAGNOSIS — E780 Pure hypercholesterolemia: Secondary | ICD-10-CM | POA: Diagnosis not present

## 2014-03-16 DIAGNOSIS — E039 Hypothyroidism, unspecified: Secondary | ICD-10-CM | POA: Diagnosis not present

## 2014-03-23 DIAGNOSIS — E042 Nontoxic multinodular goiter: Secondary | ICD-10-CM | POA: Diagnosis not present

## 2014-04-18 DIAGNOSIS — H4011X3 Primary open-angle glaucoma, severe stage: Secondary | ICD-10-CM | POA: Diagnosis not present

## 2014-04-18 DIAGNOSIS — H04123 Dry eye syndrome of bilateral lacrimal glands: Secondary | ICD-10-CM | POA: Diagnosis not present

## 2014-05-01 DIAGNOSIS — M159 Polyosteoarthritis, unspecified: Secondary | ICD-10-CM | POA: Diagnosis not present

## 2014-05-01 DIAGNOSIS — K922 Gastrointestinal hemorrhage, unspecified: Secondary | ICD-10-CM | POA: Diagnosis not present

## 2014-05-01 DIAGNOSIS — E1165 Type 2 diabetes mellitus with hyperglycemia: Secondary | ICD-10-CM | POA: Diagnosis not present

## 2014-05-01 DIAGNOSIS — I10 Essential (primary) hypertension: Secondary | ICD-10-CM | POA: Diagnosis not present

## 2014-07-31 ENCOUNTER — Other Ambulatory Visit (HOSPITAL_COMMUNITY)
Admission: RE | Admit: 2014-07-31 | Discharge: 2014-07-31 | Disposition: A | Discharge status: Home / Self Care | Payer: Medicare Other | Source: Ambulatory Visit | Attending: Internal Medicine | Admitting: Internal Medicine

## 2014-07-31 ENCOUNTER — Other Ambulatory Visit (HOSPITAL_COMMUNITY): Payer: Self-pay | Admitting: Internal Medicine

## 2014-07-31 ENCOUNTER — Ambulatory Visit (HOSPITAL_COMMUNITY)
Admission: RE | Admit: 2014-07-31 | Discharge: 2014-07-31 | Disposition: A | Discharge status: Home / Self Care | Payer: Medicare Other | Source: Ambulatory Visit | Attending: Internal Medicine | Admitting: Internal Medicine

## 2014-07-31 DIAGNOSIS — J45909 Unspecified asthma, uncomplicated: Secondary | ICD-10-CM | POA: Diagnosis not present

## 2014-07-31 DIAGNOSIS — M159 Polyosteoarthritis, unspecified: Secondary | ICD-10-CM | POA: Diagnosis not present

## 2014-07-31 DIAGNOSIS — R0602 Shortness of breath: Secondary | ICD-10-CM | POA: Diagnosis not present

## 2014-07-31 DIAGNOSIS — R05 Cough: Secondary | ICD-10-CM | POA: Diagnosis not present

## 2014-07-31 DIAGNOSIS — E1165 Type 2 diabetes mellitus with hyperglycemia: Secondary | ICD-10-CM | POA: Diagnosis not present

## 2014-07-31 DIAGNOSIS — E784 Other hyperlipidemia: Secondary | ICD-10-CM | POA: Diagnosis not present

## 2014-07-31 DIAGNOSIS — Z853 Personal history of malignant neoplasm of breast: Secondary | ICD-10-CM | POA: Diagnosis not present

## 2014-07-31 DIAGNOSIS — G459 Transient cerebral ischemic attack, unspecified: Secondary | ICD-10-CM | POA: Diagnosis not present

## 2014-07-31 DIAGNOSIS — E119 Type 2 diabetes mellitus without complications: Secondary | ICD-10-CM | POA: Diagnosis not present

## 2014-07-31 DIAGNOSIS — I10 Essential (primary) hypertension: Secondary | ICD-10-CM | POA: Diagnosis not present

## 2014-08-07 ENCOUNTER — Ambulatory Visit (HOSPITAL_COMMUNITY): Admission: RE | Admit: 2014-08-07 | Payer: Medicare Other | Source: Ambulatory Visit

## 2014-08-11 ENCOUNTER — Other Ambulatory Visit (HOSPITAL_COMMUNITY)
Admission: RE | Admit: 2014-08-11 | Discharge: 2014-08-11 | Disposition: A | Discharge status: Home / Self Care | Payer: Medicare Other | Source: Ambulatory Visit | Attending: Internal Medicine | Admitting: Internal Medicine

## 2014-08-11 ENCOUNTER — Ambulatory Visit (HOSPITAL_COMMUNITY)
Admission: RE | Admit: 2014-08-11 | Discharge: 2014-08-11 | Disposition: A | Discharge status: Home / Self Care | Payer: Medicare Other | Source: Ambulatory Visit | Attending: Internal Medicine | Admitting: Internal Medicine

## 2014-08-11 DIAGNOSIS — I10 Essential (primary) hypertension: Secondary | ICD-10-CM | POA: Diagnosis not present

## 2014-08-11 DIAGNOSIS — E784 Other hyperlipidemia: Secondary | ICD-10-CM | POA: Insufficient documentation

## 2014-08-11 DIAGNOSIS — G459 Transient cerebral ischemic attack, unspecified: Secondary | ICD-10-CM | POA: Diagnosis not present

## 2014-08-11 DIAGNOSIS — E1165 Type 2 diabetes mellitus with hyperglycemia: Secondary | ICD-10-CM | POA: Diagnosis not present

## 2014-08-11 DIAGNOSIS — R0602 Shortness of breath: Secondary | ICD-10-CM | POA: Diagnosis not present

## 2014-08-11 DIAGNOSIS — I081 Rheumatic disorders of both mitral and tricuspid valves: Secondary | ICD-10-CM | POA: Diagnosis not present

## 2014-08-11 DIAGNOSIS — M159 Polyosteoarthritis, unspecified: Secondary | ICD-10-CM | POA: Insufficient documentation

## 2014-08-11 LAB — BRAIN NATRIURETIC PEPTIDE: B Natriuretic Peptide: 51 pg/mL (ref 0.0–100.0)

## 2014-08-17 ENCOUNTER — Encounter (HOSPITAL_COMMUNITY): Payer: Self-pay

## 2014-08-17 ENCOUNTER — Inpatient Hospital Stay (HOSPITAL_COMMUNITY)
Admission: AD | Admit: 2014-08-17 | Discharge: 2014-08-22 | DRG: 192 | Disposition: A | Discharge status: Home Health | Payer: Medicare Other | Source: Ambulatory Visit | Attending: Internal Medicine | Admitting: Internal Medicine

## 2014-08-17 ENCOUNTER — Inpatient Hospital Stay (HOSPITAL_COMMUNITY): Payer: Medicare Other

## 2014-08-17 DIAGNOSIS — E119 Type 2 diabetes mellitus without complications: Secondary | ICD-10-CM | POA: Diagnosis present

## 2014-08-17 DIAGNOSIS — H409 Unspecified glaucoma: Secondary | ICD-10-CM | POA: Diagnosis present

## 2014-08-17 DIAGNOSIS — Z823 Family history of stroke: Secondary | ICD-10-CM | POA: Diagnosis not present

## 2014-08-17 DIAGNOSIS — G20 Parkinson's disease: Secondary | ICD-10-CM | POA: Diagnosis present

## 2014-08-17 DIAGNOSIS — J479 Bronchiectasis, uncomplicated: Secondary | ICD-10-CM | POA: Diagnosis not present

## 2014-08-17 DIAGNOSIS — Z7902 Long term (current) use of antithrombotics/antiplatelets: Secondary | ICD-10-CM | POA: Diagnosis not present

## 2014-08-17 DIAGNOSIS — Z853 Personal history of malignant neoplasm of breast: Secondary | ICD-10-CM

## 2014-08-17 DIAGNOSIS — F0390 Unspecified dementia without behavioral disturbance: Secondary | ICD-10-CM | POA: Diagnosis not present

## 2014-08-17 DIAGNOSIS — M1990 Unspecified osteoarthritis, unspecified site: Secondary | ICD-10-CM | POA: Diagnosis present

## 2014-08-17 DIAGNOSIS — R0600 Dyspnea, unspecified: Secondary | ICD-10-CM | POA: Diagnosis present

## 2014-08-17 DIAGNOSIS — J449 Chronic obstructive pulmonary disease, unspecified: Secondary | ICD-10-CM | POA: Diagnosis not present

## 2014-08-17 DIAGNOSIS — J9811 Atelectasis: Secondary | ICD-10-CM | POA: Diagnosis not present

## 2014-08-17 DIAGNOSIS — R0781 Pleurodynia: Secondary | ICD-10-CM | POA: Diagnosis not present

## 2014-08-17 DIAGNOSIS — Z808 Family history of malignant neoplasm of other organs or systems: Secondary | ICD-10-CM | POA: Diagnosis not present

## 2014-08-17 DIAGNOSIS — F419 Anxiety disorder, unspecified: Secondary | ICD-10-CM | POA: Diagnosis present

## 2014-08-17 DIAGNOSIS — E669 Obesity, unspecified: Secondary | ICD-10-CM

## 2014-08-17 DIAGNOSIS — J441 Chronic obstructive pulmonary disease with (acute) exacerbation: Secondary | ICD-10-CM | POA: Diagnosis not present

## 2014-08-17 DIAGNOSIS — Z9012 Acquired absence of left breast and nipple: Secondary | ICD-10-CM | POA: Diagnosis present

## 2014-08-17 DIAGNOSIS — E1149 Type 2 diabetes mellitus with other diabetic neurological complication: Secondary | ICD-10-CM

## 2014-08-17 DIAGNOSIS — R0602 Shortness of breath: Secondary | ICD-10-CM | POA: Diagnosis not present

## 2014-08-17 DIAGNOSIS — R0789 Other chest pain: Secondary | ICD-10-CM | POA: Diagnosis not present

## 2014-08-17 DIAGNOSIS — R079 Chest pain, unspecified: Secondary | ICD-10-CM | POA: Diagnosis present

## 2014-08-17 DIAGNOSIS — I10 Essential (primary) hypertension: Secondary | ICD-10-CM | POA: Diagnosis present

## 2014-08-17 DIAGNOSIS — Z79899 Other long term (current) drug therapy: Secondary | ICD-10-CM | POA: Diagnosis not present

## 2014-08-17 HISTORY — DX: Obesity, unspecified: E66.9

## 2014-08-17 LAB — CBC
HEMATOCRIT: 36.6 % (ref 36.0–46.0)
HEMOGLOBIN: 11.7 g/dL — AB (ref 12.0–15.0)
MCH: 28.8 pg (ref 26.0–34.0)
MCHC: 32 g/dL (ref 30.0–36.0)
MCV: 90.1 fL (ref 78.0–100.0)
Platelets: 306 10*3/uL (ref 150–400)
RBC: 4.06 MIL/uL (ref 3.87–5.11)
RDW: 13.7 % (ref 11.5–15.5)
WBC: 6.4 10*3/uL (ref 4.0–10.5)

## 2014-08-17 LAB — GLUCOSE, CAPILLARY: Glucose-Capillary: 95 mg/dL (ref 65–99)

## 2014-08-17 LAB — COMPREHENSIVE METABOLIC PANEL
ALBUMIN: 3.7 g/dL (ref 3.5–5.0)
ALK PHOS: 55 U/L (ref 38–126)
ALT: 12 U/L — ABNORMAL LOW (ref 14–54)
AST: 14 U/L — ABNORMAL LOW (ref 15–41)
Anion gap: 8 (ref 5–15)
BUN: 19 mg/dL (ref 6–20)
CO2: 25 mmol/L (ref 22–32)
Calcium: 8.8 mg/dL — ABNORMAL LOW (ref 8.9–10.3)
Chloride: 107 mmol/L (ref 101–111)
Creatinine, Ser: 1.14 mg/dL — ABNORMAL HIGH (ref 0.44–1.00)
GFR calc Af Amer: 49 mL/min — ABNORMAL LOW (ref >60)
GFR calc non Af Amer: 43 mL/min — ABNORMAL LOW (ref >60)
GLUCOSE: 97 mg/dL (ref 65–99)
POTASSIUM: 3.9 mmol/L (ref 3.5–5.1)
SODIUM: 140 mmol/L (ref 135–145)
Total Bilirubin: 0.5 mg/dL (ref 0.3–1.2)
Total Protein: 6.7 g/dL (ref 6.5–8.1)

## 2014-08-17 LAB — D-DIMER, QUANTITATIVE: D-Dimer, Quant: 0.67 ug/mL-FEU — ABNORMAL HIGH (ref 0.00–0.48)

## 2014-08-17 MED ORDER — OLMESARTAN MEDOXOMIL-HCTZ 40-12.5 MG PO TABS: 1.0000 | ORAL_TABLET | Freq: Every day | ORAL | Status: DC

## 2014-08-17 MED ORDER — POTASSIUM CHLORIDE CRYS ER 10 MEQ PO TBCR
10.0000 meq | EXTENDED_RELEASE_TABLET | Freq: Every day | ORAL | Status: DC
  Administered 2014-08-18 – 2014-08-22 (×5): 10 meq via ORAL
  Filled 2014-08-17 (×5): qty 1

## 2014-08-17 MED ORDER — LEVETIRACETAM 250 MG PO TABS
250.0000 mg | ORAL_TABLET | Freq: Two times a day (BID) | ORAL | Status: DC
  Administered 2014-08-17 – 2014-08-22 (×10): 250 mg via ORAL
  Filled 2014-08-17 (×10): qty 1

## 2014-08-17 MED ORDER — INSULIN ASPART 100 UNIT/ML ~~LOC~~ SOLN
0.0000 [IU] | Freq: Every day | SUBCUTANEOUS | Status: DC
  Administered 2014-08-18 – 2014-08-21 (×2): 2 [IU] via SUBCUTANEOUS

## 2014-08-17 MED ORDER — PRAVASTATIN SODIUM 10 MG PO TABS
10.0000 mg | ORAL_TABLET | Freq: Every day | ORAL | Status: DC
  Administered 2014-08-18 – 2014-08-21 (×4): 10 mg via ORAL
  Filled 2014-08-17 (×4): qty 1

## 2014-08-17 MED ORDER — SODIUM CHLORIDE 0.9 % IJ SOLN
3.0000 mL | Freq: Two times a day (BID) | INTRAMUSCULAR | Status: DC
  Administered 2014-08-17 – 2014-08-19 (×3): 3 mL via INTRAVENOUS

## 2014-08-17 MED ORDER — FERROUS GLUCONATE 324 (38 FE) MG PO TABS
325.0000 mg | ORAL_TABLET | Freq: Every day | ORAL | Status: DC
  Administered 2014-08-18: 324 mg via ORAL
  Administered 2014-08-19 – 2014-08-20 (×2): 325 mg via ORAL
  Administered 2014-08-21: 324 mg via ORAL
  Administered 2014-08-22: 325 mg via ORAL
  Filled 2014-08-17 (×6): qty 1

## 2014-08-17 MED ORDER — IRBESARTAN 300 MG PO TABS
300.0000 mg | ORAL_TABLET | Freq: Every day | ORAL | Status: DC
  Administered 2014-08-17 – 2014-08-22 (×6): 300 mg via ORAL
  Filled 2014-08-17 (×6): qty 1

## 2014-08-17 MED ORDER — ENOXAPARIN SODIUM 40 MG/0.4ML ~~LOC~~ SOLN
40.0000 mg | SUBCUTANEOUS | Status: DC
  Administered 2014-08-17 – 2014-08-21 (×5): 40 mg via SUBCUTANEOUS
  Filled 2014-08-17 (×4): qty 0.4

## 2014-08-17 MED ORDER — AMLODIPINE BESYLATE 5 MG PO TABS
5.0000 mg | ORAL_TABLET | Freq: Every day | ORAL | Status: DC
  Administered 2014-08-18 – 2014-08-22 (×5): 5 mg via ORAL
  Filled 2014-08-17 (×5): qty 1

## 2014-08-17 MED ORDER — ENSURE ENLIVE PO LIQD
237.0000 mL | Freq: Two times a day (BID) | ORAL | Status: DC
  Administered 2014-08-19 – 2014-08-22 (×2): 237 mL via ORAL

## 2014-08-17 MED ORDER — LATANOPROST 0.005 % OP SOLN
1.0000 [drp] | Freq: Every day | OPHTHALMIC | Status: DC
  Administered 2014-08-17 – 2014-08-21 (×5): 1 [drp] via OPHTHALMIC
  Filled 2014-08-17: qty 2.5

## 2014-08-17 MED ORDER — IPRATROPIUM BROMIDE 0.02 % IN SOLN
0.5000 mg | Freq: Three times a day (TID) | RESPIRATORY_TRACT | Status: DC
  Administered 2014-08-17 – 2014-08-18 (×2): 0.5 mg via RESPIRATORY_TRACT
  Filled 2014-08-17 (×2): qty 2.5

## 2014-08-17 MED ORDER — INSULIN ASPART 100 UNIT/ML ~~LOC~~ SOLN
0.0000 [IU] | Freq: Three times a day (TID) | SUBCUTANEOUS | Status: DC
  Administered 2014-08-18: 7 [IU] via SUBCUTANEOUS
  Administered 2014-08-18 – 2014-08-19 (×3): 4 [IU] via SUBCUTANEOUS
  Administered 2014-08-19: 3 [IU] via SUBCUTANEOUS
  Administered 2014-08-20: 4 [IU] via SUBCUTANEOUS
  Administered 2014-08-20: 3 [IU] via SUBCUTANEOUS
  Administered 2014-08-20: 4 [IU] via SUBCUTANEOUS
  Administered 2014-08-21: 3 [IU] via SUBCUTANEOUS
  Administered 2014-08-21: 4 [IU] via SUBCUTANEOUS

## 2014-08-17 MED ORDER — HYDROCHLOROTHIAZIDE 12.5 MG PO CAPS
12.5000 mg | ORAL_CAPSULE | Freq: Every day | ORAL | Status: DC
  Administered 2014-08-17 – 2014-08-22 (×6): 12.5 mg via ORAL
  Filled 2014-08-17 (×5): qty 1

## 2014-08-17 MED ORDER — PANTOPRAZOLE SODIUM 40 MG PO TBEC
40.0000 mg | DELAYED_RELEASE_TABLET | Freq: Every day | ORAL | Status: DC
  Administered 2014-08-17 – 2014-08-22 (×6): 40 mg via ORAL
  Filled 2014-08-17 (×6): qty 1

## 2014-08-17 MED ORDER — LATANOPROST 0.005 % OP SOLN
OPHTHALMIC | Status: AC
  Filled 2014-08-17: qty 2.5

## 2014-08-17 MED ORDER — FLUOXETINE HCL 20 MG PO CAPS
20.0000 mg | ORAL_CAPSULE | Freq: Every day | ORAL | Status: DC
  Administered 2014-08-17 – 2014-08-22 (×6): 20 mg via ORAL
  Filled 2014-08-17 (×6): qty 1

## 2014-08-17 MED ORDER — ALPRAZOLAM 0.25 MG PO TABS
0.2500 mg | ORAL_TABLET | Freq: Three times a day (TID) | ORAL | Status: DC | PRN
  Administered 2014-08-17 – 2014-08-22 (×14): 0.25 mg via ORAL
  Filled 2014-08-17 (×14): qty 1

## 2014-08-17 MED ORDER — SODIUM CHLORIDE 0.9 % IJ SOLN
3.0000 mL | Freq: Two times a day (BID) | INTRAMUSCULAR | Status: DC
  Administered 2014-08-18 – 2014-08-19 (×2): 3 mL via INTRAVENOUS

## 2014-08-17 MED ORDER — CALCIUM CARBONATE-VITAMIN D 600-200 MG-UNIT PO TABS: 1.0000 | ORAL_TABLET | Freq: Every evening | ORAL | Status: DC

## 2014-08-17 MED ORDER — GABAPENTIN 300 MG PO CAPS
300.0000 mg | ORAL_CAPSULE | Freq: Three times a day (TID) | ORAL | Status: DC
  Administered 2014-08-17 – 2014-08-22 (×14): 300 mg via ORAL
  Filled 2014-08-17 (×5): qty 1
  Filled 2014-08-17: qty 3
  Filled 2014-08-17 (×8): qty 1

## 2014-08-17 MED ORDER — IOHEXOL 350 MG/ML SOLN
100.0000 mL | Freq: Once | INTRAVENOUS | Status: AC | PRN
  Administered 2014-08-17: 100 mL via INTRAVENOUS

## 2014-08-17 MED ORDER — CLOPIDOGREL BISULFATE 75 MG PO TABS
75.0000 mg | ORAL_TABLET | Freq: Every day | ORAL | Status: DC
  Administered 2014-08-18 – 2014-08-22 (×5): 75 mg via ORAL
  Filled 2014-08-17 (×5): qty 1

## 2014-08-17 MED ORDER — METHYLPREDNISOLONE SODIUM SUCC 125 MG IJ SOLR
60.0000 mg | Freq: Two times a day (BID) | INTRAMUSCULAR | Status: DC
  Administered 2014-08-18 – 2014-08-20 (×5): 60 mg via INTRAVENOUS
  Filled 2014-08-17 (×4): qty 2

## 2014-08-17 MED ORDER — DONEPEZIL HCL 5 MG PO TABS
10.0000 mg | ORAL_TABLET | Freq: Every day | ORAL | Status: DC
  Administered 2014-08-17 – 2014-08-21 (×5): 10 mg via ORAL
  Filled 2014-08-17 (×5): qty 2

## 2014-08-17 MED ORDER — FOLIC ACID 1 MG PO TABS
1.0000 mg | ORAL_TABLET | Freq: Every day | ORAL | Status: DC
  Administered 2014-08-18 – 2014-08-22 (×6): 1 mg via ORAL
  Filled 2014-08-17 (×5): qty 1

## 2014-08-17 MED ORDER — CALCIUM CARBONATE-VITAMIN D 500-200 MG-UNIT PO TABS
1.0000 | ORAL_TABLET | Freq: Every day | ORAL | Status: DC
  Administered 2014-08-18 – 2014-08-22 (×5): 1 via ORAL
  Filled 2014-08-17 (×5): qty 1

## 2014-08-17 MED ORDER — ONDANSETRON HCL 4 MG PO TABS: 4.0000 mg | ORAL_TABLET | Freq: Four times a day (QID) | ORAL | Status: DC | PRN

## 2014-08-17 MED ORDER — HYDROCODONE-ACETAMINOPHEN 5-325 MG PO TABS
1.0000 | ORAL_TABLET | ORAL | Status: DC | PRN
  Administered 2014-08-17 – 2014-08-22 (×6): 1 via ORAL
  Filled 2014-08-17 (×9): qty 1

## 2014-08-17 MED ORDER — ONDANSETRON HCL 4 MG/2ML IJ SOLN: 4.0000 mg | Freq: Four times a day (QID) | INTRAMUSCULAR | Status: DC | PRN

## 2014-08-17 NOTE — H&P (Signed)
[facility] History and Physical  Tiffany Hartman XKG:[id number] DOB: 06-04-1927 DOA: 08/17/2014  Referring physician: Dr. Luan Hartman PCP: Tiffany Fire, MD   Chief Complaint: Dyspnea and right-sided pleuritic chest pain  HPI: Tiffany Hartman is a 81 y.o. female  This is an 81 year old lady who gives a two-week history of intermittent acute dyspnea, usually on exertion and also associated with right-sided pleuritic chest pain. She denies any hemoptysis. She has had a dry cough but no fever. She also describes swelling of both her legs more on the left than the right. She was seen in the office by Dr. Luan Hartman as a consultation by Dr. Legrand Hartman. Dr. Luan Hartman referred the patient to the hospital as he was concerned about the possibility of a pulmonary medicine. She is now being admitted for further investigation and management.  Review of Systems:  Apart from symptoms above, all systems negative.  Past Medical History  Diagnosis Date  . Dementia   . Bronchitis   . Asthma   . Diabetes mellitus   . Parkinson disease   . HTN (hypertension)   . OA (osteoarthritis)   . Thyroid nodule     Dr Tiffany Hartman  . Glaucoma   . Diverticulitis 2009  . Breast cancer 02/20/83    LT mastectomy  . Obesity 08/17/2014   Past Surgical History  Procedure Laterality Date  . Abdominal hysterectomy      complete  . Joint replacement  1998/1999    knee boths  . Mastectomy  02/20/1983    left   . Fracture surgery  2008     right and left femurs  . Appendectomy    .  bilateral catracts    . Eus  09/10/2011    Procedure: UPPER ENDOSCOPIC ULTRASOUND (EUS) RADIAL;  Surgeon: Tiffany Silence, MD;  Location: [facility];  Service: Endoscopy;  Laterality: N/A;  christina/ebp  . Fine needle aspiration  09/10/2011    Procedure: FINE NEEDLE ASPIRATION (FNA) LINEAR;  Surgeon: Tiffany Silence, MD;  Location: [facility];  Service: Endoscopy;  Laterality: N/A;  . Knee surgery Right     TKA  . Knee surgery Left     TKA  . Knee  surgery Right     Periprosthetic fracture Linna Caprice   Social History:  reports that she has never smoked. She has never used smokeless tobacco. She reports that she does not drink alcohol or use illicit drugs.  No Known Allergies  Family History  Problem Relation Age of Onset  . Colon polyps Daughter 67  . Stroke Father   . Huntington's disease Mother   . Throat cancer Son     Prior to Admission medications   Medication Sig Start Date End Date Taking? Authorizing Provider  ALPRAZolam (XANAX) 0.25 MG tablet Take 0.25 mg by mouth 3 (three) times daily as needed for sleep or anxiety.    Historical Provider, MD  amLODipine (NORVASC) 5 MG tablet Take 5 mg by mouth daily.    Historical Provider, MD  Calcium Carbonate-Vitamin D (CALCIUM + D) 600-200 MG-UNIT TABS Take 1 tablet by mouth every evening.     Historical Provider, MD  clopidogrel (PLAVIX) 75 MG tablet Take 1 tablet (75 mg total) by mouth daily with breakfast. 08/16/12   Tiffany Fire, MD  donepezil (ARICEPT) 10 MG tablet Take 10 mg by mouth at bedtime.    Historical Provider, MD  ferrous gluconate (FERGON) 325 MG tablet Take 325 mg by mouth daily with breakfast.    Historical Provider, MD  FLUoxetine (PROZAC) 20 MG capsule Take 20 mg by mouth daily.    Historical Provider, MD  folic acid (FOLVITE) 1 MG tablet Take 1 mg by mouth daily.    Historical Provider, MD  gabapentin (NEURONTIN) 300 MG capsule Take 1 capsule (300 mg total) by mouth 3 (three) times daily. 06/02/12   Carole Civil, MD  HYDROcodone-acetaminophen (NORCO/VICODIN) 5-325 MG per tablet Take 1 tablet by mouth every 4 (four) hours as needed for moderate pain. 06/02/12   Carole Civil, MD  ipratropium (ATROVENT) 0.02 % nebulizer solution Take 2.5 mLs (0.5 mg total) by nebulization 3 (three) times daily. 08/16/12   Tiffany Fire, MD  latanoprost (XALATAN) 0.005 % ophthalmic solution Place 1 drop into both eyes at bedtime.    Historical Provider, MD  levETIRAcetam (KEPPRA)  250 MG tablet Take 250 mg by mouth 2 (two) times daily.    Historical Provider, MD  losartan (COZAAR) 100 MG tablet Take 100 mg by mouth daily.    Historical Provider, MD  lovastatin (MEVACOR) 10 MG tablet Take 10 mg by mouth at bedtime.    Historical Provider, MD  olmesartan-hydrochlorothiazide (BENICAR HCT) 40-12.5 MG per tablet Take 1 tablet by mouth daily.    Historical Provider, MD  omeprazole (PRILOSEC) 20 MG capsule Take 20 mg by mouth daily.    Historical Provider, MD  potassium chloride (K-DUR,KLOR-CON) 10 MEQ tablet Take 10 mEq by mouth daily.    Historical Provider, MD   Physical Exam: Filed Vitals:   08/17/14 1651  BP: 134/54  Pulse: 79  Temp: 97.4 F (36.3 C)  TempSrc: Oral  Resp: 20  Height: 5\' 3"  (1.6 m)  Weight: 101.923 kg (224 lb 11.2 oz)  SpO2: 91%    Wt Readings from Last 3 Encounters:  08/17/14 101.923 kg (224 lb 11.2 oz)  12/27/13 104.327 kg (230 lb)  02/08/13 100.699 kg (222 lb)    General:  Appears somewhat anxious. She does not appear to have increased work of breathing at rest. She is saturating adequately on room air. There is no peripheral central cyanosis. Eyes: PERRL, normal lids, irises & conjunctiva ENT: grossly normal hearing, lips & tongue Neck: no LAD, masses or thyromegaly Cardiovascular: RRR, no m/r/g. No LE edema. Telemetry: SR, no arrhythmias  Respiratory: She does have bilateral wheezing. In particular, there is no pleural rub. Abdomen: soft, ntnd Skin: no rash or induration seen on limited exam Musculoskeletal: grossly normal tone BUE/BLE Psychiatric: grossly normal mood and affect, speech fluent and appropriate Neurologic: grossly non-focal.          Labs on Admission:  Basic Metabolic Panel:  Recent Labs Lab 08/17/14 1718  NA 140  K 3.9  CL 107  CO2 25  GLUCOSE 97  BUN 19  CREATININE 1.14*  CALCIUM 8.8*   Liver Function Tests:  Recent Labs Lab 08/17/14 1718  AST 14*  ALT 12*  ALKPHOS 55  BILITOT 0.5  PROT 6.7    ALBUMIN 3.7   No results for input(s): LIPASE, AMYLASE in the last 168 hours. No results for input(s): AMMONIA in the last 168 hours. CBC:  Recent Labs Lab 08/17/14 1718  WBC 6.4  HGB 11.7*  HCT 36.6  MCV 90.1  PLT 306   Cardiac Enzymes: No results for input(s): CKTOTAL, CKMB, CKMBINDEX, TROPONINI in the last 168 hours.  BNP (last 3 results)  Recent Labs  08/11/14 1630  BNP 51.0    ProBNP (last 3 results) No results for input(s): PROBNP in the  last 8760 hours.  CBG: No results for input(s): GLUCAP in the last 168 hours.  Radiological Exams on Admission: Dg Chest Port 1 View  08/17/2014   CLINICAL DATA:  Shortness of breath, wheezing.  Pain under ribs.  EXAM: PORTABLE CHEST - 1 VIEW  COMPARISON:  07/31/2014  FINDINGS: Heart size is normal. No pleural effusion or edema identified. No airspace consolidation. There are advanced degenerative changes involving both glenohumeral joints. Surgical clips noted within the left axilla.  IMPRESSION: 1. No active cardiopulmonary abnormality.   Electronically Signed   By: Kerby Moors M.D.   On: 08/17/2014 17:29    EKG: Independently reviewed. Sinus rhythm without any acute ST-T wave changes.  Assessment/Plan   1. Right-sided pleuritic chest pain. D-dimer is elevated and the history is certainly suggestive of the possibility of pulmonary embolus in. I will request CT angiogram of the chest this evening. 2. Dyspnea. She does have bronchospasm and wheezing. I will order intravenous steroids empirically to see if this will help her. She does not appear to have any infiltrate or infection on the chest x-ray. 3. Parkinson's disease. 4. Diabetes. Sliding scale insulin. 5. Dementia. Continue home medications.  Further recommendations will depend on patient's hospital progress.   Code Status: Full code.   DVT Prophylaxis: Lovenox.  Family Communication: I discussed the plan with the patient at the bedside.   Disposition Plan:  Home when medically stable.  Time spent: 60 minutes.  Doree Albee [facility] Pager [phone].

## 2014-08-18 LAB — COMPREHENSIVE METABOLIC PANEL
ALT: 10 U/L — ABNORMAL LOW (ref 14–54)
ANION GAP: 7 (ref 5–15)
AST: 12 U/L — AB (ref 15–41)
Albumin: 3.3 g/dL — ABNORMAL LOW (ref 3.5–5.0)
Alkaline Phosphatase: 50 U/L (ref 38–126)
BUN: 19 mg/dL (ref 6–20)
CALCIUM: 8.5 mg/dL — AB (ref 8.9–10.3)
CO2: 24 mmol/L (ref 22–32)
Chloride: 107 mmol/L (ref 101–111)
Creatinine, Ser: 1.19 mg/dL — ABNORMAL HIGH (ref 0.44–1.00)
GFR, EST AFRICAN AMERICAN: 47 mL/min — AB (ref >60)
GFR, EST NON AFRICAN AMERICAN: 40 mL/min — AB (ref >60)
GLUCOSE: 108 mg/dL — AB (ref 65–99)
Potassium: 4.1 mmol/L (ref 3.5–5.1)
Sodium: 138 mmol/L (ref 135–145)
Total Bilirubin: 0.5 mg/dL (ref 0.3–1.2)
Total Protein: 6 g/dL — ABNORMAL LOW (ref 6.5–8.1)

## 2014-08-18 LAB — GLUCOSE, CAPILLARY
GLUCOSE-CAPILLARY: 201 mg/dL — AB (ref 65–99)
GLUCOSE-CAPILLARY: 186 mg/dL — AB (ref 65–99)
Glucose-Capillary: 108 mg/dL — ABNORMAL HIGH (ref 65–99)
Glucose-Capillary: 209 mg/dL — ABNORMAL HIGH (ref 65–99)

## 2014-08-18 LAB — CBC
HCT: 35.6 % — ABNORMAL LOW (ref 36.0–46.0)
Hemoglobin: 11 g/dL — ABNORMAL LOW (ref 12.0–15.0)
MCH: 28.1 pg (ref 26.0–34.0)
MCHC: 30.9 g/dL (ref 30.0–36.0)
MCV: 90.8 fL (ref 78.0–100.0)
Platelets: 291 10*3/uL (ref 150–400)
RBC: 3.92 MIL/uL (ref 3.87–5.11)
RDW: 13.9 % (ref 11.5–15.5)
WBC: 5.3 10*3/uL (ref 4.0–10.5)

## 2014-08-18 MED ORDER — DOXYCYCLINE HYCLATE 100 MG IV SOLR
100.0000 mg | Freq: Two times a day (BID) | INTRAVENOUS | Status: DC
  Administered 2014-08-18 – 2014-08-19 (×4): 100 mg via INTRAVENOUS
  Filled 2014-08-18 (×7): qty 100

## 2014-08-18 MED ORDER — GUAIFENESIN ER 600 MG PO TB12
1200.0000 mg | ORAL_TABLET | Freq: Two times a day (BID) | ORAL | Status: DC
Start: 2014-08-18 — End: 2014-08-22
  Administered 2014-08-18 – 2014-08-22 (×9): 1200 mg via ORAL
  Filled 2014-08-18 (×9): qty 2

## 2014-08-18 MED ORDER — IPRATROPIUM-ALBUTEROL 0.5-2.5 (3) MG/3ML IN SOLN
3.0000 mL | RESPIRATORY_TRACT | Status: DC
  Administered 2014-08-18 – 2014-08-19 (×8): 3 mL via RESPIRATORY_TRACT
  Filled 2014-08-18 (×8): qty 3

## 2014-08-18 NOTE — Care Management Important Message (Signed)
Important Message  Patient Details  Name: Tiffany Hartman MRN: [id number] Date of Birth: [birth year]   Medicare Important Message Given:  Yes-second notification given    [name], RN 08/18/2014, 12:19 PM

## 2014-08-18 NOTE — Plan of Care (Signed)
Problem: Acute Rehab PT Goals(only PT should resolve) Goal: Patient Will Transfer Sit To/From Stand Pt will transfer sit to/from-stand with RW at [place] without loss-of-balance to demonstrate good safety awareness for independent mobility in home.     Goal: Pt Will Ambulate Pt will ambulate with RW at [place] using a step-through pattern and equal step length for a distances greater than 2106ft at 0.67m/s or faster to demonstrate the ability to perform safe household distance ambulation at discharge.

## 2014-08-18 NOTE — Progress Notes (Signed)
Initial Nutrition Assessment  DOCUMENTATION CODES:   Obesity unspecified  INTERVENTION:  Ensure Enlive po BID, each supplement provides 350 kcal and 20 grams of protein   Encourage meal intake   NUTRITION DIAGNOSIS:   Increased nutrient needs related to chronic illness as evidenced by estimated needs.   GOAL:   Patient will meet greater than or equal to 90% of their needs   MONITOR:   PO intake, Supplement acceptance, Labs  REASON FOR ASSESSMENT:   Malnutrition Screening Tool    ASSESSMENT:  Pt has hx of dementia, diabetes , Parkinson's dz. She has been dyspneic for more than a week prior to admission. Her oral intake has been diminished because of the difficulty breathing. Her weight has decreased slightly likely related to her change in regular meal pattern. She says "feeling much better today". She reports very good breakfast intake >/=75%. She has no physical signs of malnutrition at this time. Pt is receiving Ensure to help replete intake deficit and I have recommended that she continue with oral nutrition particularly when she is unable to consume her diet as usual.    Diet Order:  Diet Carb Modified Fluid consistency:: Thin; Room service appropriate?: Yes  Skin:   WDL  Last BM:   8/10  Height:   Ht Readings from Last 1 Encounters:  08/17/14 5\' 3"  (1.6 m)    Weight:   Wt Readings from Last 1 Encounters:  08/17/14 224 lb 11.2 oz (101.923 kg)    Ideal Body Weight:   52.2 kg  BMI:  Body mass index is 39.81 kg/(m^2).  Estimated Nutritional Needs:   Kcal:  1500  Protein:  103 gr  Fluid:  >1500 ml daily  EDUCATION NEEDS:   No education needs identified at this time  Colman Cater MS,RD,CSG,LDN Office: #[phone] Pager: [phone]

## 2014-08-18 NOTE — Care Management Note (Signed)
Case Management Note  Patient Details  Name: JOHNASIA LIESE MRN: [id number] Date of Birth: 1927/04/02  Expected Discharge Date:                  Expected Discharge Plan:  [place]  In-House Referral:  NA  Discharge planning Services  CM Consult  Post Acute Care Choice:  Durable Medical Equipment, Home Health Choice offered to:  Patient  DME Arranged:  Oxygen DME Agency:  [place] Arranged:  RN, PT [facility] Agency:  [place]  Status of Service:  Completed, signed off  Medicare Important Message Given:  Yes-second notification given Date Medicare IM Given:    Medicare IM give by:    Date Additional Medicare IM Given:    Additional Medicare Important Message give by:     If discussed at [place] of Stay Meetings, dates discussed:    Additional Comments: Pt is from home, lives with her daughter and is fairly independent at baseline. Pt has neb machine at home. Pt has walker and wheelchair to use when she leaves the home but does not use anything in the home. Pt has used [facility] in the past and would like them again. Pt will be ordered [facility] RN PT at DC and will have home O2 assessment. Pt would like [facility] for DME needs also. Referral given to [facility] and she will obtain pt info from chart. If pt discharges over weekend, RN will notify [facility] of discharge. No further CM needs.  [name], RN 08/18/2014, 12:20 PM

## 2014-08-18 NOTE — Progress Notes (Signed)
Subjective: This is a patient I saw in my office yesterday for Dr. Legrand Rams who is out of town. She was very dyspneic at rest with a respiratory rate of about 30 in complaining of pleuritic pain on the right side. I sent her to the hospital for admission at that point. I was concerned that she had pulmonary embolus but she did not on her chest CT. Chest CT shows bilateral atelectasis but no pneumonia and no pleural abnormality. This morning she says she feels much better. She is now coughing up purulent sputum. She says yesterday she wasn't able to cough anything up because it hurt too much.  Objective: Vital signs in last 24 hours: Temp:  [97.4 F (36.3 C)-98 F (36.7 C)] 97.7 F (36.5 C) (08/12 0503) Pulse Rate:  [51-79] 51 (08/12 0503) Resp:  [20] 20 (08/12 0503) BP: (118-145)/(43-60) 118/43 mmHg (08/12 0503) SpO2:  [91 %-99 %] 98 % (08/12 0738) Weight:  [101.923 kg (224 lb 11.2 oz)] 101.923 kg (224 lb 11.2 oz) (08/11 1651) Weight change:  Last BM Date: 08/16/14  Intake/Output from previous day: 08/11 0701 - 08/12 0700 In: 480 [P.O.:480] Out: -   PHYSICAL EXAM General appearance: alert, cooperative and mild distress Resp: She has some rhonchi but her chest is much better than yesterday Cardio: regular rate and rhythm, S1, S2 normal, no murmur, click, rub or gallop GI: soft, non-tender; bowel sounds normal; no masses,  no organomegaly Extremities: extremities normal, atraumatic, no cyanosis or edema  Lab Results:  Results for orders placed or performed during the hospital encounter of 08/17/14 (from the past 48 hour(s))  CBC     Status: Abnormal   Collection Time: 08/17/14  5:18 PM  Result Value Ref Range   WBC 6.4 4.0 - 10.5 K/uL   RBC 4.06 3.87 - 5.11 MIL/uL   Hemoglobin 11.7 (L) 12.0 - 15.0 g/dL   HCT 36.6 36.0 - 46.0 %   MCV 90.1 78.0 - 100.0 fL   MCH 28.8 26.0 - 34.0 pg   MCHC 32.0 30.0 - 36.0 g/dL   RDW 13.7 11.5 - 15.5 %   Platelets 306 150 - 400 K/uL  Comprehensive  metabolic panel     Status: Abnormal   Collection Time: 08/17/14  5:18 PM  Result Value Ref Range   Sodium 140 135 - 145 mmol/L   Potassium 3.9 3.5 - 5.1 mmol/L   Chloride 107 101 - 111 mmol/L   CO2 25 22 - 32 mmol/L   Glucose, Bld 97 65 - 99 mg/dL   BUN 19 6 - 20 mg/dL   Creatinine, Ser 1.14 (H) 0.44 - 1.00 mg/dL   Calcium 8.8 (L) 8.9 - 10.3 mg/dL   Total Protein 6.7 6.5 - 8.1 g/dL   Albumin 3.7 3.5 - 5.0 g/dL   AST 14 (L) 15 - 41 U/L   ALT 12 (L) 14 - 54 U/L   Alkaline Phosphatase 55 38 - 126 U/L   Total Bilirubin 0.5 0.3 - 1.2 mg/dL   GFR calc non Af Amer 43 (L) >60 mL/min   GFR calc Af Amer 49 (L) >60 mL/min    Comment: (NOTE) The eGFR has been calculated using the CKD EPI equation. This calculation has not been validated in all clinical situations. eGFR's persistently <60 mL/min signify possible Chronic Kidney Disease.    Anion gap 8 5 - 15  D-dimer, quantitative (not at [facility])     Status: Abnormal   Collection Time: 08/17/14  5:18 PM  Result Value Ref Range   D-Dimer, Quant 0.67 (H) 0.00 - 0.48 ug/mL-FEU    Comment:        AT THE INHOUSE ESTABLISHED CUTOFF VALUE OF 0.48 ug/mL FEU, THIS ASSAY HAS BEEN DOCUMENTED IN THE LITERATURE TO HAVE A SENSITIVITY AND NEGATIVE PREDICTIVE VALUE OF AT LEAST 98 TO 99%.  THE TEST RESULT SHOULD BE CORRELATED WITH AN ASSESSMENT OF THE CLINICAL PROBABILITY OF DVT / VTE.   Glucose, capillary     Status: None   Collection Time: 08/17/14  8:17 PM  Result Value Ref Range   Glucose-Capillary 95 65 - 99 mg/dL   Comment 1 Notify RN    Comment 2 Document in Chart   Comprehensive metabolic panel     Status: Abnormal   Collection Time: 08/18/14  6:10 AM  Result Value Ref Range   Sodium 138 135 - 145 mmol/L   Potassium 4.1 3.5 - 5.1 mmol/L   Chloride 107 101 - 111 mmol/L   CO2 24 22 - 32 mmol/L   Glucose, Bld 108 (H) 65 - 99 mg/dL   BUN 19 6 - 20 mg/dL   Creatinine, Ser 1.19 (H) 0.44 - 1.00 mg/dL   Calcium 8.5 (L) 8.9 - 10.3 mg/dL    Total Protein 6.0 (L) 6.5 - 8.1 g/dL   Albumin 3.3 (L) 3.5 - 5.0 g/dL   AST 12 (L) 15 - 41 U/L   ALT 10 (L) 14 - 54 U/L   Alkaline Phosphatase 50 38 - 126 U/L   Total Bilirubin 0.5 0.3 - 1.2 mg/dL   GFR calc non Af Amer 40 (L) >60 mL/min   GFR calc Af Amer 47 (L) >60 mL/min    Comment: (NOTE) The eGFR has been calculated using the CKD EPI equation. This calculation has not been validated in all clinical situations. eGFR's persistently <60 mL/min signify possible Chronic Kidney Disease.    Anion gap 7 5 - 15  CBC     Status: Abnormal   Collection Time: 08/18/14  6:10 AM  Result Value Ref Range   WBC 5.3 4.0 - 10.5 K/uL   RBC 3.92 3.87 - 5.11 MIL/uL   Hemoglobin 11.0 (L) 12.0 - 15.0 g/dL   HCT 35.6 (L) 36.0 - 46.0 %   MCV 90.8 78.0 - 100.0 fL   MCH 28.1 26.0 - 34.0 pg   MCHC 30.9 30.0 - 36.0 g/dL   RDW 13.9 11.5 - 15.5 %   Platelets 291 150 - 400 K/uL  Glucose, capillary     Status: Abnormal   Collection Time: 08/18/14  7:42 AM  Result Value Ref Range   Glucose-Capillary 108 (H) 65 - 99 mg/dL   Comment 1 Notify RN     ABGS No results for input(s): PHART, PO2ART, TCO2, HCO3 in the last 72 hours.  Invalid input(s): PCO2 CULTURES No results found for this or any previous visit (from the past 240 hour(s)). Studies/Results: Ct Angio Chest Pe W/cm &/or Wo Cm  08/17/2014   CLINICAL DATA:  Pleuritic chest pain and intermittent shortness of breath. History of breast carcinoma.  EXAM: CT ANGIOGRAPHY CHEST WITH CONTRAST  TECHNIQUE: Multidetector CT imaging of the chest was performed using the standard protocol during bolus administration of intravenous contrast. Multiplanar CT image reconstructions and MIPs were obtained to evaluate the vascular anatomy.  CONTRAST:  155m OMNIPAQUE IOHEXOL 350 MG/ML SOLN  COMPARISON:  CT angiogram chest July 16, 2011; chest radiograph August 17, 2014  FINDINGS: There is no demonstrable pulmonary  embolus. There is no thoracic aortic aneurysm or  dissection. There is atherosclerotic change in the aorta as well as in the proximal visualized great vessels bilaterally. No hemodynamically significant obstruction is noted in the visualized great vessels.  There is mild bibasilar atelectatic change. There is no lung edema or consolidation. There is mild upper and lower lobe bronchiectatic change bilaterally.  There is again noted a 2 cm nodular lesion in the right lobe of the thyroid, stable. There is no appreciable thoracic adenopathy. There is calcification in the left anterior descending coronary artery. There is mild left ventricular hypertrophy. Pericardium is not appreciably thickened.  In the visualized upper abdomen, there is atherosclerotic change and moderate for for thrombus in the aorta. Visualized upper abdomen otherwise appears normal.  There is degenerative change in the thoracic spine. There is degenerative change in each shoulder. No blastic or lytic bone lesions.  Review of the MIP images confirms the above findings.  IMPRESSION: No demonstrable pulmonary embolus. Mild bronchiectatic change. Mild bibasilar atelectasis. No edema or consolidation. No adenopathy.  Stable 2 cm nodular lesion in the right lobe of the thyroid.  Foci of coronary artery calcification. Mild left ventricular hypertrophy. Extensive osteoarthritic change in the shoulders.   Electronically Signed   By: Lowella Grip III M.D.   On: 08/17/2014 21:37   Dg Chest Port 1 View  08/17/2014   CLINICAL DATA:  Shortness of breath, wheezing.  Pain under ribs.  EXAM: PORTABLE CHEST - 1 VIEW  COMPARISON:  07/31/2014  FINDINGS: Heart size is normal. No pleural effusion or edema identified. No airspace consolidation. There are advanced degenerative changes involving both glenohumeral joints. Surgical clips noted within the left axilla.  IMPRESSION: 1. No active cardiopulmonary abnormality.   Electronically Signed   By: Kerby Moors M.D.   On: 08/17/2014 17:29    Medications:   Prior to Admission:  Prescriptions prior to admission  Medication Sig Dispense Refill Last Dose  . ALPRAZolam (XANAX) 0.25 MG tablet Take 0.25 mg by mouth 3 (three) times daily as needed for sleep or anxiety.   Taking  . amLODipine (NORVASC) 5 MG tablet Take 5 mg by mouth daily.   Taking  . Calcium Carbonate-Vitamin D (CALCIUM + D) 600-200 MG-UNIT TABS Take 1 tablet by mouth every evening.    Taking  . clopidogrel (PLAVIX) 75 MG tablet Take 1 tablet (75 mg total) by mouth daily with breakfast. 30 tablet 3 Taking  . donepezil (ARICEPT) 10 MG tablet Take 10 mg by mouth at bedtime.   Taking  . ferrous gluconate (FERGON) 325 MG tablet Take 325 mg by mouth daily with breakfast.   Taking  . FLUoxetine (PROZAC) 20 MG capsule Take 20 mg by mouth daily.   Taking  . folic acid (FOLVITE) 1 MG tablet Take 1 mg by mouth daily.   Taking  . gabapentin (NEURONTIN) 300 MG capsule Take 1 capsule (300 mg total) by mouth 3 (three) times daily. 30 capsule 1 Taking  . HYDROcodone-acetaminophen (NORCO/VICODIN) 5-325 MG per tablet Take 1 tablet by mouth every 4 (four) hours as needed for moderate pain.   Taking  . ipratropium (ATROVENT) 0.02 % nebulizer solution Take 2.5 mLs (0.5 mg total) by nebulization 3 (three) times daily. 75 mL 12 Taking  . latanoprost (XALATAN) 0.005 % ophthalmic solution Place 1 drop into both eyes at bedtime.   Taking  . levETIRAcetam (KEPPRA) 250 MG tablet Take 250 mg by mouth 2 (two) times daily.   Taking  .  losartan (COZAAR) 100 MG tablet Take 100 mg by mouth daily.   Taking  . lovastatin (MEVACOR) 10 MG tablet Take 10 mg by mouth at bedtime.   Taking  . olmesartan-hydrochlorothiazide (BENICAR HCT) 40-12.5 MG per tablet Take 1 tablet by mouth daily.     Marland Kitchen omeprazole (PRILOSEC) 20 MG capsule Take 20 mg by mouth daily.   Taking  . potassium chloride (K-DUR,KLOR-CON) 10 MEQ tablet Take 10 mEq by mouth daily.   Taking   Scheduled: . amLODipine  5 mg Oral Daily  . calcium-vitamin D  1  tablet Oral Q breakfast  . clopidogrel  75 mg Oral Q breakfast  . donepezil  10 mg Oral QHS  . doxycycline (VIBRAMYCIN) IV  100 mg Intravenous Q12H  . enoxaparin (LOVENOX) injection  40 mg Subcutaneous Q24H  . feeding supplement (ENSURE ENLIVE)  237 mL Oral BID BM  . ferrous gluconate  325 mg Oral Q breakfast  . FLUoxetine  20 mg Oral Daily  . folic acid  1 mg Oral Daily  . gabapentin  300 mg Oral TID  . guaiFENesin  1,200 mg Oral BID  . hydrochlorothiazide  12.5 mg Oral Daily  . insulin aspart  0-20 Units Subcutaneous TID WC  . insulin aspart  0-5 Units Subcutaneous QHS  . ipratropium-albuterol  3 mL Nebulization Q4H  . irbesartan  300 mg Oral Daily  . latanoprost  1 drop Both Eyes QHS  . levETIRAcetam  250 mg Oral BID  . methylPREDNISolone (SOLU-MEDROL) injection  60 mg Intravenous Q12H  . pantoprazole  40 mg Oral Daily  . potassium chloride  10 mEq Oral Daily  . pravastatin  10 mg Oral q1800  . sodium chloride  3 mL Intravenous Q12H  . sodium chloride  3 mL Intravenous Q12H   Continuous:  [id number], HYDROcodone-acetaminophen, ondansetron **OR** ondansetron (ZOFRAN) IV  Assesment: She says she's not really aware of a diagnosis of COPD but she has been told that she has recurrent or chronic bronchitis. She will need to have a pulmonary function test as an outpatient once she is better. She is markedly improved as far as her shortness of breath is concerned and now is coughing up a lot of purulent sputum.  She had pleuritic chest pain which is better  She has diabetes and her blood sugar is not as well controlled I think from IV steroids  She has significant anxiety when she was so acutely ill  She has Parkinson's disease and this affects her speech but it is stable  She has dementia but at this point seems alert and oriented Active Problems:   Parkinsons disease   Pleuritic chest pain   Dyspnea   Obesity   Diabetes    Plan: Continue oxygen. I've changed her  nebulizer treatments to treat her more for typical exacerbation of COPD. Since she's coughing up purulent sputum I'm going to have her start on an antibiotic. She will continue IV steroids. She will start using a flutter valve and incentive spirometry because of the atelectasis. I think her pleuritic chest pain was probably related to the atelectasis on her CT with some pleural irritation from that.    LOS: 1 day   Nadirah Socorro L 08/18/2014, 8:20 AM

## 2014-08-18 NOTE — Evaluation (Signed)
Physical Therapy Evaluation Patient Details Name: Tiffany Hartman MRN: [id number] DOB: 09-25-1927 Today's Date: 08/18/2014   History of Present Illness  This is an 81 year old lady who gives a two-week history of intermittent acute dyspnea, usually on exertion and also associated with right-sided pleuritic chest pain. She denies any hemoptysis. She has had a dry cough but no fever. She also describes swelling of both her legs more on the left than the right. She was seen in the office by Dr. Luan Pulling as a consultation by Dr. Legrand Rams. Dr. Luan Pulling referred the patient to the hospital as he was concerned about the possibility of a pulmonary medicine.  Clinical Impression  Pt is received semirecumbent in bed upon entry, awake, alert, and willing to participate. No acute distress noted. Pt is A&Ox3 and pleasant. Pt reports zero falls in the last 6 months. Pt strength as screened by functional mobility is moderately weak,demonstrating difficulty c transfers, grip strength is strong. Pt falls risk is high as evidenced by slow gait speed and LOB c perturbation testing. Pt remaining on RA throughout evaluation, but desaturating with activity (92%) as well as mobility (84%), whereas pt is not on O2 at baseline at home. Patient presenting with impairment of strength, balance, oxygen perfusion, and activity tolerance, limiting ability to perform ADL and mobility tasks at  baseline level of function. Patient will benefit from skilled intervention to address the above impairments and limitations, in order to restore to prior level of function, improve patient safety upon discharge, and to decrease falls risk.       Follow Up Recommendations Home health PT    Equipment Recommendations  None recommended by PT    Recommendations for Other Services       Precautions / Restrictions Precautions Precautions: Fall Restrictions Weight Bearing Restrictions: No      Mobility  Bed Mobility Overal bed mobility: Needs  Assistance Bed Mobility: Supine to Sit     Supine to sit: Supervision     General bed mobility comments: Moderate effort, additional time required to perform.   Transfers Overall transfer level: Needs assistance Equipment used: Rolling walker (2 wheeled) Transfers: Sit to/from Stand Sit to Stand: Supervision         General transfer comment: Requires multiple attempts, rocking forward to perform.   Ambulation/Gait Ambulation/Gait assistance: Min guard Ambulation Distance (Feet): 100 Feet Assistive device: Rolling walker (2 wheeled) Gait Pattern/deviations: Decreased step length - left;Decreased step length - right;Wide base of support Gait velocity: 0.69m/s Gait velocity interpretation: <1.8 ft/sec, indicative of risk for recurrent falls General Gait Details: Slow, DOE   Stairs            Wheelchair Mobility    Modified Rankin (Stroke Patients Only)       Balance Overall balance assessment: Needs assistance;Modified Independent         Standing balance support: No upper extremity supported Standing balance-Leahy Scale: Poor Standing balance comment: 1 LOB c post perturbation, denies history of falls in past 6 months.                              Pertinent Vitals/Pain Pain Assessment: 0-10 Pain Score: 0-No pain Pain Location: All chest pain has resolved at eva.     Home Living Family/patient expects to be discharged to:: Private residence Living Arrangements: Children Available Help at Discharge: Family Type of Home: House Home Access: [place]: One level  Home Equipment: [place] - 2 wheels;Walker - 4 wheels;Cane - quad      Prior Function Level of Independence: Independent with assistive device(s);Needs assistance      ADL's / Homemaking Assistance Needed: Does not drive, daughter assists c groceries.         Hand Dominance        Extremity/Trunk Assessment   Upper Extremity Assessment: Overall WFL  for tasks assessed (Strong grip strength )           Lower Extremity Assessment: Generalized weakness      Cervical / Trunk Assessment: Normal  Communication   Communication: No difficulties  Cognition Arousal/Alertness: Awake/alert Behavior During Therapy: WFL for tasks assessed/performed Overall Cognitive Status: Within Functional Limits for tasks assessed                      General Comments      Exercises        Assessment/Plan    PT Assessment Patient needs continued PT services  PT Diagnosis Difficulty walking;Generalized weakness;Other (comment) (unsteadiness on feet)   PT Problem List Decreased strength;Decreased activity tolerance;Decreased balance;Decreased mobility;Cardiopulmonary status limiting activity;Obesity  PT Treatment Interventions Gait training;Functional mobility training;Therapeutic activities;Therapeutic exercise;Balance training   PT Goals (Current goals can be found in the Care Plan section) Acute Rehab PT Goals Patient Stated Goal: Return to home, recover strength.  PT Goal Formulation: With patient Time For Goal Achievement: 09/01/14 Potential to Achieve Goals: Good    Frequency Min 3X/week   Barriers to discharge        Co-evaluation               End of Session Equipment Utilized During Treatment: Gait belt Activity Tolerance: Patient tolerated treatment well Patient left: in bed;with family/visitor present Nurse Communication: Mobility status;Other (comment)         Time: 8657-8469 PT Time Calculation (min) (ACUTE ONLY): 15 min   Charges:   PT Evaluation $Initial PT Evaluation Tier I: 1 Procedure     PT G Codes:        Buccola,Allan C 13-Sep-2014, 9:23 AM 9:25 AM  Etta Grandchild, PT, DPT [place] License # [id number]

## 2014-08-18 NOTE — Progress Notes (Signed)
Respiratory Therapy:  Initiated Chiropodist and Flutter valve; patient had good effort and tolerated well.  Henrine Screws RRT

## 2014-08-19 LAB — GLUCOSE, CAPILLARY
GLUCOSE-CAPILLARY: 182 mg/dL — AB (ref 65–99)
GLUCOSE-CAPILLARY: 137 mg/dL — AB (ref 65–99)
GLUCOSE-CAPILLARY: 180 mg/dL — AB (ref 65–99)
Glucose-Capillary: 168 mg/dL — ABNORMAL HIGH (ref 65–99)

## 2014-08-19 MED ORDER — BISACODYL 10 MG RE SUPP
10.0000 mg | Freq: Every day | RECTAL | Status: DC | PRN
  Administered 2014-08-19: 10 mg via RECTAL
  Filled 2014-08-19: qty 1

## 2014-08-19 MED ORDER — IPRATROPIUM-ALBUTEROL 0.5-2.5 (3) MG/3ML IN SOLN
3.0000 mL | RESPIRATORY_TRACT | Status: DC
  Administered 2014-08-20 – 2014-08-22 (×13): 3 mL via RESPIRATORY_TRACT
  Filled 2014-08-19 (×13): qty 3

## 2014-08-19 MED ORDER — BISACODYL 5 MG PO TBEC
5.0000 mg | DELAYED_RELEASE_TABLET | Freq: Every day | ORAL | Status: DC | PRN
  Administered 2014-08-19 – 2014-08-20 (×2): 5 mg via ORAL
  Filled 2014-08-19 (×2): qty 1

## 2014-08-19 NOTE — Progress Notes (Signed)
Subjective: She has a severe headache this morning. Otherwise she does feel better. She's not having any chest pain. Her breathing has improved.  Objective: Vital signs in last 24 hours: Temp:  [97.5 F (36.4 C)-97.9 F (36.6 C)] 97.5 F (36.4 C) (08/13 0547) Pulse Rate:  [65-81] 75 (08/13 0547) Resp:  [20] 20 (08/13 0547) BP: (125-132)/(45-51) 125/51 mmHg (08/13 0547) SpO2:  [96 %-100 %] 99 % (08/13 0754) Weight change:  Last BM Date: 08/16/14  Intake/Output from previous day: 08/12 0701 - 08/13 0700 In: 500 [IV Piggyback:500] Out: -   PHYSICAL EXAM General appearance: alert, cooperative and mild distress Resp: rhonchi bilaterally Cardio: regular rate and rhythm, S1, S2 normal, no murmur, click, rub or gallop GI: soft, non-tender; bowel sounds normal; no masses,  no organomegaly Extremities: extremities normal, atraumatic, no cyanosis or edema  Lab Results:  Results for orders placed or performed during the hospital encounter of 08/17/14 (from the past 48 hour(s))  CBC     Status: Abnormal   Collection Time: 08/17/14  5:18 PM  Result Value Ref Range   WBC 6.4 4.0 - 10.5 K/uL   RBC 4.06 3.87 - 5.11 MIL/uL   Hemoglobin 11.7 (L) 12.0 - 15.0 g/dL   HCT 36.6 36.0 - 46.0 %   MCV 90.1 78.0 - 100.0 fL   MCH 28.8 26.0 - 34.0 pg   MCHC 32.0 30.0 - 36.0 g/dL   RDW 13.7 11.5 - 15.5 %   Platelets 306 150 - 400 K/uL  Comprehensive metabolic panel     Status: Abnormal   Collection Time: 08/17/14  5:18 PM  Result Value Ref Range   Sodium 140 135 - 145 mmol/L   Potassium 3.9 3.5 - 5.1 mmol/L   Chloride 107 101 - 111 mmol/L   CO2 25 22 - 32 mmol/L   Glucose, Bld 97 65 - 99 mg/dL   BUN 19 6 - 20 mg/dL   Creatinine, Ser 1.14 (H) 0.44 - 1.00 mg/dL   Calcium 8.8 (L) 8.9 - 10.3 mg/dL   Total Protein 6.7 6.5 - 8.1 g/dL   Albumin 3.7 3.5 - 5.0 g/dL   AST 14 (L) 15 - 41 U/L   ALT 12 (L) 14 - 54 U/L   Alkaline Phosphatase 55 38 - 126 U/L   Total Bilirubin 0.5 0.3 - 1.2 mg/dL   GFR  calc non Af Amer 43 (L) >60 mL/min   GFR calc Af Amer 49 (L) >60 mL/min    Comment: (NOTE) The eGFR has been calculated using the CKD EPI equation. This calculation has not been validated in all clinical situations. eGFR's persistently <60 mL/min signify possible Chronic Kidney Disease.    Anion gap 8 5 - 15  D-dimer, quantitative (not at [facility])     Status: Abnormal   Collection Time: 08/17/14  5:18 PM  Result Value Ref Range   D-Dimer, Quant 0.67 (H) 0.00 - 0.48 ug/mL-FEU    Comment:        AT THE INHOUSE ESTABLISHED CUTOFF VALUE OF 0.48 ug/mL FEU, THIS ASSAY HAS BEEN DOCUMENTED IN THE LITERATURE TO HAVE A SENSITIVITY AND NEGATIVE PREDICTIVE VALUE OF AT LEAST 98 TO 99%.  THE TEST RESULT SHOULD BE CORRELATED WITH AN ASSESSMENT OF THE CLINICAL PROBABILITY OF DVT / VTE.   Glucose, capillary     Status: None   Collection Time: 08/17/14  8:17 PM  Result Value Ref Range   Glucose-Capillary 95 65 - 99 mg/dL   Comment 1 Notify  RN    Comment 2 Document in Chart   Comprehensive metabolic panel     Status: Abnormal   Collection Time: 08/18/14  6:10 AM  Result Value Ref Range   Sodium 138 135 - 145 mmol/L   Potassium 4.1 3.5 - 5.1 mmol/L   Chloride 107 101 - 111 mmol/L   CO2 24 22 - 32 mmol/L   Glucose, Bld 108 (H) 65 - 99 mg/dL   BUN 19 6 - 20 mg/dL   Creatinine, Ser 1.19 (H) 0.44 - 1.00 mg/dL   Calcium 8.5 (L) 8.9 - 10.3 mg/dL   Total Protein 6.0 (L) 6.5 - 8.1 g/dL   Albumin 3.3 (L) 3.5 - 5.0 g/dL   AST 12 (L) 15 - 41 U/L   ALT 10 (L) 14 - 54 U/L   Alkaline Phosphatase 50 38 - 126 U/L   Total Bilirubin 0.5 0.3 - 1.2 mg/dL   GFR calc non Af Amer 40 (L) >60 mL/min   GFR calc Af Amer 47 (L) >60 mL/min    Comment: (NOTE) The eGFR has been calculated using the CKD EPI equation. This calculation has not been validated in all clinical situations. eGFR's persistently <60 mL/min signify possible Chronic Kidney Disease.    Anion gap 7 5 - 15  CBC     Status: Abnormal    Collection Time: 08/18/14  6:10 AM  Result Value Ref Range   WBC 5.3 4.0 - 10.5 K/uL   RBC 3.92 3.87 - 5.11 MIL/uL   Hemoglobin 11.0 (L) 12.0 - 15.0 g/dL   HCT 35.6 (L) 36.0 - 46.0 %   MCV 90.8 78.0 - 100.0 fL   MCH 28.1 26.0 - 34.0 pg   MCHC 30.9 30.0 - 36.0 g/dL   RDW 13.9 11.5 - 15.5 %   Platelets 291 150 - 400 K/uL  Glucose, capillary     Status: Abnormal   Collection Time: 08/18/14  7:42 AM  Result Value Ref Range   Glucose-Capillary 108 (H) 65 - 99 mg/dL   Comment 1 Notify RN   Glucose, capillary     Status: Abnormal   Collection Time: 08/18/14 12:10 PM  Result Value Ref Range   Glucose-Capillary 201 (H) 65 - 99 mg/dL   Comment 1 Notify RN   Glucose, capillary     Status: Abnormal   Collection Time: 08/18/14  4:43 PM  Result Value Ref Range   Glucose-Capillary 186 (H) 65 - 99 mg/dL   Comment 1 Notify RN   Glucose, capillary     Status: Abnormal   Collection Time: 08/18/14  9:44 PM  Result Value Ref Range   Glucose-Capillary 209 (H) 65 - 99 mg/dL  Glucose, capillary     Status: Abnormal   Collection Time: 08/19/14  7:18 AM  Result Value Ref Range   Glucose-Capillary 182 (H) 65 - 99 mg/dL   Comment 1 Notify RN     ABGS No results for input(s): PHART, PO2ART, TCO2, HCO3 in the last 72 hours.  Invalid input(s): PCO2 CULTURES No results found for this or any previous visit (from the past 240 hour(s)). Studies/Results: Ct Angio Chest Pe W/cm &/or Wo Cm  08/17/2014   CLINICAL DATA:  Pleuritic chest pain and intermittent shortness of breath. History of breast carcinoma.  EXAM: CT ANGIOGRAPHY CHEST WITH CONTRAST  TECHNIQUE: Multidetector CT imaging of the chest was performed using the standard protocol during bolus administration of intravenous contrast. Multiplanar CT image reconstructions and MIPs were obtained to evaluate  the vascular anatomy.  CONTRAST:  1100m OMNIPAQUE IOHEXOL 350 MG/ML SOLN  COMPARISON:  CT angiogram chest July 16, 2011; chest radiograph August 17, 2014  FINDINGS: There is no demonstrable pulmonary embolus. There is no thoracic aortic aneurysm or dissection. There is atherosclerotic change in the aorta as well as in the proximal visualized great vessels bilaterally. No hemodynamically significant obstruction is noted in the visualized great vessels.  There is mild bibasilar atelectatic change. There is no lung edema or consolidation. There is mild upper and lower lobe bronchiectatic change bilaterally.  There is again noted a 2 cm nodular lesion in the right lobe of the thyroid, stable. There is no appreciable thoracic adenopathy. There is calcification in the left anterior descending coronary artery. There is mild left ventricular hypertrophy. Pericardium is not appreciably thickened.  In the visualized upper abdomen, there is atherosclerotic change and moderate for for thrombus in the aorta. Visualized upper abdomen otherwise appears normal.  There is degenerative change in the thoracic spine. There is degenerative change in each shoulder. No blastic or lytic bone lesions.  Review of the MIP images confirms the above findings.  IMPRESSION: No demonstrable pulmonary embolus. Mild bronchiectatic change. Mild bibasilar atelectasis. No edema or consolidation. No adenopathy.  Stable 2 cm nodular lesion in the right lobe of the thyroid.  Foci of coronary artery calcification. Mild left ventricular hypertrophy. Extensive osteoarthritic change in the shoulders.   Electronically Signed   By: WLowella GripIII M.D.   On: 08/17/2014 21:37   Dg Chest Port 1 View  08/17/2014   CLINICAL DATA:  Shortness of breath, wheezing.  Pain under ribs.  EXAM: PORTABLE CHEST - 1 VIEW  COMPARISON:  07/31/2014  FINDINGS: Heart size is normal. No pleural effusion or edema identified. No airspace consolidation. There are advanced degenerative changes involving both glenohumeral joints. Surgical clips noted within the left axilla.  IMPRESSION: 1. No active cardiopulmonary  abnormality.   Electronically Signed   By: TKerby MoorsM.D.   On: 08/17/2014 17:29    Medications:  Prior to Admission:  Prescriptions prior to admission  Medication Sig Dispense Refill Last Dose  . ALPRAZolam (XANAX) 0.25 MG tablet Take 0.25 mg by mouth 3 (three) times daily as needed for sleep or anxiety.   Past Week at Unknown time  . amLODipine (NORVASC) 5 MG tablet Take 5 mg by mouth daily.   08/17/2014 at Unknown time  . Calcium Carbonate-Vitamin D (CALCIUM + D) 600-200 MG-UNIT TABS Take 1 tablet by mouth every evening.    08/17/2014 at Unknown time  . clopidogrel (PLAVIX) 75 MG tablet Take 1 tablet (75 mg total) by mouth daily with breakfast. 30 tablet 3 08/17/2014 at Unknown time  . donepezil (ARICEPT) 10 MG tablet Take 10 mg by mouth at bedtime.   Past Week at Unknown time  . ferrous gluconate (FERGON) 325 MG tablet Take 325 mg by mouth daily with breakfast.   08/17/2014 at Unknown time  . FLUoxetine (PROZAC) 20 MG capsule Take 20 mg by mouth daily.   08/17/2014 at Unknown time  . folic acid (FOLVITE) 1 MG tablet Take 1 mg by mouth daily.   08/17/2014 at Unknown time  . gabapentin (NEURONTIN) 300 MG capsule Take 1 capsule (300 mg total) by mouth 3 (three) times daily. 30 capsule 1 08/17/2014 at Unknown time  . HYDROcodone-acetaminophen (NORCO/VICODIN) 5-325 MG per tablet Take 1 tablet by mouth every 4 (four) hours as needed for moderate pain.   Past Week  at Unknown time  . ipratropium (ATROVENT) 0.02 % nebulizer solution Take 2.5 mLs (0.5 mg total) by nebulization 3 (three) times daily. 75 mL 12 08/17/2014 at Unknown time  . latanoprost (XALATAN) 0.005 % ophthalmic solution Place 1 drop into both eyes at bedtime.   Past Week at Unknown time  . levETIRAcetam (KEPPRA) 250 MG tablet Take 250 mg by mouth 2 (two) times daily.   08/17/2014 at Unknown time  . losartan (COZAAR) 100 MG tablet Take 100 mg by mouth daily.   08/17/2014 at Unknown time  . lovastatin (MEVACOR) 10 MG tablet Take 10 mg by  mouth at bedtime.   Past Week at Unknown time  . olmesartan-hydrochlorothiazide (BENICAR HCT) 40-12.5 MG per tablet Take 1 tablet by mouth daily.   08/17/2014 at Unknown time  . omeprazole (PRILOSEC) 20 MG capsule Take 20 mg by mouth daily.   08/17/2014 at Unknown time  . potassium chloride (K-DUR,KLOR-CON) 10 MEQ tablet Take 10 mEq by mouth daily.   08/17/2014 at Unknown time   Scheduled: . amLODipine  5 mg Oral Daily  . calcium-vitamin D  1 tablet Oral Q breakfast  . clopidogrel  75 mg Oral Q breakfast  . donepezil  10 mg Oral QHS  . doxycycline (VIBRAMYCIN) IV  100 mg Intravenous Q12H  . enoxaparin (LOVENOX) injection  40 mg Subcutaneous Q24H  . feeding supplement (ENSURE ENLIVE)  237 mL Oral BID BM  . ferrous gluconate  325 mg Oral Q breakfast  . FLUoxetine  20 mg Oral Daily  . folic acid  1 mg Oral Daily  . gabapentin  300 mg Oral TID  . guaiFENesin  1,200 mg Oral BID  . hydrochlorothiazide  12.5 mg Oral Daily  . insulin aspart  0-20 Units Subcutaneous TID WC  . insulin aspart  0-5 Units Subcutaneous QHS  . ipratropium-albuterol  3 mL Nebulization Q4H  . irbesartan  300 mg Oral Daily  . latanoprost  1 drop Both Eyes QHS  . levETIRAcetam  250 mg Oral BID  . methylPREDNISolone (SOLU-MEDROL) injection  60 mg Intravenous Q12H  . pantoprazole  40 mg Oral Daily  . potassium chloride  10 mEq Oral Daily  . pravastatin  10 mg Oral q1800  . sodium chloride  3 mL Intravenous Q12H  . sodium chloride  3 mL Intravenous Q12H   Continuous:  [id number], bisacodyl, bisacodyl, HYDROcodone-acetaminophen, ondansetron **OR** ondansetron (ZOFRAN) IV  Assesment: She is being treated for COPD exacerbation. She seems to have improved significantly. She wants to try off oxygen and see how she does. She has a severe headache however. Her pleuritic chest pain has resolved. Active Problems:   Parkinsons disease   Pleuritic chest pain   Dyspnea   Obesity   Diabetes    Plan: Continue current  treatments. She is on IV steroids IV antibiotics etc. See how she does off oxygen. Continue nebulizer treatments.    LOS: 2 days   Royce Sciara L 08/19/2014, 10:36 AM

## 2014-08-20 LAB — GLUCOSE, CAPILLARY
Glucose-Capillary: 150 mg/dL — ABNORMAL HIGH (ref 65–99)
Glucose-Capillary: 173 mg/dL — ABNORMAL HIGH (ref 65–99)
Glucose-Capillary: 193 mg/dL — ABNORMAL HIGH (ref 65–99)
Glucose-Capillary: 177 mg/dL — ABNORMAL HIGH (ref 65–99)

## 2014-08-20 MED ORDER — PREDNISONE 20 MG PO TABS
40.0000 mg | ORAL_TABLET | Freq: Every day | ORAL | Status: DC
  Administered 2014-08-20 – 2014-08-22 (×3): 40 mg via ORAL
  Filled 2014-08-20 (×3): qty 2

## 2014-08-20 MED ORDER — DOXYCYCLINE HYCLATE 100 MG PO TABS
100.0000 mg | ORAL_TABLET | Freq: Two times a day (BID) | ORAL | Status: DC
  Administered 2014-08-20 – 2014-08-22 (×5): 100 mg via ORAL
  Filled 2014-08-20 (×5): qty 1

## 2014-08-20 NOTE — Progress Notes (Signed)
Subjective: She says she's feeling better. She did not have a good day yesterday. She had some trouble with headache shortness of breath and dizziness. Her IV has infiltrated. She is still not coughing anything up  Objective: Vital signs in last 24 hours: Temp:  [97.6 F (36.4 C)-99.3 F (37.4 C)] 97.8 F (36.6 C) (08/14 0700) Pulse Rate:  [73-79] 79 (08/14 0700) Resp:  [19-20] 19 (08/14 0700) BP: (129-141)/(52-64) 129/64 mmHg (08/14 0700) SpO2:  [86 %-100 %] 96 % (08/14 0740) FiO2 (%):  [21 %] 21 % (08/13 1231) Weight change:  Last BM Date: 08/19/14  Intake/Output from previous day: 08/13 0701 - 08/14 0700 In: 240 [P.O.:240] Out: 300 [Urine:300]  PHYSICAL EXAM General appearance: alert, cooperative and mild distress Resp: She has bilaterally diminished breath sounds and prolonged expiratory phase Cardio: regular rate and rhythm, S1, S2 normal, no murmur, click, rub or gallop GI: soft, non-tender; bowel sounds normal; no masses,  no organomegaly Extremities: extremities normal, atraumatic, no cyanosis or edema  Lab Results:  Results for orders placed or performed during the hospital encounter of 08/17/14 (from the past 48 hour(s))  Glucose, capillary     Status: Abnormal   Collection Time: 08/18/14 12:10 PM  Result Value Ref Range   Glucose-Capillary 201 (H) 65 - 99 mg/dL   Comment 1 Notify RN   Glucose, capillary     Status: Abnormal   Collection Time: 08/18/14  4:43 PM  Result Value Ref Range   Glucose-Capillary 186 (H) 65 - 99 mg/dL   Comment 1 Notify RN   Glucose, capillary     Status: Abnormal   Collection Time: 08/18/14  9:44 PM  Result Value Ref Range   Glucose-Capillary 209 (H) 65 - 99 mg/dL  Glucose, capillary     Status: Abnormal   Collection Time: 08/19/14  7:18 AM  Result Value Ref Range   Glucose-Capillary 182 (H) 65 - 99 mg/dL   Comment 1 Notify RN   Glucose, capillary     Status: Abnormal   Collection Time: 08/19/14 11:47 AM  Result Value Ref Range    Glucose-Capillary 137 (H) 65 - 99 mg/dL   Comment 1 Notify RN   Glucose, capillary     Status: Abnormal   Collection Time: 08/19/14  4:40 PM  Result Value Ref Range   Glucose-Capillary 168 (H) 65 - 99 mg/dL   Comment 1 Notify RN    Comment 2 Document in Chart   Glucose, capillary     Status: Abnormal   Collection Time: 08/19/14  9:10 PM  Result Value Ref Range   Glucose-Capillary 180 (H) 65 - 99 mg/dL   Comment 1 Notify RN    Comment 2 Document in Chart   Glucose, capillary     Status: Abnormal   Collection Time: 08/20/14  7:31 AM  Result Value Ref Range   Glucose-Capillary 150 (H) 65 - 99 mg/dL   Comment 1 Notify RN     ABGS No results for input(s): PHART, PO2ART, TCO2, HCO3 in the last 72 hours.  Invalid input(s): PCO2 CULTURES No results found for this or any previous visit (from the past 240 hour(s)). Studies/Results: No results found.  Medications:  Prior to Admission:  Prescriptions prior to admission  Medication Sig Dispense Refill Last Dose  . ALPRAZolam (XANAX) 0.25 MG tablet Take 0.25 mg by mouth 3 (three) times daily as needed for sleep or anxiety.   Past Week at Unknown time  . amLODipine (NORVASC) 5 MG tablet  Take 5 mg by mouth daily.   08/17/2014 at Unknown time  . Calcium Carbonate-Vitamin D (CALCIUM + D) 600-200 MG-UNIT TABS Take 1 tablet by mouth every evening.    08/17/2014 at Unknown time  . clopidogrel (PLAVIX) 75 MG tablet Take 1 tablet (75 mg total) by mouth daily with breakfast. 30 tablet 3 08/17/2014 at Unknown time  . donepezil (ARICEPT) 10 MG tablet Take 10 mg by mouth at bedtime.   Past Week at Unknown time  . ferrous gluconate (FERGON) 325 MG tablet Take 325 mg by mouth daily with breakfast.   08/17/2014 at Unknown time  . FLUoxetine (PROZAC) 20 MG capsule Take 20 mg by mouth daily.   08/17/2014 at Unknown time  . folic acid (FOLVITE) 1 MG tablet Take 1 mg by mouth daily.   08/17/2014 at Unknown time  . gabapentin (NEURONTIN) 300 MG capsule Take 1  capsule (300 mg total) by mouth 3 (three) times daily. 30 capsule 1 08/17/2014 at Unknown time  . HYDROcodone-acetaminophen (NORCO/VICODIN) 5-325 MG per tablet Take 1 tablet by mouth every 4 (four) hours as needed for moderate pain.   Past Week at Unknown time  . ipratropium (ATROVENT) 0.02 % nebulizer solution Take 2.5 mLs (0.5 mg total) by nebulization 3 (three) times daily. 75 mL 12 08/17/2014 at Unknown time  . latanoprost (XALATAN) 0.005 % ophthalmic solution Place 1 drop into both eyes at bedtime.   Past Week at Unknown time  . levETIRAcetam (KEPPRA) 250 MG tablet Take 250 mg by mouth 2 (two) times daily.   08/17/2014 at Unknown time  . losartan (COZAAR) 100 MG tablet Take 100 mg by mouth daily.   08/17/2014 at Unknown time  . lovastatin (MEVACOR) 10 MG tablet Take 10 mg by mouth at bedtime.   Past Week at Unknown time  . olmesartan-hydrochlorothiazide (BENICAR HCT) 40-12.5 MG per tablet Take 1 tablet by mouth daily.   08/17/2014 at Unknown time  . omeprazole (PRILOSEC) 20 MG capsule Take 20 mg by mouth daily.   08/17/2014 at Unknown time  . potassium chloride (K-DUR,KLOR-CON) 10 MEQ tablet Take 10 mEq by mouth daily.   08/17/2014 at Unknown time   Scheduled: . amLODipine  5 mg Oral Daily  . calcium-vitamin D  1 tablet Oral Q breakfast  . clopidogrel  75 mg Oral Q breakfast  . donepezil  10 mg Oral QHS  . doxycycline  100 mg Oral Q12H  . enoxaparin (LOVENOX) injection  40 mg Subcutaneous Q24H  . feeding supplement (ENSURE ENLIVE)  237 mL Oral BID BM  . ferrous gluconate  325 mg Oral Q breakfast  . FLUoxetine  20 mg Oral Daily  . folic acid  1 mg Oral Daily  . gabapentin  300 mg Oral TID  . guaiFENesin  1,200 mg Oral BID  . hydrochlorothiazide  12.5 mg Oral Daily  . insulin aspart  0-20 Units Subcutaneous TID WC  . insulin aspart  0-5 Units Subcutaneous QHS  . ipratropium-albuterol  3 mL Nebulization Q4H WA  . irbesartan  300 mg Oral Daily  . latanoprost  1 drop Both Eyes QHS  .  levETIRAcetam  250 mg Oral BID  . pantoprazole  40 mg Oral Daily  . potassium chloride  10 mEq Oral Daily  . pravastatin  10 mg Oral q1800  . predniSONE  40 mg Oral Q breakfast  . sodium chloride  3 mL Intravenous Q12H  . sodium chloride  3 mL Intravenous Q12H   Continuous:  BZM:[id number], bisacodyl, bisacodyl, HYDROcodone-acetaminophen, ondansetron **OR** ondansetron (ZOFRAN) IV  Assesment: She was admitted with pleuritic chest pain and acute shortness of breath. She has improved. Her IV has infiltrated strong going to switch her to oral medications. I think if she does okay with this she probably could go home tomorrow. I don't think it's entirely clear why she's had some much trouble with shortness of breath. She does have diastolic heart failure based on her echocardiogram done about 2 weeks ago. She will need to have outpatient pulmonary function testing. Before she goes home and like her to get up and move around and make sure she is able to do that. Active Problems:   Parkinsons disease   Pleuritic chest pain   Dyspnea   Obesity   Diabetes    Plan: Switch to by mouth medications. Get her up and moving. See if she needs oxygen with ambulation. Probable discharge in the morning    LOS: 3 days   Khalfani Weideman L 08/20/2014, 10:08 AM

## 2014-08-21 LAB — GLUCOSE, CAPILLARY
Glucose-Capillary: 115 mg/dL — ABNORMAL HIGH (ref 65–99)
Glucose-Capillary: 148 mg/dL — ABNORMAL HIGH (ref 65–99)
Glucose-Capillary: 193 mg/dL — ABNORMAL HIGH (ref 65–99)
Glucose-Capillary: 240 mg/dL — ABNORMAL HIGH (ref 65–99)

## 2014-08-21 NOTE — Progress Notes (Signed)
PT Cancellation Note  Patient Details Name: Tiffany Hartman MRN: [id number] DOB: [birth year]   Cancelled Treatment:    Reason Eval/Treat Not Completed: Medical issues which prohibited therapy.  Pt c/o chest pain, SOB and general malaise.  She stated that she was sitting at the EOB and began to feel feint so she laid back down.  Her BP was 119/52, O2 sat=100%.  RN is aware.  Pt declines PT.   Sable Feil 08/21/2014, 2:27 PM

## 2014-08-21 NOTE — Progress Notes (Signed)
Subjective: Patient was admitted due to cough and shortness of breath. She was treated for COPD. She is doing better. No history  Of COPD or tobacco smoking in the past. She feels better but still coughing, weak and has difficulty to ambulate.  Objective: Vital signs in last 24 hours: Temp:  [97.6 F (36.4 C)-98.4 F (36.9 C)] 98.4 F (36.9 C) (08/15 0551) Pulse Rate:  [72-81] 80 (08/15 0551) Resp:  [18-20] 18 (08/15 0551) BP: (123-152)/(60-76) 123/76 mmHg (08/15 0551) SpO2:  [98 %-100 %] 98 % (08/15 0551) Weight change:  Last BM Date: 08/20/14  Intake/Output from previous day:    PHYSICAL EXAM General appearance: alert and no distress Resp: diminished breath sounds bilaterally and rhonchi bilaterally Cardio: S1, S2 normal GI: soft, non-tender; bowel sounds normal; no masses,  no organomegaly Extremities: tremors  Lab Results:  Results for orders placed or performed during the hospital encounter of 08/17/14 (from the past 48 hour(s))  Glucose, capillary     Status: Abnormal   Collection Time: 08/19/14 11:47 AM  Result Value Ref Range   Glucose-Capillary 137 (H) 65 - 99 mg/dL   Comment 1 Notify RN   Glucose, capillary     Status: Abnormal   Collection Time: 08/19/14  4:40 PM  Result Value Ref Range   Glucose-Capillary 168 (H) 65 - 99 mg/dL   Comment 1 Notify RN    Comment 2 Document in Chart   Glucose, capillary     Status: Abnormal   Collection Time: 08/19/14  9:10 PM  Result Value Ref Range   Glucose-Capillary 180 (H) 65 - 99 mg/dL   Comment 1 Notify RN    Comment 2 Document in Chart   Glucose, capillary     Status: Abnormal   Collection Time: 08/20/14  7:31 AM  Result Value Ref Range   Glucose-Capillary 150 (H) 65 - 99 mg/dL   Comment 1 Notify RN   Glucose, capillary     Status: Abnormal   Collection Time: 08/20/14 11:33 AM  Result Value Ref Range   Glucose-Capillary 173 (H) 65 - 99 mg/dL   Comment 1 Notify RN   Glucose, capillary     Status: Abnormal   Collection Time: 08/20/14  4:46 PM  Result Value Ref Range   Glucose-Capillary 193 (H) 65 - 99 mg/dL   Comment 1 Notify RN   Glucose, capillary     Status: Abnormal   Collection Time: 08/20/14 10:29 PM  Result Value Ref Range   Glucose-Capillary 177 (H) 65 - 99 mg/dL  Glucose, capillary     Status: Abnormal   Collection Time: 08/21/14  7:27 AM  Result Value Ref Range   Glucose-Capillary 115 (H) 65 - 99 mg/dL    ABGS No results for input(s): PHART, PO2ART, TCO2, HCO3 in the last 72 hours.  Invalid input(s): PCO2 CULTURES No results found for this or any previous visit (from the past 240 hour(s)). Studies/Results: No results found.  Medications: I have reviewed the patient's current medications.  Assesment:   Active Problems:   Parkinsons disease   Pleuritic chest pain   Dyspnea   Obesity   Diabetes ?COPD   Plan:  Medications reviewed Will continue oral steroid and nebulizer Will do cbc/bmp/   Physical therapy    LOS: 4 days   Tiffany Hartman 08/21/2014, 8:20 AM

## 2014-08-22 LAB — URINALYSIS, ROUTINE W REFLEX MICROSCOPIC
BILIRUBIN URINE: NEGATIVE
Glucose, UA: NEGATIVE mg/dL
Hgb urine dipstick: NEGATIVE
Ketones, ur: NEGATIVE mg/dL
Leukocytes, UA: NEGATIVE
NITRITE: NEGATIVE
PROTEIN: NEGATIVE mg/dL
SPECIFIC GRAVITY, URINE: 1.01 (ref 1.005–1.030)
UROBILINOGEN UA: 0.2 mg/dL (ref 0.0–1.0)
pH: 6 (ref 5.0–8.0)

## 2014-08-22 LAB — BASIC METABOLIC PANEL
ANION GAP: 5 (ref 5–15)
BUN: 28 mg/dL — ABNORMAL HIGH (ref 6–20)
CALCIUM: 9.1 mg/dL (ref 8.9–10.3)
CO2: 27 mmol/L (ref 22–32)
CREATININE: 1.13 mg/dL — AB (ref 0.44–1.00)
Chloride: 107 mmol/L (ref 101–111)
GFR calc Af Amer: 50 mL/min — ABNORMAL LOW (ref >60)
GFR calc non Af Amer: 43 mL/min — ABNORMAL LOW (ref >60)
GLUCOSE: 145 mg/dL — AB (ref 65–99)
Potassium: 4 mmol/L (ref 3.5–5.1)
Sodium: 139 mmol/L (ref 135–145)

## 2014-08-22 LAB — CBC
HEMATOCRIT: 38 % (ref 36.0–46.0)
Hemoglobin: 12.2 g/dL (ref 12.0–15.0)
MCH: 28.6 pg (ref 26.0–34.0)
MCHC: 32.1 g/dL (ref 30.0–36.0)
MCV: 89.2 fL (ref 78.0–100.0)
Platelets: 311 10*3/uL (ref 150–400)
RBC: 4.26 MIL/uL (ref 3.87–5.11)
RDW: 14.4 % (ref 11.5–15.5)
WBC: 10.8 10*3/uL — ABNORMAL HIGH (ref 4.0–10.5)

## 2014-08-22 LAB — GLUCOSE, CAPILLARY
GLUCOSE-CAPILLARY: 116 mg/dL — AB (ref 65–99)
Glucose-Capillary: 112 mg/dL — ABNORMAL HIGH (ref 65–99)

## 2014-08-22 MED ORDER — IPRATROPIUM-ALBUTEROL 0.5-2.5 (3) MG/3ML IN SOLN: 3.0000 mL | RESPIRATORY_TRACT | Status: DC

## 2014-08-22 MED ORDER — PREDNISONE 10 MG PO TABS: ORAL_TABLET | ORAL | Status: DC

## 2014-08-22 MED ORDER — DOXYCYCLINE HYCLATE 100 MG PO TABS: 100.0000 mg | ORAL_TABLET | Freq: Two times a day (BID) | ORAL | Status: DC

## 2014-08-22 NOTE — Care Management Note (Addendum)
Case Management Note  Patient Details  Name: Tiffany Hartman MRN: [id number] Date of Birth: [birth year]  Expected Discharge Date:   08/22/2014               Expected Discharge Plan:  [place]  In-House Referral:  NA  Discharge planning Services  CM Consult  Post Acute Care Choice:  Durable Medical Equipment, Home Health Choice offered to:  Patient  DME Arranged:  Oxygen DME Agency:  [place] Arranged:  RN, PT [facility] Agency:  [facility]  Status of Service:  Completed, signed off  Medicare Important Message Given:  Yes-third notification given Date Medicare IM Given:    Medicare IM give by:    Date Additional Medicare IM Given:    Additional Medicare Important Message give by:     If discussed at [place] of Stay Meetings, dates discussed:    Additional Comments: Patient discharging home today with [facility] RN, PT through [facility]. [facility] is aware of discharge and will obtain pt info from chart. Pt does not meet home O2 requirements.  [name], RN 08/22/2014, 2:42 PM

## 2014-08-22 NOTE — Progress Notes (Signed)
Pt requested jello at 2230. Another pt called at the same time regarding IV burning and pt couldn't stand it. Nurse went into other pts room to stop IV and place new IV.  Dody, NT offered jello at 2330. Pt refused jello. Pt c/o headache. Pain med offered; pt refused pain med. Pt very upset, stated, "I want to talk to the supervisor". Charge nurse notified. Charge nurse went into pts room and talked with pt. Pt stated, "I don't need anything at this time" and "everything is okay". Nurse continues to check on pt and will continue to throughout night. Bed remains in lowest position and call bell is within reach.

## 2014-08-22 NOTE — Care Management Important Message (Signed)
Important Message  Patient Details  Name: Tiffany Hartman MRN: [id number] Date of Birth: September 06, 1927   Medicare Important Message Given:  Yes-third notification given    [name], RN 08/22/2014, 10:02 AM

## 2014-08-22 NOTE — Progress Notes (Signed)
Instructions given on medications,and follow up visits,patient,and family verbalized understanding. Prescriptions sent to Pharmacy of choice documented on AVS. Vital signs stable. Accompanied by staff to an awaiting vehicle.

## 2014-08-22 NOTE — Discharge Summary (Addendum)
Physician Discharge Summary  Patient ID: Tiffany Hartman MRN: [id number] DOB/AGE: 1927/04/17 81 y.o. Primary Care Physician:Raysean Graumann, MD Admit date: 08/17/2014 Discharge date: 08/22/2014    Discharge Diagnoses:   Active Problems:   Parkinsons disease   Pleuritic chest pain   Dyspnea   Obesity   Diabetes COPD Exacerbation    Medication List    STOP taking these medications        ipratropium 0.02 % nebulizer solution  Commonly known as:  ATROVENT     olmesartan-hydrochlorothiazide 40-12.5 MG per tablet  Commonly known as:  BENICAR HCT     potassium chloride 10 MEQ tablet  Commonly known as:  K-DUR,KLOR-CON      TAKE these medications        ALPRAZolam 0.25 MG tablet  Commonly known as:  XANAX  Take 0.25 mg by mouth 3 (three) times daily as needed for sleep or anxiety.     amLODipine 5 MG tablet  Commonly known as:  NORVASC  Take 5 mg by mouth daily.     Calcium Carbonate-Vitamin D 600-200 MG-UNIT Tabs  Take 1 tablet by mouth every evening.     clopidogrel 75 MG tablet  Commonly known as:  PLAVIX  Take 1 tablet (75 mg total) by mouth daily with breakfast.     donepezil 10 MG tablet  Commonly known as:  ARICEPT  Take 10 mg by mouth at bedtime.     doxycycline 100 MG tablet  Commonly known as:  VIBRA-TABS  Take 1 tablet (100 mg total) by mouth every 12 (twelve) hours.     ferrous gluconate 325 MG tablet  Commonly known as:  FERGON  Take 325 mg by mouth daily with breakfast.     FLUoxetine 20 MG capsule  Commonly known as:  PROZAC  Take 20 mg by mouth daily.     folic acid 1 MG tablet  Commonly known as:  FOLVITE  Take 1 mg by mouth daily.     gabapentin 300 MG capsule  Commonly known as:  NEURONTIN  Take 1 capsule (300 mg total) by mouth 3 (three) times daily.     HYDROcodone-acetaminophen 5-325 MG per tablet  Commonly known as:  NORCO/VICODIN  Take 1 tablet by mouth every 4 (four) hours as needed for moderate pain.     ipratropium-albuterol  0.5-2.5 (3) MG/3ML Soln  Commonly known as:  DUONEB  Take 3 mLs by nebulization every 4 (four) hours.     latanoprost 0.005 % ophthalmic solution  Commonly known as:  XALATAN  Place 1 drop into both eyes at bedtime.     levETIRAcetam 250 MG tablet  Commonly known as:  KEPPRA  Take 250 mg by mouth 2 (two) times daily.     losartan 100 MG tablet  Commonly known as:  COZAAR  Take 100 mg by mouth daily.     lovastatin 10 MG tablet  Commonly known as:  MEVACOR  Take 10 mg by mouth at bedtime.     omeprazole 20 MG capsule  Commonly known as:  PRILOSEC  Take 20 mg by mouth daily.     predniSONE 10 MG tablet  Commonly known as:  DELTASONE  40 mg po daily for 3  Days, 30 mg po daily for 3 days, 20 mg po daily for 3 days, 10 mg po daily for 3 days.        Discharged Condition: improved    Consults: pulmonary  Significant Diagnostic Studies: Dg Chest 2 View  07/31/2014   CLINICAL DATA:  Two weeks of nonproductive cough, shortness of breath for the past 4 days, history of asthma -bronchitis, and diabetes ; history of breast malignancy and left mastectomy.  EXAM: CHEST  2 VIEW  COMPARISON:  PA and lateral chest of August 10, 2012  FINDINGS: The lungs are well-expanded and clear. The heart and pulmonary vascularity are normal. The mediastinum is normal in width. There is no pleural effusion or pneumothorax. There is multilevel degenerative disc disease of the thoracic spine. There is moderate degenerative change of the right shoulder and severe degenerative change of the left shoulder.  IMPRESSION: There is no active cardiopulmonary disease.   Electronically Signed   By: David  Martinique M.D.   On: 07/31/2014 12:56   Ct Angio Chest Pe W/cm &/or Wo Cm  08/17/2014   CLINICAL DATA:  Pleuritic chest pain and intermittent shortness of breath. History of breast carcinoma.  EXAM: CT ANGIOGRAPHY CHEST WITH CONTRAST  TECHNIQUE: Multidetector CT imaging of the chest was performed using the standard  protocol during bolus administration of intravenous contrast. Multiplanar CT image reconstructions and MIPs were obtained to evaluate the vascular anatomy.  CONTRAST:  151mL OMNIPAQUE IOHEXOL 350 MG/ML SOLN  COMPARISON:  CT angiogram chest July 16, 2011; chest radiograph August 17, 2014  FINDINGS: There is no demonstrable pulmonary embolus. There is no thoracic aortic aneurysm or dissection. There is atherosclerotic change in the aorta as well as in the proximal visualized great vessels bilaterally. No hemodynamically significant obstruction is noted in the visualized great vessels.  There is mild bibasilar atelectatic change. There is no lung edema or consolidation. There is mild upper and lower lobe bronchiectatic change bilaterally.  There is again noted a 2 cm nodular lesion in the right lobe of the thyroid, stable. There is no appreciable thoracic adenopathy. There is calcification in the left anterior descending coronary artery. There is mild left ventricular hypertrophy. Pericardium is not appreciably thickened.  In the visualized upper abdomen, there is atherosclerotic change and moderate for for thrombus in the aorta. Visualized upper abdomen otherwise appears normal.  There is degenerative change in the thoracic spine. There is degenerative change in each shoulder. No blastic or lytic bone lesions.  Review of the MIP images confirms the above findings.  IMPRESSION: No demonstrable pulmonary embolus. Mild bronchiectatic change. Mild bibasilar atelectasis. No edema or consolidation. No adenopathy.  Stable 2 cm nodular lesion in the right lobe of the thyroid.  Foci of coronary artery calcification. Mild left ventricular hypertrophy. Extensive osteoarthritic change in the shoulders.   Electronically Signed   By: Lowella Grip III M.D.   On: 08/17/2014 21:37   Dg Chest Port 1 View  08/17/2014   CLINICAL DATA:  Shortness of breath, wheezing.  Pain under ribs.  EXAM: PORTABLE CHEST - 1 VIEW  COMPARISON:   07/31/2014  FINDINGS: Heart size is normal. No pleural effusion or edema identified. No airspace consolidation. There are advanced degenerative changes involving both glenohumeral joints. Surgical clips noted within the left axilla.  IMPRESSION: 1. No active cardiopulmonary abnormality.   Electronically Signed   By: Kerby Moors M.D.   On: 08/17/2014 17:29    Lab Results: Basic Metabolic Panel:  Recent Labs  08/22/14 0105  NA 139  K 4.0  CL 107  CO2 27  GLUCOSE 145*  BUN 28*  CREATININE 1.13*  CALCIUM 9.1   Liver Function Tests: No results for input(s): AST, ALT, ALKPHOS, BILITOT, PROT, ALBUMIN in the last 72  hours.   CBC:  Recent Labs  08/22/14 0105  WBC 10.8*  HGB 12.2  HCT 38.0  MCV 89.2  PLT 311    No results found for this or any previous visit (from the past 240 hour(s)).   Hospital Course:   This is an 81 years old female with history of multiple medical illnesses was admitted due to cough, wheezing and shortness of breath. She was treated as cas of COPD and improved. She has no history  of tobacco smoking or COPD in the past. Patient will be discharged on oral steroid and inhalers. Will do PFT in out patient.  Discharge Exam: Blood pressure 146/65, pulse 83, temperature 98.9 F (37.2 C), temperature source Oral, resp. rate 18, height 5\' 3"  (1.6 m), weight 101.923 kg (224 lb 11.2 oz), SpO2 95 %.   Disposition:  Home        Follow-up Information    Follow up with [facility].   Contact information:   [address] [place] [address] [phone]       Signed: Rosita Fire   08/22/2014, 8:23 AM

## 2014-08-25 DIAGNOSIS — E119 Type 2 diabetes mellitus without complications: Secondary | ICD-10-CM | POA: Diagnosis not present

## 2014-08-25 DIAGNOSIS — F0390 Unspecified dementia without behavioral disturbance: Secondary | ICD-10-CM | POA: Diagnosis not present

## 2014-08-25 DIAGNOSIS — J45909 Unspecified asthma, uncomplicated: Secondary | ICD-10-CM | POA: Diagnosis not present

## 2014-08-25 DIAGNOSIS — Z853 Personal history of malignant neoplasm of breast: Secondary | ICD-10-CM | POA: Diagnosis not present

## 2014-08-25 DIAGNOSIS — I10 Essential (primary) hypertension: Secondary | ICD-10-CM | POA: Diagnosis not present

## 2014-08-25 DIAGNOSIS — J441 Chronic obstructive pulmonary disease with (acute) exacerbation: Secondary | ICD-10-CM | POA: Diagnosis not present

## 2014-08-25 DIAGNOSIS — G20 Parkinson's disease: Secondary | ICD-10-CM | POA: Diagnosis not present

## 2014-08-25 DIAGNOSIS — Z9012 Acquired absence of left breast and nipple: Secondary | ICD-10-CM | POA: Diagnosis not present

## 2014-08-25 DIAGNOSIS — E669 Obesity, unspecified: Secondary | ICD-10-CM | POA: Diagnosis not present

## 2014-08-28 DIAGNOSIS — G20 Parkinson's disease: Secondary | ICD-10-CM | POA: Diagnosis not present

## 2014-08-28 DIAGNOSIS — J441 Chronic obstructive pulmonary disease with (acute) exacerbation: Secondary | ICD-10-CM | POA: Diagnosis not present

## 2014-08-28 DIAGNOSIS — I10 Essential (primary) hypertension: Secondary | ICD-10-CM | POA: Diagnosis not present

## 2014-08-28 DIAGNOSIS — E119 Type 2 diabetes mellitus without complications: Secondary | ICD-10-CM | POA: Diagnosis not present

## 2014-08-28 DIAGNOSIS — F0390 Unspecified dementia without behavioral disturbance: Secondary | ICD-10-CM | POA: Diagnosis not present

## 2014-08-28 DIAGNOSIS — J45909 Unspecified asthma, uncomplicated: Secondary | ICD-10-CM | POA: Diagnosis not present

## 2014-08-29 DIAGNOSIS — I10 Essential (primary) hypertension: Secondary | ICD-10-CM | POA: Diagnosis not present

## 2014-08-29 DIAGNOSIS — E784 Other hyperlipidemia: Secondary | ICD-10-CM | POA: Diagnosis not present

## 2014-08-29 DIAGNOSIS — F329 Major depressive disorder, single episode, unspecified: Secondary | ICD-10-CM | POA: Diagnosis not present

## 2014-08-29 DIAGNOSIS — E1165 Type 2 diabetes mellitus with hyperglycemia: Secondary | ICD-10-CM | POA: Diagnosis not present

## 2014-08-29 DIAGNOSIS — F0390 Unspecified dementia without behavioral disturbance: Secondary | ICD-10-CM | POA: Diagnosis not present

## 2014-08-29 DIAGNOSIS — J449 Chronic obstructive pulmonary disease, unspecified: Secondary | ICD-10-CM | POA: Diagnosis not present

## 2014-08-29 DIAGNOSIS — E041 Nontoxic single thyroid nodule: Secondary | ICD-10-CM | POA: Diagnosis not present

## 2014-08-29 DIAGNOSIS — G459 Transient cerebral ischemic attack, unspecified: Secondary | ICD-10-CM | POA: Diagnosis not present

## 2014-08-30 DIAGNOSIS — E119 Type 2 diabetes mellitus without complications: Secondary | ICD-10-CM | POA: Diagnosis not present

## 2014-08-30 DIAGNOSIS — J449 Chronic obstructive pulmonary disease, unspecified: Secondary | ICD-10-CM | POA: Diagnosis not present

## 2014-08-30 DIAGNOSIS — M159 Polyosteoarthritis, unspecified: Secondary | ICD-10-CM | POA: Diagnosis not present

## 2014-08-30 DIAGNOSIS — F0390 Unspecified dementia without behavioral disturbance: Secondary | ICD-10-CM | POA: Diagnosis not present

## 2014-08-30 DIAGNOSIS — E1165 Type 2 diabetes mellitus with hyperglycemia: Secondary | ICD-10-CM | POA: Diagnosis not present

## 2014-08-30 DIAGNOSIS — J45909 Unspecified asthma, uncomplicated: Secondary | ICD-10-CM | POA: Diagnosis not present

## 2014-08-30 DIAGNOSIS — N3090 Cystitis, unspecified without hematuria: Secondary | ICD-10-CM | POA: Diagnosis not present

## 2014-08-30 DIAGNOSIS — J441 Chronic obstructive pulmonary disease with (acute) exacerbation: Secondary | ICD-10-CM | POA: Diagnosis not present

## 2014-08-30 DIAGNOSIS — K839 Disease of biliary tract, unspecified: Secondary | ICD-10-CM | POA: Diagnosis not present

## 2014-08-30 DIAGNOSIS — G20 Parkinson's disease: Secondary | ICD-10-CM | POA: Diagnosis not present

## 2014-08-30 DIAGNOSIS — I10 Essential (primary) hypertension: Secondary | ICD-10-CM | POA: Diagnosis not present

## 2014-09-01 DIAGNOSIS — F0390 Unspecified dementia without behavioral disturbance: Secondary | ICD-10-CM | POA: Diagnosis not present

## 2014-09-01 DIAGNOSIS — J441 Chronic obstructive pulmonary disease with (acute) exacerbation: Secondary | ICD-10-CM | POA: Diagnosis not present

## 2014-09-01 DIAGNOSIS — E119 Type 2 diabetes mellitus without complications: Secondary | ICD-10-CM | POA: Diagnosis not present

## 2014-09-01 DIAGNOSIS — G20 Parkinson's disease: Secondary | ICD-10-CM | POA: Diagnosis not present

## 2014-09-01 DIAGNOSIS — J45909 Unspecified asthma, uncomplicated: Secondary | ICD-10-CM | POA: Diagnosis not present

## 2014-09-01 DIAGNOSIS — I10 Essential (primary) hypertension: Secondary | ICD-10-CM | POA: Diagnosis not present

## 2014-09-04 ENCOUNTER — Other Ambulatory Visit (HOSPITAL_COMMUNITY): Payer: Self-pay | Admitting: Respiratory Therapy

## 2014-09-04 DIAGNOSIS — J471 Bronchiectasis with (acute) exacerbation: Secondary | ICD-10-CM

## 2014-09-05 DIAGNOSIS — F0390 Unspecified dementia without behavioral disturbance: Secondary | ICD-10-CM | POA: Diagnosis not present

## 2014-09-05 DIAGNOSIS — H43813 Vitreous degeneration, bilateral: Secondary | ICD-10-CM | POA: Diagnosis not present

## 2014-09-05 DIAGNOSIS — E119 Type 2 diabetes mellitus without complications: Secondary | ICD-10-CM | POA: Diagnosis not present

## 2014-09-05 DIAGNOSIS — J441 Chronic obstructive pulmonary disease with (acute) exacerbation: Secondary | ICD-10-CM | POA: Diagnosis not present

## 2014-09-05 DIAGNOSIS — J45909 Unspecified asthma, uncomplicated: Secondary | ICD-10-CM | POA: Diagnosis not present

## 2014-09-05 DIAGNOSIS — H4011X3 Primary open-angle glaucoma, severe stage: Secondary | ICD-10-CM | POA: Diagnosis not present

## 2014-09-05 DIAGNOSIS — H04123 Dry eye syndrome of bilateral lacrimal glands: Secondary | ICD-10-CM | POA: Diagnosis not present

## 2014-09-05 DIAGNOSIS — I10 Essential (primary) hypertension: Secondary | ICD-10-CM | POA: Diagnosis not present

## 2014-09-05 DIAGNOSIS — G20 Parkinson's disease: Secondary | ICD-10-CM | POA: Diagnosis not present

## 2014-09-06 DIAGNOSIS — G20 Parkinson's disease: Secondary | ICD-10-CM | POA: Diagnosis not present

## 2014-09-06 DIAGNOSIS — F0390 Unspecified dementia without behavioral disturbance: Secondary | ICD-10-CM | POA: Diagnosis not present

## 2014-09-06 DIAGNOSIS — I10 Essential (primary) hypertension: Secondary | ICD-10-CM | POA: Diagnosis not present

## 2014-09-06 DIAGNOSIS — E119 Type 2 diabetes mellitus without complications: Secondary | ICD-10-CM | POA: Diagnosis not present

## 2014-09-06 DIAGNOSIS — J441 Chronic obstructive pulmonary disease with (acute) exacerbation: Secondary | ICD-10-CM | POA: Diagnosis not present

## 2014-09-06 DIAGNOSIS — J45909 Unspecified asthma, uncomplicated: Secondary | ICD-10-CM | POA: Diagnosis not present

## 2014-09-07 DIAGNOSIS — F0390 Unspecified dementia without behavioral disturbance: Secondary | ICD-10-CM | POA: Diagnosis not present

## 2014-09-07 DIAGNOSIS — J45909 Unspecified asthma, uncomplicated: Secondary | ICD-10-CM | POA: Diagnosis not present

## 2014-09-07 DIAGNOSIS — E119 Type 2 diabetes mellitus without complications: Secondary | ICD-10-CM | POA: Diagnosis not present

## 2014-09-07 DIAGNOSIS — J441 Chronic obstructive pulmonary disease with (acute) exacerbation: Secondary | ICD-10-CM | POA: Diagnosis not present

## 2014-09-07 DIAGNOSIS — I10 Essential (primary) hypertension: Secondary | ICD-10-CM | POA: Diagnosis not present

## 2014-09-07 DIAGNOSIS — G20 Parkinson's disease: Secondary | ICD-10-CM | POA: Diagnosis not present

## 2014-09-08 DIAGNOSIS — E119 Type 2 diabetes mellitus without complications: Secondary | ICD-10-CM | POA: Diagnosis not present

## 2014-09-08 DIAGNOSIS — G20 Parkinson's disease: Secondary | ICD-10-CM | POA: Diagnosis not present

## 2014-09-08 DIAGNOSIS — J45909 Unspecified asthma, uncomplicated: Secondary | ICD-10-CM | POA: Diagnosis not present

## 2014-09-08 DIAGNOSIS — F0390 Unspecified dementia without behavioral disturbance: Secondary | ICD-10-CM | POA: Diagnosis not present

## 2014-09-08 DIAGNOSIS — J441 Chronic obstructive pulmonary disease with (acute) exacerbation: Secondary | ICD-10-CM | POA: Diagnosis not present

## 2014-09-08 DIAGNOSIS — I10 Essential (primary) hypertension: Secondary | ICD-10-CM | POA: Diagnosis not present

## 2014-09-12 DIAGNOSIS — J441 Chronic obstructive pulmonary disease with (acute) exacerbation: Secondary | ICD-10-CM | POA: Diagnosis not present

## 2014-09-12 DIAGNOSIS — I10 Essential (primary) hypertension: Secondary | ICD-10-CM | POA: Diagnosis not present

## 2014-09-12 DIAGNOSIS — J45909 Unspecified asthma, uncomplicated: Secondary | ICD-10-CM | POA: Diagnosis not present

## 2014-09-12 DIAGNOSIS — F0390 Unspecified dementia without behavioral disturbance: Secondary | ICD-10-CM | POA: Diagnosis not present

## 2014-09-12 DIAGNOSIS — G20 Parkinson's disease: Secondary | ICD-10-CM | POA: Diagnosis not present

## 2014-09-12 DIAGNOSIS — E119 Type 2 diabetes mellitus without complications: Secondary | ICD-10-CM | POA: Diagnosis not present

## 2014-09-13 ENCOUNTER — Ambulatory Visit (HOSPITAL_COMMUNITY)
Admission: RE | Admit: 2014-09-13 | Discharge: 2014-09-13 | Disposition: A | Discharge status: Home / Self Care | Payer: Medicare Other | Source: Ambulatory Visit | Attending: Internal Medicine | Admitting: Internal Medicine

## 2014-09-13 DIAGNOSIS — J471 Bronchiectasis with (acute) exacerbation: Secondary | ICD-10-CM | POA: Insufficient documentation

## 2014-09-14 DIAGNOSIS — J441 Chronic obstructive pulmonary disease with (acute) exacerbation: Secondary | ICD-10-CM | POA: Diagnosis not present

## 2014-09-14 DIAGNOSIS — J45909 Unspecified asthma, uncomplicated: Secondary | ICD-10-CM | POA: Diagnosis not present

## 2014-09-14 DIAGNOSIS — I10 Essential (primary) hypertension: Secondary | ICD-10-CM | POA: Diagnosis not present

## 2014-09-14 DIAGNOSIS — G20 Parkinson's disease: Secondary | ICD-10-CM | POA: Diagnosis not present

## 2014-09-14 DIAGNOSIS — E119 Type 2 diabetes mellitus without complications: Secondary | ICD-10-CM | POA: Diagnosis not present

## 2014-09-14 DIAGNOSIS — F0390 Unspecified dementia without behavioral disturbance: Secondary | ICD-10-CM | POA: Diagnosis not present

## 2014-09-19 ENCOUNTER — Encounter (HOSPITAL_COMMUNITY): Payer: Self-pay

## 2014-09-19 ENCOUNTER — Encounter (HOSPITAL_COMMUNITY)
Admission: RE | Admit: 2014-09-19 | Discharge: 2014-09-19 | Disposition: A | Discharge status: Home / Self Care | Payer: Medicare Other | Source: Ambulatory Visit | Attending: Internal Medicine | Admitting: Internal Medicine

## 2014-09-19 VITALS — BP 110/70 | HR 82 | Ht 61.0 in | Wt 210.0 lb

## 2014-09-19 DIAGNOSIS — J40 Bronchitis, not specified as acute or chronic: Secondary | ICD-10-CM

## 2014-09-19 HISTORY — DX: Cerebral infarction, unspecified: I63.9

## 2014-09-19 NOTE — Progress Notes (Signed)
Cardiac/Pulmonary Rehab Medication Review by a Pharmacist  Does the patient  feel that his/her medications are working for him/her?  yes  Has the patient been experiencing any side effects to the medications prescribed?  no  Does the patient measure his/her own blood pressure or blood glucose at home?  yes   Does the patient have any problems obtaining medications due to transportation or finances?   No.  She does tell me she pays more for her medication than the insurance she has.  Her daughter helps her pay for her medication.  Understanding of regimen: excellent Understanding of indications: excellent Potential of compliance: excellent  Questions asked to Determine Patient Understanding of Medication Regimen:  1. What is the name of the medication?  2. What is the medication used for?  3. When should it be taken?  4. How much should be taken?  5. How will you take it?  6. What side effects should you report?  Understanding Defined as: Excellent: All questions above are correct Good: Questions 1-4 are correct Fair: Questions 1-2 are correct  Poor: 1 or none of the above questions are correct   Pharmacist comments: Tiffany Hartman has a good understanding of her medications.  Her daughter helps her pay for her medication and ensures she is compliant.    Tiffany Hartman 09/19/2014 8:55 AM

## 2014-09-20 ENCOUNTER — Other Ambulatory Visit (HOSPITAL_COMMUNITY): Payer: Self-pay | Admitting: Respiratory Therapy

## 2014-09-20 DIAGNOSIS — J441 Chronic obstructive pulmonary disease with (acute) exacerbation: Secondary | ICD-10-CM | POA: Diagnosis not present

## 2014-09-20 DIAGNOSIS — I10 Essential (primary) hypertension: Secondary | ICD-10-CM | POA: Diagnosis not present

## 2014-09-20 DIAGNOSIS — F0390 Unspecified dementia without behavioral disturbance: Secondary | ICD-10-CM | POA: Diagnosis not present

## 2014-09-20 DIAGNOSIS — E119 Type 2 diabetes mellitus without complications: Secondary | ICD-10-CM | POA: Diagnosis not present

## 2014-09-20 DIAGNOSIS — G20 Parkinson's disease: Secondary | ICD-10-CM | POA: Diagnosis not present

## 2014-09-20 DIAGNOSIS — J45909 Unspecified asthma, uncomplicated: Secondary | ICD-10-CM | POA: Diagnosis not present

## 2014-09-20 LAB — PULMONARY FUNCTION TEST
DL/VA % PRED: 121 %
DL/VA: 5.61 ml/min/mmHg/L
DLCO UNC % PRED: 58 %
DLCO unc: 12.95 ml/min/mmHg
FEF 25-75 PRE: 2.13 L/s
FEF2575-%PRED-PRE: 210 %
FEV1-%PRED-PRE: 96 %
FEV1-PRE: 1.24 L
FEV1FVC-%PRED-PRE: 128 %
FEV6-%Pred-Pre: 81 %
FEV6-Pre: 1.27 L
FEV6FVC-%Pred-Pre: 105 %
FVC-%Pred-Pre: 79 %
FVC-Pre: 1.3 L
PRE FEV1/FVC RATIO: 95 %
Pre FEV6/FVC Ratio: 100 %

## 2014-09-21 DIAGNOSIS — J449 Chronic obstructive pulmonary disease, unspecified: Secondary | ICD-10-CM | POA: Diagnosis not present

## 2014-09-25 NOTE — Progress Notes (Signed)
Patient was not a candidate for Pulmonary Rehab.  Patient and daughter advised patient may need PT prior to Pulmonary Rehab and daughter agreed.

## 2014-09-26 DIAGNOSIS — G20 Parkinson's disease: Secondary | ICD-10-CM | POA: Diagnosis not present

## 2014-09-26 DIAGNOSIS — J441 Chronic obstructive pulmonary disease with (acute) exacerbation: Secondary | ICD-10-CM | POA: Diagnosis not present

## 2014-09-26 DIAGNOSIS — J45909 Unspecified asthma, uncomplicated: Secondary | ICD-10-CM | POA: Diagnosis not present

## 2014-09-26 DIAGNOSIS — F0390 Unspecified dementia without behavioral disturbance: Secondary | ICD-10-CM | POA: Diagnosis not present

## 2014-09-26 DIAGNOSIS — I10 Essential (primary) hypertension: Secondary | ICD-10-CM | POA: Diagnosis not present

## 2014-09-26 DIAGNOSIS — E119 Type 2 diabetes mellitus without complications: Secondary | ICD-10-CM | POA: Diagnosis not present

## 2014-10-06 DIAGNOSIS — J441 Chronic obstructive pulmonary disease with (acute) exacerbation: Secondary | ICD-10-CM | POA: Diagnosis not present

## 2014-10-06 DIAGNOSIS — G20 Parkinson's disease: Secondary | ICD-10-CM | POA: Diagnosis not present

## 2014-10-06 DIAGNOSIS — F0390 Unspecified dementia without behavioral disturbance: Secondary | ICD-10-CM | POA: Diagnosis not present

## 2014-10-06 DIAGNOSIS — J45909 Unspecified asthma, uncomplicated: Secondary | ICD-10-CM | POA: Diagnosis not present

## 2014-10-06 DIAGNOSIS — E119 Type 2 diabetes mellitus without complications: Secondary | ICD-10-CM | POA: Diagnosis not present

## 2014-10-06 DIAGNOSIS — I10 Essential (primary) hypertension: Secondary | ICD-10-CM | POA: Diagnosis not present

## 2014-10-11 DIAGNOSIS — G20 Parkinson's disease: Secondary | ICD-10-CM | POA: Diagnosis not present

## 2014-10-11 DIAGNOSIS — J441 Chronic obstructive pulmonary disease with (acute) exacerbation: Secondary | ICD-10-CM | POA: Diagnosis not present

## 2014-10-11 DIAGNOSIS — J45909 Unspecified asthma, uncomplicated: Secondary | ICD-10-CM | POA: Diagnosis not present

## 2014-10-11 DIAGNOSIS — E119 Type 2 diabetes mellitus without complications: Secondary | ICD-10-CM | POA: Diagnosis not present

## 2014-10-11 DIAGNOSIS — F0390 Unspecified dementia without behavioral disturbance: Secondary | ICD-10-CM | POA: Diagnosis not present

## 2014-10-11 DIAGNOSIS — I10 Essential (primary) hypertension: Secondary | ICD-10-CM | POA: Diagnosis not present

## 2014-10-19 DIAGNOSIS — J45909 Unspecified asthma, uncomplicated: Secondary | ICD-10-CM | POA: Diagnosis not present

## 2014-10-19 DIAGNOSIS — J441 Chronic obstructive pulmonary disease with (acute) exacerbation: Secondary | ICD-10-CM | POA: Diagnosis not present

## 2014-10-19 DIAGNOSIS — E119 Type 2 diabetes mellitus without complications: Secondary | ICD-10-CM | POA: Diagnosis not present

## 2014-10-19 DIAGNOSIS — G20 Parkinson's disease: Secondary | ICD-10-CM | POA: Diagnosis not present

## 2014-10-19 DIAGNOSIS — F0390 Unspecified dementia without behavioral disturbance: Secondary | ICD-10-CM | POA: Diagnosis not present

## 2014-10-19 DIAGNOSIS — I10 Essential (primary) hypertension: Secondary | ICD-10-CM | POA: Diagnosis not present

## 2014-11-08 DIAGNOSIS — Z23 Encounter for immunization: Secondary | ICD-10-CM | POA: Diagnosis not present

## 2014-11-28 DIAGNOSIS — I10 Essential (primary) hypertension: Secondary | ICD-10-CM | POA: Diagnosis not present

## 2014-11-28 DIAGNOSIS — J449 Chronic obstructive pulmonary disease, unspecified: Secondary | ICD-10-CM | POA: Diagnosis not present

## 2014-11-28 DIAGNOSIS — E1165 Type 2 diabetes mellitus with hyperglycemia: Secondary | ICD-10-CM | POA: Diagnosis not present

## 2014-12-20 ENCOUNTER — Ambulatory Visit: Payer: Medicare Other | Admitting: Orthopedic Surgery

## 2014-12-21 ENCOUNTER — Ambulatory Visit: Payer: Medicare Other | Admitting: Orthopedic Surgery

## 2015-01-04 ENCOUNTER — Ambulatory Visit (INDEPENDENT_AMBULATORY_CARE_PROVIDER_SITE_OTHER): Payer: Medicare Other

## 2015-01-04 ENCOUNTER — Encounter: Payer: Self-pay | Admitting: Orthopedic Surgery

## 2015-01-04 ENCOUNTER — Ambulatory Visit (INDEPENDENT_AMBULATORY_CARE_PROVIDER_SITE_OTHER): Payer: Medicare Other | Admitting: Orthopedic Surgery

## 2015-01-04 VITALS — BP 124/68 | Ht 61.0 in | Wt 220.0 lb

## 2015-01-04 DIAGNOSIS — M542 Cervicalgia: Secondary | ICD-10-CM

## 2015-01-04 DIAGNOSIS — M129 Arthropathy, unspecified: Secondary | ICD-10-CM

## 2015-01-04 DIAGNOSIS — M25511 Pain in right shoulder: Secondary | ICD-10-CM

## 2015-01-04 DIAGNOSIS — M75101 Unspecified rotator cuff tear or rupture of right shoulder, not specified as traumatic: Secondary | ICD-10-CM

## 2015-01-04 DIAGNOSIS — M5020 Other cervical disc displacement, unspecified cervical region: Secondary | ICD-10-CM

## 2015-01-04 DIAGNOSIS — M19019 Primary osteoarthritis, unspecified shoulder: Secondary | ICD-10-CM

## 2015-01-04 DIAGNOSIS — M47812 Spondylosis without myelopathy or radiculopathy, cervical region: Secondary | ICD-10-CM

## 2015-01-04 MED ORDER — HYDROCODONE-ACETAMINOPHEN 5-325 MG PO TABS: 1.0000 | ORAL_TABLET | Freq: Four times a day (QID) | ORAL | Status: DC | PRN

## 2015-01-04 NOTE — Progress Notes (Signed)
Tiffany Hartman is a 81 y.o. female   Chief Complaint  Patient presents with  . Shoulder Pain    Right shoulder pain x 2 months, no known injury, referred by Dr Legrand Rams  . Neck Pain     this patient has pain in the right shoulder 6 October of this year no trauma. Symptoms include pain, stiffness with throbbing stabbing sensation and worse after activity. She also complains of neck pain decreased range of motion and numbness in her left arm. She has not had physical therapy injection or oral medication she did take some Tylenol for pain did not help    review of systems we find that she has fatigue shortness of breath breathing issues ankle leg edema swollen and stiff joints seasonal allergies lightheadedness and dizziness    Shoulder Pain:  Review of Systems - as above    Past Medical History  Diagnosis Date  . Dementia   . Bronchitis   . Asthma   . Diabetes mellitus   . Parkinson disease   . HTN (hypertension)   . OA (osteoarthritis)   . Thyroid nodule     Dr Legrand Rams  . Glaucoma   . Diverticulitis 2009  . Breast cancer 02/20/83    LT mastectomy  . Obesity 08/17/2014  . Stroke    Past Surgical History  Procedure Laterality Date  . Abdominal hysterectomy      complete  . Joint replacement  1998/1999    knee boths  . Mastectomy  02/20/1983    left   . Fracture surgery  2008     right and left femurs  . Appendectomy    .  bilateral catracts    . Eus  09/10/2011    Procedure: UPPER ENDOSCOPIC ULTRASOUND (EUS) RADIAL;  Surgeon: Arta Silence, MD;  Location: [facility];  Service: Endoscopy;  Laterality: N/A;  christina/ebp  . Fine needle aspiration  09/10/2011    Procedure: FINE NEEDLE ASPIRATION (FNA) LINEAR;  Surgeon: Arta Silence, MD;  Location: [facility];  Service: Endoscopy;  Laterality: N/A;  . Knee surgery Right     TKA  . Knee surgery Left     TKA  . Knee surgery Right     Periprosthetic fracture Linna Caprice    Current outpatient prescriptions:  .  ALPRAZolam  (XANAX) 0.25 MG tablet, Take 0.25 mg by mouth 3 (three) times daily. , Disp: , Rfl:  .  amLODipine (NORVASC) 5 MG tablet, Take 5 mg by mouth daily., Disp: , Rfl:  .  Calcium Carbonate-Vitamin D (CALCIUM + D) 600-200 MG-UNIT TABS, Take 1 tablet by mouth every evening. , Disp: , Rfl:  .  clopidogrel (PLAVIX) 75 MG tablet, Take 1 tablet (75 mg total) by mouth daily with breakfast., Disp: 30 tablet, Rfl: 3 .  donepezil (ARICEPT) 10 MG tablet, Take 10 mg by mouth at bedtime., Disp: , Rfl:  .  ferrous gluconate (FERGON) 325 MG tablet, Take 325 mg by mouth daily with breakfast., Disp: , Rfl:  .  FLUoxetine (PROZAC) 20 MG capsule, Take 20 mg by mouth daily., Disp: , Rfl:  .  folic acid (FOLVITE) 1 MG tablet, Take 1 mg by mouth daily., Disp: , Rfl:  .  furosemide (LASIX) 40 MG tablet, Take 40 mg by mouth., Disp: , Rfl:  .  gabapentin (NEURONTIN) 300 MG capsule, Take 1 capsule (300 mg total) by mouth 3 (three) times daily., Disp: 30 capsule, Rfl: 1 .  ipratropium-albuterol (DUONEB) 0.5-2.5 (3) MG/3ML SOLN, Take 3  mLs by nebulization every 4 (four) hours. (Patient taking differently: Take 3 mLs by nebulization 2 (two) times daily. ), Disp: 360 mL, Rfl: 3 .  latanoprost (XALATAN) 0.005 % ophthalmic solution, Place 1 drop into both eyes at bedtime., Disp: , Rfl:  .  levETIRAcetam (KEPPRA) 250 MG tablet, Take 250 mg by mouth 2 (two) times daily., Disp: , Rfl:  .  losartan (COZAAR) 100 MG tablet, Take 100 mg by mouth daily., Disp: , Rfl:  .  lovastatin (MEVACOR) 10 MG tablet, Take 10 mg by mouth at bedtime., Disp: , Rfl:  .  omeprazole (PRILOSEC) 20 MG capsule, Take 20 mg by mouth daily., Disp: , Rfl:  .  potassium chloride SA (K-DUR,KLOR-CON) 20 MEQ tablet, Take 20 mEq by mouth 2 (two) times daily., Disp: , Rfl:  .  HYDROcodone-acetaminophen (NORCO/VICODIN) 5-325 MG per tablet, Take 1 tablet by mouth every 4 (four) hours as needed for moderate pain. Reported on 01/04/2015, Disp: , Rfl:  .  predniSONE (DELTASONE)  10 MG tablet, 40 mg po daily for 3  Days, 30 mg po daily for 3 days, 20 mg po daily for 3 days, 10 mg po daily for 3 days. (Patient not taking: Reported on 09/19/2014), Disp: 30 tablet, Rfl: 0 No Known Allergies  reports that she has never smoked. She has never used smokeless tobacco. She reports that she does not drink alcohol or use illicit drugs. Family History  Problem Relation Age of Onset  . Colon polyps Daughter 9  . Stroke Father   . Huntington's disease Mother   . Throat cancer Son    BP 124/68 mmHg  Ht 5\' 1"  (1.549 m)  Wt 220 lb (99.791 kg)  BMI 41.59 kg/m2  Physical Exam  Constitutional: She is oriented to person, place, and time. She appears well-developed and well-nourished. She appears distressed.  HENT:  Head: Normocephalic.  Neck: No tracheal deviation present. No thyromegaly present.  Cardiovascular: Intact distal pulses.   Pulmonary/Chest: She has wheezes.  Lymphadenopathy:    She has no cervical adenopathy.  Neurological: She is alert and oriented to person, place, and time. She exhibits normal muscle tone.  Skin: Skin is warm and dry. No rash noted. She is not diaphoretic. No erythema. No pallor.  Psychiatric: Her behavior is normal. Judgment and thought content normal.   Ortho Exam Shoulder:  right shoulder the patient has no active abduction or forward elevation. Fortunately she has 50 of passive external rotation. The rotator cuff is obviously weak 2 out of 5 shoulder stable neurovascular exam remains intact she has tenderness in the shoulder at the joint line anteriorly.   she is ambulatory with a cane with a shuffling gait   cervical spine exam shows decreased range of motion in all planes painful extension and flexion painful rotation and extension and positive Spurling sign with a left-sided radicular pain. She has decreased sensation in the C5-C6 distribution of the left arm primarily of the deltoid and forearm. Her grip strength is normal Hoffman's is  negative.  Left upper extremity global weakness   reflexes at the elbow remains 2+    2 views right shoulder  Patient complains of right shoulder pain and stiffness  The x-ray shows the following  The AP view shows glenohumeral arthritis grade 4 with cyst formation sclerosis and osteophytes. We see osteophyte formation at the inferior margin of the humerus. We also see greater tuberosity sclerosis and undersurface sclerosis of the acromion suggesting chronic stress  A lateral x-ray  we continue to see before meals joint arthrosis spurring of the humerus  Lung fields look normal soft tissue otherwise normal no bone lesion  Impression grade 4 osteoarthritis of the glenohumeral joint with osteoarthritis of the before meals joint as well.    C-spine films are also obtained. She has some degenerative disc disease and spondylosis but the contour and shape of her head and neck and the private pain in her shoulder does not allow full C-spine evaluation and we will have to continue with further imaging with MRI with working diagnosis of cervical spondylosis and spinal stenosis possible herniated disc     assessment. The patient has a very poor respiratory status as she talks and gets further into her conversation her respiratory situation worsens and she become short of breath just recently discharged from the hospital with bronchitis   Her right shoulder x-ray shows that she needs a total shoulder or reverse of the cuff is nonfunctional   cervical spine shows spondylosis recommend MRI to image to determine the cause of her left upper extremity numbness and weakness.  Diagnosis #1 osteoarthritis right shoulder  Diagnosis #2 torn right rotator cuff Diagnoses #3 cervical spondylosis with herniated disc  Meds ordered this encounter  Medications  . furosemide (LASIX) 40 MG tablet    Sig: Take 40 mg by mouth.  . potassium chloride SA (K-DUR,KLOR-CON) 20 MEQ tablet    Sig: Take 20 mEq by mouth 2  (two) times daily.  Marland Kitchen HYDROcodone-acetaminophen (NORCO/VICODIN) 5-325 MG tablet    Sig: Take 1 tablet by mouth every 6 (six) hours as needed for moderate pain.    Dispense:  120 tablet    Refill:  0    Encounter Diagnoses  Name Primary?  . Neck pain Yes  . Right shoulder pain   . Acromioclavicular joint arthritis   . Arthritis, shoulder region   . Herniated disc, cervical   . Rotator cuff tear, right   . Cervical spondylosis

## 2015-01-04 NOTE — Patient Instructions (Signed)
We will schedule MRI for you and call you with results 

## 2015-01-09 ENCOUNTER — Telehealth: Payer: Self-pay | Admitting: *Deleted

## 2015-01-09 NOTE — Telephone Encounter (Signed)
Patient's daughter Deneise Lever called and per Deneise Lever someone from this office told the patient that before a test can be scheduled they will need her new insurance card. patient will not get a new insurance card due to her having the traditional medicare. Please advise Deneise Lever with any questions [phone].

## 2015-01-09 NOTE — Telephone Encounter (Signed)
Yes, insurance cards for 2017 are needed for pre-authorization of MRI that was ordered 01/04/15.  I returned patient's call and left another voice message to bring in cards.

## 2015-01-10 NOTE — Telephone Encounter (Signed)
Verified patient's Medicare [employer] (this is her only insurer) through [employer] online portal; effective and active.

## 2015-01-10 NOTE — Telephone Encounter (Signed)
Arbie Cookey spoke with annie patient's daughter. 01/10/15.

## 2015-01-24 ENCOUNTER — Ambulatory Visit (HOSPITAL_COMMUNITY)
Admission: RE | Admit: 2015-01-24 | Discharge: 2015-01-24 | Disposition: A | Discharge status: Home / Self Care | Payer: Medicare Other | Source: Ambulatory Visit | Attending: Orthopedic Surgery | Admitting: Orthopedic Surgery

## 2015-01-24 DIAGNOSIS — M479 Spondylosis, unspecified: Secondary | ICD-10-CM | POA: Diagnosis not present

## 2015-01-24 DIAGNOSIS — M5020 Other cervical disc displacement, unspecified cervical region: Secondary | ICD-10-CM

## 2015-01-24 DIAGNOSIS — M50322 Other cervical disc degeneration at C5-C6 level: Secondary | ICD-10-CM | POA: Diagnosis not present

## 2015-01-24 DIAGNOSIS — M50321 Other cervical disc degeneration at C4-C5 level: Secondary | ICD-10-CM | POA: Insufficient documentation

## 2015-01-29 ENCOUNTER — Telehealth: Payer: Self-pay | Admitting: Orthopedic Surgery

## 2015-01-29 NOTE — Telephone Encounter (Signed)
  IMPRESSION: 1. Cervical spondylosis and degenerative disc disease, causing moderate impingement at C3-4, C4-5, and C5-6; and mild impingement at T1- 2, as detailed above. 2. Stably advanced chronic ischemic microvascular white matter disease involving the pons.

## 2015-01-30 ENCOUNTER — Emergency Department (HOSPITAL_COMMUNITY): Payer: Medicare Other

## 2015-01-30 ENCOUNTER — Encounter (HOSPITAL_COMMUNITY): Payer: Self-pay | Admitting: Emergency Medicine

## 2015-01-30 ENCOUNTER — Inpatient Hospital Stay (HOSPITAL_COMMUNITY)
Admission: EM | Admit: 2015-01-30 | Discharge: 2015-02-03 | DRG: 203 | Disposition: A | Discharge status: Home / Self Care | Payer: Medicare Other | Attending: Internal Medicine | Admitting: Internal Medicine

## 2015-01-30 DIAGNOSIS — G40909 Epilepsy, unspecified, not intractable, without status epilepticus: Secondary | ICD-10-CM | POA: Diagnosis not present

## 2015-01-30 DIAGNOSIS — J45901 Unspecified asthma with (acute) exacerbation: Secondary | ICD-10-CM

## 2015-01-30 DIAGNOSIS — J45902 Unspecified asthma with status asthmaticus: Secondary | ICD-10-CM | POA: Diagnosis not present

## 2015-01-30 DIAGNOSIS — Z853 Personal history of malignant neoplasm of breast: Secondary | ICD-10-CM

## 2015-01-30 DIAGNOSIS — Z808 Family history of malignant neoplasm of other organs or systems: Secondary | ICD-10-CM

## 2015-01-30 DIAGNOSIS — I10 Essential (primary) hypertension: Secondary | ICD-10-CM | POA: Diagnosis present

## 2015-01-30 DIAGNOSIS — H409 Unspecified glaucoma: Secondary | ICD-10-CM | POA: Diagnosis present

## 2015-01-30 DIAGNOSIS — Z8371 Family history of colonic polyps: Secondary | ICD-10-CM

## 2015-01-30 DIAGNOSIS — Z9012 Acquired absence of left breast and nipple: Secondary | ICD-10-CM

## 2015-01-30 DIAGNOSIS — Z8673 Personal history of transient ischemic attack (TIA), and cerebral infarction without residual deficits: Secondary | ICD-10-CM

## 2015-01-30 DIAGNOSIS — E1149 Type 2 diabetes mellitus with other diabetic neurological complication: Secondary | ICD-10-CM

## 2015-01-30 DIAGNOSIS — Z823 Family history of stroke: Secondary | ICD-10-CM

## 2015-01-30 DIAGNOSIS — R0600 Dyspnea, unspecified: Secondary | ICD-10-CM | POA: Diagnosis present

## 2015-01-30 DIAGNOSIS — E119 Type 2 diabetes mellitus without complications: Secondary | ICD-10-CM | POA: Diagnosis present

## 2015-01-30 DIAGNOSIS — R0602 Shortness of breath: Secondary | ICD-10-CM | POA: Diagnosis not present

## 2015-01-30 DIAGNOSIS — Z7902 Long term (current) use of antithrombotics/antiplatelets: Secondary | ICD-10-CM

## 2015-01-30 DIAGNOSIS — G20 Parkinson's disease: Secondary | ICD-10-CM | POA: Diagnosis not present

## 2015-01-30 DIAGNOSIS — F0390 Unspecified dementia without behavioral disturbance: Secondary | ICD-10-CM | POA: Diagnosis present

## 2015-01-30 LAB — COMPREHENSIVE METABOLIC PANEL WITH GFR
ALT: 10 U/L — ABNORMAL LOW (ref 14–54)
AST: 14 U/L — ABNORMAL LOW (ref 15–41)
Albumin: 3.5 g/dL (ref 3.5–5.0)
Alkaline Phosphatase: 58 U/L (ref 38–126)
Anion gap: 8 (ref 5–15)
BUN: 15 mg/dL (ref 6–20)
CO2: 24 mmol/L (ref 22–32)
Calcium: 8.7 mg/dL — ABNORMAL LOW (ref 8.9–10.3)
Chloride: 110 mmol/L (ref 101–111)
Creatinine, Ser: 1.33 mg/dL — ABNORMAL HIGH (ref 0.44–1.00)
GFR calc Af Amer: 41 mL/min — ABNORMAL LOW
GFR calc non Af Amer: 35 mL/min — ABNORMAL LOW
Glucose, Bld: 118 mg/dL — ABNORMAL HIGH (ref 65–99)
Potassium: 3.8 mmol/L (ref 3.5–5.1)
Sodium: 142 mmol/L (ref 135–145)
Total Bilirubin: 0.1 mg/dL — ABNORMAL LOW (ref 0.3–1.2)
Total Protein: 6.4 g/dL — ABNORMAL LOW (ref 6.5–8.1)

## 2015-01-30 LAB — CBC WITH DIFFERENTIAL/PLATELET
Basophils Absolute: 0.1 10*3/uL (ref 0.0–0.1)
Basophils Relative: 1 %
EOS ABS: 0.3 10*3/uL (ref 0.0–0.7)
Eosinophils Relative: 6 %
HCT: 34.6 % — ABNORMAL LOW (ref 36.0–46.0)
HEMOGLOBIN: 11 g/dL — AB (ref 12.0–15.0)
LYMPHS ABS: 2.1 10*3/uL (ref 0.7–4.0)
Lymphocytes Relative: 35 %
MCH: 28.6 pg (ref 26.0–34.0)
MCHC: 31.8 g/dL (ref 30.0–36.0)
MCV: 90.1 fL (ref 78.0–100.0)
MONO ABS: 0.3 10*3/uL (ref 0.1–1.0)
MONOS PCT: 5 %
NEUTROS PCT: 53 %
Neutro Abs: 3.1 10*3/uL (ref 1.7–7.7)
Platelets: 320 10*3/uL (ref 150–400)
RBC: 3.84 MIL/uL — AB (ref 3.87–5.11)
RDW: 14.3 % (ref 11.5–15.5)
WBC: 5.9 10*3/uL (ref 4.0–10.5)

## 2015-01-30 LAB — TROPONIN I: Troponin I: <0.03 ng/mL (ref <0.031)

## 2015-01-30 LAB — BRAIN NATRIURETIC PEPTIDE: B Natriuretic Peptide: 48 pg/mL (ref 0.0–100.0)

## 2015-01-30 MED ORDER — IPRATROPIUM-ALBUTEROL 0.5-2.5 (3) MG/3ML IN SOLN: 3.0000 mL | Freq: Once | RESPIRATORY_TRACT | Status: DC

## 2015-01-30 MED ORDER — IPRATROPIUM-ALBUTEROL 0.5-2.5 (3) MG/3ML IN SOLN
3.0000 mL | RESPIRATORY_TRACT | Status: AC
  Administered 2015-01-30 (×3): 3 mL via RESPIRATORY_TRACT
  Filled 2015-01-30: qty 9

## 2015-01-30 MED ORDER — ACETAMINOPHEN 500 MG PO TABS
1000.0000 mg | ORAL_TABLET | Freq: Once | ORAL | Status: AC
  Administered 2015-01-30: 1000 mg via ORAL
  Filled 2015-01-30: qty 2

## 2015-01-30 MED ORDER — PREDNISONE 50 MG PO TABS
60.0000 mg | ORAL_TABLET | Freq: Once | ORAL | Status: AC
  Administered 2015-01-30: 60 mg via ORAL
  Filled 2015-01-30: qty 1

## 2015-01-30 NOTE — ED Notes (Signed)
MD at the bedside  

## 2015-01-30 NOTE — ED Provider Notes (Addendum)
CSN: [id number]     Arrival date & time 01/30/15  2102 History  By signing my name below, I, [facility], attest that this documentation has been prepared under the direction and in the presence of Forde Dandy, MD. Electronically Signed: Virgel Bouquet, ED Scribe. 01/30/2015. 1:17 AM.   Chief Complaint  Patient presents with  . Shortness of Breath   The history is provided by the patient and medical records. No language interpreter was used.   HPI Comments: Tiffany Hartman is a 83 y.o. female with an hx of asthma, bronchitis, dementia, Parkinson's disease, DM, and HTN, who presents to the Emergency Department complaining of constant, gradually worsening, moderate SOB for the past 3 days, consistent with prior history of asthma exacerbation.  She endorses associated a dry, hacking cough, with congestion and chest tightness. Also w/ component of right sided pain underneath her breast. CP is worse with palpation, movement, and breathing. She has used a nebulizer TID at home and a humidifier without relief. Per patient, she was admitted to [facility] for similar symptoms last year and was discharged home with prednisone. She denies home O2 use. Patient denies recent sick contacts. She denies fever.  Past Medical History  Diagnosis Date  . Dementia   . Bronchitis   . Asthma   . Diabetes mellitus ([place])   . Parkinson disease ([place])   . HTN (hypertension)   . OA (osteoarthritis)   . Thyroid nodule     Dr Legrand Rams  . Glaucoma   . Diverticulitis 2009  . Breast cancer ([place]) 02/20/83    LT mastectomy  . Obesity 08/17/2014  . Stroke [facility])    Past Surgical History  Procedure Laterality Date  . Abdominal hysterectomy      complete  . Joint replacement  1998/1999    knee boths  . Mastectomy  02/20/1983    left   . Fracture surgery  2008     right and left femurs  . Appendectomy    .  bilateral catracts    . Eus  09/10/2011    Procedure: UPPER ENDOSCOPIC ULTRASOUND (EUS) RADIAL;  Surgeon:  Arta Silence, MD;  Location: [facility] ENDOSCOPY;  Service: Endoscopy;  Laterality: N/A;  christina/ebp  . Fine needle aspiration  09/10/2011    Procedure: FINE NEEDLE ASPIRATION (FNA) LINEAR;  Surgeon: Arta Silence, MD;  Location: [facility] ENDOSCOPY;  Service: Endoscopy;  Laterality: N/A;  . Knee surgery Right     TKA  . Knee surgery Left     TKA  . Knee surgery Right     Periprosthetic fracture Linna Caprice   Family History  Problem Relation Age of Onset  . Colon polyps Daughter 46  . Stroke Father   . Huntington's disease Mother   . Throat cancer Son    Social History  Substance Use Topics  . Smoking status: Never Smoker   . Smokeless tobacco: Never Used  . Alcohol Use: No   OB History    No data available     Review of Systems  Constitutional: Negative for fever.  Respiratory: Positive for cough and shortness of breath.   Cardiovascular: Positive for chest pain and leg swelling.  All other systems reviewed and are negative.     Allergies  Review of patient's allergies indicates no known allergies.  Home Medications   Prior to Admission medications   Medication Sig Start Date End Date Taking? Authorizing Provider  albuterol (PROVENTIL HFA;VENTOLIN HFA) 108 (90 Base) MCG/ACT inhaler Inhale 1-2  puffs into the lungs every 6 (six) hours as needed for wheezing or shortness of breath.   Yes Historical Provider, MD  ALPRAZolam (XANAX) 0.25 MG tablet Take 0.25 mg by mouth 3 (three) times daily.    Yes Historical Provider, MD  amLODipine (NORVASC) 5 MG tablet Take 5 mg by mouth daily.   Yes Historical Provider, MD  Calcium Carbonate-Vitamin D (CALCIUM + D) 600-200 MG-UNIT TABS Take 1 tablet by mouth every evening.    Yes Historical Provider, MD  clopidogrel (PLAVIX) 75 MG tablet Take 1 tablet (75 mg total) by mouth daily with breakfast. 08/16/12  Yes Rosita Fire, MD  donepezil (ARICEPT) 10 MG tablet Take 10 mg by mouth at bedtime.   Yes Historical Provider, MD  ferrous gluconate (FERGON)  325 MG tablet Take 325 mg by mouth daily with breakfast.   Yes Historical Provider, MD  FLUoxetine (PROZAC) 20 MG capsule Take 20 mg by mouth daily.   Yes Historical Provider, MD  folic acid (FOLVITE) 1 MG tablet Take 1 mg by mouth daily.   Yes Historical Provider, MD  furosemide (LASIX) 40 MG tablet Take 40 mg by mouth daily.    Yes Historical Provider, MD  gabapentin (NEURONTIN) 300 MG capsule Take 1 capsule (300 mg total) by mouth 3 (three) times daily. 06/02/12  Yes Carole Civil, MD  HYDROcodone-acetaminophen (NORCO/VICODIN) 5-325 MG tablet Take 1 tablet by mouth every 6 (six) hours as needed for moderate pain. 01/04/15  Yes Carole Civil, MD  ipratropium-albuterol (DUONEB) 0.5-2.5 (3) MG/3ML SOLN Take 3 mLs by nebulization every 4 (four) hours. Patient taking differently: Take 3 mLs by nebulization 4 (four) times daily.  08/22/14  Yes Rosita Fire, MD  latanoprost (XALATAN) 0.005 % ophthalmic solution Place 1 drop into both eyes at bedtime.   Yes Historical Provider, MD  levETIRAcetam (KEPPRA) 250 MG tablet Take 250 mg by mouth 2 (two) times daily.   Yes Historical Provider, MD  losartan (COZAAR) 100 MG tablet Take 100 mg by mouth daily.   Yes Historical Provider, MD  lovastatin (MEVACOR) 10 MG tablet Take 10 mg by mouth at bedtime.   Yes Historical Provider, MD  omeprazole (PRILOSEC) 20 MG capsule Take 20 mg by mouth 2 (two) times daily.    Yes Historical Provider, MD  potassium chloride SA (K-DUR,KLOR-CON) 20 MEQ tablet Take 20 mEq by mouth 2 (two) times daily.   Yes Historical Provider, MD  predniSONE (DELTASONE) 10 MG tablet Take 4 tablets (40 mg total) by mouth daily. 01/31/15   Forde Dandy, MD   BP 125/56 mmHg  Pulse 71  Temp(Src) 97.4 F (36.3 C) (Oral)  Resp 20  Ht 5' (1.524 m)  Wt 205 lb (92.987 kg)  BMI 40.04 kg/m2  SpO2 100% Physical Exam Physical Exam  Nursing note and vitals reviewed. Constitutional: Well developed, well nourished, non-toxic, and in no acute  distress Head: Normocephalic and atraumatic.  Mouth/Throat: Oropharynx is clear and moist.  Neck: Normal range of motion. Neck supple.  Cardiovascular: Normal rate and regular rhythm.  Bilateral symmetric pedal edema. Pulmonary/Chest: Tachypnea. Expiratory wheezing on all lung fields. Abdominal: Soft. There is no tenderness. There is no rebound and no guarding.  Musculoskeletal: Normal range of motion.  Neurological: Alert, no facial droop,moves all extremities symmetrically Skin: Skin is warm and dry.  Psychiatric: Cooperative  ED Course  Procedures   DIAGNOSTIC STUDIES: Oxygen Saturation is 100% on RA, normal by my interpretation.    COORDINATION OF CARE: 9:23 PM  Will order Duoneb, Tylenol, prednisone, labs, an EKG, and chest x-ray. Discussed treatment plan with pt at bedside and pt agreed to plan.  Labs Review Labs Reviewed  CBC WITH DIFFERENTIAL/PLATELET - Abnormal; Notable for the following:    RBC 3.84 (*)    Hemoglobin 11.0 (*)    HCT 34.6 (*)    All other components within normal limits  COMPREHENSIVE METABOLIC PANEL - Abnormal; Notable for the following:    Glucose, Bld 118 (*)    Creatinine, Ser 1.33 (*)    Calcium 8.7 (*)    Total Protein 6.4 (*)    AST 14 (*)    ALT 10 (*)    Total Bilirubin 0.1 (*)    GFR calc non Af Amer 35 (*)    GFR calc Af Amer 41 (*)    All other components within normal limits  BRAIN NATRIURETIC PEPTIDE  TROPONIN I  I-STAT TROPOININ, ED  I-STAT TROPOININ, ED    Imaging Review Dg Chest 2 View  01/30/2015  CLINICAL DATA:  Acute onset of shortness of breath and congestion. Initial encounter. EXAM: CHEST  2 VIEW COMPARISON:  Chest radiograph and CTA of the chest performed 08/17/2014 FINDINGS: The lungs are well-aerated. Mild vascular congestion is noted. There is no evidence of focal opacification, pleural effusion or pneumothorax. The heart is normal in size; the mediastinal contour is within normal limits. No acute osseous abnormalities  are seen. IMPRESSION: Mild vascular congestion noted.  Lungs remain grossly clear. Electronically Signed   By: Garald Balding M.D.   On: 01/30/2015 22:53   I have personally reviewed and evaluated these images and lab results as part of my medical decision-making.   EKG Interpretation   Date/Time:  Wednesday January 31 2015 00:52:40 EST Ventricular Rate:  74 PR Interval:  158 QRS Duration: 85 QT Interval:  427 QTC Calculation: 474 R Axis:   5 Text Interpretation:  Sinus rhythm Abnormal R-wave progression, early  transition Abnormal ekg no change from  08/22/14 Confirmed by Christy Gentles  MD,  [place] ([id number]) on 01/31/2015 12:58:54 AM     CRITICAL CARE Performed by: Forde Dandy   Total critical care time: 35 minutes  Critical care time was exclusive of separately billable procedures and treating other patients.  Critical care was necessary to treat or prevent imminent or life-threatening deterioration.  Critical care was time spent personally by me on the following activities: development of treatment plan with patient and/or surrogate as well as nursing, discussions with consultants, evaluation of patient's response to treatment, examination of patient, obtaining history from patient or surrogate, ordering and performing treatments and interventions, ordering and review of laboratory studies, ordering and review of radiographic studies, pulse oximetry and re-evaluation of patient's condition.   MDM   Final diagnoses:  Asthma exacerbation    82 year old female with history of asthma, Parkinson's disease, hypertension diabetes who presents with shortness of breath, progressive over the past 3 days, Which she feels is consistent with her asthma. On presentation has tachypnea, but on room air with normal oxygenation and speaks in full sentences. Expiratory wheezes noted in all lung fields. Chest wall pain with palpation and movement, seem more MSK in origin. Given duoneb x 3 and prednisone,  with improvement in work of breathing and reported subjective improvement. CXR without acute cardiopulmonary processes aside from mild pulm vascular congestion. Normal BNP and does not seem c/w heart failure at this time. Troponin x 2 negative. Serial EKGs without ischemic changes. Observed in ED,  and given duoneb when spaced to 3-4 hours. Unable to ambulate past doorway of room due to reported dyspnea, increased work of breathing and recurrent wheezing. Given additional breathing treatment. Will plan to admit for asthma exacerbation. Discussed with Dr. Shanon Brow.  I personally performed the services described in this documentation, which was scribed in my presence. The recorded information has been reviewed and is accurate.    Forde Dandy, MD 01/31/15 0120  Forde Dandy, MD 01/31/15 [phone]

## 2015-01-30 NOTE — ED Notes (Signed)
Pt c/o increased sob x 3 days. 

## 2015-01-31 ENCOUNTER — Encounter (HOSPITAL_COMMUNITY): Payer: Self-pay | Admitting: *Deleted

## 2015-01-31 DIAGNOSIS — Z8673 Personal history of transient ischemic attack (TIA), and cerebral infarction without residual deficits: Secondary | ICD-10-CM | POA: Diagnosis not present

## 2015-01-31 DIAGNOSIS — G40909 Epilepsy, unspecified, not intractable, without status epilepticus: Secondary | ICD-10-CM | POA: Diagnosis present

## 2015-01-31 DIAGNOSIS — J45901 Unspecified asthma with (acute) exacerbation: Secondary | ICD-10-CM | POA: Diagnosis not present

## 2015-01-31 DIAGNOSIS — R0600 Dyspnea, unspecified: Secondary | ICD-10-CM | POA: Diagnosis not present

## 2015-01-31 DIAGNOSIS — Z9012 Acquired absence of left breast and nipple: Secondary | ICD-10-CM | POA: Diagnosis not present

## 2015-01-31 DIAGNOSIS — G20 Parkinson's disease: Secondary | ICD-10-CM

## 2015-01-31 DIAGNOSIS — I10 Essential (primary) hypertension: Secondary | ICD-10-CM | POA: Diagnosis present

## 2015-01-31 DIAGNOSIS — J45902 Unspecified asthma with status asthmaticus: Secondary | ICD-10-CM | POA: Diagnosis present

## 2015-01-31 DIAGNOSIS — H409 Unspecified glaucoma: Secondary | ICD-10-CM | POA: Diagnosis present

## 2015-01-31 DIAGNOSIS — Z853 Personal history of malignant neoplasm of breast: Secondary | ICD-10-CM | POA: Diagnosis not present

## 2015-01-31 DIAGNOSIS — Z7902 Long term (current) use of antithrombotics/antiplatelets: Secondary | ICD-10-CM | POA: Diagnosis not present

## 2015-01-31 DIAGNOSIS — Z823 Family history of stroke: Secondary | ICD-10-CM | POA: Diagnosis not present

## 2015-01-31 DIAGNOSIS — J449 Chronic obstructive pulmonary disease, unspecified: Secondary | ICD-10-CM | POA: Diagnosis not present

## 2015-01-31 DIAGNOSIS — J45909 Unspecified asthma, uncomplicated: Secondary | ICD-10-CM | POA: Insufficient documentation

## 2015-01-31 DIAGNOSIS — Z8371 Family history of colonic polyps: Secondary | ICD-10-CM | POA: Diagnosis not present

## 2015-01-31 DIAGNOSIS — E119 Type 2 diabetes mellitus without complications: Secondary | ICD-10-CM | POA: Diagnosis present

## 2015-01-31 DIAGNOSIS — E1165 Type 2 diabetes mellitus with hyperglycemia: Secondary | ICD-10-CM | POA: Diagnosis not present

## 2015-01-31 DIAGNOSIS — F0390 Unspecified dementia without behavioral disturbance: Secondary | ICD-10-CM | POA: Diagnosis present

## 2015-01-31 DIAGNOSIS — Z808 Family history of malignant neoplasm of other organs or systems: Secondary | ICD-10-CM | POA: Diagnosis not present

## 2015-01-31 LAB — BASIC METABOLIC PANEL
Anion gap: 10 (ref 5–15)
BUN: 21 mg/dL — AB (ref 6–20)
CALCIUM: 8.8 mg/dL — AB (ref 8.9–10.3)
CO2: 24 mmol/L (ref 22–32)
CREATININE: 1.49 mg/dL — AB (ref 0.44–1.00)
Chloride: 106 mmol/L (ref 101–111)
GFR calc Af Amer: 36 mL/min — ABNORMAL LOW (ref >60)
GFR, EST NON AFRICAN AMERICAN: 31 mL/min — AB (ref >60)
GLUCOSE: 247 mg/dL — AB (ref 65–99)
Potassium: 4.5 mmol/L (ref 3.5–5.1)
Sodium: 140 mmol/L (ref 135–145)

## 2015-01-31 LAB — GLUCOSE, CAPILLARY
GLUCOSE-CAPILLARY: 220 mg/dL — AB (ref 65–99)
GLUCOSE-CAPILLARY: 225 mg/dL — AB (ref 65–99)
Glucose-Capillary: 183 mg/dL — ABNORMAL HIGH (ref 65–99)
Glucose-Capillary: 231 mg/dL — ABNORMAL HIGH (ref 65–99)
Glucose-Capillary: 186 mg/dL — ABNORMAL HIGH (ref 65–99)

## 2015-01-31 LAB — CBC
HCT: 35.8 % — ABNORMAL LOW (ref 36.0–46.0)
Hemoglobin: 11.4 g/dL — ABNORMAL LOW (ref 12.0–15.0)
MCH: 28.9 pg (ref 26.0–34.0)
MCHC: 31.8 g/dL (ref 30.0–36.0)
MCV: 90.9 fL (ref 78.0–100.0)
PLATELETS: 328 10*3/uL (ref 150–400)
RBC: 3.94 MIL/uL (ref 3.87–5.11)
RDW: 14.3 % (ref 11.5–15.5)
WBC: 8.2 10*3/uL (ref 4.0–10.5)

## 2015-01-31 LAB — I-STAT TROPONIN, ED: Troponin i, poc: 0.01 ng/mL (ref 0.00–0.08)

## 2015-01-31 MED ORDER — ALBUTEROL SULFATE (2.5 MG/3ML) 0.083% IN NEBU
2.5000 mg | INHALATION_SOLUTION | Freq: Once | RESPIRATORY_TRACT | Status: AC
  Administered 2015-01-31: 2.5 mg via RESPIRATORY_TRACT
  Filled 2015-01-31: qty 3

## 2015-01-31 MED ORDER — AZITHROMYCIN 250 MG PO TABS
500.0000 mg | ORAL_TABLET | Freq: Every day | ORAL | Status: AC
  Administered 2015-01-31: 500 mg via ORAL
  Filled 2015-01-31: qty 2

## 2015-01-31 MED ORDER — ALUM & MAG HYDROXIDE-SIMETH 200-200-20 MG/5ML PO SUSP: 30.0000 mL | Freq: Four times a day (QID) | ORAL | Status: DC | PRN

## 2015-01-31 MED ORDER — ALPRAZOLAM 0.25 MG PO TABS
0.2500 mg | ORAL_TABLET | Freq: Three times a day (TID) | ORAL | Status: DC
  Administered 2015-01-31 – 2015-02-03 (×10): 0.25 mg via ORAL
  Filled 2015-01-31 (×10): qty 1

## 2015-01-31 MED ORDER — ALBUTEROL SULFATE (2.5 MG/3ML) 0.083% IN NEBU: 2.5000 mg | INHALATION_SOLUTION | RESPIRATORY_TRACT | Status: DC | PRN

## 2015-01-31 MED ORDER — POTASSIUM CHLORIDE CRYS ER 20 MEQ PO TBCR
20.0000 meq | EXTENDED_RELEASE_TABLET | Freq: Two times a day (BID) | ORAL | Status: DC
  Administered 2015-01-31 – 2015-02-03 (×7): 20 meq via ORAL
  Filled 2015-01-31 (×8): qty 1

## 2015-01-31 MED ORDER — CETYLPYRIDINIUM CHLORIDE 0.05 % MT LIQD: 7.0000 mL | Freq: Two times a day (BID) | OROMUCOSAL | Status: DC

## 2015-01-31 MED ORDER — FUROSEMIDE 40 MG PO TABS
40.0000 mg | ORAL_TABLET | Freq: Every day | ORAL | Status: DC
  Administered 2015-01-31 – 2015-02-03 (×4): 40 mg via ORAL
  Filled 2015-01-31 (×4): qty 1

## 2015-01-31 MED ORDER — ONDANSETRON HCL 4 MG/2ML IJ SOLN: 4.0000 mg | Freq: Four times a day (QID) | INTRAMUSCULAR | Status: DC | PRN

## 2015-01-31 MED ORDER — GABAPENTIN 300 MG PO CAPS
300.0000 mg | ORAL_CAPSULE | Freq: Three times a day (TID) | ORAL | Status: DC
  Administered 2015-01-31 – 2015-02-03 (×10): 300 mg via ORAL
  Filled 2015-01-31 (×10): qty 1

## 2015-01-31 MED ORDER — AMLODIPINE BESYLATE 5 MG PO TABS
5.0000 mg | ORAL_TABLET | Freq: Every day | ORAL | Status: DC
  Administered 2015-01-31 – 2015-02-03 (×4): 5 mg via ORAL
  Filled 2015-01-31 (×4): qty 1

## 2015-01-31 MED ORDER — AZITHROMYCIN 250 MG PO TABS
250.0000 mg | ORAL_TABLET | Freq: Every day | ORAL | Status: DC
  Administered 2015-02-01 – 2015-02-03 (×3): 250 mg via ORAL
  Filled 2015-01-31 (×3): qty 1

## 2015-01-31 MED ORDER — PREDNISONE 10 MG PO TABS: 40.0000 mg | ORAL_TABLET | Freq: Every day | ORAL | Status: DC

## 2015-01-31 MED ORDER — SODIUM CHLORIDE 0.9% FLUSH
3.0000 mL | Freq: Two times a day (BID) | INTRAVENOUS | Status: DC
  Administered 2015-01-31 – 2015-02-02 (×6): 3 mL via INTRAVENOUS
  Filled 2015-01-31 (×6): qty 3

## 2015-01-31 MED ORDER — LATANOPROST 0.005 % OP SOLN
1.0000 [drp] | Freq: Every day | OPHTHALMIC | Status: DC
  Administered 2015-01-31 – 2015-02-02 (×3): 1 [drp] via OPHTHALMIC
  Filled 2015-01-31: qty 2.5

## 2015-01-31 MED ORDER — IPRATROPIUM BROMIDE 0.02 % IN SOLN: 0.5000 mg | Freq: Four times a day (QID) | RESPIRATORY_TRACT | Status: DC

## 2015-01-31 MED ORDER — INSULIN ASPART 100 UNIT/ML ~~LOC~~ SOLN
0.0000 [IU] | Freq: Three times a day (TID) | SUBCUTANEOUS | Status: DC
  Administered 2015-01-31 (×2): 3 [IU] via SUBCUTANEOUS
  Administered 2015-01-31: 2 [IU] via SUBCUTANEOUS
  Administered 2015-02-01 (×3): 3 [IU] via SUBCUTANEOUS
  Administered 2015-02-02 (×2): 2 [IU] via SUBCUTANEOUS
  Administered 2015-02-02: 5 [IU] via SUBCUTANEOUS
  Administered 2015-02-03: 1 [IU] via SUBCUTANEOUS

## 2015-01-31 MED ORDER — ALBUTEROL SULFATE (2.5 MG/3ML) 0.083% IN NEBU: 2.5000 mg | INHALATION_SOLUTION | Freq: Four times a day (QID) | RESPIRATORY_TRACT | Status: DC

## 2015-01-31 MED ORDER — LEVETIRACETAM 250 MG PO TABS
250.0000 mg | ORAL_TABLET | Freq: Two times a day (BID) | ORAL | Status: DC
  Administered 2015-01-31 – 2015-02-03 (×7): 250 mg via ORAL
  Filled 2015-01-31 (×8): qty 1

## 2015-01-31 MED ORDER — SODIUM CHLORIDE 0.9% FLUSH
3.0000 mL | INTRAVENOUS | Status: DC | PRN
  Filled 2015-01-31: qty 3

## 2015-01-31 MED ORDER — IPRATROPIUM-ALBUTEROL 0.5-2.5 (3) MG/3ML IN SOLN
3.0000 mL | Freq: Four times a day (QID) | RESPIRATORY_TRACT | Status: DC
  Administered 2015-01-31 – 2015-02-01 (×5): 3 mL via RESPIRATORY_TRACT
  Filled 2015-01-31 (×5): qty 3

## 2015-01-31 MED ORDER — ONDANSETRON HCL 4 MG PO TABS: 4.0000 mg | ORAL_TABLET | Freq: Four times a day (QID) | ORAL | Status: DC | PRN

## 2015-01-31 MED ORDER — FLUOXETINE HCL 20 MG PO CAPS
20.0000 mg | ORAL_CAPSULE | Freq: Every day | ORAL | Status: DC
  Administered 2015-01-31 – 2015-02-03 (×4): 20 mg via ORAL
  Filled 2015-01-31 (×4): qty 1

## 2015-01-31 MED ORDER — IPRATROPIUM-ALBUTEROL 0.5-2.5 (3) MG/3ML IN SOLN
3.0000 mL | Freq: Once | RESPIRATORY_TRACT | Status: AC
  Administered 2015-01-31: 3 mL via RESPIRATORY_TRACT
  Filled 2015-01-31: qty 3

## 2015-01-31 MED ORDER — METHYLPREDNISOLONE SODIUM SUCC 125 MG IJ SOLR
80.0000 mg | Freq: Two times a day (BID) | INTRAMUSCULAR | Status: DC
  Administered 2015-01-31 (×2): 80 mg via INTRAVENOUS
  Filled 2015-01-31 (×3): qty 2

## 2015-01-31 MED ORDER — CLOPIDOGREL BISULFATE 75 MG PO TABS
75.0000 mg | ORAL_TABLET | Freq: Every day | ORAL | Status: DC
  Administered 2015-01-31 – 2015-02-03 (×4): 75 mg via ORAL
  Filled 2015-01-31 (×4): qty 1

## 2015-01-31 MED ORDER — HYDROCODONE-ACETAMINOPHEN 5-325 MG PO TABS
1.0000 | ORAL_TABLET | Freq: Four times a day (QID) | ORAL | Status: DC | PRN
  Administered 2015-02-02: 1 via ORAL
  Filled 2015-01-31: qty 1

## 2015-01-31 MED ORDER — SODIUM CHLORIDE 0.9 % IV SOLN: 250.0000 mL | INTRAVENOUS | Status: DC | PRN

## 2015-01-31 MED ORDER — GUAIFENESIN ER 600 MG PO TB12
600.0000 mg | ORAL_TABLET | Freq: Two times a day (BID) | ORAL | Status: DC
  Administered 2015-01-31 – 2015-02-03 (×8): 600 mg via ORAL
  Filled 2015-01-31 (×8): qty 1

## 2015-01-31 MED ORDER — LOSARTAN POTASSIUM 50 MG PO TABS
100.0000 mg | ORAL_TABLET | Freq: Every day | ORAL | Status: DC
  Administered 2015-01-31 – 2015-02-03 (×4): 100 mg via ORAL
  Filled 2015-01-31 (×4): qty 2

## 2015-01-31 NOTE — Care Management Obs Status (Signed)
[place] NOTIFICATION   Patient Details  Name: Tiffany Hartman MRN: [id number] Date of Birth: [birth year]   Medicare Observation Status Notification Given:  Yes    Alvie Heidelberg, RN 01/31/2015, 11:18 AM

## 2015-01-31 NOTE — Care Management (Signed)
Spoke with patient who is from home with daughter. She is alert and oriented and answers question appropriately. Patient stated that she has been doing well until recently. Has been having difficult breathing and came to emergency room. Stated that she hasn't been urinating well.   Patient has tremors.   Will continue to follow for needs. Anticipate the possibility of Home Health RN upon discharge.

## 2015-01-31 NOTE — Progress Notes (Signed)
Subjective: Patient was admitted last night due to wheezing and shortness of breath. She was started on IV steroid and nebulizer treatment. Patient  Feels much better today. No fever or chills.  Objective: Vital signs in last 24 hours: Temp:  [97.4 F (36.3 C)-97.9 F (36.6 C)] 97.9 F (36.6 C) (01/25 0254) Pulse Rate:  [65-82] 82 (01/25 0254) Resp:  [14-24] 20 (01/25 0254) BP: (107-132)/(50-63) 125/60 mmHg (01/25 1015) SpO2:  [95 %-100 %] 95 % (01/25 0853) Weight:  [92.987 kg (205 lb)] 92.987 kg (205 lb) (01/24 2107) Weight change:  Last BM Date: 01/29/15  Intake/Output from previous day:    PHYSICAL EXAM General appearance: alert and no distress Resp: diminished breath sounds bilaterally and rhonchi bilaterally Cardio: S1, S2 normal GI: soft, non-tender; bowel sounds normal; no masses,  no organomegaly Extremities: extremities normal, atraumatic, no cyanosis or edema  Lab Results:  Results for orders placed or performed during the hospital encounter of 01/30/15 (from the past 48 hour(s))  CBC with Differential     Status: Abnormal   Collection Time: 01/30/15  9:38 PM  Result Value Ref Range   WBC 5.9 4.0 - 10.5 K/uL   RBC 3.84 (L) 3.87 - 5.11 MIL/uL   Hemoglobin 11.0 (L) 12.0 - 15.0 g/dL   HCT 34.6 (L) 36.0 - 46.0 %   MCV 90.1 78.0 - 100.0 fL   MCH 28.6 26.0 - 34.0 pg   MCHC 31.8 30.0 - 36.0 g/dL   RDW 14.3 11.5 - 15.5 %   Platelets 320 150 - 400 K/uL   Neutrophils Relative % 53 %   Neutro Abs 3.1 1.7 - 7.7 K/uL   Lymphocytes Relative 35 %   Lymphs Abs 2.1 0.7 - 4.0 K/uL   Monocytes Relative 5 %   Monocytes Absolute 0.3 0.1 - 1.0 K/uL   Eosinophils Relative 6 %   Eosinophils Absolute 0.3 0.0 - 0.7 K/uL   Basophils Relative 1 %   Basophils Absolute 0.1 0.0 - 0.1 K/uL  Comprehensive metabolic panel     Status: Abnormal   Collection Time: 01/30/15  9:38 PM  Result Value Ref Range   Sodium 142 135 - 145 mmol/L   Potassium 3.8 3.5 - 5.1 mmol/L   Chloride 110 101  - 111 mmol/L   CO2 24 22 - 32 mmol/L   Glucose, Bld 118 (H) 65 - 99 mg/dL   BUN 15 6 - 20 mg/dL   Creatinine, Ser 1.33 (H) 0.44 - 1.00 mg/dL   Calcium 8.7 (L) 8.9 - 10.3 mg/dL   Total Protein 6.4 (L) 6.5 - 8.1 g/dL   Albumin 3.5 3.5 - 5.0 g/dL   AST 14 (L) 15 - 41 U/L   ALT 10 (L) 14 - 54 U/L   Alkaline Phosphatase 58 38 - 126 U/L   Total Bilirubin 0.1 (L) 0.3 - 1.2 mg/dL   GFR calc non Af Amer 35 (L) >60 mL/min   GFR calc Af Amer 41 (L) >60 mL/min    Comment: (NOTE) The eGFR has been calculated using the CKD EPI equation. This calculation has not been validated in all clinical situations. eGFR's persistently <60 mL/min signify possible Chronic Kidney Disease.    Anion gap 8 5 - 15  Brain natriuretic peptide     Status: None   Collection Time: 01/30/15  9:38 PM  Result Value Ref Range   B Natriuretic Peptide 48.0 0.0 - 100.0 pg/mL  Troponin I     Status: None  Collection Time: 01/30/15  9:38 PM  Result Value Ref Range   Troponin I <0.03 <0.031 ng/mL    Comment:        NO INDICATION OF MYOCARDIAL INJURY.   I-Stat Troponin, ED (not at [facility])     Status: None   Collection Time: 01/31/15 12:37 AM  Result Value Ref Range   Troponin i, poc 0.01 0.00 - 0.08 ng/mL   Comment 3            Comment: Due to the release kinetics of cTnI, a negative result within the first hours of the onset of symptoms does not rule out myocardial infarction with certainty. If myocardial infarction is still suspected, repeat the test at appropriate intervals.   Glucose, capillary     Status: Abnormal   Collection Time: 01/31/15  3:08 AM  Result Value Ref Range   Glucose-Capillary 183 (H) 65 - 99 mg/dL  Basic metabolic panel     Status: Abnormal   Collection Time: 01/31/15  6:02 AM  Result Value Ref Range   Sodium 140 135 - 145 mmol/L   Potassium 4.5 3.5 - 5.1 mmol/L   Chloride 106 101 - 111 mmol/L   CO2 24 22 - 32 mmol/L   Glucose, Bld 247 (H) 65 - 99 mg/dL   BUN 21 (H) 6 - 20 mg/dL    Creatinine, Ser 1.49 (H) 0.44 - 1.00 mg/dL   Calcium 8.8 (L) 8.9 - 10.3 mg/dL   GFR calc non Af Amer 31 (L) >60 mL/min   GFR calc Af Amer 36 (L) >60 mL/min    Comment: (NOTE) The eGFR has been calculated using the CKD EPI equation. This calculation has not been validated in all clinical situations. eGFR's persistently <60 mL/min signify possible Chronic Kidney Disease.    Anion gap 10 5 - 15  CBC     Status: Abnormal   Collection Time: 01/31/15  6:02 AM  Result Value Ref Range   WBC 8.2 4.0 - 10.5 K/uL   RBC 3.94 3.87 - 5.11 MIL/uL   Hemoglobin 11.4 (L) 12.0 - 15.0 g/dL   HCT 35.8 (L) 36.0 - 46.0 %   MCV 90.9 78.0 - 100.0 fL   MCH 28.9 26.0 - 34.0 pg   MCHC 31.8 30.0 - 36.0 g/dL   RDW 14.3 11.5 - 15.5 %   Platelets 328 150 - 400 K/uL  Glucose, capillary     Status: Abnormal   Collection Time: 01/31/15  7:37 AM  Result Value Ref Range   Glucose-Capillary 220 (H) 65 - 99 mg/dL   Comment 1 Notify RN   Glucose, capillary     Status: Abnormal   Collection Time: 01/31/15 11:35 AM  Result Value Ref Range   Glucose-Capillary 231 (H) 65 - 99 mg/dL   Comment 1 Notify RN    Comment 2 Document in Chart     ABGS No results for input(s): PHART, PO2ART, TCO2, HCO3 in the last 72 hours.  Invalid input(s): PCO2 CULTURES No results found for this or any previous visit (from the past 240 hour(s)). Studies/Results: Dg Chest 2 View  01/30/2015  CLINICAL DATA:  Acute onset of shortness of breath and congestion. Initial encounter. EXAM: CHEST  2 VIEW COMPARISON:  Chest radiograph and CTA of the chest performed 08/17/2014 FINDINGS: The lungs are well-aerated. Mild vascular congestion is noted. There is no evidence of focal opacification, pleural effusion or pneumothorax. The heart is normal in size; the mediastinal contour is within normal limits.  No acute osseous abnormalities are seen. IMPRESSION: Mild vascular congestion noted.  Lungs remain grossly clear. Electronically Signed   By: Garald Balding M.D.   On: 01/30/2015 22:53    Medications: I have reviewed the patient's current medications.  Assesment:   Principal Problem:   Asthma exacerbation Active Problems:   History of stroke   Parkinsons disease ([place])   Seizure disorder ([place])   Dyspnea   Diabetes ([place])   Asthma with status asthmaticus    Plan:  Medications reviewed Will continue nebulizer and iv steroid Continue regular medications CBC/BMP      Mahogony Gilchrest 01/31/2015, 11:48 AM

## 2015-01-31 NOTE — H&P (Signed)
PCP:   FANTA,TESFAYE, MD   Chief Complaint:  Sob, wheezing  HPI: 82 yo female with several days of progressive worsening sob, wheezing and dry nonproductive cough.  Denies any swelling in her legs.  No fevers.  Not frequently on abx.  Pt has h/o asthma.  Referred for admission for asthma exacerbation.  Pt feels better after recieivng several nebs and oral prednisone but then gets more dyspneic with any activity.  Review of Systems:  Positive and negative as per HPI otherwise all other systems are negative  Past Medical History: Past Medical History  Diagnosis Date  . Dementia   . Bronchitis   . Asthma   . Diabetes mellitus ([place])   . Parkinson disease ([place])   . HTN (hypertension)   . OA (osteoarthritis)   . Thyroid nodule     Dr Legrand Rams  . Glaucoma   . Diverticulitis 2009  . Breast cancer ([place]) 02/20/83    LT mastectomy  . Obesity 08/17/2014  . Stroke [facility])    Past Surgical History  Procedure Laterality Date  . Abdominal hysterectomy      complete  . Joint replacement  1998/1999    knee boths  . Mastectomy  02/20/1983    left   . Fracture surgery  2008     right and left femurs  . Appendectomy    .  bilateral catracts    . Eus  09/10/2011    Procedure: UPPER ENDOSCOPIC ULTRASOUND (EUS) RADIAL;  Surgeon: Arta Silence, MD;  Location: [facility] ENDOSCOPY;  Service: Endoscopy;  Laterality: N/A;  christina/ebp  . Fine needle aspiration  09/10/2011    Procedure: FINE NEEDLE ASPIRATION (FNA) LINEAR;  Surgeon: Arta Silence, MD;  Location: [facility] ENDOSCOPY;  Service: Endoscopy;  Laterality: N/A;  . Knee surgery Right     TKA  . Knee surgery Left     TKA  . Knee surgery Right     Periprosthetic fracture /otif    Medications: Prior to Admission medications   Medication Sig Start Date End Date Taking? Authorizing Provider  albuterol (PROVENTIL HFA;VENTOLIN HFA) 108 (90 Base) MCG/ACT inhaler Inhale 1-2 puffs into the lungs every 6 (six) hours as needed for wheezing or shortness of  breath.   Yes Historical Provider, MD  ALPRAZolam (XANAX) 0.25 MG tablet Take 0.25 mg by mouth 3 (three) times daily.    Yes Historical Provider, MD  amLODipine (NORVASC) 5 MG tablet Take 5 mg by mouth daily.   Yes Historical Provider, MD  Calcium Carbonate-Vitamin D (CALCIUM + D) 600-200 MG-UNIT TABS Take 1 tablet by mouth every evening.    Yes Historical Provider, MD  clopidogrel (PLAVIX) 75 MG tablet Take 1 tablet (75 mg total) by mouth daily with breakfast. 08/16/12  Yes Rosita Fire, MD  donepezil (ARICEPT) 10 MG tablet Take 10 mg by mouth at bedtime.   Yes Historical Provider, MD  ferrous gluconate (FERGON) 325 MG tablet Take 325 mg by mouth daily with breakfast.   Yes Historical Provider, MD  FLUoxetine (PROZAC) 20 MG capsule Take 20 mg by mouth daily.   Yes Historical Provider, MD  folic acid (FOLVITE) 1 MG tablet Take 1 mg by mouth daily.   Yes Historical Provider, MD  furosemide (LASIX) 40 MG tablet Take 40 mg by mouth daily.    Yes Historical Provider, MD  gabapentin (NEURONTIN) 300 MG capsule Take 1 capsule (300 mg total) by mouth 3 (three) times daily. 06/02/12  Yes Carole Civil, MD  HYDROcodone-acetaminophen (NORCO/VICODIN) [phone]  MG tablet Take 1 tablet by mouth every 6 (six) hours as needed for moderate pain. 01/04/15  Yes Carole Civil, MD  ipratropium-albuterol (DUONEB) 0.5-2.5 (3) MG/3ML SOLN Take 3 mLs by nebulization every 4 (four) hours. Patient taking differently: Take 3 mLs by nebulization 4 (four) times daily.  08/22/14  Yes Rosita Fire, MD  latanoprost (XALATAN) 0.005 % ophthalmic solution Place 1 drop into both eyes at bedtime.   Yes Historical Provider, MD  levETIRAcetam (KEPPRA) 250 MG tablet Take 250 mg by mouth 2 (two) times daily.   Yes Historical Provider, MD  losartan (COZAAR) 100 MG tablet Take 100 mg by mouth daily.   Yes Historical Provider, MD  lovastatin (MEVACOR) 10 MG tablet Take 10 mg by mouth at bedtime.   Yes Historical Provider, MD  omeprazole  (PRILOSEC) 20 MG capsule Take 20 mg by mouth 2 (two) times daily.    Yes Historical Provider, MD  potassium chloride SA (K-DUR,KLOR-CON) 20 MEQ tablet Take 20 mEq by mouth 2 (two) times daily.   Yes Historical Provider, MD  predniSONE (DELTASONE) 10 MG tablet Take 4 tablets (40 mg total) by mouth daily. 01/31/15   Forde Dandy, MD    Allergies:  No Known Allergies  Social History:  reports that she has never smoked. She has never used smokeless tobacco. She reports that she does not drink alcohol or use illicit drugs.  Family History: Family History  Problem Relation Age of Onset  . Colon polyps Daughter 54  . Stroke Father   . Huntington's disease Mother   . Throat cancer Son     Physical Exam: Filed Vitals:   01/31/15 0119 01/31/15 0130 01/31/15 0230 01/31/15 0254  BP:  131/62 124/63 127/58  Pulse:  80 82 82  Temp:    97.9 F (36.6 C)  TempSrc:    Oral  Resp:  23 14 20   Height:      Weight:      SpO2: 100% 98% 99% 99%   General appearance: alert, cooperative and no distress Head: Normocephalic, without obvious abnormality, atraumatic Eyes: negative Nose: Nares normal. Septum midline. Mucosa normal. No drainage or sinus tenderness. Neck: no JVD and supple, symmetrical, trachea midline Lungs: diminished breath sounds bilaterally Heart: regular rate and rhythm, S1, S2 normal, no murmur, click, rub or gallop Abdomen: soft, non-tender; bowel sounds normal; no masses,  no organomegaly Extremities: extremities normal, atraumatic, no cyanosis or edema Pulses: 2+ and symmetric Skin: Skin color, texture, turgor normal. No rashes or lesions Neurologic: Grossly normal   Labs on Admission:   Recent Labs  01/30/15 2138  NA 142  K 3.8  CL 110  CO2 24  GLUCOSE 118*  BUN 15  CREATININE 1.33*  CALCIUM 8.7*    Recent Labs  01/30/15 2138  AST 14*  ALT 10*  ALKPHOS 58  BILITOT 0.1*  PROT 6.4*  ALBUMIN 3.5    Recent Labs  01/30/15 2138  WBC 5.9  NEUTROABS 3.1   HGB 11.0*  HCT 34.6*  MCV 90.1  PLT 320    Recent Labs  01/30/15 2138  TROPONINI <0.03   Radiological Exams on Admission: Dg Chest 2 View  01/30/2015  CLINICAL DATA:  Acute onset of shortness of breath and congestion. Initial encounter. EXAM: CHEST  2 VIEW COMPARISON:  Chest radiograph and CTA of the chest performed 08/17/2014 FINDINGS: The lungs are well-aerated. Mild vascular congestion is noted. There is no evidence of focal opacification, pleural effusion or pneumothorax. The heart  is normal in size; the mediastinal contour is within normal limits. No acute osseous abnormalities are seen. IMPRESSION: Mild vascular congestion noted.  Lungs remain grossly clear. Electronically Signed   By: Garald Balding M.D.   On: 01/30/2015 22:53    Assessment/Plan  82 yo female with acute asthma exacerbation  Principal Problem:   Asthma exacerbation-  Give solumedrol 80mg  iv q 12 hours.  freq nebs.  Zpack.    Active Problems:   History of stroke- noted   Parkinsons disease ([place])- noted   Seizure disorder ([place])-  Cont home meds, seizure precautions   Dyspnea-due to above   Diabetes ([place])-  Place on ssi, pt says her glucose goes up when she is on steroids, but she is usually diet controlled.  Consider short taper at d/c.   obs on medical bed.  Full code.  PCP is dr Legrand Rams.   Ashawnti Tangen A 01/31/2015, 3:34 AM

## 2015-01-31 NOTE — ED Notes (Signed)
Pt walked very short distance and became short of breath, however o2 stayed between 94-100%

## 2015-01-31 NOTE — Discharge Instructions (Signed)
Return without fail for worsening symptoms, including worsening pain, difficulty breathing, fever, or any other symptoms concerning to you. Continue to give yourself breathing treatments as needed. Please have re-evaluation from your PCP in 1-2 days.  Asthma, Acute Bronchospasm Acute bronchospasm caused by asthma is also referred to as an asthma attack. Bronchospasm means your air passages become narrowed. The narrowing is caused by inflammation and tightening of the muscles in the air tubes (bronchi) in your lungs. This can make it hard to breathe or cause you to wheeze and cough. CAUSES Possible triggers are:  Animal dander from the skin, hair, or feathers of animals.  Dust mites contained in house dust.  Cockroaches.  Pollen from trees or grass.  Mold.  Cigarette or tobacco smoke.  Air pollutants such as dust, household cleaners, hair sprays, aerosol sprays, paint fumes, strong chemicals, or strong odors.  Cold air or weather changes. Cold air may trigger inflammation. Winds increase molds and pollens in the air.  Strong emotions such as crying or laughing hard.  Stress.  Certain medicines such as aspirin or beta-blockers.  Sulfites in foods and drinks, such as dried fruits and wine.  Infections or inflammatory conditions, such as a flu, cold, or inflammation of the nasal membranes (rhinitis).  Gastroesophageal reflux disease (GERD). GERD is a condition where stomach acid backs up into your esophagus.  Exercise or strenuous activity. SIGNS AND SYMPTOMS   Wheezing.  Excessive coughing, particularly at night.  Chest tightness.  Shortness of breath. DIAGNOSIS  Your health care provider will ask you about your medical history and perform a physical exam. A chest X-ray or blood testing may be performed to look for other causes of your symptoms or other conditions that may have triggered your asthma attack. TREATMENT  Treatment is aimed at reducing inflammation and  opening up the airways in your lungs. Most asthma attacks are treated with inhaled medicines. These include quick relief or rescue medicines (such as bronchodilators) and controller medicines (such as inhaled corticosteroids). These medicines are sometimes given through an inhaler or a nebulizer. Systemic steroid medicine taken by mouth or given through an IV tube also can be used to reduce the inflammation when an attack is moderate or severe. Antibiotic medicines are only used if a bacterial infection is present.  HOME CARE INSTRUCTIONS   Rest.  Drink plenty of liquids. This helps the mucus to remain thin and be easily coughed up. Only use caffeine in moderation and do not use alcohol until you have recovered from your illness.  Do not smoke. Avoid being exposed to secondhand smoke.  You play a critical role in keeping yourself in good health. Avoid exposure to things that cause you to wheeze or to have breathing problems.  Keep your medicines up-to-date and available. Carefully follow your health care provider's treatment plan.  Take your medicine exactly as prescribed.  When pollen or pollution is bad, keep windows closed and use an air conditioner or go to places with air conditioning.  Asthma requires careful medical care. See your health care provider for a follow-up as advised. If you are more than [redacted] weeks pregnant and you were prescribed any new medicines, let your obstetrician know about the visit and how you are doing. Follow up with your health care provider as directed.  After you have recovered from your asthma attack, make an appointment with your outpatient doctor to talk about ways to reduce the likelihood of future attacks. If you do not have a doctor  who manages your asthma, make an appointment with a primary care doctor to discuss your asthma. SEEK IMMEDIATE MEDICAL CARE IF:   You are getting worse.  You have trouble breathing. If severe, call your local emergency  services (911 in the [place]  You develop chest pain or discomfort.  You are vomiting.  You are not able to keep fluids down.  You are coughing up yellow, green, brown, or bloody sputum.  You have a fever and your symptoms suddenly get worse.  You have trouble swallowing. MAKE SURE YOU:   Understand these instructions.  Will watch your condition.  Will get help right away if you are not doing well or get worse.   This information is not intended to replace advice given to you by your health care provider. Make sure you discuss any questions you have with your health care provider.   Document Released: 04/09/2006 Document Revised: 12/28/2012 Document Reviewed: 06/30/2012 Elsevier Interactive Patient Education [employer].

## 2015-02-01 LAB — CBC
HCT: 38 % (ref 36.0–46.0)
Hemoglobin: 12.3 g/dL (ref 12.0–15.0)
MCH: 29.1 pg (ref 26.0–34.0)
MCHC: 32.4 g/dL (ref 30.0–36.0)
MCV: 89.8 fL (ref 78.0–100.0)
PLATELETS: 353 10*3/uL (ref 150–400)
RBC: 4.23 MIL/uL (ref 3.87–5.11)
RDW: 14.3 % (ref 11.5–15.5)
WBC: 13.5 10*3/uL — AB (ref 4.0–10.5)

## 2015-02-01 LAB — GLUCOSE, CAPILLARY
GLUCOSE-CAPILLARY: 229 mg/dL — AB (ref 65–99)
GLUCOSE-CAPILLARY: 210 mg/dL — AB (ref 65–99)
Glucose-Capillary: 236 mg/dL — ABNORMAL HIGH (ref 65–99)
Glucose-Capillary: 253 mg/dL — ABNORMAL HIGH (ref 65–99)

## 2015-02-01 LAB — BASIC METABOLIC PANEL
ANION GAP: 7 (ref 5–15)
BUN: 28 mg/dL — ABNORMAL HIGH (ref 6–20)
CALCIUM: 9.1 mg/dL (ref 8.9–10.3)
CO2: 25 mmol/L (ref 22–32)
Chloride: 106 mmol/L (ref 101–111)
Creatinine, Ser: 1.11 mg/dL — ABNORMAL HIGH (ref 0.44–1.00)
GFR, EST AFRICAN AMERICAN: 51 mL/min — AB (ref >60)
GFR, EST NON AFRICAN AMERICAN: 44 mL/min — AB (ref >60)
Glucose, Bld: 274 mg/dL — ABNORMAL HIGH (ref 65–99)
POTASSIUM: 4.9 mmol/L (ref 3.5–5.1)
SODIUM: 138 mmol/L (ref 135–145)

## 2015-02-01 MED ORDER — IPRATROPIUM-ALBUTEROL 0.5-2.5 (3) MG/3ML IN SOLN
3.0000 mL | Freq: Three times a day (TID) | RESPIRATORY_TRACT | Status: DC
  Administered 2015-02-01 – 2015-02-03 (×6): 3 mL via RESPIRATORY_TRACT
  Filled 2015-02-01 (×6): qty 3

## 2015-02-01 MED ORDER — ALPRAZOLAM 0.5 MG PO TABS
0.5000 mg | ORAL_TABLET | Freq: Once | ORAL | Status: AC
  Administered 2015-02-01: 0.5 mg via ORAL
  Filled 2015-02-01: qty 1

## 2015-02-01 MED ORDER — METHYLPREDNISOLONE SODIUM SUCC 125 MG IJ SOLR
60.0000 mg | Freq: Two times a day (BID) | INTRAMUSCULAR | Status: DC
  Administered 2015-02-01 – 2015-02-02 (×2): 60 mg via INTRAVENOUS
  Filled 2015-02-01 (×2): qty 2

## 2015-02-01 NOTE — Progress Notes (Signed)
Inpatient Diabetes Program Recommendations  AACE/ADA: New Consensus Statement on Inpatient Glycemic Control (2015)  Target Ranges:  Prepandial:   less than 140 mg/dL      Peak postprandial:   less than 180 mg/dL (1-2 hours)      Critically ill patients:  140 - 180 mg/dL   Review of Glycemic Control:  Results for KYNSLIE, CALITRI (MRN [id number]) as of 02/01/2015 13:54  Ref. Range 01/31/2015 11:35 01/31/2015 16:14 01/31/2015 21:09 02/01/2015 07:35 02/01/2015 11:38  Glucose-Capillary Latest Ref Range: 65-99 mg/dL 231 (H) 186 (H) 225 (H) 229 (H) 236 (H)    Diabetes history: Diabetes Outpatient Diabetes medications: None Current orders for Inpatient glycemic control:  Novolog sensitive tid with meals, Solumedrol 60 mg IV q 12 hours  Inpatient Diabetes Program Recommendations:    Note that CBG's elevated with IV steroids.  If CBG's remain elevated, consider adding Levemir 10 units daily while patient is in the hospital.  Thanks, Adah Perl, RN, BC-ADM Inpatient Diabetes Coordinator Pager [phone] (8a-5p)

## 2015-02-01 NOTE — Progress Notes (Signed)
Subjective: Patient feels better. Her breathing is improving. No fever or chills Objective: Vital signs in last 24 hours: Temp:  [97.8 F (36.6 C)-98.5 F (36.9 C)] 97.8 F (36.6 C) (01/26 0557) Pulse Rate:  [66-83] 66 (01/26 0557) Resp:  [18-20] 19 (01/26 0557) BP: (123-132)/(60-89) 123/64 mmHg (01/26 0557) SpO2:  [95 %-100 %] 99 % (01/26 0738) Weight:  [102.1 kg (225 lb 1.4 oz)] 102.1 kg (225 lb 1.4 oz) (01/26 0557) Weight change: 9.112 kg (20 lb 1.4 oz) Last BM Date: 01/31/15  Intake/Output from previous day: 01/25 0701 - 01/26 0700 In: 723 [P.O.:720; I.V.:3] Out: -   PHYSICAL EXAM General appearance: alert and no distress Resp: diminished breath sounds bilaterally and rhonchi bilaterally Cardio: S1, S2 normal GI: soft, non-tender; bowel sounds normal; no masses,  no organomegaly Extremities: extremities normal, atraumatic, no cyanosis or edema  Lab Results:  Results for orders placed or performed during the hospital encounter of 01/30/15 (from the past 48 hour(s))  CBC with Differential     Status: Abnormal   Collection Time: 01/30/15  9:38 PM  Result Value Ref Range   WBC 5.9 4.0 - 10.5 K/uL   RBC 3.84 (L) 3.87 - 5.11 MIL/uL   Hemoglobin 11.0 (L) 12.0 - 15.0 g/dL   HCT 34.6 (L) 36.0 - 46.0 %   MCV 90.1 78.0 - 100.0 fL   MCH 28.6 26.0 - 34.0 pg   MCHC 31.8 30.0 - 36.0 g/dL   RDW 14.3 11.5 - 15.5 %   Platelets 320 150 - 400 K/uL   Neutrophils Relative % 53 %   Neutro Abs 3.1 1.7 - 7.7 K/uL   Lymphocytes Relative 35 %   Lymphs Abs 2.1 0.7 - 4.0 K/uL   Monocytes Relative 5 %   Monocytes Absolute 0.3 0.1 - 1.0 K/uL   Eosinophils Relative 6 %   Eosinophils Absolute 0.3 0.0 - 0.7 K/uL   Basophils Relative 1 %   Basophils Absolute 0.1 0.0 - 0.1 K/uL  Comprehensive metabolic panel     Status: Abnormal   Collection Time: 01/30/15  9:38 PM  Result Value Ref Range   Sodium 142 135 - 145 mmol/L   Potassium 3.8 3.5 - 5.1 mmol/L   Chloride 110 101 - 111 mmol/L   CO2  24 22 - 32 mmol/L   Glucose, Bld 118 (H) 65 - 99 mg/dL   BUN 15 6 - 20 mg/dL   Creatinine, Ser 1.33 (H) 0.44 - 1.00 mg/dL   Calcium 8.7 (L) 8.9 - 10.3 mg/dL   Total Protein 6.4 (L) 6.5 - 8.1 g/dL   Albumin 3.5 3.5 - 5.0 g/dL   AST 14 (L) 15 - 41 U/L   ALT 10 (L) 14 - 54 U/L   Alkaline Phosphatase 58 38 - 126 U/L   Total Bilirubin 0.1 (L) 0.3 - 1.2 mg/dL   GFR calc non Af Amer 35 (L) >60 mL/min   GFR calc Af Amer 41 (L) >60 mL/min    Comment: (NOTE) The eGFR has been calculated using the CKD EPI equation. This calculation has not been validated in all clinical situations. eGFR's persistently <60 mL/min signify possible Chronic Kidney Disease.    Anion gap 8 5 - 15  Brain natriuretic peptide     Status: None   Collection Time: 01/30/15  9:38 PM  Result Value Ref Range   B Natriuretic Peptide 48.0 0.0 - 100.0 pg/mL  Troponin I     Status: None   Collection Time:  01/30/15  9:38 PM  Result Value Ref Range   Troponin I <0.03 <0.031 ng/mL    Comment:        NO INDICATION OF MYOCARDIAL INJURY.   I-Stat Troponin, ED (not at [facility])     Status: None   Collection Time: 01/31/15 12:37 AM  Result Value Ref Range   Troponin i, poc 0.01 0.00 - 0.08 ng/mL   Comment 3            Comment: Due to the release kinetics of cTnI, a negative result within the first hours of the onset of symptoms does not rule out myocardial infarction with certainty. If myocardial infarction is still suspected, repeat the test at appropriate intervals.   Glucose, capillary     Status: Abnormal   Collection Time: 01/31/15  3:08 AM  Result Value Ref Range   Glucose-Capillary 183 (H) 65 - 99 mg/dL  Basic metabolic panel     Status: Abnormal   Collection Time: 01/31/15  6:02 AM  Result Value Ref Range   Sodium 140 135 - 145 mmol/L   Potassium 4.5 3.5 - 5.1 mmol/L   Chloride 106 101 - 111 mmol/L   CO2 24 22 - 32 mmol/L   Glucose, Bld 247 (H) 65 - 99 mg/dL   BUN 21 (H) 6 - 20 mg/dL   Creatinine, Ser 1.49 (H)  0.44 - 1.00 mg/dL   Calcium 8.8 (L) 8.9 - 10.3 mg/dL   GFR calc non Af Amer 31 (L) >60 mL/min   GFR calc Af Amer 36 (L) >60 mL/min    Comment: (NOTE) The eGFR has been calculated using the CKD EPI equation. This calculation has not been validated in all clinical situations. eGFR's persistently <60 mL/min signify possible Chronic Kidney Disease.    Anion gap 10 5 - 15  CBC     Status: Abnormal   Collection Time: 01/31/15  6:02 AM  Result Value Ref Range   WBC 8.2 4.0 - 10.5 K/uL   RBC 3.94 3.87 - 5.11 MIL/uL   Hemoglobin 11.4 (L) 12.0 - 15.0 g/dL   HCT 35.8 (L) 36.0 - 46.0 %   MCV 90.9 78.0 - 100.0 fL   MCH 28.9 26.0 - 34.0 pg   MCHC 31.8 30.0 - 36.0 g/dL   RDW 14.3 11.5 - 15.5 %   Platelets 328 150 - 400 K/uL  Glucose, capillary     Status: Abnormal   Collection Time: 01/31/15  7:37 AM  Result Value Ref Range   Glucose-Capillary 220 (H) 65 - 99 mg/dL   Comment 1 Notify RN   Glucose, capillary     Status: Abnormal   Collection Time: 01/31/15 11:35 AM  Result Value Ref Range   Glucose-Capillary 231 (H) 65 - 99 mg/dL   Comment 1 Notify RN    Comment 2 Document in Chart   Glucose, capillary     Status: Abnormal   Collection Time: 01/31/15  4:14 PM  Result Value Ref Range   Glucose-Capillary 186 (H) 65 - 99 mg/dL   Comment 1 Notify RN   Glucose, capillary     Status: Abnormal   Collection Time: 01/31/15  9:09 PM  Result Value Ref Range   Glucose-Capillary 225 (H) 65 - 99 mg/dL   Comment 1 Notify RN    Comment 2 Document in Chart   CBC     Status: Abnormal   Collection Time: 02/01/15  6:16 AM  Result Value Ref Range   WBC 13.5 (  H) 4.0 - 10.5 K/uL   RBC 4.23 3.87 - 5.11 MIL/uL   Hemoglobin 12.3 12.0 - 15.0 g/dL   HCT 38.0 36.0 - 46.0 %   MCV 89.8 78.0 - 100.0 fL   MCH 29.1 26.0 - 34.0 pg   MCHC 32.4 30.0 - 36.0 g/dL   RDW 14.3 11.5 - 15.5 %   Platelets 353 150 - 400 K/uL  Basic metabolic panel     Status: Abnormal   Collection Time: 02/01/15  6:16 AM  Result Value  Ref Range   Sodium 138 135 - 145 mmol/L   Potassium 4.9 3.5 - 5.1 mmol/L   Chloride 106 101 - 111 mmol/L   CO2 25 22 - 32 mmol/L   Glucose, Bld 274 (H) 65 - 99 mg/dL   BUN 28 (H) 6 - 20 mg/dL   Creatinine, Ser 1.11 (H) 0.44 - 1.00 mg/dL   Calcium 9.1 8.9 - 10.3 mg/dL   GFR calc non Af Amer 44 (L) >60 mL/min   GFR calc Af Amer 51 (L) >60 mL/min    Comment: (NOTE) The eGFR has been calculated using the CKD EPI equation. This calculation has not been validated in all clinical situations. eGFR's persistently <60 mL/min signify possible Chronic Kidney Disease.    Anion gap 7 5 - 15    ABGS No results for input(s): PHART, PO2ART, TCO2, HCO3 in the last 72 hours.  Invalid input(s): PCO2 CULTURES No results found for this or any previous visit (from the past 240 hour(s)). Studies/Results: Dg Chest 2 View  01/30/2015  CLINICAL DATA:  Acute onset of shortness of breath and congestion. Initial encounter. EXAM: CHEST  2 VIEW COMPARISON:  Chest radiograph and CTA of the chest performed 08/17/2014 FINDINGS: The lungs are well-aerated. Mild vascular congestion is noted. There is no evidence of focal opacification, pleural effusion or pneumothorax. The heart is normal in size; the mediastinal contour is within normal limits. No acute osseous abnormalities are seen. IMPRESSION: Mild vascular congestion noted.  Lungs remain grossly clear. Electronically Signed   By: Garald Balding M.D.   On: 01/30/2015 22:53    Medications: I have reviewed the patient's current medications.  Assesment:   Principal Problem:   Asthma exacerbation Active Problems:   History of stroke   Parkinsons disease ([place])   Seizure disorder ([place])   Dyspnea   Diabetes ([place])   Asthma with status asthmaticus    Plan:  Medications reviewed will taper her iv steroid Continue regular medications CBC/BMP    LOS: 1 day   Coletta Lockner 02/01/2015, 7:42 AM

## 2015-02-01 NOTE — Care Management (Signed)
Continue to follow for changing discharge needs, anticipated discharge to home with family

## 2015-02-02 LAB — GLUCOSE, CAPILLARY
GLUCOSE-CAPILLARY: 184 mg/dL — AB (ref 65–99)
Glucose-Capillary: 195 mg/dL — ABNORMAL HIGH (ref 65–99)
Glucose-Capillary: 259 mg/dL — ABNORMAL HIGH (ref 65–99)
Glucose-Capillary: 197 mg/dL — ABNORMAL HIGH (ref 65–99)

## 2015-02-02 LAB — BLOOD GAS, ARTERIAL
ACID-BASE EXCESS: 0.1 mmol/L (ref 0.0–2.0)
BICARBONATE: 24.3 meq/L — AB (ref 20.0–24.0)
DRAWN BY: 277331
FIO2: 0.21
O2 Saturation: 85.4 %
PH ART: 7.404 (ref 7.350–7.450)
PO2 ART: 52 mmHg — AB (ref 80.0–100.0)
Patient temperature: 37
pCO2 arterial: 39.7 mmHg (ref 35.0–45.0)

## 2015-02-02 MED ORDER — PREDNISONE 20 MG PO TABS
40.0000 mg | ORAL_TABLET | Freq: Every day | ORAL | Status: DC
  Administered 2015-02-02 – 2015-02-03 (×2): 40 mg via ORAL
  Filled 2015-02-02 (×2): qty 2

## 2015-02-02 NOTE — Progress Notes (Signed)
Subjective: Feels better but still short of breath. Claims she can't go even to bath room without oxygen. Objective: Vital signs in last 24 hours: Temp:  [97.7 F (36.5 C)-98.4 F (36.9 C)] 98.2 F (36.8 C) (01/27 0538) Pulse Rate:  [58-79] 58 (01/27 0748) Resp:  [14-20] 18 (01/27 0748) BP: (110-131)/(48-76) 123/74 mmHg (01/27 0538) SpO2:  [98 %-100 %] 99 % (01/27 0748) Weight:  [101.107 kg (222 lb 14.4 oz)] 101.107 kg (222 lb 14.4 oz) (01/27 0538) Weight change: -0.993 kg (-2 lb 3 oz) Last BM Date: 01/31/15  Intake/Output from previous day: 01/26 0701 - 01/27 0700 In: 720 [P.O.:720] Out: 250 [Urine:250]  PHYSICAL EXAM General appearance: alert and no distress Resp: diminished breath sounds bilaterally and rhonchi bilaterally Cardio: S1, S2 normal GI: soft, non-tender; bowel sounds normal; no masses,  no organomegaly Extremities: extremities normal, atraumatic, no cyanosis or edema  Lab Results:  Results for orders placed or performed during the hospital encounter of 01/30/15 (from the past 48 hour(s))  Glucose, capillary     Status: Abnormal   Collection Time: 01/31/15 11:35 AM  Result Value Ref Range   Glucose-Capillary 231 (H) 65 - 99 mg/dL   Comment 1 Notify RN    Comment 2 Document in Chart   Glucose, capillary     Status: Abnormal   Collection Time: 01/31/15  4:14 PM  Result Value Ref Range   Glucose-Capillary 186 (H) 65 - 99 mg/dL   Comment 1 Notify RN   Glucose, capillary     Status: Abnormal   Collection Time: 01/31/15  9:09 PM  Result Value Ref Range   Glucose-Capillary 225 (H) 65 - 99 mg/dL   Comment 1 Notify RN    Comment 2 Document in Chart   CBC     Status: Abnormal   Collection Time: 02/01/15  6:16 AM  Result Value Ref Range   WBC 13.5 (H) 4.0 - 10.5 K/uL   RBC 4.23 3.87 - 5.11 MIL/uL   Hemoglobin 12.3 12.0 - 15.0 g/dL   HCT 38.0 36.0 - 46.0 %   MCV 89.8 78.0 - 100.0 fL   MCH 29.1 26.0 - 34.0 pg   MCHC 32.4 30.0 - 36.0 g/dL   RDW 14.3 11.5 -  15.5 %   Platelets 353 150 - 400 K/uL  Basic metabolic panel     Status: Abnormal   Collection Time: 02/01/15  6:16 AM  Result Value Ref Range   Sodium 138 135 - 145 mmol/L   Potassium 4.9 3.5 - 5.1 mmol/L   Chloride 106 101 - 111 mmol/L   CO2 25 22 - 32 mmol/L   Glucose, Bld 274 (H) 65 - 99 mg/dL   BUN 28 (H) 6 - 20 mg/dL   Creatinine, Ser 1.11 (H) 0.44 - 1.00 mg/dL   Calcium 9.1 8.9 - 10.3 mg/dL   GFR calc non Af Amer 44 (L) >60 mL/min   GFR calc Af Amer 51 (L) >60 mL/min    Comment: (NOTE) The eGFR has been calculated using the CKD EPI equation. This calculation has not been validated in all clinical situations. eGFR's persistently <60 mL/min signify possible Chronic Kidney Disease.    Anion gap 7 5 - 15  Glucose, capillary     Status: Abnormal   Collection Time: 02/01/15  7:35 AM  Result Value Ref Range   Glucose-Capillary 229 (H) 65 - 99 mg/dL   Comment 1 Notify RN   Glucose, capillary     Status: Abnormal  Collection Time: 02/01/15 11:38 AM  Result Value Ref Range   Glucose-Capillary 236 (H) 65 - 99 mg/dL   Comment 1 Notify RN   Glucose, capillary     Status: Abnormal   Collection Time: 02/01/15  4:13 PM  Result Value Ref Range   Glucose-Capillary 210 (H) 65 - 99 mg/dL   Comment 1 Notify RN   Glucose, capillary     Status: Abnormal   Collection Time: 02/01/15  9:42 PM  Result Value Ref Range   Glucose-Capillary 253 (H) 65 - 99 mg/dL   Comment 1 Notify RN    Comment 2 Document in Chart   Glucose, capillary     Status: Abnormal   Collection Time: 02/02/15  7:22 AM  Result Value Ref Range   Glucose-Capillary 195 (H) 65 - 99 mg/dL   Comment 1 Notify RN    Comment 2 Document in Chart     ABGS No results for input(s): PHART, PO2ART, TCO2, HCO3 in the last 72 hours.  Invalid input(s): PCO2 CULTURES No results found for this or any previous visit (from the past 240 hour(s)). Studies/Results: No results found.  Medications: I have reviewed the patient's  current medications.  Assesment:   Principal Problem:   Asthma exacerbation Active Problems:   History of stroke   Parkinsons disease ([place])   Seizure disorder ([place])   Dyspnea   Diabetes ([place])   Asthma with status asthmaticus    Plan:  Medications reviewed Change steroid to po ABG on room air Continue regular medications CBC/BMP    LOS: 2 days   Hezekiah Veltre 02/02/2015, 8:12 AM

## 2015-02-02 NOTE — Progress Notes (Signed)
**Note De-Identified  Obfuscation** ABG on RA complete.  SPO2 on RA post ABG 85%; patient placed on 2L [place].

## 2015-02-02 NOTE — Care Management Important Message (Signed)
Important Message  Patient Details  Name: Tiffany Hartman MRN: [id number] Date of Birth: 04-18-1927   Medicare Important Message Given:  Yes    Alvie Heidelberg, RN 02/02/2015, 11:07 AM

## 2015-02-03 LAB — GLUCOSE, CAPILLARY: GLUCOSE-CAPILLARY: 140 mg/dL — AB (ref 65–99)

## 2015-02-03 LAB — BASIC METABOLIC PANEL
Anion gap: 6 (ref 5–15)
BUN: 38 mg/dL — AB (ref 6–20)
CALCIUM: 9 mg/dL (ref 8.9–10.3)
CO2: 29 mmol/L (ref 22–32)
CREATININE: 1.25 mg/dL — AB (ref 0.44–1.00)
Chloride: 104 mmol/L (ref 101–111)
GFR, EST AFRICAN AMERICAN: 44 mL/min — AB (ref >60)
GFR, EST NON AFRICAN AMERICAN: 38 mL/min — AB (ref >60)
Glucose, Bld: 171 mg/dL — ABNORMAL HIGH (ref 65–99)
Potassium: 4.5 mmol/L (ref 3.5–5.1)
SODIUM: 139 mmol/L (ref 135–145)

## 2015-02-03 LAB — CBC
HCT: 37.5 % (ref 36.0–46.0)
Hemoglobin: 12.1 g/dL (ref 12.0–15.0)
MCH: 28.9 pg (ref 26.0–34.0)
MCHC: 32.3 g/dL (ref 30.0–36.0)
MCV: 89.5 fL (ref 78.0–100.0)
PLATELETS: 320 10*3/uL (ref 150–400)
RBC: 4.19 MIL/uL (ref 3.87–5.11)
RDW: 14.4 % (ref 11.5–15.5)
WBC: 11.6 10*3/uL — ABNORMAL HIGH (ref 4.0–10.5)

## 2015-02-03 NOTE — Progress Notes (Addendum)
SATURATION QUALIFICATIONS: (This note is used to comply with regulatory documentation for home oxygen)  Patient Saturations on Room Air at Rest = 99%  Patient Saturations on Room Air while Ambulating = 90-91%  Patient Saturations on 0 Liters of oxygen while Ambulating =   Remained on room air  Please briefly explain why patient needs home oxygen:   The patient did not drop lower that 89%. Patient ambulated approx. 200 feet.  She was some SOB.  Notified Kim on week end case manager who says that she has notified [facility] since [facility] said they did not except her insurance.  Voiced to Maudie Mercury that the Dr. Legrand Rams would like to the patient to go home most likely due to her ABG results from yesterday. Kim verbalizes understanding and states that she would fax over the information to [facility].

## 2015-02-03 NOTE — Discharge Summary (Signed)
Physician Discharge Summary  Patient ID: SARAFINA MIKES MRN: [id number] DOB/AGE: Nov 09, 1927 82 y.o. Primary Care Physician:Alera Quevedo, MD Admit date: 01/30/2015 Discharge date: 02/03/2015    Discharge Diagnoses:   Principal Problem:   Asthma exacerbation Active Problems:   History of stroke   Parkinsons disease ([place])   Seizure disorder ([place])   Dyspnea   Diabetes ([place])   Asthma with status asthmaticus     Medication List    TAKE these medications        albuterol 108 (90 Base) MCG/ACT inhaler  Commonly known as:  PROVENTIL HFA;VENTOLIN HFA  Inhale 1-2 puffs into the lungs every 6 (six) hours as needed for wheezing or shortness of breath.     ALPRAZolam 0.25 MG tablet  Commonly known as:  XANAX  Take 0.25 mg by mouth 3 (three) times daily.     amLODipine 5 MG tablet  Commonly known as:  NORVASC  Take 5 mg by mouth daily.     Calcium Carbonate-Vitamin D 600-200 MG-UNIT Tabs  Take 1 tablet by mouth every evening.     clopidogrel 75 MG tablet  Commonly known as:  PLAVIX  Take 1 tablet (75 mg total) by mouth daily with breakfast.     donepezil 10 MG tablet  Commonly known as:  ARICEPT  Take 10 mg by mouth at bedtime.     ferrous gluconate 325 MG tablet  Commonly known as:  FERGON  Take 325 mg by mouth daily with breakfast.     FLUoxetine 20 MG capsule  Commonly known as:  PROZAC  Take 20 mg by mouth daily.     folic acid 1 MG tablet  Commonly known as:  FOLVITE  Take 1 mg by mouth daily.     furosemide 40 MG tablet  Commonly known as:  LASIX  Take 40 mg by mouth daily.     gabapentin 300 MG capsule  Commonly known as:  NEURONTIN  Take 1 capsule (300 mg total) by mouth 3 (three) times daily.     HYDROcodone-acetaminophen 5-325 MG tablet  Commonly known as:  NORCO/VICODIN  Take 1 tablet by mouth every 6 (six) hours as needed for moderate pain.     ipratropium-albuterol 0.5-2.5 (3) MG/3ML Soln  Commonly known as:  DUONEB  Take 3 mLs by nebulization  every 4 (four) hours.     latanoprost 0.005 % ophthalmic solution  Commonly known as:  XALATAN  Place 1 drop into both eyes at bedtime.     levETIRAcetam 250 MG tablet  Commonly known as:  KEPPRA  Take 250 mg by mouth 2 (two) times daily.     losartan 100 MG tablet  Commonly known as:  COZAAR  Take 100 mg by mouth daily.     lovastatin 10 MG tablet  Commonly known as:  MEVACOR  Take 10 mg by mouth at bedtime.     omeprazole 20 MG capsule  Commonly known as:  PRILOSEC  Take 20 mg by mouth 2 (two) times daily.     potassium chloride SA 20 MEQ tablet  Commonly known as:  K-DUR,KLOR-CON  Take 20 mEq by mouth 2 (two) times daily.     predniSONE 10 MG tablet  Commonly known as:  DELTASONE  Take 4 tablets (40 mg total) by mouth daily.        Discharged Condition: improved    Consults: none  Significant Diagnostic Studies: Dg Chest 2 View  01/30/2015  CLINICAL DATA:  Acute onset of shortness of  breath and congestion. Initial encounter. EXAM: CHEST  2 VIEW COMPARISON:  Chest radiograph and CTA of the chest performed 08/17/2014 FINDINGS: The lungs are well-aerated. Mild vascular congestion is noted. There is no evidence of focal opacification, pleural effusion or pneumothorax. The heart is normal in size; the mediastinal contour is within normal limits. No acute osseous abnormalities are seen. IMPRESSION: Mild vascular congestion noted.  Lungs remain grossly clear. Electronically Signed   By: Garald Balding M.D.   On: 01/30/2015 22:53   Mr Cervical Spine Wo Contrast  01/24/2015  CLINICAL DATA:  Right neck pain for 3 months. History of breast cancer. Kyphosis. EXAM: MRI CERVICAL SPINE WITHOUT CONTRAST TECHNIQUE: Multiplanar, multisequence MR imaging of the cervical spine was performed. No intravenous contrast was administered. COMPARISON:  01/04/2015 FINDINGS: Considerable increase pontine signal as on prior MRI brain from 08/11/2012, likely from chronic ischemic microvascular white  matter disease. Despite efforts by the technologist and patient, motion artifact is present on today's exam and could not be eliminated. This reduces exam sensitivity and specificity. Loss of the normal cervical lordosis, which can be associated with muscle spasm. For No vertebral subluxation is observed. Intervertebral disc desiccation is observed at all levels in the cervical spine with loss of disc height most notable at C4-5 and C5-6. Type 1 degenerative endplate findings observed at C5-6. Additional findings at individual levels are as follows: C2-3:  No impingement.  Mild uncinate spurring. C3-4: Moderate left foraminal stenosis and borderline central narrowing of the thecal sac due to left greater than right uncinate spurring, facet arthropathy, and mild disc bulge. C4-5: Moderate bilateral foraminal stenosis and mild central narrowing of the thecal sac due to disc bulge, uncinate spurring, and facet arthropathy. C5-6: Moderate left eccentric central narrowing of the thecal sac and mild left foraminal stenosis due to intervertebral spurring, disc bulge, uncinate spurring, and facet arthropathy. C6-7: Borderline central narrowing of the thecal sac due to disc bulge. C7-T1:  No impingement.  Mild bilateral facet arthropathy. T1-2: Mild left foraminal stenosis and borderline central narrowing of the thecal sac due to central disc protrusion extending cephalad, and left facet arthropathy. IMPRESSION: 1. Cervical spondylosis and degenerative disc disease, causing moderate impingement at C3-4, C4-5, and C5-6; and mild impingement at T1- 2, as detailed above. 2. Stably advanced chronic ischemic microvascular white matter disease involving the pons. Electronically Signed   By: Van Clines M.D.   On: 01/24/2015 11:45    Lab Results: Basic Metabolic Panel:  Recent Labs  02/01/15 0616 02/03/15 0631  NA 138 139  K 4.9 4.5  CL 106 104  CO2 25 29  GLUCOSE 274* 171*  BUN 28* 38*  CREATININE 1.11*  1.25*  CALCIUM 9.1 9.0   Liver Function Tests: No results for input(s): AST, ALT, ALKPHOS, BILITOT, PROT, ALBUMIN in the last 72 hours.   CBC:  Recent Labs  02/01/15 0616 02/03/15 0631  WBC 13.5* 11.6*  HGB 12.3 12.1  HCT 38.0 37.5  MCV 89.8 89.5  PLT 353 320    No results found for this or any previous visit (from the past 240 hour(s)).   Hospital Course:   This is an 82 years old female who was admitted due to acute exacerbation of asthma. She was treated with iv steroid and nebulizer treatment. Patient improved. Her ABG on room air showed a PO2 of 52. Patient will be discharge with home oxygen.  Discharge Exam: Blood pressure 136/59, pulse 52, temperature 98.1 F (36.7 C), temperature source Oral,  resp. rate 20, height 5' (1.524 m), weight 100.653 kg (221 lb 14.4 oz), SpO2 92 %.   Disposition:  home        Follow-up Information    Follow up with [facility].   Specialty:  Emergency Medicine   Why:  If symptoms worsen   Contact information:   [address] [id number] [place] [id number] [phone]      Follow up with Rosita Fire, MD.   Specialty:  Internal Medicine   Why:  follow-up in 1-2 days   Contact information:   [place] [id number] [phone]       Signed: Rosita Fire   02/03/2015, 10:39 AM

## 2015-02-03 NOTE — Progress Notes (Signed)
CM contacted [facility] regarding oxygen for patient.  Need to submit information for review.  Communicated to representative about her diagnosis and O2 levels.  Received call from representative at [facility] after reviewing information for oxygen.  Spoke with Seth Bake phone#  [phone].  Informed that nurse will contact regarding oxygen order.  CM contacted nurse on unit regarding diagnosis and patient meeting criteria for Medicare guidelines for payment for oxygen.

## 2015-02-04 DIAGNOSIS — E1165 Type 2 diabetes mellitus with hyperglycemia: Secondary | ICD-10-CM | POA: Diagnosis not present

## 2015-02-04 DIAGNOSIS — J45901 Unspecified asthma with (acute) exacerbation: Secondary | ICD-10-CM | POA: Diagnosis not present

## 2015-02-04 DIAGNOSIS — I10 Essential (primary) hypertension: Secondary | ICD-10-CM | POA: Diagnosis not present

## 2015-02-05 NOTE — Progress Notes (Signed)
After discussing with the [facility] nurse, I discussed with the patient that she did not meet qualifications for her insurance to cover the oxygen a home.  Voiced to her that the the nurse stated that they would file a claim with the insurance but with her dx of asthma and O2 sats not being below 89% she most likely would have to pay an estimate of 200-250.00 per month for the O2 and supplies.  She and her daughter verballized understanding and stated that she still wanted to have the O2 ordered.  So I voiced that to [facility] nurse.  SHe voices that she would be calling the patient for set up.  The patient stated that she did not want to stay and wait for a portable tank to be brought here to the hospital but wanted it to be sent to her house.  I passed this as well to the nurse.  Patient discharged with instructions, prescription, and care notes.  Verbalized understanding via teach back.  IV was removed and the site was WNL. Patient voiced no further complaints or concerns at the time of discharge.  Appointments scheduled per instructions.  Patient left the floor via w/c with staff and family in stable condition.

## 2015-02-06 DIAGNOSIS — J45901 Unspecified asthma with (acute) exacerbation: Secondary | ICD-10-CM | POA: Diagnosis not present

## 2015-02-06 DIAGNOSIS — G20 Parkinson's disease: Secondary | ICD-10-CM | POA: Diagnosis not present

## 2015-02-07 DIAGNOSIS — R0902 Hypoxemia: Secondary | ICD-10-CM | POA: Diagnosis not present

## 2015-02-07 DIAGNOSIS — I10 Essential (primary) hypertension: Secondary | ICD-10-CM | POA: Diagnosis not present

## 2015-02-07 DIAGNOSIS — E1165 Type 2 diabetes mellitus with hyperglycemia: Secondary | ICD-10-CM | POA: Diagnosis not present

## 2015-02-07 DIAGNOSIS — J449 Chronic obstructive pulmonary disease, unspecified: Secondary | ICD-10-CM | POA: Diagnosis not present

## 2015-02-08 DIAGNOSIS — G20 Parkinson's disease: Secondary | ICD-10-CM | POA: Diagnosis not present

## 2015-02-08 DIAGNOSIS — J45901 Unspecified asthma with (acute) exacerbation: Secondary | ICD-10-CM | POA: Diagnosis not present

## 2015-02-09 DIAGNOSIS — G20 Parkinson's disease: Secondary | ICD-10-CM | POA: Diagnosis not present

## 2015-02-09 DIAGNOSIS — J45901 Unspecified asthma with (acute) exacerbation: Secondary | ICD-10-CM | POA: Diagnosis not present

## 2015-02-13 DIAGNOSIS — G20 Parkinson's disease: Secondary | ICD-10-CM | POA: Diagnosis not present

## 2015-02-13 DIAGNOSIS — J45901 Unspecified asthma with (acute) exacerbation: Secondary | ICD-10-CM | POA: Diagnosis not present

## 2015-02-15 DIAGNOSIS — G20 Parkinson's disease: Secondary | ICD-10-CM | POA: Diagnosis not present

## 2015-02-15 DIAGNOSIS — J45901 Unspecified asthma with (acute) exacerbation: Secondary | ICD-10-CM | POA: Diagnosis not present

## 2015-02-16 DIAGNOSIS — G20 Parkinson's disease: Secondary | ICD-10-CM | POA: Diagnosis not present

## 2015-02-16 DIAGNOSIS — J45901 Unspecified asthma with (acute) exacerbation: Secondary | ICD-10-CM | POA: Diagnosis not present

## 2015-02-20 DIAGNOSIS — G20 Parkinson's disease: Secondary | ICD-10-CM | POA: Diagnosis not present

## 2015-02-20 DIAGNOSIS — J45901 Unspecified asthma with (acute) exacerbation: Secondary | ICD-10-CM | POA: Diagnosis not present

## 2015-02-22 DIAGNOSIS — J45901 Unspecified asthma with (acute) exacerbation: Secondary | ICD-10-CM | POA: Diagnosis not present

## 2015-02-22 DIAGNOSIS — G20 Parkinson's disease: Secondary | ICD-10-CM | POA: Diagnosis not present

## 2015-02-23 DIAGNOSIS — J449 Chronic obstructive pulmonary disease, unspecified: Secondary | ICD-10-CM | POA: Diagnosis not present

## 2015-02-27 DIAGNOSIS — G20 Parkinson's disease: Secondary | ICD-10-CM | POA: Diagnosis not present

## 2015-02-27 DIAGNOSIS — J45901 Unspecified asthma with (acute) exacerbation: Secondary | ICD-10-CM | POA: Diagnosis not present

## 2015-03-09 DIAGNOSIS — G20 Parkinson's disease: Secondary | ICD-10-CM | POA: Diagnosis not present

## 2015-03-09 DIAGNOSIS — J45901 Unspecified asthma with (acute) exacerbation: Secondary | ICD-10-CM | POA: Diagnosis not present

## 2015-04-12 DIAGNOSIS — H16233 Neurotrophic keratoconjunctivitis, bilateral: Secondary | ICD-10-CM | POA: Diagnosis not present

## 2015-04-12 DIAGNOSIS — H47233 Glaucomatous optic atrophy, bilateral: Secondary | ICD-10-CM | POA: Diagnosis not present

## 2015-04-12 DIAGNOSIS — H401133 Primary open-angle glaucoma, bilateral, severe stage: Secondary | ICD-10-CM | POA: Diagnosis not present

## 2015-05-09 DIAGNOSIS — F329 Major depressive disorder, single episode, unspecified: Secondary | ICD-10-CM | POA: Diagnosis not present

## 2015-05-09 DIAGNOSIS — E1165 Type 2 diabetes mellitus with hyperglycemia: Secondary | ICD-10-CM | POA: Diagnosis not present

## 2015-05-09 DIAGNOSIS — I10 Essential (primary) hypertension: Secondary | ICD-10-CM | POA: Diagnosis not present

## 2015-05-09 DIAGNOSIS — J449 Chronic obstructive pulmonary disease, unspecified: Secondary | ICD-10-CM | POA: Diagnosis not present

## 2015-05-09 DIAGNOSIS — M159 Polyosteoarthritis, unspecified: Secondary | ICD-10-CM | POA: Diagnosis not present

## 2015-08-07 DIAGNOSIS — H47233 Glaucomatous optic atrophy, bilateral: Secondary | ICD-10-CM | POA: Diagnosis not present

## 2015-08-07 DIAGNOSIS — H401133 Primary open-angle glaucoma, bilateral, severe stage: Secondary | ICD-10-CM | POA: Diagnosis not present

## 2015-08-07 DIAGNOSIS — H16223 Keratoconjunctivitis sicca, not specified as Sjogren's, bilateral: Secondary | ICD-10-CM | POA: Diagnosis not present

## 2015-08-23 DIAGNOSIS — H47233 Glaucomatous optic atrophy, bilateral: Secondary | ICD-10-CM | POA: Diagnosis not present

## 2015-08-23 DIAGNOSIS — H401133 Primary open-angle glaucoma, bilateral, severe stage: Secondary | ICD-10-CM | POA: Diagnosis not present

## 2015-08-23 DIAGNOSIS — H16223 Keratoconjunctivitis sicca, not specified as Sjogren's, bilateral: Secondary | ICD-10-CM | POA: Diagnosis not present

## 2015-09-11 DIAGNOSIS — Z79899 Other long term (current) drug therapy: Secondary | ICD-10-CM | POA: Diagnosis not present

## 2015-09-11 DIAGNOSIS — Z961 Presence of intraocular lens: Secondary | ICD-10-CM | POA: Diagnosis not present

## 2015-09-11 DIAGNOSIS — H401132 Primary open-angle glaucoma, bilateral, moderate stage: Secondary | ICD-10-CM | POA: Diagnosis not present

## 2015-09-11 DIAGNOSIS — E119 Type 2 diabetes mellitus without complications: Secondary | ICD-10-CM | POA: Diagnosis not present

## 2015-09-11 DIAGNOSIS — Z8673 Personal history of transient ischemic attack (TIA), and cerebral infarction without residual deficits: Secondary | ICD-10-CM | POA: Diagnosis not present

## 2015-09-11 DIAGNOSIS — I10 Essential (primary) hypertension: Secondary | ICD-10-CM | POA: Diagnosis not present

## 2015-09-11 DIAGNOSIS — H30033 Focal chorioretinal inflammation, peripheral, bilateral: Secondary | ICD-10-CM | POA: Diagnosis not present

## 2015-09-11 DIAGNOSIS — H35039 Hypertensive retinopathy, unspecified eye: Secondary | ICD-10-CM | POA: Insufficient documentation

## 2015-09-11 DIAGNOSIS — Z9849 Cataract extraction status, unspecified eye: Secondary | ICD-10-CM | POA: Diagnosis not present

## 2015-09-11 DIAGNOSIS — H35033 Hypertensive retinopathy, bilateral: Secondary | ICD-10-CM | POA: Diagnosis not present

## 2015-09-11 DIAGNOSIS — Z7902 Long term (current) use of antithrombotics/antiplatelets: Secondary | ICD-10-CM | POA: Diagnosis not present

## 2015-09-12 DIAGNOSIS — H30033 Focal chorioretinal inflammation, peripheral, bilateral: Secondary | ICD-10-CM | POA: Diagnosis not present

## 2015-10-02 DIAGNOSIS — Z961 Presence of intraocular lens: Secondary | ICD-10-CM | POA: Insufficient documentation

## 2015-10-02 DIAGNOSIS — H35033 Hypertensive retinopathy, bilateral: Secondary | ICD-10-CM | POA: Diagnosis not present

## 2015-10-02 DIAGNOSIS — H401132 Primary open-angle glaucoma, bilateral, moderate stage: Secondary | ICD-10-CM | POA: Diagnosis not present

## 2015-10-02 DIAGNOSIS — H30033 Focal chorioretinal inflammation, peripheral, bilateral: Secondary | ICD-10-CM | POA: Diagnosis not present

## 2015-10-04 DIAGNOSIS — G459 Transient cerebral ischemic attack, unspecified: Secondary | ICD-10-CM | POA: Diagnosis not present

## 2015-10-04 DIAGNOSIS — F3340 Major depressive disorder, recurrent, in remission, unspecified: Secondary | ICD-10-CM | POA: Diagnosis not present

## 2015-10-04 DIAGNOSIS — F329 Major depressive disorder, single episode, unspecified: Secondary | ICD-10-CM | POA: Diagnosis not present

## 2015-10-04 DIAGNOSIS — Z23 Encounter for immunization: Secondary | ICD-10-CM | POA: Diagnosis not present

## 2015-10-04 DIAGNOSIS — E041 Nontoxic single thyroid nodule: Secondary | ICD-10-CM | POA: Diagnosis not present

## 2015-10-04 DIAGNOSIS — E1165 Type 2 diabetes mellitus with hyperglycemia: Secondary | ICD-10-CM | POA: Diagnosis not present

## 2015-10-04 DIAGNOSIS — J9690 Respiratory failure, unspecified, unspecified whether with hypoxia or hypercapnia: Secondary | ICD-10-CM | POA: Diagnosis not present

## 2015-10-04 DIAGNOSIS — F0390 Unspecified dementia without behavioral disturbance: Secondary | ICD-10-CM | POA: Diagnosis not present

## 2015-10-04 DIAGNOSIS — E784 Other hyperlipidemia: Secondary | ICD-10-CM | POA: Diagnosis not present

## 2015-10-04 DIAGNOSIS — I10 Essential (primary) hypertension: Secondary | ICD-10-CM | POA: Diagnosis not present

## 2015-10-04 DIAGNOSIS — J449 Chronic obstructive pulmonary disease, unspecified: Secondary | ICD-10-CM | POA: Diagnosis not present

## 2015-10-12 DIAGNOSIS — R531 Weakness: Secondary | ICD-10-CM | POA: Diagnosis not present

## 2015-10-12 DIAGNOSIS — M150 Primary generalized (osteo)arthritis: Secondary | ICD-10-CM | POA: Diagnosis not present

## 2015-10-16 DIAGNOSIS — R300 Dysuria: Secondary | ICD-10-CM | POA: Diagnosis not present

## 2015-10-16 DIAGNOSIS — M150 Primary generalized (osteo)arthritis: Secondary | ICD-10-CM | POA: Diagnosis not present

## 2015-10-16 DIAGNOSIS — R531 Weakness: Secondary | ICD-10-CM | POA: Diagnosis not present

## 2015-10-16 DIAGNOSIS — E1165 Type 2 diabetes mellitus with hyperglycemia: Secondary | ICD-10-CM | POA: Diagnosis not present

## 2015-10-18 DIAGNOSIS — H16223 Keratoconjunctivitis sicca, not specified as Sjogren's, bilateral: Secondary | ICD-10-CM | POA: Diagnosis not present

## 2015-10-18 DIAGNOSIS — H401133 Primary open-angle glaucoma, bilateral, severe stage: Secondary | ICD-10-CM | POA: Diagnosis not present

## 2015-10-18 DIAGNOSIS — H2000 Unspecified acute and subacute iridocyclitis: Secondary | ICD-10-CM | POA: Diagnosis not present

## 2015-10-19 DIAGNOSIS — M150 Primary generalized (osteo)arthritis: Secondary | ICD-10-CM | POA: Diagnosis not present

## 2015-10-19 DIAGNOSIS — R531 Weakness: Secondary | ICD-10-CM | POA: Diagnosis not present

## 2015-10-23 DIAGNOSIS — M150 Primary generalized (osteo)arthritis: Secondary | ICD-10-CM | POA: Diagnosis not present

## 2015-10-23 DIAGNOSIS — R531 Weakness: Secondary | ICD-10-CM | POA: Diagnosis not present

## 2015-10-25 DIAGNOSIS — R531 Weakness: Secondary | ICD-10-CM | POA: Diagnosis not present

## 2015-10-25 DIAGNOSIS — M150 Primary generalized (osteo)arthritis: Secondary | ICD-10-CM | POA: Diagnosis not present

## 2015-10-29 DIAGNOSIS — M150 Primary generalized (osteo)arthritis: Secondary | ICD-10-CM | POA: Diagnosis not present

## 2015-10-29 DIAGNOSIS — R531 Weakness: Secondary | ICD-10-CM | POA: Diagnosis not present

## 2015-11-01 DIAGNOSIS — M150 Primary generalized (osteo)arthritis: Secondary | ICD-10-CM | POA: Diagnosis not present

## 2015-11-01 DIAGNOSIS — R531 Weakness: Secondary | ICD-10-CM | POA: Diagnosis not present

## 2015-11-06 DIAGNOSIS — M150 Primary generalized (osteo)arthritis: Secondary | ICD-10-CM | POA: Diagnosis not present

## 2015-11-06 DIAGNOSIS — R531 Weakness: Secondary | ICD-10-CM | POA: Diagnosis not present

## 2015-11-09 DIAGNOSIS — R531 Weakness: Secondary | ICD-10-CM | POA: Diagnosis not present

## 2015-11-09 DIAGNOSIS — M150 Primary generalized (osteo)arthritis: Secondary | ICD-10-CM | POA: Diagnosis not present

## 2015-11-20 DIAGNOSIS — H16223 Keratoconjunctivitis sicca, not specified as Sjogren's, bilateral: Secondary | ICD-10-CM | POA: Diagnosis not present

## 2015-11-20 DIAGNOSIS — H401133 Primary open-angle glaucoma, bilateral, severe stage: Secondary | ICD-10-CM | POA: Diagnosis not present

## 2015-11-20 DIAGNOSIS — H2000 Unspecified acute and subacute iridocyclitis: Secondary | ICD-10-CM | POA: Diagnosis not present

## 2015-12-21 DIAGNOSIS — E784 Other hyperlipidemia: Secondary | ICD-10-CM | POA: Diagnosis not present

## 2015-12-21 DIAGNOSIS — J449 Chronic obstructive pulmonary disease, unspecified: Secondary | ICD-10-CM | POA: Diagnosis not present

## 2015-12-21 DIAGNOSIS — I10 Essential (primary) hypertension: Secondary | ICD-10-CM | POA: Diagnosis not present

## 2015-12-21 DIAGNOSIS — E1165 Type 2 diabetes mellitus with hyperglycemia: Secondary | ICD-10-CM | POA: Diagnosis not present

## 2015-12-28 ENCOUNTER — Ambulatory Visit (INDEPENDENT_AMBULATORY_CARE_PROVIDER_SITE_OTHER): Payer: Medicare Other

## 2015-12-28 ENCOUNTER — Ambulatory Visit (INDEPENDENT_AMBULATORY_CARE_PROVIDER_SITE_OTHER): Payer: Medicare Other | Admitting: Orthopedic Surgery

## 2015-12-28 ENCOUNTER — Encounter: Payer: Self-pay | Admitting: Orthopedic Surgery

## 2015-12-28 VITALS — BP 124/70 | HR 83 | Ht 62.0 in | Wt 221.0 lb

## 2015-12-28 DIAGNOSIS — M47812 Spondylosis without myelopathy or radiculopathy, cervical region: Secondary | ICD-10-CM

## 2015-12-28 DIAGNOSIS — M25511 Pain in right shoulder: Secondary | ICD-10-CM | POA: Diagnosis not present

## 2015-12-28 DIAGNOSIS — M19019 Primary osteoarthritis, unspecified shoulder: Secondary | ICD-10-CM

## 2015-12-28 NOTE — Progress Notes (Signed)
Patient ID: Tiffany Hartman, female   DOB: 1927-11-14, 82 y.o.   MRN: [id number]  Chief Complaint  Patient presents with  . Shoulder Pain    Right shoulder pain    HPI Tiffany Hartman is a 82 y.o. female.  82 year old female with chronic right shoulder pain presents for reevaluation of ongoing pain in the right shoulder.  Location of pain right shoulder and arm. Quality dull aching Severity 8-10 Duration several months  Timing constant Associated symptoms include numbness and pain radiating down the right arm   Review of Systems Review of Systems  Constitutional: Negative for fever.  Neurological: Positive for weakness and numbness.    Past Medical History:  Diagnosis Date  . Asthma   . Breast cancer ([place]) 02/20/83   LT mastectomy  . Bronchitis   . Dementia   . Diabetes mellitus ([place])   . Diverticulitis 2009  . Glaucoma   . HTN (hypertension)   . OA (osteoarthritis)   . Obesity 08/17/2014  . Parkinson disease ([place])   . Stroke ([place])   . Thyroid nodule    Dr Legrand Rams    Past Surgical History:  Procedure Laterality Date  .  bilateral catracts    . ABDOMINAL HYSTERECTOMY     complete  . APPENDECTOMY    . EUS  09/10/2011   Procedure: UPPER ENDOSCOPIC ULTRASOUND (EUS) RADIAL;  Surgeon: Arta Silence, MD;  Location: [facility];  Service: Endoscopy;  Laterality: N/A;  christina/ebp  . FINE NEEDLE ASPIRATION  09/10/2011   Procedure: FINE NEEDLE ASPIRATION (FNA) LINEAR;  Surgeon: Arta Silence, MD;  Location: [facility];  Service: Endoscopy;  Laterality: N/A;  . FRACTURE SURGERY  2008    right and left femurs  . JOINT REPLACEMENT  1998/1999   knee boths  . KNEE SURGERY Right    TKA  . KNEE SURGERY Left    TKA  . KNEE SURGERY Right    Periprosthetic fracture /otif  . MASTECTOMY  02/20/1983   left     Social History Social History  Substance Use Topics  . Smoking status: Never Smoker  . Smokeless tobacco: Never Used  . Alcohol use No    No Known Allergies  Current  Meds  Medication Sig  . albuterol (PROVENTIL HFA;VENTOLIN HFA) 108 (90 Base) MCG/ACT inhaler Inhale 1-2 puffs into the lungs every 6 (six) hours as needed for wheezing or shortness of breath.  . ALPRAZolam (XANAX) 0.25 MG tablet Take 0.25 mg by mouth 3 (three) times daily.   Marland Kitchen amLODipine (NORVASC) 5 MG tablet Take 5 mg by mouth daily.  . Calcium Carbonate-Vitamin D (CALCIUM + D) 600-200 MG-UNIT TABS Take 1 tablet by mouth every evening.   . clopidogrel (PLAVIX) 75 MG tablet Take 1 tablet (75 mg total) by mouth daily with breakfast.  . donepezil (ARICEPT) 10 MG tablet Take 10 mg by mouth at bedtime.  . ferrous gluconate (FERGON) 325 MG tablet Take 325 mg by mouth daily with breakfast.  . FLUoxetine (PROZAC) 20 MG capsule Take 20 mg by mouth daily.  . folic acid (FOLVITE) 1 MG tablet Take 1 mg by mouth daily.  . furosemide (LASIX) 40 MG tablet Take 40 mg by mouth daily.   Marland Kitchen gabapentin (NEURONTIN) 300 MG capsule Take 1 capsule (300 mg total) by mouth 3 (three) times daily.  Marland Kitchen HYDROcodone-acetaminophen (NORCO/VICODIN) 5-325 MG tablet Take 1 tablet by mouth every 6 (six) hours as needed for moderate pain.  Marland Kitchen ipratropium-albuterol (DUONEB) 0.5-2.5 (3) MG/3ML SOLN  Take 3 mLs by nebulization every 4 (four) hours. (Patient taking differently: Take 3 mLs by nebulization 4 (four) times daily. )  . latanoprost (XALATAN) 0.005 % ophthalmic solution Place 1 drop into both eyes at bedtime.  . levETIRAcetam (KEPPRA) 250 MG tablet Take 250 mg by mouth 2 (two) times daily.  Marland Kitchen losartan (COZAAR) 100 MG tablet Take 100 mg by mouth daily.  Marland Kitchen lovastatin (MEVACOR) 10 MG tablet Take 10 mg by mouth at bedtime.  Marland Kitchen omeprazole (PRILOSEC) 20 MG capsule Take 20 mg by mouth 2 (two) times daily.   . potassium chloride SA (K-DUR,KLOR-CON) 20 MEQ tablet Take 20 mEq by mouth 2 (two) times daily.      Physical Exam Physical Exam BP 124/70   Pulse 83   Ht 5\' 2"  (1.575 m)   Wt 221 lb (100.2 kg)   BMI 40.42 kg/m   Gen.  appearance. The patient is well-developed and well-nourished, grooming and hygiene are normal. There are no gross congenital abnormalities  The patient is alert and oriented to person place and time  Mood and affect are normal  Ambulation walker   Examination reveals the following: On inspection we find tenderness in the base of the cervical spine right trapezius muscle periarticular right shoulder  With the range of motion of  passive external rotation is 40 passive flexion is 110 with pain  Stability tests were normal  in abduction external rotation  Strength tests revealed grade 5 motor strength internal and external rotation with questionable strength supraspinatus secondary to pain inadequate exam  Skin we find no rash ulceration or erythema  Sensation remains intact left side abnormal right side especially in the hand decreased sensation in C6 distribution   Impression vascular system shows no peripheral edema   Cervical range of motion showed decreased lateral rotation to the left normal to the right normal flexion normal extension negative Spurling sign  Reflexes were normal in both upper extremities Data Reviewed Plain films today of this   shoulder show glenohumeral arthritis grade 4 no proximal migration of the humerus  Prior cervical spine films show cervical spondylosis and spondylolisthesis loss of cervical curvature   Assessment Although surgery may help the situation the patient is not in medical condition to have surgery    Right shoulder arthritis Right shoulder pain Cervical spondylosis     Plan    She is not a surgical candidate so we injected the subacromial space     Okay to repeat injection 3 months needed   Procedure note the subacromial injection shoulder RIGHT  Verbal consent was obtained to inject the  RIGHT   Shoulder  Timeout was completed to confirm the injection site is a subacromial space of the  RIGHT  shoulder   Medication  used Depo-Medrol 40 mg and lidocaine 1% 3 cc  Anesthesia was provided by ethyl chloride  The injection was performed in the RIGHT  posterior subacromial space. After pinning the skin with alcohol and anesthetized the skin with ethyl chloride the subacromial space was injected using a 20-gauge needle. There were no complications  Sterile dressing was applied.    Arther Abbott 12/28/2015, 8:49 AM

## 2015-12-28 NOTE — Patient Instructions (Signed)

## 2016-01-15 DIAGNOSIS — H30033 Focal chorioretinal inflammation, peripheral, bilateral: Secondary | ICD-10-CM | POA: Diagnosis not present

## 2016-01-15 DIAGNOSIS — H401132 Primary open-angle glaucoma, bilateral, moderate stage: Secondary | ICD-10-CM | POA: Diagnosis not present

## 2016-01-15 DIAGNOSIS — Z961 Presence of intraocular lens: Secondary | ICD-10-CM | POA: Diagnosis not present

## 2016-01-15 DIAGNOSIS — H35033 Hypertensive retinopathy, bilateral: Secondary | ICD-10-CM | POA: Diagnosis not present

## 2016-01-29 DIAGNOSIS — H2000 Unspecified acute and subacute iridocyclitis: Secondary | ICD-10-CM | POA: Diagnosis not present

## 2016-01-29 DIAGNOSIS — H16223 Keratoconjunctivitis sicca, not specified as Sjogren's, bilateral: Secondary | ICD-10-CM | POA: Diagnosis not present

## 2016-01-29 DIAGNOSIS — H401133 Primary open-angle glaucoma, bilateral, severe stage: Secondary | ICD-10-CM | POA: Diagnosis not present

## 2016-02-27 DIAGNOSIS — H18003 Unspecified corneal deposit, bilateral: Secondary | ICD-10-CM | POA: Diagnosis not present

## 2016-02-27 DIAGNOSIS — H18211 Corneal edema secondary to contact lens, right eye: Secondary | ICD-10-CM | POA: Diagnosis not present

## 2016-02-27 DIAGNOSIS — Z961 Presence of intraocular lens: Secondary | ICD-10-CM | POA: Diagnosis not present

## 2016-02-27 DIAGNOSIS — H18009 Unspecified corneal deposit, unspecified eye: Secondary | ICD-10-CM | POA: Insufficient documentation

## 2016-03-25 ENCOUNTER — Ambulatory Visit (HOSPITAL_COMMUNITY)
Admission: RE | Admit: 2016-03-25 | Discharge: 2016-03-25 | Disposition: A | Discharge status: Home / Self Care | Payer: Medicare Other | Source: Ambulatory Visit | Attending: Internal Medicine | Admitting: Internal Medicine

## 2016-03-25 ENCOUNTER — Other Ambulatory Visit (HOSPITAL_COMMUNITY): Payer: Self-pay | Admitting: Respiratory Therapy

## 2016-03-25 ENCOUNTER — Other Ambulatory Visit (HOSPITAL_COMMUNITY)
Admission: RE | Admit: 2016-03-25 | Discharge: 2016-03-25 | Disposition: A | Discharge status: Home / Self Care | Payer: Medicare Other | Source: Ambulatory Visit | Attending: Internal Medicine | Admitting: Internal Medicine

## 2016-03-25 ENCOUNTER — Other Ambulatory Visit (HOSPITAL_COMMUNITY): Payer: Self-pay | Admitting: Internal Medicine

## 2016-03-25 ENCOUNTER — Inpatient Hospital Stay (HOSPITAL_COMMUNITY): Admission: RE | Admit: 2016-03-25 | Payer: Medicare Other | Source: Ambulatory Visit

## 2016-03-25 DIAGNOSIS — I10 Essential (primary) hypertension: Secondary | ICD-10-CM | POA: Diagnosis not present

## 2016-03-25 DIAGNOSIS — R0602 Shortness of breath: Secondary | ICD-10-CM

## 2016-03-25 DIAGNOSIS — M25562 Pain in left knee: Secondary | ICD-10-CM | POA: Diagnosis not present

## 2016-03-25 DIAGNOSIS — E0520 Thyrotoxicosis with toxic multinodular goiter without thyrotoxic crisis or storm: Secondary | ICD-10-CM | POA: Diagnosis not present

## 2016-03-25 DIAGNOSIS — E049 Nontoxic goiter, unspecified: Secondary | ICD-10-CM | POA: Insufficient documentation

## 2016-03-25 DIAGNOSIS — M25561 Pain in right knee: Secondary | ICD-10-CM | POA: Insufficient documentation

## 2016-03-25 DIAGNOSIS — E119 Type 2 diabetes mellitus without complications: Secondary | ICD-10-CM | POA: Insufficient documentation

## 2016-03-25 DIAGNOSIS — E1165 Type 2 diabetes mellitus with hyperglycemia: Secondary | ICD-10-CM | POA: Diagnosis not present

## 2016-03-25 DIAGNOSIS — R0902 Hypoxemia: Secondary | ICD-10-CM | POA: Diagnosis not present

## 2016-03-25 DIAGNOSIS — M1990 Unspecified osteoarthritis, unspecified site: Secondary | ICD-10-CM | POA: Diagnosis not present

## 2016-03-25 LAB — CBC WITH DIFFERENTIAL/PLATELET
Basophils Absolute: 0.1 10*3/uL (ref 0.0–0.1)
Basophils Relative: 1 %
EOS ABS: 0.3 10*3/uL (ref 0.0–0.7)
Eosinophils Relative: 4 %
HCT: 36.3 % (ref 36.0–46.0)
HEMOGLOBIN: 11.6 g/dL — AB (ref 12.0–15.0)
LYMPHS ABS: 1.9 10*3/uL (ref 0.7–4.0)
LYMPHS PCT: 31 %
MCH: 29.3 pg (ref 26.0–34.0)
MCHC: 32 g/dL (ref 30.0–36.0)
MCV: 91.7 fL (ref 78.0–100.0)
MONOS PCT: 9 %
Monocytes Absolute: 0.5 10*3/uL (ref 0.1–1.0)
NEUTROS PCT: 55 %
Neutro Abs: 3.4 10*3/uL (ref 1.7–7.7)
Platelets: 333 10*3/uL (ref 150–400)
RBC: 3.96 MIL/uL (ref 3.87–5.11)
RDW: 14.2 % (ref 11.5–15.5)
WBC: 6.1 10*3/uL (ref 4.0–10.5)

## 2016-03-25 LAB — BLOOD GAS, ARTERIAL
Acid-Base Excess: 0.9 mmol/L (ref 0.0–2.0)
Bicarbonate: 24.8 mmol/L (ref 20.0–28.0)
Drawn by: 23534
O2 Content: 2 L/min
O2 SAT: 93.9 %
PCO2 ART: 45.3 mmHg (ref 32.0–48.0)
PH ART: 7.37 (ref 7.350–7.450)
PO2 ART: 73.5 mmHg — AB (ref 83.0–108.0)

## 2016-03-25 LAB — BASIC METABOLIC PANEL
Anion gap: 6 (ref 5–15)
BUN: 29 mg/dL — AB (ref 6–20)
CO2: 28 mmol/L (ref 22–32)
CREATININE: 1.3 mg/dL — AB (ref 0.44–1.00)
Calcium: 9.1 mg/dL (ref 8.9–10.3)
Chloride: 106 mmol/L (ref 101–111)
GFR calc non Af Amer: 36 mL/min — ABNORMAL LOW (ref >60)
GFR, EST AFRICAN AMERICAN: 42 mL/min — AB (ref >60)
Glucose, Bld: 122 mg/dL — ABNORMAL HIGH (ref 65–99)
POTASSIUM: 4.3 mmol/L (ref 3.5–5.1)
Sodium: 140 mmol/L (ref 135–145)

## 2016-03-26 ENCOUNTER — Ambulatory Visit (HOSPITAL_COMMUNITY): Payer: Medicare Other

## 2016-03-31 ENCOUNTER — Ambulatory Visit: Payer: Medicare Other | Admitting: Orthopedic Surgery

## 2016-04-01 ENCOUNTER — Ambulatory Visit (HOSPITAL_COMMUNITY)
Admission: RE | Admit: 2016-04-01 | Discharge: 2016-04-01 | Disposition: A | Discharge status: Home / Self Care | Payer: Medicare Other | Source: Ambulatory Visit | Attending: Internal Medicine | Admitting: Internal Medicine

## 2016-04-01 DIAGNOSIS — I071 Rheumatic tricuspid insufficiency: Secondary | ICD-10-CM | POA: Insufficient documentation

## 2016-04-01 DIAGNOSIS — R0602 Shortness of breath: Secondary | ICD-10-CM | POA: Diagnosis not present

## 2016-04-01 DIAGNOSIS — E119 Type 2 diabetes mellitus without complications: Secondary | ICD-10-CM | POA: Diagnosis not present

## 2016-04-01 LAB — ECHOCARDIOGRAM COMPLETE
E decel time: 359 msec
EERAT: 14.5
FS: 47 % — AB (ref 28–44)
IVS/LV PW RATIO, ED: 1.1
LA ID, A-P, ES: 45 mm
LA diam end sys: 45 mm
LA vol A4C: 67.6 ml
LA vol index: 32.8 mL/m2
LADIAMINDEX: 2.26 cm/m2
LAVOL: 65.2 mL
LDCA: 2.84 cm2
LV E/e' medial: 14.5
LV PW d: 12 mm — AB (ref 0.6–1.1)
LV TDI E'LATERAL: 6.09
LV TDI E'MEDIAL: 6.42
LV e' LATERAL: 6.09 cm/s
LVEEAVG: 14.5
LVOTD: 19 mm
Lateral S' vel: 13.9 cm/s
MV Dec: 359
MV Peak grad: 3 mmHg
MV pk A vel: 112 m/s
MVPKEVEL: 88.3 m/s
Reg peak vel: 244 cm/s
TAPSE: 17.5 mm
TR max vel: 244 cm/s

## 2016-04-01 NOTE — Progress Notes (Signed)
*  PRELIMINARY RESULTS* Echocardiogram 2D Echocardiogram has been performed.  Tiffany Hartman 04/01/2016, 1:46 PM

## 2016-04-06 ENCOUNTER — Emergency Department (HOSPITAL_COMMUNITY): Payer: Medicare Other

## 2016-04-06 ENCOUNTER — Encounter (HOSPITAL_COMMUNITY): Payer: Self-pay | Admitting: Cardiology

## 2016-04-06 ENCOUNTER — Observation Stay (HOSPITAL_COMMUNITY)
Admission: EM | Admit: 2016-04-06 | Discharge: 2016-04-08 | Disposition: A | Discharge status: Home / Self Care | Payer: Medicare Other | Attending: Internal Medicine | Admitting: Internal Medicine

## 2016-04-06 DIAGNOSIS — I10 Essential (primary) hypertension: Secondary | ICD-10-CM | POA: Insufficient documentation

## 2016-04-06 DIAGNOSIS — Z79899 Other long term (current) drug therapy: Secondary | ICD-10-CM | POA: Diagnosis not present

## 2016-04-06 DIAGNOSIS — G20 Parkinson's disease: Secondary | ICD-10-CM | POA: Diagnosis present

## 2016-04-06 DIAGNOSIS — E119 Type 2 diabetes mellitus without complications: Secondary | ICD-10-CM | POA: Diagnosis not present

## 2016-04-06 DIAGNOSIS — R0789 Other chest pain: Principal | ICD-10-CM | POA: Insufficient documentation

## 2016-04-06 DIAGNOSIS — J45909 Unspecified asthma, uncomplicated: Secondary | ICD-10-CM | POA: Diagnosis present

## 2016-04-06 DIAGNOSIS — E1149 Type 2 diabetes mellitus with other diabetic neurological complication: Secondary | ICD-10-CM

## 2016-04-06 DIAGNOSIS — R42 Dizziness and giddiness: Secondary | ICD-10-CM

## 2016-04-06 DIAGNOSIS — Z7984 Long term (current) use of oral hypoglycemic drugs: Secondary | ICD-10-CM | POA: Insufficient documentation

## 2016-04-06 DIAGNOSIS — R079 Chest pain, unspecified: Secondary | ICD-10-CM | POA: Diagnosis not present

## 2016-04-06 LAB — BASIC METABOLIC PANEL
ANION GAP: 6 (ref 5–15)
BUN: 21 mg/dL — AB (ref 6–20)
CALCIUM: 9 mg/dL (ref 8.9–10.3)
CO2: 27 mmol/L (ref 22–32)
Chloride: 110 mmol/L (ref 101–111)
Creatinine, Ser: 1.19 mg/dL — ABNORMAL HIGH (ref 0.44–1.00)
GFR calc Af Amer: 47 mL/min — ABNORMAL LOW (ref >60)
GFR, EST NON AFRICAN AMERICAN: 40 mL/min — AB (ref >60)
Glucose, Bld: 124 mg/dL — ABNORMAL HIGH (ref 65–99)
POTASSIUM: 4.2 mmol/L (ref 3.5–5.1)
SODIUM: 143 mmol/L (ref 135–145)

## 2016-04-06 LAB — D-DIMER, QUANTITATIVE (NOT AT ARMC): D DIMER QUANT: 0.54 ug{FEU}/mL — AB (ref 0.00–0.50)

## 2016-04-06 LAB — CBC
HCT: 32.1 % — ABNORMAL LOW (ref 36.0–46.0)
HEMOGLOBIN: 10.3 g/dL — AB (ref 12.0–15.0)
MCH: 29.6 pg (ref 26.0–34.0)
MCHC: 32.1 g/dL (ref 30.0–36.0)
MCV: 92.2 fL (ref 78.0–100.0)
Platelets: 275 10*3/uL (ref 150–400)
RBC: 3.48 MIL/uL — ABNORMAL LOW (ref 3.87–5.11)
RDW: 14.1 % (ref 11.5–15.5)
WBC: 6.2 10*3/uL (ref 4.0–10.5)

## 2016-04-06 LAB — TROPONIN I

## 2016-04-06 MED ORDER — ASPIRIN 81 MG PO CHEW
324.0000 mg | CHEWABLE_TABLET | Freq: Once | ORAL | Status: AC
  Administered 2016-04-06: 324 mg via ORAL
  Filled 2016-04-06: qty 4

## 2016-04-06 MED ORDER — TRAMADOL-ACETAMINOPHEN 37.5-325 MG PO TABS
1.0000 | ORAL_TABLET | Freq: Once | ORAL | Status: AC
  Administered 2016-04-06: 1 via ORAL
  Filled 2016-04-06: qty 1

## 2016-04-06 NOTE — ED Triage Notes (Addendum)
Chest pain and increasing sob since lunch today.

## 2016-04-06 NOTE — ED Provider Notes (Signed)
[place] [facility] DEPT Provider Note   CSN: [id number] Arrival date & time: 04/06/16  1944   By signing my name below, I, Hilbert Odor, attest that this documentation has been prepared under the direction and in the presence of Daleen Bo, MD. Electronically Signed: Hilbert Odor, Scribe. 04/06/16. 8:57 PM. History   Chief Complaint Chief Complaint  Patient presents with  . Chest Pain    The history is provided by the patient. No language interpreter was used.  HPI Comments: Tiffany Hartman is a 83 y.o. female who presents to the Emergency Department complaining of constant right-sided CP and worsening SOB since 1200 today. She rates the pain 8/10. She states that she was at church when the pain began. The patient also reports pain under her right arm, dizziness, and back pain around her shoulders since earlier today. She has tried taking Tums and Zanax with no relief. She states that this has never happened in the past. She denies any known hx of cardiac issues. she denies fever, coughing, leg pain, and any urinary symptoms.   Past Medical History:  Diagnosis Date  . Asthma   . Breast cancer ([place]) 02/20/83   LT mastectomy  . Bronchitis   . Dementia   . Diabetes mellitus ([place])   . Diverticulitis 2009  . Glaucoma   . HTN (hypertension)   . OA (osteoarthritis)   . Obesity 08/17/2014  . Parkinson disease ([place])   . Stroke ([place])   . Thyroid nodule    Dr Legrand Rams    Patient Active Problem List   Diagnosis Date Noted  . Asthma exacerbation 01/31/2015  . Asthma with status asthmaticus 01/31/2015  . Asthma   . Chest pain 08/17/2014  . Dyspnea 08/17/2014  . Obesity 08/17/2014  . Diabetes ([place]) 08/17/2014  . Dizziness 12/27/2013  . Stroke risk 12/27/2013  . Stroke-like episode ([place]) 12/27/2013  . History of stroke 08/10/2012  . Parkinsons disease ([place]) 08/10/2012  . Left sided numbness 08/10/2012  . Left hemiparesis ([place]) 08/10/2012  . Dysarthria 08/10/2012  . Seizure  disorder ([place]) 08/10/2012  . Other and unspecified hyperlipidemia 08/10/2012  . Sciatica neuralgia 06/02/2012  . DDD (degenerative disc disease), lumbar 06/02/2012  . Hip pain 06/02/2012  . Knee pain 06/02/2012  . Pes anserinus bursitis 06/02/2012  . Abdominal pain 09/01/2011  . SCIATICA 10/01/2009  . SHOULDER, ARTHRITIS, DEGEN./OSTEO 11/27/2008  . KNEE PAIN 07/26/2007  . SHOULDER PAIN 12/01/2006  . History of cardiovascular disorder 12/01/2006    Past Surgical History:  Procedure Laterality Date  .  bilateral catracts    . ABDOMINAL HYSTERECTOMY     complete  . APPENDECTOMY    . EUS  09/10/2011   Procedure: UPPER ENDOSCOPIC ULTRASOUND (EUS) RADIAL;  Surgeon: Arta Silence, MD;  Location: [facility] END[facility]COPY;  Service: Endoscopy;  Laterality: N/A;  christina/ebp  . FINE NEEDLE ASPIRATION  09/10/2011   Procedure: FINE NEEDLE ASPIRATION (FNA) LINEAR;  Surgeon: Arta Silence, MD;  Location: [facility];  Service: Endoscopy;  Laterality: N/A;  . FRACTURE SURGERY  2008    right and left femurs  . JOINT REPLACEMENT  1998/1999   knee boths  . KNEE SURGERY Right    TKA  . KNEE SURGERY Left    TKA  . KNEE SURGERY Right    Periprosthetic fracture /otif  . MASTECTOMY  02/20/1983   left     OB History    No data available       Home Medications    Prior to  Admission medications   Medication Sig Start Date End Date Taking? Authorizing Provider  albuterol (PROVENTIL HFA;VENTOLIN HFA) 108 (90 Base) MCG/ACT inhaler Inhale 1-2 puffs into the lungs every 6 (six) hours as needed for wheezing or shortness of breath.    Historical Provider, MD  ALPRAZolam Duanne Moron) 0.25 MG tablet Take 0.25 mg by mouth 3 (three) times daily.     Historical Provider, MD  amLODipine (NORVASC) 5 MG tablet Take 5 mg by mouth daily.    Historical Provider, MD  Calcium Carbonate-Vitamin D (CALCIUM + D) 600-200 MG-UNIT TABS Take 1 tablet by mouth every evening.     Historical Provider, MD  clopidogrel (PLAVIX) 75 MG  tablet Take 1 tablet (75 mg total) by mouth daily with breakfast. 08/16/12   Rosita Fire, MD  donepezil (ARICEPT) 10 MG tablet Take 10 mg by mouth at bedtime.    Historical Provider, MD  ferrous gluconate (FERGON) 325 MG tablet Take 325 mg by mouth daily with breakfast.    Historical Provider, MD  FLUoxetine (PROZAC) 20 MG capsule Take 20 mg by mouth daily.    Historical Provider, MD  folic acid (FOLVITE) 1 MG tablet Take 1 mg by mouth daily.    Historical Provider, MD  furosemide (LASIX) 40 MG tablet Take 40 mg by mouth daily.     Historical Provider, MD  gabapentin (NEURONTIN) 300 MG capsule Take 1 capsule (300 mg total) by mouth 3 (three) times daily. 06/02/12   Carole Civil, MD  HYDROcodone-acetaminophen (NORCO/VICODIN) 5-325 MG tablet Take 1 tablet by mouth every 6 (six) hours as needed for moderate pain. 01/04/15   Carole Civil, MD  ipratropium-albuterol (DUONEB) 0.5-2.5 (3) MG/3ML SOLN Take 3 mLs by nebulization every 4 (four) hours. Patient taking differently: Take 3 mLs by nebulization 4 (four) times daily.  08/22/14   Rosita Fire, MD  latanoprost (XALATAN) 0.005 % ophthalmic solution Place 1 drop into both eyes at bedtime.    Historical Provider, MD  levETIRAcetam (KEPPRA) 250 MG tablet Take 250 mg by mouth 2 (two) times daily.    Historical Provider, MD  losartan (COZAAR) 100 MG tablet Take 100 mg by mouth daily.    Historical Provider, MD  lovastatin (MEVACOR) 10 MG tablet Take 10 mg by mouth at bedtime.    Historical Provider, MD  omeprazole (PRILOSEC) 20 MG capsule Take 20 mg by mouth 2 (two) times daily.     Historical Provider, MD  potassium chloride SA (K-DUR,KLOR-CON) 20 MEQ tablet Take 20 mEq by mouth 2 (two) times daily.    Historical Provider, MD    Family History Family History  Problem Relation Age of Onset  . Huntington's disease Mother   . Stroke Father   . Colon polyps Daughter 54  . Throat cancer Son     Social History Social History  Substance Use  Topics  . Smoking status: Never Smoker  . Smokeless tobacco: Never Used  . Alcohol use No     Allergies   Patient has no known allergies.   Review of Systems Review of Systems  Constitutional: Negative for fever.  Respiratory: Positive for shortness of breath. Negative for cough.   Cardiovascular: Positive for chest pain.  Genitourinary: Negative for difficulty urinating, dysuria and frequency.  Musculoskeletal: Positive for back pain.  Neurological: Positive for dizziness.  All other systems reviewed and are negative.    Physical Exam Updated Vital Signs BP (!) 157/69   Pulse 66   Temp 98.2 F (36.8 C) (Oral)  Resp (!) 23   Ht 5\' 1"  (1.549 m)   Wt 224 lb (101.6 kg)   SpO2 100%   BMI 42.32 kg/m   Physical Exam  Constitutional: She is oriented to person, place, and time. She appears well-developed and well-nourished.  HENT:  Head: Normocephalic and atraumatic.  Eyes: Conjunctivae and EOM are normal. Pupils are equal, round, and reactive to light.  Right eye has cloudy anterior chamber.  Neck: Normal range of motion and phonation normal. Neck supple.  Cardiovascular: Normal rate and regular rhythm.   Murmur heard.  2/6 systolic murmur at base.  Pulmonary/Chest: Effort normal and breath sounds normal. She has no wheezes. She has no rhonchi. She has no rales. She exhibits tenderness (Tender right lateral chest wall without crepitation, or deformity).  No wheezes, rales, or rhonchi.  Abdominal: Soft. She exhibits no distension. There is no tenderness. There is no guarding.  Musculoskeletal: Normal range of motion. She exhibits edema.  1+ edema of lower legs. Normal strength of arms and legs bilaterally.   Neurological: She is alert and oriented to person, place, and time. She exhibits normal muscle tone.  No dysarthria, aphasia, and nystagmus. Mild tremor consistent with parkinson's disease.  Skin: Skin is warm and dry.  Psychiatric: She has a normal mood and affect.  Her behavior is normal. Judgment and thought content normal.  Nursing note and vitals reviewed.   ED Treatments / Results  DIAGNOSTIC STUDIES: Oxygen Saturation is 99% on RA, normal by my interpretation.    COORDINATION OF CARE: 8:40 PM Discussed treatment plan with pt at bedside and pt agreed to plan. I will do a full cardiac work-up for the patient.  Labs (all labs ordered are listed, but only abnormal results are displayed) Labs Reviewed  BASIC METABOLIC PANEL - Abnormal; Notable for the following:       Result Value   Glucose, Bld 124 (*)    BUN 21 (*)    Creatinine, Ser 1.19 (*)    GFR calc non Af Amer 40 (*)    GFR calc Af Amer 47 (*)    All other components within normal limits  CBC - Abnormal; Notable for the following:    RBC 3.48 (*)    Hemoglobin 10.3 (*)    HCT 32.1 (*)    All other components within normal limits  D-DIMER, QUANTITATIVE (NOT AT [facility]) - Abnormal; Notable for the following:    D-Dimer, Quant 0.54 (*)    All other components within normal limits  TROPONIN I    EKG  EKG Interpretation  Date/Time:  Sunday April 06 2016 19:53:28 EDT Ventricular Rate:  75 PR Interval:    QRS Duration: 79 QT Interval:  389 QTC Calculation: 435 R Axis:   -15 Text Interpretation:  Sinus rhythm Atrial premature complex Borderline left axis deviation No significant change since last tracing Confirmed by Winfred Leeds  MD, SAM [id number]) on 04/06/2016 7:58:39 PM       Radiology Dg Chest 2 View  Result Date: 04/06/2016 CLINICAL DATA:  Chest pain and increasing shortness of breast since launch today, remote breast cancer, history diabetes mellitus, hypertension, asthma, Parkinson's EXAM: CHEST  2 VIEW COMPARISON:  03/25/2016 FINDINGS: Enlargement of cardiac silhouette. Aortic atherosclerosis. Mediastinal contours and pulmonary vascularity normal. Lungs clear. No pleural effusion or pneumothorax. Scattered endplate spur formation thoracic spine. Surgical clips LEFT axilla  question prior axillary node dissection. BILATERAL glenohumeral degenerative changes more advanced on LEFT. IMPRESSION: No acute abnormalities. Minimal enlargement of cardiac  silhouette. Aortic atherosclerosis. Electronically Signed   By: Lavonia Dana M.D.   On: 04/06/2016 21:00    Procedures Procedures (including critical care time)  Medications Ordered in ED Medications  traMADol-acetaminophen (ULTRACET) 37.5-325 MG per tablet 1 tablet (1 tablet Oral Given 04/06/16 2233)  aspirin chewable tablet 324 mg (324 mg Oral Given 04/06/16 2325)     Initial Impression / Assessment and Plan / ED Course  I have reviewed the triage vital signs and the nursing notes.  Pertinent labs & imaging results that were available during my care of the patient were reviewed by me and considered in my medical decision making (see chart for details).     Medications  traMADol-acetaminophen (ULTRACET) 37.5-325 MG per tablet 1 tablet (1 tablet Oral Given 04/06/16 2233)  aspirin chewable tablet 324 mg (324 mg Oral Given 04/06/16 2325)    Patient Vitals for the past 24 hrs:  BP Temp Temp src Pulse Resp SpO2 Height Weight  04/06/16 2300 (!) 157/69 - - 66 (!) 23 100 % - -  04/06/16 2239 (!) 153/80 - - 80 20 100 % - -  04/06/16 2230 (!) 153/80 - - 72 (!) 25 100 % - -  04/06/16 2200 (!) 144/65 - - 68 20 100 % - -  04/06/16 2130 (!) 150/64 - - 66 (!) 23 100 % - -  04/06/16 2115 (!) 144/59 - - 78 20 100 % - -  04/06/16 2030 (!) 146/57 - - 70 (!) 23 100 % - -  04/06/16 1957 (!) 147/59 98.2 F (36.8 C) Oral 71 (!) 28 99 % 5\' 1"  (1.549 m) 224 lb (101.6 kg)    10:22 PM Reevaluation with update and discussion. After initial assessment and treatment, an updated evaluation reveals she continues to report chest discomfort, and dizziness.  She states that she has had "inner ear" before but this dizziness feels different.  She took Tylenol earlier at home for pain without relief.  These discussed with patient and family members,  they are concerned, and would like additional testing performed.  They agree to admission.Daleen Bo L   10:26 PM-Consult complete with hospitalist. Patient case explained and discussed.  He agrees to admit patient for further evaluation and treatment. Call ended at 22: 35   Final Clinical Impressions(s) / ED Diagnoses   Final diagnoses:  Nonspecific chest pain  Dizziness    Ongoing chest pain with negative initial evaluation.  No clear evidence for ACS, or pneumonia.  Minimally elevated d-dimer, age-adjusted normal.  Doubt PE.  Nonspecific dizziness, with nonfocal neurologic exam, complicated somewhat by early parkinsonian disorder with tremor.  Mild chest wall tenderness, is nonspecific but may be the source of her right-sided chest pain.  She is a frail elderly patient, who needs additional evaluation for reassurance.  Nursing Notes Reviewed/ Care Coordinated Applicable Imaging Reviewed Interpretation of Laboratory Data incorporated into ED treatment  Plan: Admit  New Prescriptions New Prescriptions   No medications on file   I personally performed the services described in this documentation, which was scribed in my presence. The recorded information has been reviewed and is accurate.    Daleen Bo, MD 04/07/16 [phone]

## 2016-04-06 NOTE — H&P (Signed)
History and Physical    Tiffany Hartman OFB:[id number] DOB: 12-21-1927 DOA: 04/06/2016  PCP: Rosita Fire, MD  Patient coming from: Home.    Chief Complaint:  Right sided chest pain and SOB.   HPI: Tiffany Hartman is an 83 y.o. female with hx of asthma, breast CA, DM, parkinson's disease, HTN, Dementia, presented to the ER with sudden onset of right sided chest pain, some SOB, and lightheadedness.  She did not describe her dizziness as vertigo, and had no neurological deficit.  She said her right sided CP was palpable, and her SOB was better with her respiratory Tx.  EKG showed no acute ischemic changes, and initial troponin was negative.  Her D Dimer was 0.57, and her recent ECHO showed normal wall motion, with EF 60%, and no significant valvular abnormality.  Hospitalist was asked to admit her for chest pain r/out.   ED Course:  See above.  Rewiew of Systems:  Constitutional: Negative for malaise, fever and chills. No significant weight loss or weight gain Eyes: Negative for eye pain, redness and discharge, diplopia, visual changes, or flashes of light. ENMT: Negative for ear pain, hoarseness, nasal congestion, sinus pressure and sore throat. No headaches; tinnitus, drooling, or problem swallowing. Cardiovascular: Negative for  palpitations, diaphoresis, dyspnea and peripheral edema. ; No orthopnea, PND Respiratory: Negative for cough, hemoptysis, wheezing and stridor. No pleuritic chestpain. Gastrointestinal: Negative for diarrhea, constipation,  melena, blood in stool, hematemesis, jaundice and rectal bleeding.    Genitourinary: Negative for frequency, dysuria, incontinence,flank pain and hematuria; Musculoskeletal: Negative for back pain and neck pain. Negative for swelling and trauma.;  Skin: . Negative for pruritus, rash, abrasions, bruising and skin lesion.; ulcerations Neuro: Negative for headache, lightheadedness and neck stiffness. Negative for weakness, altered level of consciousness ,  altered mental status, extremity weakness, burning feet, involuntary movement, seizure and syncope.  Psych: negative for anxiety, depression, insomnia, tearfulness, panic attacks, hallucinations, paranoia, suicidal or homicidal ideation    Past Medical History:  Diagnosis Date  . Asthma   . Breast cancer ([place]) 02/20/83   LT mastectomy  . Bronchitis   . Dementia   . Diabetes mellitus ([place])   . Diverticulitis 2009  . Glaucoma   . HTN (hypertension)   . OA (osteoarthritis)   . Obesity 08/17/2014  . Parkinson disease ([place])   . Stroke ([place])   . Thyroid nodule    Dr Legrand Rams    Past Surgical History:  Procedure Laterality Date  .  bilateral catracts    . ABDOMINAL HYSTERECTOMY     complete  . APPENDECTOMY    . EUS  09/10/2011   Procedure: UPPER ENDOSCOPIC ULTRASOUND (EUS) RADIAL;  Surgeon: Arta Silence, MD;  Location: [facility];  Service: Endoscopy;  Laterality: N/A;  christina/ebp  . FINE NEEDLE ASPIRATION  09/10/2011   Procedure: FINE NEEDLE ASPIRATION (FNA) LINEAR;  Surgeon: Arta Silence, MD;  [facility]: [facility];  Service: Endoscopy;  Laterality: N/A;  . FRACTURE SURGERY  2008    right and left femurs  . JOINT REPLACEMENT  1998/1999   knee boths  . KNEE SURGERY Right    TKA  . KNEE SURGERY Left    TKA  . KNEE SURGERY Right    Periprosthetic fracture /otif  . MASTECTOMY  02/20/1983   left      reports that she has never smoked. She has never used smokeless tobacco. She reports that she does not drink alcohol or use drugs.  No Known Allergies  Family History  Problem Relation Age of Onset  . Huntington's disease Mother   . Stroke Father   . Colon polyps Daughter 44  . Throat cancer Son      Prior to Admission medications   Medication Sig Start Date End Date Taking? Authorizing Provider  albuterol (PROVENTIL HFA;VENTOLIN HFA) 108 (90 Base) MCG/ACT inhaler Inhale 1-2 puffs into the lungs every 6 (six) hours as needed for wheezing or shortness of breath.     Historical Provider, MD  ALPRAZolam Duanne Moron) 0.25 MG tablet Take 0.25 mg by mouth 3 (three) times daily.     Historical Provider, MD  amLODipine (NORVASC) 5 MG tablet Take 5 mg by mouth daily.    Historical Provider, MD  Calcium Carbonate-Vitamin D (CALCIUM + D) 600-200 MG-UNIT TABS Take 1 tablet by mouth every evening.     Historical Provider, MD  clopidogrel (PLAVIX) 75 MG tablet Take 1 tablet (75 mg total) by mouth daily with breakfast. 08/16/12   Rosita Fire, MD  donepezil (ARICEPT) 10 MG tablet Take 10 mg by mouth at bedtime.    Historical Provider, MD  ferrous gluconate (FERGON) 325 MG tablet Take 325 mg by mouth daily with breakfast.    Historical Provider, MD  FLUoxetine (PROZAC) 20 MG capsule Take 20 mg by mouth daily.    Historical Provider, MD  folic acid (FOLVITE) 1 MG tablet Take 1 mg by mouth daily.    Historical Provider, MD  furosemide (LASIX) 40 MG tablet Take 40 mg by mouth daily.     Historical Provider, MD  gabapentin (NEURONTIN) 300 MG capsule Take 1 capsule (300 mg total) by mouth 3 (three) times daily. 06/02/12   Carole Civil, MD  HYDROcodone-acetaminophen (NORCO/VICODIN) 5-325 MG tablet Take 1 tablet by mouth every 6 (six) hours as needed for moderate pain. 01/04/15   Carole Civil, MD  ipratropium-albuterol (DUONEB) 0.5-2.5 (3) MG/3ML SOLN Take 3 mLs by nebulization every 4 (four) hours. Patient taking differently: Take 3 mLs by nebulization 4 (four) times daily.  08/22/14   Rosita Fire, MD  latanoprost (XALATAN) 0.005 % ophthalmic solution Place 1 drop into both eyes at bedtime.    Historical Provider, MD  levETIRAcetam (KEPPRA) 250 MG tablet Take 250 mg by mouth 2 (two) times daily.    Historical Provider, MD  losartan (COZAAR) 100 MG tablet Take 100 mg by mouth daily.    Historical Provider, MD  lovastatin (MEVACOR) 10 MG tablet Take 10 mg by mouth at bedtime.    Historical Provider, MD  omeprazole (PRILOSEC) 20 MG capsule Take 20 mg by mouth 2 (two) times  daily.     Historical Provider, MD  potassium chloride SA (K-DUR,KLOR-CON) 20 MEQ tablet Take 20 mEq by mouth 2 (two) times daily.    Historical Provider, MD    Physical Exam: Vitals:   04/06/16 2030 04/06/16 2115 04/06/16 2130 04/06/16 2239  BP: (!) 146/57 (!) 144/59 (!) 150/64 (!) 153/80  Pulse: 70 78 66 80  Resp: (!) 23 20 (!) 23 20  Temp:      TempSrc:      SpO2: 100% 100% 100% 100%  Weight:      Height:          Constitutional: NAD, calm, comfortable Vitals:   04/06/16 2030 04/06/16 2115 04/06/16 2130 04/06/16 2239  BP: (!) 146/57 (!) 144/59 (!) 150/64 (!) 153/80  Pulse: 70 78 66 80  Resp: (!) 23 20 (!) 23 20  Temp:      TempSrc:  SpO2: 100% 100% 100% 100%  Weight:      Height:       Eyes: PERRL, lids and conjunctivae normal ENMT: Mucous membranes are moist. Posterior pharynx clear of any exudate or lesions.Normal dentition.  Neck: normal, supple, no masses, no thyromegaly Respiratory: clear to auscultation bilaterally, no wheezing, no crackles. Normal respiratory effort. No accessory muscle use.  Cardiovascular: Regular rate and rhythm, no murmurs / rubs / gallops. No extremity edema. 2+ pedal pulses. No carotid bruits.  Abdomen: no tenderness, no masses palpated. No hepatosplenomegaly. Bowel sounds positive.  Musculoskeletal: no clubbing / cyanosis. No joint deformity upper and lower extremities. Good ROM, no contractures. Normal muscle tone.  Skin: no rashes, lesions, ulcers. No induration Neurologic: CN 2-12 grossly intact. Sensation intact, DTR normal. Strength 5/5 in all 4.  Psychiatric: Normal judgment and insight. Alert and oriented x 3. Normal mood.     Labs on Admission: I have personally reviewed following labs and imaging studies  CBC:  Recent Labs Lab 04/06/16 2039  WBC 6.2  HGB 10.3*  HCT 32.1*  MCV 92.2  PLT 182   Basic Metabolic Panel:  Recent Labs Lab 04/06/16 2039  NA 143  K 4.2  CL 110  CO2 27  GLUCOSE 124*  BUN 21*    CREATININE 1.19*  CALCIUM 9.0   Cardiac Enzymes:  Recent Labs Lab 04/06/16 2039  TROPONINI <0.03   Urine analysis:    Component Value Date/Time   COLORURINE YELLOW 08/22/2014 1300   APPEARANCEUR CLEAR 08/22/2014 1300   LABSPEC 1.010 08/22/2014 1300   PHURINE 6.0 08/22/2014 1300   GLUCOSEU NEGATIVE 08/22/2014 1300   HGBUR NEGATIVE 08/22/2014 1300   BILIRUBINUR NEGATIVE 08/22/2014 1300   KETONESUR NEGATIVE 08/22/2014 1300   PROTEINUR NEGATIVE 08/22/2014 1300   UROBILINOGEN 0.2 08/22/2014 1300   NITRITE NEGATIVE 08/22/2014 1300   LEUKOCYTESUR NEGATIVE 08/22/2014 1300   Radiological Exams on Admission: Dg Chest 2 View  Result Date: 04/06/2016 CLINICAL DATA:  Chest pain and increasing shortness of breast since launch today, remote breast cancer, history diabetes mellitus, hypertension, asthma, Parkinson's EXAM: CHEST  2 VIEW COMPARISON:  03/25/2016 FINDINGS: Enlargement of cardiac silhouette. Aortic atherosclerosis. Mediastinal contours and pulmonary vascularity normal. Lungs clear. No pleural effusion or pneumothorax. Scattered endplate spur formation thoracic spine. Surgical clips LEFT axilla question prior axillary node dissection. BILATERAL glenohumeral degenerative changes more advanced on LEFT. IMPRESSION: No acute abnormalities. Minimal enlargement of cardiac silhouette. Aortic atherosclerosis. Electronically Signed   By: Lavonia Dana M.D.   On: 04/06/2016 21:00    EKG: Independently reviewed.  Assessment/Plan Principal Problem:   Chest pain Active Problems:   Parkinsons disease ([place])   Dizziness   Diabetes ([place])   Asthma    PLAN:   Atypical Chestpain; Her chest pain is palpable.  I suspect she doesn' t have ACS.  Will give ASA, continue with statin, and cycle her troponin.  Will cotninue with her pain meds. D Dimer is only slightly elevated.  No clinical evidence of PE.   Lightheadedness:  Is better.  Doubt VBI as she has no vertigo.  Will follow.  DM: Check QID  CBG, and cover if elevated.  Dementia:  Continue with her meds.   SOB:  Better with nebs.  Will continue her inhaler.    DVT prophylaxis: Lovenox.  Code Status: FULL CODE.  Family Communication: daughter at bedside.  Disposition Plan: home.  Consults called: None.  Admission status: OBS>    Jarvis Sawa MD FACP. [facility]  If 7PM-7AM, please contact night-coverage [email or web address] Password [email or web address]  04/06/2016, 11:05 PM

## 2016-04-06 NOTE — ED Notes (Signed)
EKG given to Dr. Skipper Cliche.

## 2016-04-07 DIAGNOSIS — I10 Essential (primary) hypertension: Secondary | ICD-10-CM | POA: Diagnosis not present

## 2016-04-07 DIAGNOSIS — R079 Chest pain, unspecified: Secondary | ICD-10-CM | POA: Diagnosis not present

## 2016-04-07 DIAGNOSIS — E1165 Type 2 diabetes mellitus with hyperglycemia: Secondary | ICD-10-CM | POA: Diagnosis not present

## 2016-04-07 LAB — GLUCOSE, CAPILLARY
GLUCOSE-CAPILLARY: 78 mg/dL (ref 65–99)
GLUCOSE-CAPILLARY: 135 mg/dL — AB (ref 65–99)
Glucose-Capillary: 105 mg/dL — ABNORMAL HIGH (ref 65–99)
Glucose-Capillary: 137 mg/dL — ABNORMAL HIGH (ref 65–99)

## 2016-04-07 LAB — TROPONIN I
Troponin I: <0.03 ng/mL (ref <0.03)
Troponin I: <0.03 ng/mL (ref <0.03)

## 2016-04-07 MED ORDER — AMLODIPINE BESYLATE 5 MG PO TABS
5.0000 mg | ORAL_TABLET | Freq: Every day | ORAL | Status: DC
  Administered 2016-04-07 – 2016-04-08 (×2): 5 mg via ORAL
  Filled 2016-04-07 (×2): qty 1

## 2016-04-07 MED ORDER — KETOROLAC TROMETHAMINE 30 MG/ML IJ SOLN
30.0000 mg | Freq: Once | INTRAMUSCULAR | Status: AC
  Administered 2016-04-07: 30 mg via INTRAVENOUS
  Filled 2016-04-07: qty 1

## 2016-04-07 MED ORDER — POTASSIUM CHLORIDE CRYS ER 20 MEQ PO TBCR
20.0000 meq | EXTENDED_RELEASE_TABLET | Freq: Two times a day (BID) | ORAL | Status: DC
  Administered 2016-04-07 – 2016-04-08 (×4): 20 meq via ORAL
  Filled 2016-04-07 (×4): qty 1

## 2016-04-07 MED ORDER — INSULIN ASPART 100 UNIT/ML ~~LOC~~ SOLN
0.0000 [IU] | Freq: Three times a day (TID) | SUBCUTANEOUS | Status: DC
  Administered 2016-04-07: 1 [IU] via SUBCUTANEOUS

## 2016-04-07 MED ORDER — PRAVASTATIN SODIUM 10 MG PO TABS
10.0000 mg | ORAL_TABLET | Freq: Every day | ORAL | Status: DC
  Administered 2016-04-07: 10 mg via ORAL
  Filled 2016-04-07: qty 1

## 2016-04-07 MED ORDER — CALCIUM CARBONATE-VITAMIN D 500-200 MG-UNIT PO TABS
1.0000 | ORAL_TABLET | Freq: Every evening | ORAL | Status: DC
  Administered 2016-04-07: 1 via ORAL
  Filled 2016-04-07: qty 1

## 2016-04-07 MED ORDER — CLOPIDOGREL BISULFATE 75 MG PO TABS
75.0000 mg | ORAL_TABLET | Freq: Every day | ORAL | Status: DC
  Administered 2016-04-07 – 2016-04-08 (×2): 75 mg via ORAL
  Filled 2016-04-07 (×2): qty 1

## 2016-04-07 MED ORDER — INSULIN ASPART 100 UNIT/ML ~~LOC~~ SOLN: 0.0000 [IU] | Freq: Every day | SUBCUTANEOUS | Status: DC

## 2016-04-07 MED ORDER — SODIUM CHLORIDE 0.9% FLUSH
3.0000 mL | Freq: Two times a day (BID) | INTRAVENOUS | Status: DC
  Administered 2016-04-07 (×2): 3 mL via INTRAVENOUS

## 2016-04-07 MED ORDER — HYDROCODONE-ACETAMINOPHEN 5-325 MG PO TABS
1.0000 | ORAL_TABLET | Freq: Four times a day (QID) | ORAL | Status: DC | PRN
Start: 2016-04-07 — End: 2016-04-07
  Administered 2016-04-07: 1 via ORAL
  Filled 2016-04-07: qty 1

## 2016-04-07 MED ORDER — LOSARTAN POTASSIUM 50 MG PO TABS
100.0000 mg | ORAL_TABLET | Freq: Every day | ORAL | Status: DC
  Administered 2016-04-07 – 2016-04-08 (×2): 100 mg via ORAL
  Filled 2016-04-07 (×2): qty 2

## 2016-04-07 MED ORDER — FLUOXETINE HCL 20 MG PO CAPS
20.0000 mg | ORAL_CAPSULE | Freq: Every day | ORAL | Status: DC
  Administered 2016-04-07 – 2016-04-08 (×2): 20 mg via ORAL
  Filled 2016-04-07 (×2): qty 1

## 2016-04-07 MED ORDER — ALPRAZOLAM 0.25 MG PO TABS
0.2500 mg | ORAL_TABLET | Freq: Three times a day (TID) | ORAL | Status: DC
Start: 2016-04-07 — End: 2016-04-08
  Administered 2016-04-07 – 2016-04-08 (×4): 0.25 mg via ORAL
  Filled 2016-04-07 (×4): qty 1

## 2016-04-07 MED ORDER — LATANOPROST 0.005 % OP SOLN
1.0000 [drp] | Freq: Every day | OPHTHALMIC | Status: DC
  Administered 2016-04-07: 1 [drp] via OPHTHALMIC
  Filled 2016-04-07: qty 2.5

## 2016-04-07 MED ORDER — FUROSEMIDE 40 MG PO TABS
40.0000 mg | ORAL_TABLET | Freq: Every day | ORAL | Status: DC
  Administered 2016-04-07 – 2016-04-08 (×2): 40 mg via ORAL
  Filled 2016-04-07 (×2): qty 1

## 2016-04-07 MED ORDER — ORAL CARE MOUTH RINSE
15.0000 mL | Freq: Two times a day (BID) | OROMUCOSAL | Status: DC
  Administered 2016-04-07: 15 mL via OROMUCOSAL

## 2016-04-07 MED ORDER — GABAPENTIN 300 MG PO CAPS
300.0000 mg | ORAL_CAPSULE | Freq: Three times a day (TID) | ORAL | Status: DC
  Administered 2016-04-07 – 2016-04-08 (×4): 300 mg via ORAL
  Filled 2016-04-07 (×5): qty 1

## 2016-04-07 MED ORDER — LEVETIRACETAM 250 MG PO TABS
250.0000 mg | ORAL_TABLET | Freq: Two times a day (BID) | ORAL | Status: DC
  Administered 2016-04-07 – 2016-04-08 (×4): 250 mg via ORAL
  Filled 2016-04-07 (×4): qty 1

## 2016-04-07 MED ORDER — HYDROCODONE-ACETAMINOPHEN 5-325 MG PO TABS
2.0000 | ORAL_TABLET | Freq: Four times a day (QID) | ORAL | Status: DC | PRN
  Administered 2016-04-07 – 2016-04-08 (×3): 2 via ORAL
  Filled 2016-04-07 (×4): qty 2

## 2016-04-07 MED ORDER — ENOXAPARIN SODIUM 40 MG/0.4ML ~~LOC~~ SOLN
40.0000 mg | SUBCUTANEOUS | Status: DC
  Administered 2016-04-07 – 2016-04-08 (×2): 40 mg via SUBCUTANEOUS
  Filled 2016-04-07 (×2): qty 0.4

## 2016-04-07 MED ORDER — FOLIC ACID 1 MG PO TABS
1.0000 mg | ORAL_TABLET | Freq: Every day | ORAL | Status: DC
  Administered 2016-04-07 – 2016-04-08 (×2): 1 mg via ORAL
  Filled 2016-04-07 (×2): qty 1

## 2016-04-07 MED ORDER — ALBUTEROL SULFATE (2.5 MG/3ML) 0.083% IN NEBU
3.0000 mL | INHALATION_SOLUTION | Freq: Four times a day (QID) | RESPIRATORY_TRACT | Status: DC | PRN
  Administered 2016-04-08: 3 mL via RESPIRATORY_TRACT
  Filled 2016-04-07: qty 3

## 2016-04-07 MED ORDER — IPRATROPIUM-ALBUTEROL 0.5-2.5 (3) MG/3ML IN SOLN
3.0000 mL | Freq: Four times a day (QID) | RESPIRATORY_TRACT | Status: DC
  Administered 2016-04-07 – 2016-04-08 (×4): 3 mL via RESPIRATORY_TRACT
  Filled 2016-04-07 (×3): qty 3

## 2016-04-07 MED ORDER — DONEPEZIL HCL 5 MG PO TABS
10.0000 mg | ORAL_TABLET | Freq: Every day | ORAL | Status: DC
  Administered 2016-04-07 (×2): 10 mg via ORAL
  Filled 2016-04-07 (×2): qty 2

## 2016-04-07 MED ORDER — PANTOPRAZOLE SODIUM 40 MG PO TBEC
40.0000 mg | DELAYED_RELEASE_TABLET | Freq: Every day | ORAL | Status: DC
  Administered 2016-04-07 – 2016-04-08 (×2): 40 mg via ORAL
  Filled 2016-04-07 (×2): qty 1

## 2016-04-07 NOTE — Progress Notes (Signed)
Subjective: Patient was admitted yesterday with right sided chest pain and shortness of breath. Her EKG and cardiac enzymes were negative for ACS. Patient is getting pain medications but continue to complain of pain. Claims she feels weak.  Objective: Vital signs in last 24 hours: Temp:  [98 F (36.7 C)-98.5 F (36.9 C)] 98 F (36.7 C) (04/02 0627) Pulse Rate:  [64-80] 64 (04/02 0627) Resp:  [20-28] 20 (04/02 0627) BP: (114-197)/(48-100) 114/48 (04/02 0627) SpO2:  [99 %-100 %] 100 % (04/02 0627) Weight:  [101.6 kg (224 lb)-103.6 kg (228 lb 6.4 oz)] 103.6 kg (228 lb 6.4 oz) (04/02 0048) Weight change:     Intake/Output from previous day: 04/01 0701 - 04/02 0700 In: -  Out: 300 [Urine:300]  PHYSICAL EXAM General appearance: alert, no distress and moderately obese Resp: diminished breath sounds bilaterally and rhonchi bilaterally Cardio: S1, S2 normal GI: soft, non-tender; bowel sounds normal; no masses,  no organomegaly Extremities: extremities normal, atraumatic, no cyanosis or edema  Lab Results:  Results for orders placed or performed during the hospital encounter of 04/06/16 (from the past 48 hour(s))  Basic metabolic panel     Status: Abnormal   Collection Time: 04/06/16  8:39 PM  Result Value Ref Range   Sodium 143 135 - 145 mmol/L   Potassium 4.2 3.5 - 5.1 mmol/L   Chloride 110 101 - 111 mmol/L   CO2 27 22 - 32 mmol/L   Glucose, Bld 124 (H) 65 - 99 mg/dL   BUN 21 (H) 6 - 20 mg/dL   Creatinine, Ser 1.19 (H) 0.44 - 1.00 mg/dL   Calcium 9.0 8.9 - 10.3 mg/dL   GFR calc non Af Amer 40 (L) >60 mL/min   GFR calc Af Amer 47 (L) >60 mL/min    Comment: (NOTE) The eGFR has been calculated using the CKD EPI equation. This calculation has not been validated in all clinical situations. eGFR's persistently <60 mL/min signify possible Chronic Kidney Disease.    Anion gap 6 5 - 15  CBC     Status: Abnormal   Collection Time: 04/06/16  8:39 PM  Result Value Ref Range   WBC  6.2 4.0 - 10.5 K/uL   RBC 3.48 (L) 3.87 - 5.11 MIL/uL   Hemoglobin 10.3 (L) 12.0 - 15.0 g/dL   HCT 32.1 (L) 36.0 - 46.0 %   MCV 92.2 78.0 - 100.0 fL   MCH 29.6 26.0 - 34.0 pg   MCHC 32.1 30.0 - 36.0 g/dL   RDW 14.1 11.5 - 15.5 %   Platelets 275 150 - 400 K/uL  Troponin I     Status: None   Collection Time: 04/06/16  8:39 PM  Result Value Ref Range   Troponin I <0.03 <0.03 ng/mL  D-dimer, quantitative     Status: Abnormal   Collection Time: 04/06/16  8:44 PM  Result Value Ref Range   D-Dimer, Quant 0.54 (H) 0.00 - 0.50 ug/mL-FEU    Comment: (NOTE) At the manufacturer cut-off of 0.50 ug/mL FEU, this assay has been documented to exclude PE with a sensitivity and negative predictive value of 97 to 99%.  At this time, this assay has not been approved by the FDA to exclude DVT/VTE. Results should be correlated with clinical presentation.   Troponin I     Status: None   Collection Time: 04/07/16  1:24 AM  Result Value Ref Range   Troponin I <0.03 <0.03 ng/mL  Glucose, capillary     Status: Abnormal  Collection Time: 04/07/16  7:49 AM  Result Value Ref Range   Glucose-Capillary 105 (H) 65 - 99 mg/dL   Comment 1 Notify RN    Comment 2 Document in Chart     ABGS No results for input(s): PHART, PO2ART, TCO2, HCO3 in the last 72 hours.  Invalid input(s): PCO2 CULTURES No results found for this or any previous visit (from the past 240 hour(s)). Studies/Results: Dg Chest 2 View  Result Date: 04/06/2016 CLINICAL DATA:  Chest pain and increasing shortness of breast since launch today, remote breast cancer, history diabetes mellitus, hypertension, asthma, Parkinson's EXAM: CHEST  2 VIEW COMPARISON:  03/25/2016 FINDINGS: Enlargement of cardiac silhouette. Aortic atherosclerosis. Mediastinal contours and pulmonary vascularity normal. Lungs clear. No pleural effusion or pneumothorax. Scattered endplate spur formation thoracic spine. Surgical clips LEFT axilla question prior axillary node  dissection. BILATERAL glenohumeral degenerative changes more advanced on LEFT. IMPRESSION: No acute abnormalities. Minimal enlargement of cardiac silhouette. Aortic atherosclerosis. Electronically Signed   By: Lavonia Dana M.D.   On: 04/06/2016 21:00    Medications: I have reviewed the patient's current medications.  Assesment:  Principal Problem:   Chest pain Active Problems:   Parkinsons disease ([place])   Dizziness   Diabetes ([place])   Asthma    Plan:  Medications reviewed Will adjust her pain medications Continue regular medications Continue telemetry    LOS: 0 days   Annalea Alguire 04/07/2016, 8:06 AM

## 2016-04-07 NOTE — Care Management Obs Status (Signed)
[facility] NOTIFICATION   Patient Details  Name: Tiffany Hartman MRN: [id number] Date of Birth: 07-Jul-1927   Medicare Observation Status Notification Given:  Yes    Sharni Negron, Chauncey Reading, RN 04/07/2016, 1:01 PM

## 2016-04-07 NOTE — Care Management Note (Signed)
Case Management Note  Patient Details  Name: Tiffany Hartman MRN: [id number] Date of Birth: February 13, 1927  Subjective/Objective:                  Adm with CP. Lives with daughter. Walks with cane PTA. She has home O2 at 2 L continuous provided by [facility]. She has no HH PTA. She has PCP, children drive her to appointments. Reports no issues affording medications.   Action/Plan: Anticipate DC home with self care.    Expected Discharge Date:      04/08/2016            Expected Discharge Plan:  Home/Self Care  In-House Referral:     Discharge planning Services  CM Consult  Post Acute Care Choice:    Choice offered to:  NA  DME Arranged:    DME Agency:     HH Arranged:    HH Agency:     Status of Service:  Completed, signed off  If discussed at [employer] of Stay Meetings, dates discussed:    Additional Comments:  Timisha Mondry, Chauncey Reading, RN 04/07/2016, 2:34 PM

## 2016-04-08 DIAGNOSIS — I10 Essential (primary) hypertension: Secondary | ICD-10-CM | POA: Diagnosis not present

## 2016-04-08 DIAGNOSIS — R079 Chest pain, unspecified: Secondary | ICD-10-CM | POA: Diagnosis not present

## 2016-04-08 DIAGNOSIS — E1165 Type 2 diabetes mellitus with hyperglycemia: Secondary | ICD-10-CM | POA: Diagnosis not present

## 2016-04-08 LAB — BASIC METABOLIC PANEL
ANION GAP: 4 — AB (ref 5–15)
BUN: 25 mg/dL — AB (ref 6–20)
CO2: 27 mmol/L (ref 22–32)
Calcium: 8.6 mg/dL — ABNORMAL LOW (ref 8.9–10.3)
Chloride: 109 mmol/L (ref 101–111)
Creatinine, Ser: 1.25 mg/dL — ABNORMAL HIGH (ref 0.44–1.00)
GFR calc Af Amer: 44 mL/min — ABNORMAL LOW (ref >60)
GFR, EST NON AFRICAN AMERICAN: 38 mL/min — AB (ref >60)
Glucose, Bld: 121 mg/dL — ABNORMAL HIGH (ref 65–99)
POTASSIUM: 4.6 mmol/L (ref 3.5–5.1)
SODIUM: 140 mmol/L (ref 135–145)

## 2016-04-08 LAB — GLUCOSE, CAPILLARY: Glucose-Capillary: 107 mg/dL — ABNORMAL HIGH (ref 65–99)

## 2016-04-08 LAB — CBC
HEMATOCRIT: 32.7 % — AB (ref 36.0–46.0)
HEMOGLOBIN: 10.2 g/dL — AB (ref 12.0–15.0)
MCH: 29.2 pg (ref 26.0–34.0)
MCHC: 31.2 g/dL (ref 30.0–36.0)
MCV: 93.7 fL (ref 78.0–100.0)
Platelets: 259 10*3/uL (ref 150–400)
RBC: 3.49 MIL/uL — ABNORMAL LOW (ref 3.87–5.11)
RDW: 14.1 % (ref 11.5–15.5)
WBC: 5.4 10*3/uL (ref 4.0–10.5)

## 2016-04-08 MED ORDER — HYDROCODONE-ACETAMINOPHEN 5-325 MG PO TABS: 1.0000 | ORAL_TABLET | Freq: Four times a day (QID) | ORAL | 0 refills | Status: DC | PRN

## 2016-04-08 MED ORDER — IPRATROPIUM-ALBUTEROL 0.5-2.5 (3) MG/3ML IN SOLN: 3.0000 mL | Freq: Three times a day (TID) | RESPIRATORY_TRACT | Status: DC

## 2016-04-08 NOTE — Discharge Summary (Signed)
Physician Discharge Summary  Patient ID: Tiffany Hartman MRN: [id number] DOB/AGE: Oct 30, 1927 83 y.o. Primary [place], [place] Admit date: 04/06/2016 Discharge date: 04/08/2016    Discharge Diagnoses:   Principal Problem:   Chest pain Active Problems:   Parkinsons disease ([place])   Dizziness   Diabetes ([place])   Asthma   Allergies as of 04/08/2016   No Known Allergies     Medication List    TAKE these medications   albuterol 108 (90 Base) MCG/ACT inhaler Commonly known as:  PROVENTIL HFA;VENTOLIN HFA Inhale 1-2 puffs into the lungs every 6 (six) hours as needed for wheezing or shortness of breath.   ALPRAZolam 0.25 MG tablet Commonly known as:  XANAX Take 0.25 mg by mouth 3 (three) times daily.   amLODipine 5 MG tablet Commonly known as:  NORVASC Take 5 mg by mouth daily.   Calcium Carbonate-Vitamin D 600-200 MG-UNIT Tabs Take 1 tablet by mouth every evening.   clopidogrel 75 MG tablet Commonly known as:  PLAVIX Take 1 tablet (75 mg total) by mouth daily with breakfast.   donepezil 10 MG tablet Commonly known as:  ARICEPT Take 10 mg by mouth at bedtime.   ferrous gluconate 325 MG tablet Commonly known as:  FERGON Take 325 mg by mouth daily with breakfast.   FLUoxetine 20 MG capsule Commonly known as:  PROZAC Take 20 mg by mouth daily.   folic acid 1 MG tablet Commonly known as:  FOLVITE Take 1 mg by mouth daily.   furosemide 40 MG tablet Commonly known as:  LASIX Take 40 mg by mouth daily.   gabapentin 300 MG capsule Commonly known as:  NEURONTIN Take 1 capsule (300 mg total) by mouth 3 (three) times daily. What changed:  when to take this   HYDROcodone-acetaminophen 5-325 MG tablet Commonly known as:  NORCO/VICODIN Take 1 tablet by mouth every 6 (six) hours as needed for moderate pain.   ipratropium-albuterol 0.5-2.5 (3) MG/3ML Soln Commonly known as:  DUONEB Take 3 mLs by nebulization every 4 (four) hours. What changed:  when to take  this   latanoprost 0.005 % ophthalmic solution Commonly known as:  XALATAN Place 1 drop into both eyes at bedtime.   levETIRAcetam 250 MG tablet Commonly known as:  KEPPRA Take 250 mg by mouth 2 (two) times daily.   losartan 100 MG tablet Commonly known as:  COZAAR Take 100 mg by mouth daily.   lovastatin 10 MG tablet Commonly known as:  MEVACOR Take 10 mg by mouth at bedtime.   omeprazole 20 MG capsule Commonly known as:  PRILOSEC Take 20 mg by mouth 2 (two) times daily.   potassium chloride SA 20 MEQ tablet Commonly known as:  K-DUR,KLOR-CON Take 20 mEq by mouth 2 (two) times daily.   prednisoLONE acetate 1 % ophthalmic suspension Commonly known as:  PRED FORTE Place 1 drop into the right eye daily.       Discharged Condition: improved    Consults: none  Significant Diagnostic Studies: Dg Chest 2 View  Result Date: 04/06/2016 CLINICAL DATA:  Chest pain and increasing shortness of breast since launch today, remote breast cancer, history diabetes mellitus, hypertension, asthma, Parkinson's EXAM: CHEST  2 VIEW COMPARISON:  03/25/2016 FINDINGS: Enlargement of cardiac silhouette. Aortic atherosclerosis. Mediastinal contours and pulmonary vascularity normal. Lungs clear. No pleural effusion or pneumothorax. Scattered endplate spur formation thoracic spine. Surgical clips LEFT axilla question prior axillary node dissection. BILATERAL glenohumeral degenerative changes more advanced on LEFT. IMPRESSION: No acute  abnormalities. Minimal enlargement of cardiac silhouette. Aortic atherosclerosis. Electronically Signed   By: Lavonia [place] M.D.   On: 04/06/2016 21:00   Dg Chest 2 View  Result Date: 03/25/2016 CLINICAL DATA:  Shortness of Breath for 2 months EXAM: CHEST  2 VIEW COMPARISON:  01/30/2015 FINDINGS: The heart size and mediastinal contours are within normal limits. Both lungs are clear. The visualized skeletal structures show degenerative change of the thoracic spine.  Postsurgical changes are noted consistent with prior left mastectomy. IMPRESSION: No active cardiopulmonary disease. Electronically Signed   By: Inez Catalina M.D.   On: 03/25/2016 15:25    Lab Results: Basic Metabolic Panel:  Recent Labs  04/06/16 2039 04/08/16 0619  NA 143 140  K 4.2 4.6  CL 110 109  CO2 27 27  GLUCOSE 124* 121*  BUN 21* 25*  CREATININE 1.19* 1.25*  CALCIUM 9.0 8.6*   Liver Function Tests: No results for input(s): AST, ALT, ALKPHOS, BILITOT, PROT, ALBUMIN in the last 72 hours.   CBC:  Recent Labs  04/06/16 2039 04/08/16 0619  WBC 6.2 5.4  HGB 10.3* 10.2*  HCT 32.1* 32.7*  MCV 92.2 93.7  PLT 275 259    No results found for this or any previous visit (from the past 240 hour(s)).   Hospital Course:   This is an 83 years old female with history of multiple medical illnesses was admitted due to right side chest pain.  She had serial EKG and and cardiac enzymes which were negative. Patient was treated as chest wall pain. She improved and discharged home instable condition to continue her regular treatment. She will be followed in the office in 2 weeks time.  Discharge Exam: Blood pressure (!) 143/45, pulse 60, temperature 97.6 F (36.4 C), temperature source Oral, resp. rate 18, height 5\' 1"  (1.549 m), weight 103.6 kg (228 lb 6.4 oz), SpO2 100 %.    Disposition:  home    Follow-up Information    Keelyn Fjelstad, MD Follow up in 2 week(s).   Specialty:  Internal Medicine Contact information: [place] [address] [phone]           Signed: Rosita Fire   04/08/2016, 8:25 AM

## 2016-04-08 NOTE — Progress Notes (Signed)
Gave discharge instructions and prescription, verbalized understanding, patient out in wheelchair with oxygen in stable condition with granddaughter.

## 2016-04-22 DIAGNOSIS — I10 Essential (primary) hypertension: Secondary | ICD-10-CM | POA: Diagnosis not present

## 2016-04-22 DIAGNOSIS — R0789 Other chest pain: Secondary | ICD-10-CM | POA: Diagnosis not present

## 2016-04-22 DIAGNOSIS — E1165 Type 2 diabetes mellitus with hyperglycemia: Secondary | ICD-10-CM | POA: Diagnosis not present

## 2016-04-22 DIAGNOSIS — J449 Chronic obstructive pulmonary disease, unspecified: Secondary | ICD-10-CM | POA: Diagnosis not present

## 2016-05-01 DIAGNOSIS — H401133 Primary open-angle glaucoma, bilateral, severe stage: Secondary | ICD-10-CM | POA: Diagnosis not present

## 2016-05-01 DIAGNOSIS — H16223 Keratoconjunctivitis sicca, not specified as Sjogren's, bilateral: Secondary | ICD-10-CM | POA: Diagnosis not present

## 2016-05-01 DIAGNOSIS — H47233 Glaucomatous optic atrophy, bilateral: Secondary | ICD-10-CM | POA: Diagnosis not present

## 2016-05-01 DIAGNOSIS — H18231 Secondary corneal edema, right eye: Secondary | ICD-10-CM | POA: Diagnosis not present

## 2016-05-28 ENCOUNTER — Emergency Department (HOSPITAL_COMMUNITY): Payer: Medicare Other

## 2016-05-28 ENCOUNTER — Emergency Department (HOSPITAL_COMMUNITY)
Admission: EM | Admit: 2016-05-28 | Discharge: 2016-05-28 | Disposition: A | Discharge status: Home / Self Care | Payer: Medicare Other | Attending: Emergency Medicine | Admitting: Emergency Medicine

## 2016-05-28 ENCOUNTER — Encounter (HOSPITAL_COMMUNITY): Payer: Self-pay | Admitting: Emergency Medicine

## 2016-05-28 DIAGNOSIS — I10 Essential (primary) hypertension: Secondary | ICD-10-CM | POA: Diagnosis not present

## 2016-05-28 DIAGNOSIS — Y939 Activity, unspecified: Secondary | ICD-10-CM | POA: Diagnosis not present

## 2016-05-28 DIAGNOSIS — M25571 Pain in right ankle and joints of right foot: Secondary | ICD-10-CM | POA: Insufficient documentation

## 2016-05-28 DIAGNOSIS — J45909 Unspecified asthma, uncomplicated: Secondary | ICD-10-CM | POA: Insufficient documentation

## 2016-05-28 DIAGNOSIS — S4291XA Fracture of right shoulder girdle, part unspecified, initial encounter for closed fracture: Secondary | ICD-10-CM

## 2016-05-28 DIAGNOSIS — E119 Type 2 diabetes mellitus without complications: Secondary | ICD-10-CM | POA: Diagnosis not present

## 2016-05-28 DIAGNOSIS — Y999 Unspecified external cause status: Secondary | ICD-10-CM | POA: Insufficient documentation

## 2016-05-28 DIAGNOSIS — Y929 Unspecified place or not applicable: Secondary | ICD-10-CM | POA: Insufficient documentation

## 2016-05-28 DIAGNOSIS — M545 Low back pain: Secondary | ICD-10-CM | POA: Diagnosis not present

## 2016-05-28 DIAGNOSIS — Z79899 Other long term (current) drug therapy: Secondary | ICD-10-CM | POA: Insufficient documentation

## 2016-05-28 DIAGNOSIS — M25552 Pain in left hip: Secondary | ICD-10-CM | POA: Diagnosis not present

## 2016-05-28 DIAGNOSIS — W010XXA Fall on same level from slipping, tripping and stumbling without subsequent striking against object, initial encounter: Secondary | ICD-10-CM | POA: Insufficient documentation

## 2016-05-28 DIAGNOSIS — M25551 Pain in right hip: Secondary | ICD-10-CM | POA: Insufficient documentation

## 2016-05-28 DIAGNOSIS — Z7902 Long term (current) use of antithrombotics/antiplatelets: Secondary | ICD-10-CM | POA: Insufficient documentation

## 2016-05-28 DIAGNOSIS — S42201A Unspecified fracture of upper end of right humerus, initial encounter for closed fracture: Secondary | ICD-10-CM | POA: Diagnosis not present

## 2016-05-28 DIAGNOSIS — M25559 Pain in unspecified hip: Secondary | ICD-10-CM | POA: Diagnosis not present

## 2016-05-28 DIAGNOSIS — M25511 Pain in right shoulder: Secondary | ICD-10-CM | POA: Diagnosis not present

## 2016-05-28 DIAGNOSIS — S0990XA Unspecified injury of head, initial encounter: Secondary | ICD-10-CM | POA: Diagnosis not present

## 2016-05-28 DIAGNOSIS — G20 Parkinson's disease: Secondary | ICD-10-CM | POA: Diagnosis not present

## 2016-05-28 DIAGNOSIS — W19XXXA Unspecified fall, initial encounter: Secondary | ICD-10-CM

## 2016-05-28 DIAGNOSIS — S4991XA Unspecified injury of right shoulder and upper arm, initial encounter: Secondary | ICD-10-CM | POA: Diagnosis present

## 2016-05-28 DIAGNOSIS — Y92009 Unspecified place in unspecified non-institutional (private) residence as the place of occurrence of the external cause: Secondary | ICD-10-CM

## 2016-05-28 DIAGNOSIS — Z853 Personal history of malignant neoplasm of breast: Secondary | ICD-10-CM | POA: Diagnosis not present

## 2016-05-28 NOTE — ED Provider Notes (Signed)
[facility] DEPT Provider Note   CSN: [id number] Arrival date & time: 05/28/16  1523     History   Chief Complaint Chief Complaint  Patient presents with  . Fall    HPI Tiffany Hartman is a 83 y.o. female.  HPI Patient presents after a fall. States she is on a fluid pill and had to go to the bathroom urgently. States she did not make it and she was getting her mom to mop up when she slipped and fell. Denies hitting her head. Complaining of pain in right ankle bilateral hips lower back right shoulder and a headache. States she did not her head but she is on Plavix. No chest pain. No abdominal pain. No confusion. Past Medical History:  Diagnosis Date  . Asthma   . Breast cancer ([place]) 02/20/83   LT mastectomy  . Bronchitis   . Dementia   . Diabetes mellitus ([place])   . Diverticulitis 2009  . Glaucoma   . HTN (hypertension)   . OA (osteoarthritis)   . Obesity 08/17/2014  . Parkinson disease ([place])   . Stroke ([place])   . Thyroid nodule    Dr Legrand Rams    Patient Active Problem List   Diagnosis Date Noted  . Asthma exacerbation 01/31/2015  . Asthma with status asthmaticus 01/31/2015  . Asthma   . Chest pain 08/17/2014  . Dyspnea 08/17/2014  . Obesity 08/17/2014  . Diabetes ([place]) 08/17/2014  . Dizziness 12/27/2013  . Stroke risk 12/27/2013  . Stroke-like episode ([place]) 12/27/2013  . History of stroke 08/10/2012  . Parkinsons disease ([place]) 08/10/2012  . Left sided numbness 08/10/2012  . Left hemiparesis ([place]) 08/10/2012  . Dysarthria 08/10/2012  . Seizure disorder ([place]) 08/10/2012  . Other and unspecified hyperlipidemia 08/10/2012  . Sciatica neuralgia 06/02/2012  . DDD (degenerative disc disease), lumbar 06/02/2012  . Hip pain 06/02/2012  . Knee pain 06/02/2012  . Pes anserinus bursitis 06/02/2012  . Abdominal pain 09/01/2011  . SCIATICA 10/01/2009  . SHOULDER, ARTHRITIS, DEGEN./OSTEO 11/27/2008  . KNEE PAIN 07/26/2007  . SHOULDER PAIN 12/01/2006  . History of  cardiovascular disorder 12/01/2006    Past Surgical History:  Procedure Laterality Date  .  bilateral catracts    . ABDOMINAL HYSTERECTOMY     complete  . APPENDECTOMY    . EUS  09/10/2011   Procedure: UPPER ENDOSCOPIC ULTRASOUND (EUS) RADIAL;  Surgeon: Arta Silence, MD;  Location: [facility] ENDOSCOPY;  Service: Endoscopy;  Laterality: N/A;  christina/ebp  . FINE NEEDLE ASPIRATION  09/10/2011   Procedure: FINE NEEDLE ASPIRATION (FNA) LINEAR;  Surgeon: Arta Silence, MD;  Location: [facility] ENDOSCOPY;  Service: Endoscopy;  Laterality: N/A;  . FRACTURE SURGERY  2008    right and left femurs  . JOINT REPLACEMENT  1998/1999   knee boths  . KNEE SURGERY Right    TKA  . KNEE SURGERY Left    TKA  . KNEE SURGERY Right    Periprosthetic fracture /otif  . MASTECTOMY  02/20/1983   left     OB History    No data available       Home Medications    Prior to Admission medications   Medication Sig Start Date End Date Taking? Authorizing Provider  albuterol (PROVENTIL HFA;VENTOLIN HFA) 108 (90 Base) MCG/ACT inhaler Inhale 1-2 puffs into the lungs every 6 (six) hours as needed for wheezing or shortness of breath.   Yes [provider]  HYDROcodone-acetaminophen (NORCO/VICODIN) 5-325 MG tablet Take 1 tablet by mouth every  6 (six) hours as needed for moderate pain. 04/08/16  Yes Fanta, Brandon Melnick, MD  ipratropium-albuterol (DUONEB) 0.5-2.5 (3) MG/3ML SOLN Take 3 mLs by nebulization every 4 (four) hours. Patient taking differently: Take 3 mLs by nebulization 4 (four) times daily.  08/22/14  Yes Fanta, Brandon Melnick, MD  latanoprost (XALATAN) 0.005 % ophthalmic solution Place 1 drop into both eyes at bedtime.   Yes [provider]  prednisoLONE acetate (PRED FORTE) 1 % ophthalmic suspension Place 1 drop into the right eye daily.  08/07/15  Yes [provider]  ALPRAZolam (XANAX) 0.25 MG tablet Take 0.25 mg by mouth 3 (three) times daily.     [provider]  amLODipine (NORVASC) 5 MG  tablet Take 5 mg by mouth daily.    [provider]  Calcium Carbonate-Vitamin D (CALCIUM + D) 600-200 MG-UNIT TABS Take 1 tablet by mouth every evening.     [provider]  clopidogrel (PLAVIX) 75 MG tablet Take 1 tablet (75 mg total) by mouth daily with breakfast. 08/16/12   Rosita Fire, MD  donepezil (ARICEPT) 10 MG tablet Take 10 mg by mouth at bedtime.    [provider]  ferrous gluconate (FERGON) 325 MG tablet Take 325 mg by mouth daily with breakfast.    [provider]  FLUoxetine (PROZAC) 20 MG capsule Take 20 mg by mouth daily.    [provider]  folic acid (FOLVITE) 1 MG tablet Take 1 mg by mouth daily.    [provider]  furosemide (LASIX) 40 MG tablet Take 40 mg by mouth daily.     [provider]  gabapentin (NEURONTIN) 300 MG capsule Take 1 capsule (300 mg total) by mouth 3 (three) times daily. Patient taking differently: Take 300 mg by mouth 2 (two) times daily.  06/02/12   Carole Civil, MD  levETIRAcetam (KEPPRA) 250 MG tablet Take 250 mg by mouth 2 (two) times daily.    [provider]  losartan (COZAAR) 100 MG tablet Take 100 mg by mouth daily.    [provider]  lovastatin (MEVACOR) 10 MG tablet Take 10 mg by mouth at bedtime.    [provider]  omeprazole (PRILOSEC) 20 MG capsule Take 20 mg by mouth 2 (two) times daily.     [provider]  potassium chloride SA (K-DUR,KLOR-CON) 20 MEQ tablet Take 20 mEq by mouth 2 (two) times daily.    [provider]    Family History Family History  Problem Relation Age of Onset  . Huntington's disease Mother   . Stroke Father   . Colon polyps Daughter 27  . Throat cancer Son     Social History Social History  Substance Use Topics  . Smoking status: Never Smoker  . Smokeless tobacco: Never Used  . Alcohol use No     Allergies   Patient has no known allergies.   Review of Systems Review of Systems    Constitutional: Negative for appetite change.  HENT: Negative for congestion.   Eyes: Negative for photophobia.  Respiratory: Negative for shortness of breath.   Cardiovascular: Negative for chest pain.  Gastrointestinal: Negative for abdominal pain.  Endocrine: Negative for polyuria.  Genitourinary: Negative for dysuria and hematuria.  Musculoskeletal: Positive for back pain. Negative for neck pain.  Neurological: Positive for headaches.  Hematological: Bruises/bleeds easily.  Psychiatric/Behavioral: Negative for confusion.     Physical Exam Updated Vital Signs BP 138/72   Pulse 61   Temp 98.4 F (36.9 C) (  Oral)   Resp 18   Ht 5' 5.5" (1.664 m)   Wt 102.1 kg (225 lb)   SpO2 100%   BMI 36.87 kg/m   Physical Exam  Constitutional: She appears well-developed.  HENT:  Head: Atraumatic.  Eyes: EOM are normal. Pupils are equal, round, and reactive to light.  Neck: Neck supple.  Cardiovascular: Normal rate.   Pulmonary/Chest: Effort normal.  Abdominal: There is no tenderness.  Musculoskeletal:  Tenderness over right shoulder. Good range of motion. No tenderness over elbow or forearm. No tenderness over left upper extremity. There is mild lumbar tenderness. No deformity. Some mild tenderness in bilateral hips. Good range of motion. Bilateral knees postsurgical. Nontender knees. Mild tenderness to right ankle. Good range of motion. Skin intact although over.  Neurological: She is alert.  Skin: Skin is warm.     ED Treatments / Results  Labs (all labs ordered are listed, but only abnormal results are displayed) Labs Reviewed - No data to display  EKG  EKG Interpretation  Date/Time:  Wednesday May 28 2016 15:36:07 EDT Ventricular Rate:  58 PR Interval:    QRS Duration: 91 QT Interval:  441 QTC Calculation: 434 R Axis:   17 Text Interpretation:  Sinus rhythm Abnormal R-wave progression, early transition Baseline wander in lead(s) V3 Confirmed by Alvino Chapel  MD, Ovid Curd  [phone]) on 05/28/2016 5:50:09 PM       Radiology Dg Lumbar Spine Complete  Result Date: 05/28/2016 CLINICAL DATA:  83 year old female with a history of bilateral hip pain right shoulder pain right ankle pain, lumbar pain. Fall EXAM: LUMBAR SPINE - COMPLETE 4+ VIEW COMPARISON:  CT 09/01/2011 FINDINGS: Lumbar Spine: Lumbar vertebral elements maintain normal alignment without evidence of subluxation. Mild anterolisthesis of L4 on L5, which was present on the comparison CT study. Oblique images demonstrate no evidence of a displaced pars defect. Vertebral body heights relatively maintained. No fracture line identified. Multilevel degenerative disc disease with endplate sclerosis and anterior osteophyte production throughout the lumbar spine. Most advanced changes are at the L3-L4 level. Facet disease worst at L3-L4, L4-L5, L5-S1. Calcifications of the abdominal aorta IMPRESSION: Negative for acute fracture or malalignment of the lumbar spine. Re- demonstration of mild anterolisthesis of L4 on L5. Multilevel degenerative disc disease. Aortic atherosclerosis Electronically Signed   By: Corrie Mckusick D.O.   On: 05/28/2016 16:19   Dg Shoulder Right  Result Date: 05/28/2016 CLINICAL DATA:  Right shoulder pain.  Fall today. EXAM: RIGHT SHOULDER - 2+ VIEW COMPARISON:  12/28/2015 FINDINGS: There is cortical step-off along the lateral aspect of the humeral head articular surface. Small intra-articular or osteochondral fracture cannot be excluded. Advanced degenerative changes of the glenohumeral joint are noted. Clavicle and scapula are grossly intact. No obvious rib fracture. IMPRESSION: Possible osteochondral fracture involving the lateral humeral head articular surface. Electronically Signed   By: Marybelle Killings M.D.   On: 05/28/2016 16:17   Dg Ankle Complete Right  Result Date: 05/28/2016 CLINICAL DATA:  Right ankle pain following fall, initial encounter EXAM: RIGHT ANKLE - COMPLETE 3+ VIEW COMPARISON:  None.  FINDINGS: Mild soft tissue swelling is noted. No acute fracture or dislocation is seen. Calcaneal spurring is noted. Tarsal degenerative changes are noted as well. IMPRESSION: No acute bony abnormality noted. Electronically Signed   By: Inez Catalina M.D.   On: 05/28/2016 16:18   Ct Head Wo Contrast  Result Date: 05/28/2016 CLINICAL DATA:  83 y/o F; status post fall with possible head injury. No loss of consciousness.  EXAM: CT HEAD WITHOUT CONTRAST TECHNIQUE: Contiguous axial images were obtained from the base of the skull through the vertex without intravenous contrast. COMPARISON:  08/10/2012 CT head FINDINGS: Brain: No evidence of acute infarction, hemorrhage, hydrocephalus, extra-axial collection or mass lesion/mass effect. Few stable nonspecific foci of hypoattenuation are present in subcortical and periventricular white matter compatible with mild chronic microvascular ischemic changes. Mild brain parenchymal volume loss. Vascular: Moderate calcific atherosclerosis of carotid siphons. Skull: Normal. Negative for fracture or focal lesion. Sinuses/Orbits: No acute finding. Other: Bilateral intra-ocular lens replacement. IMPRESSION: 1. No acute intracranial abnormality identified. 2. Stable mild chronic microvascular ischemic changes and mild parenchymal volume loss of the brain. Electronically Signed   By: Kristine Garbe M.D.   On: 05/28/2016 17:48   Dg Hips Bilat W Or Wo Pelvis 5 Views  Result Date: 05/28/2016 CLINICAL DATA:  Bilateral hip pain. Symptoms after falling on wet bath for 3 days. Initial encounter. EXAM: DG HIP (WITH OR WITHOUT PELVIS) 5+V BILAT COMPARISON:  None. FINDINGS: Hips are located. There is no acute fracture. The vascular calcifications are noted. More distal right femur plate and screw fixation is partially imaged. Degenerative changes are noted in the lower lumbar spine. IMPRESSION: 1. Mild degenerative changes of the hips bilaterally. 2. No acute abnormality.  Electronically Signed   By: San Morelle M.D.   On: 05/28/2016 16:18    Procedures Procedures (including critical care time)  Medications Ordered in ED Medications - No data to display   Initial Impression / Assessment and Plan / ED Course  I have reviewed the triage vital signs and the nursing notes.  Pertinent labs & imaging results that were available during my care of the patient were reviewed by me and considered in my medical decision making (see chart for details).     Patient with fall. Possible small fracture on the articular surface of the right shoulder. Otherwise imaging reassuring. Will give sling. Has follow-up in around 2 weeks with Dr. Aline Brochure. May follow-up before that. Discharge home.  Final Clinical Impressions(s) / ED Diagnoses   Final diagnoses:  Fall in home, initial encounter  Shoulder fracture, right, closed, initial encounter    New Prescriptions New Prescriptions   No medications on file     Davonna Belling, MD 05/28/16 [phone]

## 2016-05-28 NOTE — ED Notes (Signed)
Pt returned from xray. nad 

## 2016-05-28 NOTE — Discharge Instructions (Signed)
Follow-up with Dr. Aline Brochure. You may have a dent on your shoulder bone.

## 2016-05-28 NOTE — ED Notes (Signed)
Daughter said she forgot pt's 02 tank. I advised for her to get it since she is constantly on 02. Daughter stated pt will ok without it. Pt was mildly sob while transfering to car.

## 2016-05-28 NOTE — ED Notes (Signed)
Pt taken to xray 

## 2016-05-28 NOTE — ED Triage Notes (Addendum)
Per EMS, pt fell while on the way to bathroom. Pt reports BLE pain and right hip pain and right shoulder pain. Pt reports history of right femur fracture x9 years ago and bilateral knee replacement. Pt reports usually uses walker to get around home but reports was not using walker at time of fall this am. Pt alert and oriented.pt reports may of hit head but denies loc.  No obvious deformity noted. Distal pulses weak in RLE.

## 2016-05-28 NOTE — ED Notes (Signed)
Pt taken to ct 

## 2016-06-03 ENCOUNTER — Encounter: Payer: Self-pay | Admitting: Orthopedic Surgery

## 2016-06-03 ENCOUNTER — Ambulatory Visit (INDEPENDENT_AMBULATORY_CARE_PROVIDER_SITE_OTHER): Payer: Medicare Other | Admitting: Orthopedic Surgery

## 2016-06-03 VITALS — BP 143/62 | HR 67 | Temp 97.3°F

## 2016-06-03 DIAGNOSIS — M25511 Pain in right shoulder: Secondary | ICD-10-CM | POA: Diagnosis not present

## 2016-06-03 DIAGNOSIS — M19019 Primary osteoarthritis, unspecified shoulder: Secondary | ICD-10-CM | POA: Diagnosis not present

## 2016-06-03 MED ORDER — HYDROCODONE-ACETAMINOPHEN 5-325 MG PO TABS: 1.0000 | ORAL_TABLET | Freq: Four times a day (QID) | ORAL | 0 refills | Status: DC | PRN

## 2016-06-03 NOTE — Patient Instructions (Signed)
USE MEDICATION AS ORDERED   APPLY HEAT 3 X A DAY   LIMIT OVERHEAD ACTIVITY AND LIFTING WITH PAIN ARM

## 2016-06-03 NOTE — Progress Notes (Signed)
NEW problem OFFICE VISIT    Chief Complaint  Patient presents with  . Shoulder Injury    Rt shoulder injury s/p fall, DOS 05/28/16    83 year old female took a recent fall injured her right and left shoulder complains of increasing right shoulder pain. We had seen her back in 2016 she has chronic rotator cuff disease with arthritis of the right shoulder.  She was on Norco for pain she ran out of it seems to have increased pain since she was off of it.  So in summary she has a new onset bilateral shoulder pain right greater than left 6 days in duration which is severe and worse after not being on hydrocodone she has chronic loss of motion and no worsening in terms of functional abilities with the arms    Review of Systems  Constitutional: Negative for fever.  Musculoskeletal: Positive for joint pain, myalgias and neck pain.  Neurological: Positive for tremors and speech change.     Past Medical History:  Diagnosis Date  . Asthma   . Breast cancer ([place]) 02/20/83   LT mastectomy  . Bronchitis   . Dementia   . Diabetes mellitus ([place])   . Diverticulitis 2009  . Glaucoma   . HTN (hypertension)   . OA (osteoarthritis)   . Obesity 08/17/2014  . Parkinson disease ([place])   . Stroke ([place])   . Thyroid nodule    Dr Legrand Rams    Past Surgical History:  Procedure Laterality Date  .  bilateral catracts    . ABDOMINAL HYSTERECTOMY     complete  . APPENDECTOMY    . EUS  09/10/2011   Procedure: UPPER ENDOSCOPIC ULTRASOUND (EUS) RADIAL;  Surgeon: Arta Silence, MD;  Location: [facility];  Service: Endoscopy;  Laterality: N/A;  christina/ebp  . FINE NEEDLE ASPIRATION  09/10/2011   Procedure: FINE NEEDLE ASPIRATION (FNA) LINEAR;  Surgeon: Arta Silence, MD;  Location: [facility];  Service: Endoscopy;  Laterality: N/A;  . FRACTURE SURGERY  2008    right and left femurs  . JOINT REPLACEMENT  1998/1999   knee boths  . KNEE SURGERY Right    TKA  . KNEE SURGERY Left    TKA  . KNEE  SURGERY Right    Periprosthetic fracture /otif  . MASTECTOMY  02/20/1983   left     Family History  Problem Relation Age of Onset  . Huntington's disease Mother   . Stroke Father   . Colon polyps Daughter 10  . Throat cancer Son    Social History  Substance Use Topics  . Smoking status: Never Smoker  . Smokeless tobacco: Never Used  . Alcohol use No    BP (!) 143/62   Pulse 67   Temp 97.3 F (36.3 C)   Physical Exam Vital signs are reviewed and are stable. Her appearance is mildly obese she is oriented to person place and time her mood and affect are flat and pleasant  Her ambulatory status is supported by a walker and she is on oxygen Ortho Exam Left and right shoulder exam shows decreased external rotation bilaterally painful bilateral abduction limited to 75 and flexion limited to 80 in each shoulder both shoulders feels stable she has weakness in abduction flexion on both rotator cuff neurovascular exam is normal in both shoulders her skin is intact   Meds ordered this encounter  Medications  . HYDROcodone-acetaminophen (NORCO/VICODIN) 5-325 MG tablet    Sig: Take 1 tablet by mouth every 6 (six) hours  as needed for moderate pain.    Dispense:  30 tablet    Refill:  0    Encounter Diagnoses  Name Primary?  . Pain in joint of right shoulder Yes  . Arthritis, shoulder region      PLAN:  Patient will be referred to her primary care doctor or the emergency room to evaluate her ongoing and worsening speech although her CAT scan showed no acute problem on evaluation in the ER  As far as the shoulders going to reorder her hydrocodone she should stay on that until her shoulder pain improved she is not a surgical candidate and does not have a surgical acute lesion she does have chronic arthritis in the right shoulder which would require replacement and possible reverse shoulder replacement in her medical condition does not warrant that at this time.  No follow-up is  necessary but encouraged to pursue the stuttering and speech change.

## 2016-06-06 ENCOUNTER — Ambulatory Visit: Payer: Self-pay | Admitting: Ophthalmology

## 2016-06-06 ENCOUNTER — Encounter (HOSPITAL_COMMUNITY): Payer: Self-pay | Admitting: *Deleted

## 2016-06-06 ENCOUNTER — Ambulatory Visit (HOSPITAL_COMMUNITY)
Admission: RE | Admit: 2016-06-06 | Discharge: 2016-06-06 | Disposition: A | Discharge status: Home / Self Care | Payer: Medicare Other | Source: Ambulatory Visit | Attending: Ophthalmology | Admitting: Ophthalmology

## 2016-06-06 ENCOUNTER — Encounter (HOSPITAL_COMMUNITY)
Admission: RE | Disposition: A | Discharge status: Home / Self Care | Payer: Self-pay | Source: Ambulatory Visit | Attending: Ophthalmology

## 2016-06-06 ENCOUNTER — Ambulatory Visit: Payer: Medicare Other | Admitting: Orthopedic Surgery

## 2016-06-06 ENCOUNTER — Inpatient Hospital Stay (HOSPITAL_COMMUNITY): Payer: Medicare Other | Admitting: Certified Registered Nurse Anesthetist

## 2016-06-06 DIAGNOSIS — Z853 Personal history of malignant neoplasm of breast: Secondary | ICD-10-CM | POA: Insufficient documentation

## 2016-06-06 DIAGNOSIS — Z6836 Body mass index (BMI) 36.0-36.9, adult: Secondary | ICD-10-CM | POA: Insufficient documentation

## 2016-06-06 DIAGNOSIS — Z7902 Long term (current) use of antithrombotics/antiplatelets: Secondary | ICD-10-CM | POA: Diagnosis not present

## 2016-06-06 DIAGNOSIS — Z9981 Dependence on supplemental oxygen: Secondary | ICD-10-CM | POA: Diagnosis not present

## 2016-06-06 DIAGNOSIS — J45909 Unspecified asthma, uncomplicated: Secondary | ICD-10-CM | POA: Insufficient documentation

## 2016-06-06 DIAGNOSIS — H1789 Other corneal scars and opacities: Secondary | ICD-10-CM | POA: Diagnosis not present

## 2016-06-06 DIAGNOSIS — E039 Hypothyroidism, unspecified: Secondary | ICD-10-CM | POA: Insufficient documentation

## 2016-06-06 DIAGNOSIS — E669 Obesity, unspecified: Secondary | ICD-10-CM | POA: Diagnosis not present

## 2016-06-06 DIAGNOSIS — H16441 Deep vascularization of cornea, right eye: Secondary | ICD-10-CM | POA: Diagnosis not present

## 2016-06-06 DIAGNOSIS — H53131 Sudden visual loss, right eye: Secondary | ICD-10-CM | POA: Diagnosis not present

## 2016-06-06 DIAGNOSIS — I10 Essential (primary) hypertension: Secondary | ICD-10-CM | POA: Diagnosis not present

## 2016-06-06 DIAGNOSIS — E119 Type 2 diabetes mellitus without complications: Secondary | ICD-10-CM | POA: Diagnosis not present

## 2016-06-06 DIAGNOSIS — H5711 Ocular pain, right eye: Secondary | ICD-10-CM | POA: Diagnosis not present

## 2016-06-06 DIAGNOSIS — D759 Disease of blood and blood-forming organs, unspecified: Secondary | ICD-10-CM | POA: Diagnosis not present

## 2016-06-06 DIAGNOSIS — H44001 Unspecified purulent endophthalmitis, right eye: Secondary | ICD-10-CM | POA: Insufficient documentation

## 2016-06-06 DIAGNOSIS — H401132 Primary open-angle glaucoma, bilateral, moderate stage: Secondary | ICD-10-CM | POA: Diagnosis not present

## 2016-06-06 DIAGNOSIS — F0280 Dementia in other diseases classified elsewhere without behavioral disturbance: Secondary | ICD-10-CM | POA: Diagnosis not present

## 2016-06-06 DIAGNOSIS — Z79899 Other long term (current) drug therapy: Secondary | ICD-10-CM | POA: Insufficient documentation

## 2016-06-06 DIAGNOSIS — M1990 Unspecified osteoarthritis, unspecified site: Secondary | ICD-10-CM | POA: Insufficient documentation

## 2016-06-06 DIAGNOSIS — H16041 Marginal corneal ulcer, right eye: Secondary | ICD-10-CM | POA: Diagnosis not present

## 2016-06-06 DIAGNOSIS — K219 Gastro-esophageal reflux disease without esophagitis: Secondary | ICD-10-CM | POA: Diagnosis not present

## 2016-06-06 DIAGNOSIS — G20 Parkinson's disease: Secondary | ICD-10-CM | POA: Diagnosis not present

## 2016-06-06 DIAGNOSIS — H4419 Other endophthalmitis: Secondary | ICD-10-CM | POA: Diagnosis not present

## 2016-06-06 DIAGNOSIS — H43391 Other vitreous opacities, right eye: Secondary | ICD-10-CM | POA: Diagnosis not present

## 2016-06-06 HISTORY — PX: PARS PLANA VITRECTOMY: SHX2166

## 2016-06-06 LAB — CBC
HEMATOCRIT: 32.2 % — AB (ref 36.0–46.0)
HEMOGLOBIN: 10 g/dL — AB (ref 12.0–15.0)
MCH: 29 pg (ref 26.0–34.0)
MCHC: 31.1 g/dL (ref 30.0–36.0)
MCV: 93.3 fL (ref 78.0–100.0)
Platelets: 81 10*3/uL — ABNORMAL LOW (ref 150–400)
RBC: 3.45 MIL/uL — ABNORMAL LOW (ref 3.87–5.11)
RDW: 13.4 % (ref 11.5–15.5)
WBC: 5.7 10*3/uL (ref 4.0–10.5)

## 2016-06-06 LAB — BASIC METABOLIC PANEL
Anion gap: 8 (ref 5–15)
BUN: 14 mg/dL (ref 6–20)
CHLORIDE: 107 mmol/L (ref 101–111)
CO2: 28 mmol/L (ref 22–32)
CREATININE: 1.12 mg/dL — AB (ref 0.44–1.00)
Calcium: 9.3 mg/dL (ref 8.9–10.3)
GFR calc non Af Amer: 43 mL/min — ABNORMAL LOW (ref >60)
GFR, EST AFRICAN AMERICAN: 50 mL/min — AB (ref >60)
Glucose, Bld: 107 mg/dL — ABNORMAL HIGH (ref 65–99)
Potassium: 4.3 mmol/L (ref 3.5–5.1)
Sodium: 143 mmol/L (ref 135–145)

## 2016-06-06 LAB — GLUCOSE, CAPILLARY: Glucose-Capillary: 91 mg/dL (ref 65–99)

## 2016-06-06 SURGERY — PARS PLANA VITRECTOMY 25 GAUGE FOR ENDOPHTHALMITIS
Anesthesia: Monitor Anesthesia Care | Site: Eye | Laterality: Right

## 2016-06-06 MED ORDER — GATIFLOXACIN 0.5 % OP SOLN
1.0000 [drp] | OPHTHALMIC | Status: AC | PRN
  Administered 2016-06-06 (×3): 1 [drp] via OPHTHALMIC
  Filled 2016-06-06: qty 2.5

## 2016-06-06 MED ORDER — DEXAMETHASONE SODIUM PHOSPHATE 10 MG/ML IJ SOLN
INTRAMUSCULAR | Status: DC | PRN
  Administered 2016-06-06: 10 mg

## 2016-06-06 MED ORDER — BSS IO SOLN
INTRAOCULAR | Status: DC | PRN
  Administered 2016-06-06: 15 mL via INTRAOCULAR

## 2016-06-06 MED ORDER — TETRACAINE HCL 0.5 % OP SOLN
OPHTHALMIC | Status: DC | PRN
Start: 2016-06-06 — End: 2016-06-06
  Administered 2016-06-06: 2 [drp] via OPHTHALMIC

## 2016-06-06 MED ORDER — CEFAZOLIN SUBCONJUNCTIVAL INJECTION 100 MG/0.5 ML
200.0000 mg | INJECTION | SUBCONJUNCTIVAL | Status: DC
  Filled 2016-06-06 (×2): qty 5

## 2016-06-06 MED ORDER — LIDOCAINE 2% (20 MG/ML) 5 ML SYRINGE
INTRAMUSCULAR | Status: DC | PRN
  Administered 2016-06-06: 60 mg via INTRAVENOUS

## 2016-06-06 MED ORDER — CEFTAZIDIME 1 G IJ SOLR
INTRAMUSCULAR | Status: AC
  Filled 2016-06-06: qty 1

## 2016-06-06 MED ORDER — FENTANYL CITRATE (PF) 100 MCG/2ML IJ SOLN
INTRAMUSCULAR | Status: AC
  Filled 2016-06-06: qty 2

## 2016-06-06 MED ORDER — BSS PLUS IO SOLN
INTRAOCULAR | Status: AC
  Filled 2016-06-06: qty 500

## 2016-06-06 MED ORDER — VANCOMYCIN SUBCONJUNCTIVAL INJECTION 25 MG/0.5 ML
INTRAOCULAR | Status: DC | PRN
  Administered 2016-06-06: 5 mg via SUBCONJUNCTIVAL

## 2016-06-06 MED ORDER — ONDANSETRON HCL 4 MG/2ML IJ SOLN: 4.0000 mg | Freq: Once | INTRAMUSCULAR | Status: DC | PRN

## 2016-06-06 MED ORDER — VANCOMYCIN INTRAVITREAL INJECTION 1 MG/0.1 ML
5.0000 mg | INTRAOCULAR | Status: DC
  Filled 2016-06-06: qty 0.5

## 2016-06-06 MED ORDER — LIDOCAINE HCL 2 % IJ SOLN
INTRAMUSCULAR | Status: DC | PRN
  Administered 2016-06-06: 5 mL via RETROBULBAR

## 2016-06-06 MED ORDER — CEFAZOLIN SUBCONJUNCTIVAL INJECTION 100 MG/0.5 ML
INJECTION | SUBCONJUNCTIVAL | Status: DC | PRN
  Administered 2016-06-06: 200 mg via SUBCONJUNCTIVAL

## 2016-06-06 MED ORDER — FENTANYL CITRATE (PF) 100 MCG/2ML IJ SOLN
25.0000 ug | INTRAMUSCULAR | Status: DC | PRN
  Administered 2016-06-06: 25 ug via INTRAVENOUS

## 2016-06-06 MED ORDER — FENTANYL CITRATE (PF) 250 MCG/5ML IJ SOLN: INTRAMUSCULAR | Status: DC | PRN

## 2016-06-06 MED ORDER — HYPROMELLOSE (GONIOSCOPIC) 2.5 % OP SOLN
OPHTHALMIC | Status: AC
  Filled 2016-06-06: qty 15

## 2016-06-06 MED ORDER — SODIUM CHLORIDE 0.9 % IV SOLN
INTRAVENOUS | Status: DC
  Administered 2016-06-06: 18:00:00 via INTRAVENOUS

## 2016-06-06 MED ORDER — TOBRAMYCIN-DEXAMETHASONE 0.3-0.1 % OP OINT
TOPICAL_OINTMENT | OPHTHALMIC | Status: DC | PRN
  Administered 2016-06-06: 1 via OPHTHALMIC

## 2016-06-06 MED ORDER — LIDOCAINE 2% (20 MG/ML) 5 ML SYRINGE
INTRAMUSCULAR | Status: AC
  Filled 2016-06-06: qty 5

## 2016-06-06 MED ORDER — HYPROMELLOSE (GONIOSCOPIC) 2.5 % OP SOLN
OPHTHALMIC | Status: DC | PRN
  Administered 2016-06-06: 1 [drp] via OPHTHALMIC

## 2016-06-06 MED ORDER — EPINEPHRINE PF 1 MG/ML IJ SOLN
INTRAOCULAR | Status: DC | PRN
  Administered 2016-06-06: .3 mL

## 2016-06-06 MED ORDER — FENTANYL CITRATE (PF) 100 MCG/2ML IJ SOLN
INTRAMUSCULAR | Status: DC | PRN
  Administered 2016-06-06 (×4): 25 ug via INTRAVENOUS

## 2016-06-06 MED ORDER — PROPOFOL 10 MG/ML IV BOLUS
INTRAVENOUS | Status: DC | PRN
Start: 2016-06-06 — End: 2016-06-06
  Administered 2016-06-06: 50 mg via INTRAVENOUS

## 2016-06-06 MED ORDER — LIDOCAINE HCL 2 % IJ SOLN
INTRAMUSCULAR | Status: AC
  Filled 2016-06-06: qty 20

## 2016-06-06 MED ORDER — CEFTAZIDIME INTRAVITREAL INJECTION 2.25 MG/0.1 ML
INTRAVITREAL | Status: DC | PRN
  Administered 2016-06-06: 2.25 mg via INTRAVITREAL

## 2016-06-06 MED ORDER — FENTANYL CITRATE (PF) 250 MCG/5ML IJ SOLN
INTRAMUSCULAR | Status: AC
  Filled 2016-06-06: qty 5

## 2016-06-06 MED ORDER — BSS IO SOLN
INTRAOCULAR | Status: AC
  Filled 2016-06-06: qty 15

## 2016-06-06 MED ORDER — BUPIVACAINE HCL (PF) 0.75 % IJ SOLN
INTRAMUSCULAR | Status: AC
  Filled 2016-06-06: qty 10

## 2016-06-06 MED ORDER — HYALURONIDASE HUMAN 150 UNIT/ML IJ SOLN
INTRAMUSCULAR | Status: AC
  Filled 2016-06-06: qty 1

## 2016-06-06 MED ORDER — DEXAMETHASONE SODIUM PHOSPHATE 10 MG/ML IJ SOLN
INTRAMUSCULAR | Status: AC
  Filled 2016-06-06: qty 1

## 2016-06-06 MED ORDER — ALBUTEROL SULFATE HFA 108 (90 BASE) MCG/ACT IN AERS
INHALATION_SPRAY | RESPIRATORY_TRACT | Status: AC
  Filled 2016-06-06: qty 6.7

## 2016-06-06 MED ORDER — ATROPINE SULFATE 1 % OP SOLN
OPHTHALMIC | Status: AC
  Filled 2016-06-06: qty 5

## 2016-06-06 MED ORDER — EPINEPHRINE PF 1 MG/ML IJ SOLN
INTRAMUSCULAR | Status: AC
  Filled 2016-06-06: qty 1

## 2016-06-06 MED ORDER — TOBRAMYCIN-DEXAMETHASONE 0.3-0.1 % OP OINT
TOPICAL_OINTMENT | OPHTHALMIC | Status: AC
  Filled 2016-06-06: qty 3.5

## 2016-06-06 MED ORDER — SODIUM CHLORIDE 0.9 % IV SOLN
INTRAVENOUS | Status: DC | PRN
  Administered 2016-06-06: 19:00:00 via INTRAVENOUS

## 2016-06-06 MED ORDER — CEFTAZIDIME INTRAVITREAL INJECTION 2.25 MG/0.1 ML
2.2500 mg | INTRAVITREAL | Status: DC
  Filled 2016-06-06: qty 0.1

## 2016-06-06 MED ORDER — TETRACAINE HCL 0.5 % OP SOLN
OPHTHALMIC | Status: AC
  Filled 2016-06-06: qty 2

## 2016-06-06 MED ORDER — BSS PLUS IO SOLN
INTRAOCULAR | Status: DC | PRN
  Administered 2016-06-06: 1 via INTRAOCULAR

## 2016-06-06 MED ORDER — LIDOCAINE HCL 2 % IJ SOLN
INTRAMUSCULAR | Status: DC | PRN
  Administered 2016-06-06: 3 mL

## 2016-06-06 MED ORDER — ALBUTEROL SULFATE HFA 108 (90 BASE) MCG/ACT IN AERS
INHALATION_SPRAY | RESPIRATORY_TRACT | Status: DC | PRN
  Administered 2016-06-06: 2 via RESPIRATORY_TRACT

## 2016-06-06 SURGICAL SUPPLY — 53 items
APPLICATOR COTTON TIP 6IN STRL (MISCELLANEOUS) ×3 IMPLANT
BLADE MVR KNIFE 20G (BLADE) IMPLANT
CANNULA ANT CHAM MAIN (OPHTHALMIC RELATED) IMPLANT
CANNULA DUAL BORE 23G (CANNULA) IMPLANT
CANNULA DUALBORE 25G (CANNULA) IMPLANT
CANNULA VLV SOFT TIP 25GA (OPHTHALMIC) ×3 IMPLANT
CAUTERY EYE LOW TEMP 1300F FIN (OPHTHALMIC RELATED) IMPLANT
CLOSURE STERI-STRIP 1/2X4 (GAUZE/BANDAGES/DRESSINGS) ×1
CLSR STERI-STRIP ANTIMIC 1/2X4 (GAUZE/BANDAGES/DRESSINGS) ×2 IMPLANT
CORDS BIPOLAR (ELECTRODE) IMPLANT
COVER MAYO STAND STRL (DRAPES) IMPLANT
DRAPE HALF SHEET 40X57 (DRAPES) ×3 IMPLANT
DRAPE INCISE 51X51 W/FILM STRL (DRAPES) IMPLANT
DRAPE RETRACTOR (MISCELLANEOUS) ×3 IMPLANT
ERASER HMR WETFIELD 23G BP (MISCELLANEOUS) IMPLANT
FORCEPS ECKARDT ILM 25G SERR (OPHTHALMIC RELATED) IMPLANT
FORCEPS GRIESHABER ILM 25G A (INSTRUMENTS) IMPLANT
GAS AUTO FILL CONSTEL (OPHTHALMIC)
GAS AUTO FILL CONSTELLATION (OPHTHALMIC) IMPLANT
GLOVE BIO SURGEON STRL SZ7.5 (GLOVE) ×3 IMPLANT
GLOVE ECLIPSE 7.5 STRL STRAW (GLOVE) ×3 IMPLANT
GOWN STRL REUS W/ TWL LRG LVL3 (GOWN DISPOSABLE) ×1 IMPLANT
GOWN STRL REUS W/TWL LRG LVL3 (GOWN DISPOSABLE) ×2
HANDLE PNEUMATIC FOR CONSTEL (OPHTHALMIC) IMPLANT
KIT BASIN OR (CUSTOM PROCEDURE TRAY) ×3 IMPLANT
KIT ROOM TURNOVER OR (KITS) ×3 IMPLANT
LENS BIOM SUPER VIEW SET DISP (OPHTHALMIC RELATED) ×3 IMPLANT
MICROPICK 25G (MISCELLANEOUS)
NEEDLE 18GX1X1/2 (RX/OR ONLY) (NEEDLE) ×3 IMPLANT
NEEDLE 25GX 5/8IN NON SAFETY (NEEDLE) ×3 IMPLANT
NEEDLE FILTER BLUNT 18X 1/2SAF (NEEDLE) ×6
NEEDLE FILTER BLUNT 18X1 1/2 (NEEDLE) ×3 IMPLANT
NEEDLE HYPO 25GX1X1/2 BEV (NEEDLE) IMPLANT
NEEDLE HYPO 30X.5 LL (NEEDLE) ×12 IMPLANT
NEEDLE RETROBULBAR 25GX1.5 (NEEDLE) IMPLANT
NS IRRIG 1000ML POUR BTL (IV SOLUTION) ×3 IMPLANT
PACK VITRECTOMY CUSTOM (CUSTOM PROCEDURE TRAY) ×3 IMPLANT
PAD ARMBOARD 7.5X6 YLW CONV (MISCELLANEOUS) ×6 IMPLANT
PAK PIK VITRECTOMY CVS 25GA (OPHTHALMIC) ×3 IMPLANT
PENCIL BIPOLAR 25GA STR DISP (OPHTHALMIC RELATED) IMPLANT
PICK MICROPICK 25G (MISCELLANEOUS) IMPLANT
PROBE LASER ILLUM FLEX CVD 25G (OPHTHALMIC) IMPLANT
ROLLS DENTAL (MISCELLANEOUS) IMPLANT
SCRAPER DIAMOND 25GA (OPHTHALMIC RELATED) IMPLANT
STOPCOCK 4 WAY LG BORE MALE ST (IV SETS) IMPLANT
SUT VICRYL 7 0 TG140 8 (SUTURE) ×3 IMPLANT
SYR 10ML LL (SYRINGE) IMPLANT
SYR 20CC LL (SYRINGE) ×3 IMPLANT
SYR 5ML LL (SYRINGE) ×3 IMPLANT
SYR TB 1ML LUER SLIP (SYRINGE) ×9 IMPLANT
TOWEL OR 17X24 6PK STRL BLUE (TOWEL DISPOSABLE) ×6 IMPLANT
WATER STERILE IRR 1000ML POUR (IV SOLUTION) ×3 IMPLANT
WIPE INSTRUMENT VISIWIPE 73X73 (MISCELLANEOUS) IMPLANT

## 2016-06-06 NOTE — Anesthesia Postprocedure Evaluation (Signed)
Anesthesia Post Note  Patient: Tiffany Hartman  Procedure(s) Performed: Procedure(s) (LRB): VITRECTOMY WITH CULTURES AND INJECTION OF ANTIBIOTICS; ANTERIOR CHAMBER WASHOUT (Right)     Anesthesia Post Evaluation  Last Vitals:  Vitals:   06/06/16 2120 06/06/16 2130  BP: (!) 136/56 (!) 148/51  Pulse: 76 61  Resp: 20 18  Temp:  36.8 C    Last Pain:  Vitals:   06/06/16 2130  TempSrc:   PainSc: 4                  Aboubacar Matsuo

## 2016-06-06 NOTE — Brief Op Note (Signed)
06/06/2016  8:46 PM  PATIENT:  Tiffany Hartman  83 y.o. female  PRE-OPERATIVE DIAGNOSIS: Endopthalmitis right eye with blebitis  POST-OPERATIVE DIAGNOSIS:  Endopthalmitis right eye with blebitis  PROCEDURE:  Procedure(s): VITRECTOMY WITH CULTURES AND INJECTION OF ANTIBIOTICS; ANTERIOR CHAMBER WASHOUT (Right)  SURGEON:  Surgeon(s) and Role:    * Jalene Mullet, MD - Primary  PHYSICIAN ASSISTANT:   ASSISTANTS: none   ANESTHESIA:   local and MAC  EBL:  Total I/O In: -  Out: 5 [Blood:5]  BLOOD ADMINISTERED:none  DRAINS: none   LOCAL MEDICATIONS USED:  MARCAINE    and LIDOCAINE   SPECIMEN:  Source of Specimen:  Vitreous  DISPOSITION OF SPECIMEN:  Microbiology - culture and gram stain  COUNTS:  YES  TOURNIQUET:  * No tourniquets in log *  DICTATION: .Note written in EPIC  PLAN OF CARE: Discharge to home after PACU  PATIENT DISPOSITION:  PACU - hemodynamically stable.   Delay start of Pharmacological VTE agent (>24hrs) due to surgical blood loss or risk of bleeding: not applicable

## 2016-06-06 NOTE — Op Note (Signed)
Tiffany Hartman 06/06/2016 Diagnosis: Endophthalmitis right eye Procedure:  Pars plana vitrectomy, anterior chamber washout, aspiration for culture/gm stain, injection of intravitreal antibiotics, corneal culture Operative Eye:  right eye  Surgeon: Royston Cowper Estimated Blood Loss: minimal Specimens for Pathology:  None Complications: none  The  patient was prepped and draped in the usual fashion for ocular surgery on the  right eye .  A lid speculum was placed.  A clear corneal wound was placed at 10 o'clock, the anterior chamber was washed out was performed.  10-0 nylon was used to close the clear corneal wound and buried.  Infusion line and trocar was placed at the 4 o'clock position approximately 3.5 mm from the surgical limbus.  Active trocar/cannula was placed at the 8 o'clockapproximately 3.5 mm from the surgical limbus. A core vitrectomy was performed after a dry tap of the posterior chamber was performed for gram stain and culture.  There was a very difficult view of the posterior pole and care was taken to perform a careful core vitrectomy.   The trocars were sequentially removed and sclerotomies closed with 8-0 vicryl along with the overlying conjunctiva.  1mg  Vancomycin and 2.25 mg Ceftazadime were placed in the posterior chamber after decreasing the intraocular pressure with a 30 gauge needle.  Intraocular pressure was normal by palpation after injection of intravitreal antibiotics.     All wounds were noted to be sealed.    Subconjunctival injections of  Dexamethasone 4mg /59ml, Ceftazadime 2.25mg /ml and Vancomycin 1mg /ml were placed in the infero-medial quadrant.   The speculum and drapes were removed and the eye was patched with Polymixin/Bacitracin ophthalmic ointment. An eye shield was placed and the patient was transferred alert and conversant with stable vital signs to the post operative recovery area.  The patient tolerated the procedure well and no complications were  noted.  Royston Cowper MD

## 2016-06-06 NOTE — Discharge Instructions (Signed)
Keep patch in place 

## 2016-06-06 NOTE — H&P (Signed)
  Date of examination:  06/06/16  Indication for surgery: endophthalmitis right eye  Pertinent past medical history:  Past Medical History:  Diagnosis Date  . Asthma   . Breast cancer ([place]) 02/20/83   LT mastectomy  . Bronchitis   . Dementia   . Diabetes mellitus ([place])   . Diverticulitis 2009  . Glaucoma   . HTN (hypertension)   . OA (osteoarthritis)   . Obesity 08/17/2014  . Parkinson disease ([place])   . Stroke ([place])   . Thyroid nodule    Dr Legrand Rams    Pertinent ocular history:  Endophthalmitis right eye with blebitis  Pertinent family history:  Family History  Problem Relation Age of Onset  . Huntington's disease Mother   . Stroke Father   . Colon polyps Daughter 19  . Throat cancer Son     General:  Healthy appearing patient in no distress.   Eyes:    Acuity OD CF  [place]  External: Within normal limits   Anterior segment: Within normal limits     Fundus: No view OD  l sounds     Impression: Endophthalmitis right eye  Plan: Tap and inject with culture, consider vitrectomy   Royston Cowper

## 2016-06-06 NOTE — Transfer of Care (Signed)
Immediate Anesthesia Transfer of Care Note  Patient: Tiffany Hartman  Procedure(s) Performed: Procedure(s): VITRECTOMY WITH CULTURES AND INJECTION OF ANTIBIOTICS; ANTERIOR CHAMBER WASHOUT (Right)  Patient Location: PACU  Anesthesia Type:MAC  Level of Consciousness: awake, alert  and oriented  Airway & Oxygen Therapy: Patient connected to nasal cannula oxygen  Post-op Assessment: Report given to RN and Post -op Vital signs reviewed and stable  Post vital signs: Reviewed and stable  Last Vitals:  Vitals:   06/06/16 1757  BP: (!) 155/46  Pulse: 62  Resp: (!) 24  Temp: 37.2 C    Last Pain:  Vitals:   06/06/16 1757  TempSrc: Oral  PainSc: 8          Complications: No apparent anesthesia complications

## 2016-06-06 NOTE — Anesthesia Preprocedure Evaluation (Addendum)
Anesthesia Evaluation  Patient identified by MRN, date of birth, ID band Patient awake    Reviewed: Allergy & Precautions, NPO status , Patient's Chart, lab work & pertinent test results  Airway Mallampati: II  TM Distance: >3 FB Neck ROM: Full    Dental  (+) Dental Advisory Given, Edentulous Upper, Edentulous Lower   Pulmonary shortness of breath, at rest and Long-Term Oxygen Therapy, asthma ,     + decreased breath sounds+ wheezing      Cardiovascular hypertension, Pt. on medications Normal cardiovascular exam Rhythm:Regular Rate:Normal  Echo 04/02/26: Study Conclusions  - Left ventricle: The cavity size was normal. Wall thickness was increased in a pattern of moderate LVH. Systolic function was normal. The estimated ejection fraction was in the range of 60% to 65%. Wall motion was normal; there were no regional wall motion abnormalities. Doppler parameters are consistent with abnormal left ventricular relaxation (grade 1 diastolic dysfunction). - Aortic valve: Mildly calcified annulus. Trileaflet; normal   thickness leaflets. Valve area (VTI): 2.04 cm^2. Valve area (Vmax): 1.64 cm^2. - Left atrium: The atrium was mildly to moderately dilated. - Technically adequate study.   Neuro/Psych Dementia Parkinson's disease  Neuromuscular disease CVA    GI/Hepatic Neg liver ROS, GERD  Medicated,  Endo/Other  diabetes, Well Controlled, Type 2Hypothyroidism Obesity   Renal/GU negative Renal ROS     Musculoskeletal  (+) Arthritis , Osteoarthritis,    Abdominal   Peds  Hematology  (+) Blood dyscrasia (Plavix), ,   Anesthesia Other Findings Day of surgery medications reviewed with the patient.  -Right eye infection  -Breast cancer s/p left mastectomy  Reproductive/Obstetrics                            Anesthesia Physical Anesthesia Plan  ASA: III and emergent  Anesthesia Plan: MAC   Post-op  Pain Management:    Induction: Intravenous  Airway Management Planned: Nasal Cannula  Additional Equipment:   Intra-op Plan:   Post-operative Plan:   Informed Consent: I have reviewed the patients History and Physical, chart, labs and discussed the procedure including the risks, benefits and alternatives for the proposed anesthesia with the patient or authorized representative who has indicated his/her understanding and acceptance.   Dental advisory given  Plan Discussed with: CRNA and Anesthesiologist  Anesthesia Plan Comments: (NPO since 1530.  Patient is pulmonary cripple. Discussed risks of MAC including aspiration vs risk of GETA and prolonged intubation post-operatively due to pulmonary status.  All patient questions answered.)        Anesthesia Quick Evaluation

## 2016-06-07 ENCOUNTER — Encounter (HOSPITAL_COMMUNITY): Payer: Self-pay | Admitting: Ophthalmology

## 2016-06-09 LAB — EYE CULTURE
CULTURE: NO GROWTH
Culture: NO GROWTH
GRAM STAIN: NONE SEEN
Gram Stain: NONE SEEN

## 2016-06-10 ENCOUNTER — Ambulatory Visit: Payer: Medicare Other | Admitting: Orthopedic Surgery

## 2016-06-12 LAB — ANAEROBIC CULTURE

## 2016-06-30 LAB — FUNGAL ORGANISM REFLEX

## 2016-06-30 LAB — FUNGUS CULTURE WITH STAIN

## 2016-06-30 LAB — FUNGUS CULTURE RESULT

## 2016-07-29 ENCOUNTER — Other Ambulatory Visit: Payer: Self-pay

## 2016-08-11 DIAGNOSIS — H1789 Other corneal scars and opacities: Secondary | ICD-10-CM | POA: Diagnosis not present

## 2016-08-11 DIAGNOSIS — H5711 Ocular pain, right eye: Secondary | ICD-10-CM | POA: Diagnosis not present

## 2016-08-11 DIAGNOSIS — H5461 Unqualified visual loss, right eye, normal vision left eye: Secondary | ICD-10-CM | POA: Diagnosis not present

## 2016-08-11 DIAGNOSIS — H16441 Deep vascularization of cornea, right eye: Secondary | ICD-10-CM | POA: Diagnosis not present

## 2016-08-20 DIAGNOSIS — H401133 Primary open-angle glaucoma, bilateral, severe stage: Secondary | ICD-10-CM | POA: Diagnosis not present

## 2016-09-10 DIAGNOSIS — H47233 Glaucomatous optic atrophy, bilateral: Secondary | ICD-10-CM | POA: Diagnosis not present

## 2016-09-10 DIAGNOSIS — H401133 Primary open-angle glaucoma, bilateral, severe stage: Secondary | ICD-10-CM | POA: Diagnosis not present

## 2016-10-02 ENCOUNTER — Other Ambulatory Visit (HOSPITAL_COMMUNITY): Payer: Self-pay | Admitting: Internal Medicine

## 2016-10-02 ENCOUNTER — Ambulatory Visit (HOSPITAL_COMMUNITY)
Admission: RE | Admit: 2016-10-02 | Discharge: 2016-10-02 | Disposition: A | Discharge status: Home / Self Care | Payer: Medicare Other | Source: Ambulatory Visit | Attending: Internal Medicine | Admitting: Internal Medicine

## 2016-10-02 DIAGNOSIS — M7989 Other specified soft tissue disorders: Secondary | ICD-10-CM | POA: Insufficient documentation

## 2016-10-02 DIAGNOSIS — I824Z2 Acute embolism and thrombosis of unspecified deep veins of left distal lower extremity: Secondary | ICD-10-CM

## 2016-10-02 DIAGNOSIS — R600 Localized edema: Secondary | ICD-10-CM | POA: Diagnosis not present

## 2016-10-02 DIAGNOSIS — E041 Nontoxic single thyroid nodule: Secondary | ICD-10-CM | POA: Diagnosis not present

## 2016-10-02 DIAGNOSIS — Z9981 Dependence on supplemental oxygen: Secondary | ICD-10-CM | POA: Diagnosis not present

## 2016-10-02 DIAGNOSIS — E784 Other hyperlipidemia: Secondary | ICD-10-CM | POA: Diagnosis not present

## 2016-10-02 DIAGNOSIS — E1165 Type 2 diabetes mellitus with hyperglycemia: Secondary | ICD-10-CM | POA: Diagnosis not present

## 2016-10-02 DIAGNOSIS — I10 Essential (primary) hypertension: Secondary | ICD-10-CM | POA: Diagnosis not present

## 2016-10-02 DIAGNOSIS — J449 Chronic obstructive pulmonary disease, unspecified: Secondary | ICD-10-CM | POA: Diagnosis not present

## 2016-10-02 DIAGNOSIS — Z23 Encounter for immunization: Secondary | ICD-10-CM | POA: Diagnosis not present

## 2016-10-02 DIAGNOSIS — M79605 Pain in left leg: Secondary | ICD-10-CM | POA: Insufficient documentation

## 2016-10-02 DIAGNOSIS — G459 Transient cerebral ischemic attack, unspecified: Secondary | ICD-10-CM | POA: Diagnosis not present

## 2016-10-02 DIAGNOSIS — F329 Major depressive disorder, single episode, unspecified: Secondary | ICD-10-CM | POA: Diagnosis not present

## 2016-10-02 DIAGNOSIS — F0390 Unspecified dementia without behavioral disturbance: Secondary | ICD-10-CM | POA: Diagnosis not present

## 2016-10-03 DIAGNOSIS — E1165 Type 2 diabetes mellitus with hyperglycemia: Secondary | ICD-10-CM | POA: Diagnosis not present

## 2016-11-05 DIAGNOSIS — H401133 Primary open-angle glaucoma, bilateral, severe stage: Secondary | ICD-10-CM | POA: Diagnosis not present

## 2016-11-05 DIAGNOSIS — H18231 Secondary corneal edema, right eye: Secondary | ICD-10-CM | POA: Diagnosis not present

## 2016-12-05 DIAGNOSIS — H401133 Primary open-angle glaucoma, bilateral, severe stage: Secondary | ICD-10-CM | POA: Diagnosis not present

## 2016-12-05 DIAGNOSIS — H18231 Secondary corneal edema, right eye: Secondary | ICD-10-CM | POA: Diagnosis not present

## 2017-01-01 DIAGNOSIS — I10 Essential (primary) hypertension: Secondary | ICD-10-CM | POA: Diagnosis not present

## 2017-01-01 DIAGNOSIS — J449 Chronic obstructive pulmonary disease, unspecified: Secondary | ICD-10-CM | POA: Diagnosis not present

## 2017-01-01 DIAGNOSIS — E1165 Type 2 diabetes mellitus with hyperglycemia: Secondary | ICD-10-CM | POA: Diagnosis not present

## 2017-01-01 DIAGNOSIS — Z9981 Dependence on supplemental oxygen: Secondary | ICD-10-CM | POA: Diagnosis not present

## 2017-01-09 ENCOUNTER — Ambulatory Visit (INDEPENDENT_AMBULATORY_CARE_PROVIDER_SITE_OTHER): Payer: Medicare Other | Admitting: Orthopedic Surgery

## 2017-01-09 VITALS — BP 126/77 | HR 77

## 2017-01-09 DIAGNOSIS — M7521 Bicipital tendinitis, right shoulder: Secondary | ICD-10-CM

## 2017-01-09 DIAGNOSIS — M25511 Pain in right shoulder: Secondary | ICD-10-CM | POA: Diagnosis not present

## 2017-01-09 NOTE — Progress Notes (Signed)
Progress Note   Patient ID: Tiffany Hartman, female   DOB: [birth year], 84 y.o.   MRN: [id number]  Chief Complaint  Patient presents with  . Follow-up    Recheck on right shoulder.    HPI 84 year old female with chronic right shoulder pain severe rotator cuff induced arthropathy presents with new onset right shoulder pain with exacerbation after washing her hair 2 weeks ago  Review of Systems  Respiratory: Positive for shortness of breath.    No outpatient medications have been marked as taking for the 01/09/17 encounter (Office Visit) with Carole Civil, MD.    No Known Allergies   BP 126/77   Pulse 77 Respiratory rate 18  Physical Exam  Constitutional: She is oriented to person, place, and time. She appears well-developed and well-nourished.  Musculoskeletal:       Arms: Neurological: She is alert and oriented to person, place, and time.  Psychiatric: She has a normal mood and affect. Judgment normal.  Vitals reviewed.    Medical decision-making Encounter Diagnosis  Name Primary?  . Acute pain of right shoulder Yes   There was no trauma so no x-ray was obtained  We did give her an injection in the biceps tendon sheath where she was having maximal tenderness   Biceps tendon injection right shoulder Right biceps tendon was injected The patient gave verbal consent for cortisone injection Timeout confirmed the site of injection Medications used included 40 mg of Depo-Medrol and 3 mL 1% lidocaine After alcohol and ethyl chloride preparation the point of maximal tenderness was injected over the right biceps tendon there were no complications   Arther Abbott, MD 01/09/2017 11:26 AM

## 2017-01-09 NOTE — Patient Instructions (Signed)

## 2017-03-12 DIAGNOSIS — H401133 Primary open-angle glaucoma, bilateral, severe stage: Secondary | ICD-10-CM | POA: Diagnosis not present

## 2017-03-12 DIAGNOSIS — H47233 Glaucomatous optic atrophy, bilateral: Secondary | ICD-10-CM | POA: Diagnosis not present

## 2017-03-17 DIAGNOSIS — F339 Major depressive disorder, recurrent, unspecified: Secondary | ICD-10-CM | POA: Diagnosis not present

## 2017-03-17 DIAGNOSIS — M159 Polyosteoarthritis, unspecified: Secondary | ICD-10-CM | POA: Diagnosis not present

## 2017-03-17 DIAGNOSIS — W19XXXD Unspecified fall, subsequent encounter: Secondary | ICD-10-CM | POA: Diagnosis not present

## 2017-03-17 DIAGNOSIS — I10 Essential (primary) hypertension: Secondary | ICD-10-CM | POA: Diagnosis not present

## 2017-03-17 DIAGNOSIS — R300 Dysuria: Secondary | ICD-10-CM | POA: Diagnosis not present

## 2017-03-17 DIAGNOSIS — K839 Disease of biliary tract, unspecified: Secondary | ICD-10-CM | POA: Diagnosis not present

## 2017-03-17 DIAGNOSIS — M6281 Muscle weakness (generalized): Secondary | ICD-10-CM | POA: Diagnosis not present

## 2017-03-17 DIAGNOSIS — Z0000 Encounter for general adult medical examination without abnormal findings: Secondary | ICD-10-CM | POA: Diagnosis not present

## 2017-03-17 DIAGNOSIS — E1165 Type 2 diabetes mellitus with hyperglycemia: Secondary | ICD-10-CM | POA: Diagnosis not present

## 2017-03-17 DIAGNOSIS — F0390 Unspecified dementia without behavioral disturbance: Secondary | ICD-10-CM | POA: Diagnosis not present

## 2017-03-17 DIAGNOSIS — E041 Nontoxic single thyroid nodule: Secondary | ICD-10-CM | POA: Diagnosis not present

## 2017-03-17 DIAGNOSIS — F329 Major depressive disorder, single episode, unspecified: Secondary | ICD-10-CM | POA: Diagnosis not present

## 2017-03-17 DIAGNOSIS — G459 Transient cerebral ischemic attack, unspecified: Secondary | ICD-10-CM | POA: Diagnosis not present

## 2017-03-17 DIAGNOSIS — R7309 Other abnormal glucose: Secondary | ICD-10-CM | POA: Diagnosis not present

## 2017-03-17 DIAGNOSIS — Z131 Encounter for screening for diabetes mellitus: Secondary | ICD-10-CM | POA: Diagnosis not present

## 2017-03-19 DIAGNOSIS — E1165 Type 2 diabetes mellitus with hyperglycemia: Secondary | ICD-10-CM | POA: Diagnosis not present

## 2017-03-19 DIAGNOSIS — J449 Chronic obstructive pulmonary disease, unspecified: Secondary | ICD-10-CM | POA: Diagnosis not present

## 2017-03-19 DIAGNOSIS — M159 Polyosteoarthritis, unspecified: Secondary | ICD-10-CM | POA: Diagnosis not present

## 2017-03-19 DIAGNOSIS — M6281 Muscle weakness (generalized): Secondary | ICD-10-CM | POA: Diagnosis not present

## 2017-03-19 DIAGNOSIS — J9690 Respiratory failure, unspecified, unspecified whether with hypoxia or hypercapnia: Secondary | ICD-10-CM | POA: Diagnosis not present

## 2017-03-19 DIAGNOSIS — F0390 Unspecified dementia without behavioral disturbance: Secondary | ICD-10-CM | POA: Diagnosis not present

## 2017-03-25 DIAGNOSIS — M6281 Muscle weakness (generalized): Secondary | ICD-10-CM | POA: Diagnosis not present

## 2017-03-25 DIAGNOSIS — J449 Chronic obstructive pulmonary disease, unspecified: Secondary | ICD-10-CM | POA: Diagnosis not present

## 2017-03-25 DIAGNOSIS — F0390 Unspecified dementia without behavioral disturbance: Secondary | ICD-10-CM | POA: Diagnosis not present

## 2017-03-25 DIAGNOSIS — E1165 Type 2 diabetes mellitus with hyperglycemia: Secondary | ICD-10-CM | POA: Diagnosis not present

## 2017-03-25 DIAGNOSIS — M159 Polyosteoarthritis, unspecified: Secondary | ICD-10-CM | POA: Diagnosis not present

## 2017-03-25 DIAGNOSIS — J9690 Respiratory failure, unspecified, unspecified whether with hypoxia or hypercapnia: Secondary | ICD-10-CM | POA: Diagnosis not present

## 2017-03-27 DIAGNOSIS — J449 Chronic obstructive pulmonary disease, unspecified: Secondary | ICD-10-CM | POA: Diagnosis not present

## 2017-03-27 DIAGNOSIS — M159 Polyosteoarthritis, unspecified: Secondary | ICD-10-CM | POA: Diagnosis not present

## 2017-03-27 DIAGNOSIS — E1165 Type 2 diabetes mellitus with hyperglycemia: Secondary | ICD-10-CM | POA: Diagnosis not present

## 2017-03-27 DIAGNOSIS — M6281 Muscle weakness (generalized): Secondary | ICD-10-CM | POA: Diagnosis not present

## 2017-03-27 DIAGNOSIS — F0390 Unspecified dementia without behavioral disturbance: Secondary | ICD-10-CM | POA: Diagnosis not present

## 2017-03-27 DIAGNOSIS — J9690 Respiratory failure, unspecified, unspecified whether with hypoxia or hypercapnia: Secondary | ICD-10-CM | POA: Diagnosis not present

## 2017-03-31 DIAGNOSIS — F0390 Unspecified dementia without behavioral disturbance: Secondary | ICD-10-CM | POA: Diagnosis not present

## 2017-03-31 DIAGNOSIS — J9690 Respiratory failure, unspecified, unspecified whether with hypoxia or hypercapnia: Secondary | ICD-10-CM | POA: Diagnosis not present

## 2017-03-31 DIAGNOSIS — J449 Chronic obstructive pulmonary disease, unspecified: Secondary | ICD-10-CM | POA: Diagnosis not present

## 2017-03-31 DIAGNOSIS — M6281 Muscle weakness (generalized): Secondary | ICD-10-CM | POA: Diagnosis not present

## 2017-03-31 DIAGNOSIS — E1165 Type 2 diabetes mellitus with hyperglycemia: Secondary | ICD-10-CM | POA: Diagnosis not present

## 2017-03-31 DIAGNOSIS — M159 Polyosteoarthritis, unspecified: Secondary | ICD-10-CM | POA: Diagnosis not present

## 2017-04-02 DIAGNOSIS — J449 Chronic obstructive pulmonary disease, unspecified: Secondary | ICD-10-CM | POA: Diagnosis not present

## 2017-04-02 DIAGNOSIS — J9690 Respiratory failure, unspecified, unspecified whether with hypoxia or hypercapnia: Secondary | ICD-10-CM | POA: Diagnosis not present

## 2017-04-02 DIAGNOSIS — M159 Polyosteoarthritis, unspecified: Secondary | ICD-10-CM | POA: Diagnosis not present

## 2017-04-02 DIAGNOSIS — M6281 Muscle weakness (generalized): Secondary | ICD-10-CM | POA: Diagnosis not present

## 2017-04-02 DIAGNOSIS — F0390 Unspecified dementia without behavioral disturbance: Secondary | ICD-10-CM | POA: Diagnosis not present

## 2017-04-02 DIAGNOSIS — E1165 Type 2 diabetes mellitus with hyperglycemia: Secondary | ICD-10-CM | POA: Diagnosis not present

## 2017-04-08 DIAGNOSIS — M159 Polyosteoarthritis, unspecified: Secondary | ICD-10-CM | POA: Diagnosis not present

## 2017-04-08 DIAGNOSIS — J9690 Respiratory failure, unspecified, unspecified whether with hypoxia or hypercapnia: Secondary | ICD-10-CM | POA: Diagnosis not present

## 2017-04-08 DIAGNOSIS — J449 Chronic obstructive pulmonary disease, unspecified: Secondary | ICD-10-CM | POA: Diagnosis not present

## 2017-04-08 DIAGNOSIS — M6281 Muscle weakness (generalized): Secondary | ICD-10-CM | POA: Diagnosis not present

## 2017-04-08 DIAGNOSIS — F0390 Unspecified dementia without behavioral disturbance: Secondary | ICD-10-CM | POA: Diagnosis not present

## 2017-04-08 DIAGNOSIS — E1165 Type 2 diabetes mellitus with hyperglycemia: Secondary | ICD-10-CM | POA: Diagnosis not present

## 2017-04-09 DIAGNOSIS — M159 Polyosteoarthritis, unspecified: Secondary | ICD-10-CM | POA: Diagnosis not present

## 2017-04-09 DIAGNOSIS — J9690 Respiratory failure, unspecified, unspecified whether with hypoxia or hypercapnia: Secondary | ICD-10-CM | POA: Diagnosis not present

## 2017-04-09 DIAGNOSIS — F0390 Unspecified dementia without behavioral disturbance: Secondary | ICD-10-CM | POA: Diagnosis not present

## 2017-04-09 DIAGNOSIS — J449 Chronic obstructive pulmonary disease, unspecified: Secondary | ICD-10-CM | POA: Diagnosis not present

## 2017-04-09 DIAGNOSIS — E1165 Type 2 diabetes mellitus with hyperglycemia: Secondary | ICD-10-CM | POA: Diagnosis not present

## 2017-04-09 DIAGNOSIS — M6281 Muscle weakness (generalized): Secondary | ICD-10-CM | POA: Diagnosis not present

## 2017-04-14 DIAGNOSIS — J449 Chronic obstructive pulmonary disease, unspecified: Secondary | ICD-10-CM | POA: Diagnosis not present

## 2017-04-14 DIAGNOSIS — M6281 Muscle weakness (generalized): Secondary | ICD-10-CM | POA: Diagnosis not present

## 2017-04-14 DIAGNOSIS — F0390 Unspecified dementia without behavioral disturbance: Secondary | ICD-10-CM | POA: Diagnosis not present

## 2017-04-14 DIAGNOSIS — J9690 Respiratory failure, unspecified, unspecified whether with hypoxia or hypercapnia: Secondary | ICD-10-CM | POA: Diagnosis not present

## 2017-04-14 DIAGNOSIS — M159 Polyosteoarthritis, unspecified: Secondary | ICD-10-CM | POA: Diagnosis not present

## 2017-04-14 DIAGNOSIS — E1165 Type 2 diabetes mellitus with hyperglycemia: Secondary | ICD-10-CM | POA: Diagnosis not present

## 2017-04-17 DIAGNOSIS — J449 Chronic obstructive pulmonary disease, unspecified: Secondary | ICD-10-CM | POA: Diagnosis not present

## 2017-04-17 DIAGNOSIS — F0390 Unspecified dementia without behavioral disturbance: Secondary | ICD-10-CM | POA: Diagnosis not present

## 2017-04-17 DIAGNOSIS — J9690 Respiratory failure, unspecified, unspecified whether with hypoxia or hypercapnia: Secondary | ICD-10-CM | POA: Diagnosis not present

## 2017-04-17 DIAGNOSIS — M159 Polyosteoarthritis, unspecified: Secondary | ICD-10-CM | POA: Diagnosis not present

## 2017-04-17 DIAGNOSIS — E1165 Type 2 diabetes mellitus with hyperglycemia: Secondary | ICD-10-CM | POA: Diagnosis not present

## 2017-04-17 DIAGNOSIS — M6281 Muscle weakness (generalized): Secondary | ICD-10-CM | POA: Diagnosis not present

## 2017-04-21 DIAGNOSIS — J449 Chronic obstructive pulmonary disease, unspecified: Secondary | ICD-10-CM | POA: Diagnosis not present

## 2017-04-21 DIAGNOSIS — M159 Polyosteoarthritis, unspecified: Secondary | ICD-10-CM | POA: Diagnosis not present

## 2017-04-21 DIAGNOSIS — J9690 Respiratory failure, unspecified, unspecified whether with hypoxia or hypercapnia: Secondary | ICD-10-CM | POA: Diagnosis not present

## 2017-04-21 DIAGNOSIS — E1165 Type 2 diabetes mellitus with hyperglycemia: Secondary | ICD-10-CM | POA: Diagnosis not present

## 2017-04-21 DIAGNOSIS — F0390 Unspecified dementia without behavioral disturbance: Secondary | ICD-10-CM | POA: Diagnosis not present

## 2017-04-21 DIAGNOSIS — M6281 Muscle weakness (generalized): Secondary | ICD-10-CM | POA: Diagnosis not present

## 2017-04-23 DIAGNOSIS — E1165 Type 2 diabetes mellitus with hyperglycemia: Secondary | ICD-10-CM | POA: Diagnosis not present

## 2017-04-23 DIAGNOSIS — J449 Chronic obstructive pulmonary disease, unspecified: Secondary | ICD-10-CM | POA: Diagnosis not present

## 2017-04-23 DIAGNOSIS — J9690 Respiratory failure, unspecified, unspecified whether with hypoxia or hypercapnia: Secondary | ICD-10-CM | POA: Diagnosis not present

## 2017-04-23 DIAGNOSIS — M159 Polyosteoarthritis, unspecified: Secondary | ICD-10-CM | POA: Diagnosis not present

## 2017-04-23 DIAGNOSIS — F0390 Unspecified dementia without behavioral disturbance: Secondary | ICD-10-CM | POA: Diagnosis not present

## 2017-04-23 DIAGNOSIS — M6281 Muscle weakness (generalized): Secondary | ICD-10-CM | POA: Diagnosis not present

## 2017-04-27 DIAGNOSIS — J9690 Respiratory failure, unspecified, unspecified whether with hypoxia or hypercapnia: Secondary | ICD-10-CM | POA: Diagnosis not present

## 2017-04-27 DIAGNOSIS — F0390 Unspecified dementia without behavioral disturbance: Secondary | ICD-10-CM | POA: Diagnosis not present

## 2017-04-27 DIAGNOSIS — E1165 Type 2 diabetes mellitus with hyperglycemia: Secondary | ICD-10-CM | POA: Diagnosis not present

## 2017-04-27 DIAGNOSIS — J449 Chronic obstructive pulmonary disease, unspecified: Secondary | ICD-10-CM | POA: Diagnosis not present

## 2017-04-27 DIAGNOSIS — M6281 Muscle weakness (generalized): Secondary | ICD-10-CM | POA: Diagnosis not present

## 2017-04-27 DIAGNOSIS — M159 Polyosteoarthritis, unspecified: Secondary | ICD-10-CM | POA: Diagnosis not present

## 2017-04-29 DIAGNOSIS — E1165 Type 2 diabetes mellitus with hyperglycemia: Secondary | ICD-10-CM | POA: Diagnosis not present

## 2017-04-29 DIAGNOSIS — M6281 Muscle weakness (generalized): Secondary | ICD-10-CM | POA: Diagnosis not present

## 2017-04-29 DIAGNOSIS — J9690 Respiratory failure, unspecified, unspecified whether with hypoxia or hypercapnia: Secondary | ICD-10-CM | POA: Diagnosis not present

## 2017-04-29 DIAGNOSIS — F0390 Unspecified dementia without behavioral disturbance: Secondary | ICD-10-CM | POA: Diagnosis not present

## 2017-04-29 DIAGNOSIS — M159 Polyosteoarthritis, unspecified: Secondary | ICD-10-CM | POA: Diagnosis not present

## 2017-04-29 DIAGNOSIS — J449 Chronic obstructive pulmonary disease, unspecified: Secondary | ICD-10-CM | POA: Diagnosis not present

## 2017-05-05 DIAGNOSIS — F0390 Unspecified dementia without behavioral disturbance: Secondary | ICD-10-CM | POA: Diagnosis not present

## 2017-05-05 DIAGNOSIS — J449 Chronic obstructive pulmonary disease, unspecified: Secondary | ICD-10-CM | POA: Diagnosis not present

## 2017-05-05 DIAGNOSIS — E1165 Type 2 diabetes mellitus with hyperglycemia: Secondary | ICD-10-CM | POA: Diagnosis not present

## 2017-05-05 DIAGNOSIS — J9690 Respiratory failure, unspecified, unspecified whether with hypoxia or hypercapnia: Secondary | ICD-10-CM | POA: Diagnosis not present

## 2017-05-05 DIAGNOSIS — M159 Polyosteoarthritis, unspecified: Secondary | ICD-10-CM | POA: Diagnosis not present

## 2017-05-05 DIAGNOSIS — M6281 Muscle weakness (generalized): Secondary | ICD-10-CM | POA: Diagnosis not present

## 2017-05-07 DIAGNOSIS — J449 Chronic obstructive pulmonary disease, unspecified: Secondary | ICD-10-CM | POA: Diagnosis not present

## 2017-05-07 DIAGNOSIS — E1165 Type 2 diabetes mellitus with hyperglycemia: Secondary | ICD-10-CM | POA: Diagnosis not present

## 2017-05-07 DIAGNOSIS — J9690 Respiratory failure, unspecified, unspecified whether with hypoxia or hypercapnia: Secondary | ICD-10-CM | POA: Diagnosis not present

## 2017-05-07 DIAGNOSIS — M6281 Muscle weakness (generalized): Secondary | ICD-10-CM | POA: Diagnosis not present

## 2017-05-07 DIAGNOSIS — F0390 Unspecified dementia without behavioral disturbance: Secondary | ICD-10-CM | POA: Diagnosis not present

## 2017-05-07 DIAGNOSIS — M159 Polyosteoarthritis, unspecified: Secondary | ICD-10-CM | POA: Diagnosis not present

## 2017-06-08 ENCOUNTER — Ambulatory Visit: Payer: Medicare Other | Admitting: Orthopedic Surgery

## 2017-07-22 ENCOUNTER — Ambulatory Visit (INDEPENDENT_AMBULATORY_CARE_PROVIDER_SITE_OTHER): Payer: Medicare Other

## 2017-07-22 ENCOUNTER — Ambulatory Visit (INDEPENDENT_AMBULATORY_CARE_PROVIDER_SITE_OTHER): Payer: Medicare Other | Admitting: Orthopedic Surgery

## 2017-07-22 ENCOUNTER — Encounter: Payer: Self-pay | Admitting: Orthopedic Surgery

## 2017-07-22 VITALS — BP 144/69 | HR 64 | Ht 65.5 in | Wt 224.0 lb

## 2017-07-22 DIAGNOSIS — M25511 Pain in right shoulder: Secondary | ICD-10-CM | POA: Diagnosis not present

## 2017-07-22 DIAGNOSIS — M5030 Other cervical disc degeneration, unspecified cervical region: Secondary | ICD-10-CM

## 2017-07-22 DIAGNOSIS — G8929 Other chronic pain: Secondary | ICD-10-CM

## 2017-07-22 DIAGNOSIS — M4712 Other spondylosis with myelopathy, cervical region: Secondary | ICD-10-CM | POA: Diagnosis not present

## 2017-07-22 DIAGNOSIS — M25512 Pain in left shoulder: Secondary | ICD-10-CM

## 2017-07-22 MED ORDER — HYDROCODONE-ACETAMINOPHEN 5-325 MG PO TABS: 1.0000 | ORAL_TABLET | Freq: Four times a day (QID) | ORAL | 0 refills | Status: AC | PRN

## 2017-07-22 NOTE — Progress Notes (Signed)
Progress Note   Patient ID: Tiffany Hartman, female   DOB: Sep 29, 1927, 84 y.o.   MRN: [id number] Chief Complaint  Patient presents with  . Shoulder Pain    bilateral/ wants injection  . Neck Pain     Chief Complaint  Patient presents with  . Shoulder Pain    bilateral/ wants injection  . Neck Pain    84 year old female presents for bilateral shoulder injections which she is had in the past with good result  However, she also presents for evaluation of recent onset of increasing neck and arm pain  About 2 weeks ago patient noticed that she was having increased pain and stiffness in the cervical spine associated with decreased strength and weakness in her right hand and affecting her ability to perform activities of daily living.  The pain is severe enough to breakthrough gabapentin   She has a dull deep aching pain in the neck radiating down into the shoulder into the right hand associated with some sensory changes.    Review of Systems  Musculoskeletal: Positive for joint pain and neck pain.       Difficulty walking x2 weeks  Neurological: Positive for tingling, sensory change, focal weakness and weakness.   Current Meds  Medication Sig  . albuterol (PROVENTIL HFA;VENTOLIN HFA) 108 (90 Base) MCG/ACT inhaler Inhale 1-2 puffs into the lungs every 6 (six) hours as needed for wheezing or shortness of breath.  . ALPRAZolam (XANAX) 0.25 MG tablet Take 0.25 mg by mouth 3 (three) times daily.   Marland Kitchen amLODipine (NORVASC) 5 MG tablet Take 5 mg by mouth daily.  . Calcium Carbonate-Vitamin D (CALCIUM + D) 600-200 MG-UNIT TABS Take 1 tablet by mouth every evening.   . clopidogrel (PLAVIX) 75 MG tablet Take 1 tablet (75 mg total) by mouth daily with breakfast.  . donepezil (ARICEPT) 10 MG tablet Take 10 mg by mouth at bedtime.  . ferrous gluconate (FERGON) 325 MG tablet Take 325 mg by mouth daily with breakfast.  . FLUoxetine (PROZAC) 20 MG capsule Take 20 mg by mouth daily.  . folic acid (FOLVITE)  1 MG tablet Take 1 mg by mouth daily.  . furosemide (LASIX) 40 MG tablet Take 40 mg by mouth daily.   Marland Kitchen gabapentin (NEURONTIN) 300 MG capsule Take 1 capsule (300 mg total) by mouth 3 (three) times daily. (Patient taking differently: Take 300 mg by mouth 2 (two) times daily. )  . ipratropium-albuterol (DUONEB) 0.5-2.5 (3) MG/3ML SOLN Take 3 mLs by nebulization every 4 (four) hours. (Patient taking differently: Take 3 mLs by nebulization 4 (four) times daily. )  . latanoprost (XALATAN) 0.005 % ophthalmic solution Place 1 drop into both eyes at bedtime.  . levETIRAcetam (KEPPRA) 250 MG tablet Take 250 mg by mouth 2 (two) times daily.  Marland Kitchen losartan (COZAAR) 100 MG tablet Take 100 mg by mouth daily.  Marland Kitchen lovastatin (MEVACOR) 10 MG tablet Take 10 mg by mouth at bedtime.  Marland Kitchen omeprazole (PRILOSEC) 20 MG capsule Take 20 mg by mouth 2 (two) times daily.   . potassium chloride SA (K-DUR,KLOR-CON) 20 MEQ tablet Take 20 mEq by mouth 2 (two) times daily.  . prednisoLONE acetate (PRED FORTE) 1 % ophthalmic suspension Place 1 drop into the right eye daily.   . [DISCONTINUED] HYDROcodone-acetaminophen (NORCO/VICODIN) 5-325 MG tablet Take 1 tablet by mouth every 6 (six) hours as needed for moderate pain.    Past Medical History:  Diagnosis Date  . Asthma   . Breast cancer ([place])  02/20/83   LT mastectomy  . Bronchitis   . Dementia   . Diabetes mellitus ([place])   . Diverticulitis 2009  . Glaucoma   . HTN (hypertension)   . OA (osteoarthritis)   . Obesity 08/17/2014  . Parkinson disease ([place])   . Stroke ([place])   . Thyroid nodule    Dr Legrand Rams     No Known Allergies   BP (!) 144/69   Pulse 64   Ht 5' 5.5" (1.664 m)   Wt 224 lb (101.6 kg)   BMI 36.71 kg/m    Physical Exam  Constitutional: She is oriented to person, place, and time. She appears well-developed and well-nourished.  Neurological: She is alert and oriented to person, place, and time. Gait abnormal.  Cane and staggering  Psychiatric: She has  a normal mood and affect. Judgment normal.  Vitals reviewed.   Ortho Exam   Neck alignment is abnormal.  She has hyperextension of the cervical spine compensatory.  She has tenderness in the cervical spine painful range of motion especially rotating to the right with decreased extension and rotation positive Spurling sign with right sided pain increased muscle tone on the right skin normal  Right shoulder no active flexion or abduction passive range of motion is painful with crepitance there is tenderness in the posterior anterior joint line without instability.  Weakness in the rotator cuff grade 3-4 out of 5 normal skin lymph nodes are negative pulse and perfusion are normal there is no edema in the arm and she has normal sensation  Right hand grip strength is demonstrably weaker than the left.  She has normal reflexes at the elbow 0 at the wrist bilaterally  Left shoulder painful passive range of motion with no active flexion or abduction her shoulder is stable muscle tone is normal skin is normal pulse and perfusion are excellent lymph nodes are negative sensation is normal reflexes as described she has tenderness in the anterior and posterior joint line  Both shoulders exhibit positive Neer signs for impingement  Arther Abbott, MD   MEDICAL DECISION MAKING   Imaging:  Cervical spine x-ray   NEW PROBLEM  Encounter Diagnoses  Name Primary?  . Chronic right shoulder pain   . Chronic pain in left shoulder   . Spondylosis, cervical, with myelopathy Yes     PLAN: (RX., injection, surgery,frx,mri/ct, XR 2 body ares) Bilateral shoulder injections  The patient is on Plavix and is probably not a good candidate for NSAID therapy and is already on gabapentin and hydrocodone for pain unrelieved with these medicines.  Based on her right upper extremity weakness gait disturbance and radicular pain recommend MRI cervical spine  Did not recommend steroids based on diabetic  history   Current Outpatient Medications:  .  albuterol (PROVENTIL HFA;VENTOLIN HFA) 108 (90 Base) MCG/ACT inhaler, Inhale 1-2 puffs into the lungs every 6 (six) hours as needed for wheezing or shortness of breath., Disp: , Rfl:  .  ALPRAZolam (XANAX) 0.25 MG tablet, Take 0.25 mg by mouth 3 (three) times daily. , Disp: , Rfl:  .  amLODipine (NORVASC) 5 MG tablet, Take 5 mg by mouth daily., Disp: , Rfl:  .  Calcium Carbonate-Vitamin D (CALCIUM + D) 600-200 MG-UNIT TABS, Take 1 tablet by mouth every evening. , Disp: , Rfl:  .  clopidogrel (PLAVIX) 75 MG tablet, Take 1 tablet (75 mg total) by mouth daily with breakfast., Disp: 30 tablet, Rfl: 3 .  donepezil (ARICEPT) 10 MG tablet, Take 10  mg by mouth at bedtime., Disp: , Rfl:  .  ferrous gluconate (FERGON) 325 MG tablet, Take 325 mg by mouth daily with breakfast., Disp: , Rfl:  .  FLUoxetine (PROZAC) 20 MG capsule, Take 20 mg by mouth daily., Disp: , Rfl:  .  folic acid (FOLVITE) 1 MG tablet, Take 1 mg by mouth daily., Disp: , Rfl:  .  furosemide (LASIX) 40 MG tablet, Take 40 mg by mouth daily. , Disp: , Rfl:  .  gabapentin (NEURONTIN) 300 MG capsule, Take 1 capsule (300 mg total) by mouth 3 (three) times daily. (Patient taking differently: Take 300 mg by mouth 2 (two) times daily. ), Disp: 30 capsule, Rfl: 1 .  ipratropium-albuterol (DUONEB) 0.5-2.5 (3) MG/3ML SOLN, Take 3 mLs by nebulization every 4 (four) hours. (Patient taking differently: Take 3 mLs by nebulization 4 (four) times daily. ), Disp: 360 mL, Rfl: 3 .  latanoprost (XALATAN) 0.005 % ophthalmic solution, Place 1 drop into both eyes at bedtime., Disp: , Rfl:  .  levETIRAcetam (KEPPRA) 250 MG tablet, Take 250 mg by mouth 2 (two) times daily., Disp: , Rfl:  .  losartan (COZAAR) 100 MG tablet, Take 100 mg by mouth daily., Disp: , Rfl:  .  lovastatin (MEVACOR) 10 MG tablet, Take 10 mg by mouth at bedtime., Disp: , Rfl:  .  omeprazole (PRILOSEC) 20 MG capsule, Take 20 mg by mouth 2 (two)  times daily. , Disp: , Rfl:  .  potassium chloride SA (K-DUR,KLOR-CON) 20 MEQ tablet, Take 20 mEq by mouth 2 (two) times daily., Disp: , Rfl:  .  prednisoLONE acetate (PRED FORTE) 1 % ophthalmic suspension, Place 1 drop into the right eye daily. , Disp: , Rfl:  .  HYDROcodone-acetaminophen (NORCO/VICODIN) 5-325 MG tablet, Take 1 tablet by mouth every 6 (six) hours as needed for up to 5 days for moderate pain., Disp: 20 tablet, Rfl: 0  NARX SCORES  Narcotic  200  Sedative  351  Stimulant  000  Explanation and Guidance  OVERDOSE RISK SCORE  020  (Range 000-999)  Meds ordered this encounter  Medications  . HYDROcodone-acetaminophen (NORCO/VICODIN) 5-325 MG tablet    Sig: Take 1 tablet by mouth every 6 (six) hours as needed for up to 5 days for moderate pain.    Dispense:  20 tablet    Refill:  0   4:05 PM 07/22/2017

## 2017-07-22 NOTE — Patient Instructions (Signed)
MRI will be scheduled Dr. Aline Brochure will call the patient with the results as I will be out of town in the next 2 weeks

## 2017-07-28 DIAGNOSIS — I10 Essential (primary) hypertension: Secondary | ICD-10-CM | POA: Diagnosis not present

## 2017-07-28 DIAGNOSIS — J449 Chronic obstructive pulmonary disease, unspecified: Secondary | ICD-10-CM | POA: Diagnosis not present

## 2017-07-28 DIAGNOSIS — E1165 Type 2 diabetes mellitus with hyperglycemia: Secondary | ICD-10-CM | POA: Diagnosis not present

## 2017-07-28 DIAGNOSIS — F0390 Unspecified dementia without behavioral disturbance: Secondary | ICD-10-CM | POA: Diagnosis not present

## 2017-07-30 ENCOUNTER — Ambulatory Visit (HOSPITAL_COMMUNITY): Payer: Medicare Other

## 2017-08-05 ENCOUNTER — Ambulatory Visit (HOSPITAL_COMMUNITY)
Admission: RE | Admit: 2017-08-05 | Discharge: 2017-08-05 | Disposition: A | Discharge status: Home / Self Care | Payer: Medicare Other | Source: Ambulatory Visit | Attending: Orthopedic Surgery | Admitting: Orthopedic Surgery

## 2017-08-05 DIAGNOSIS — M5031 Other cervical disc degeneration,  high cervical region: Secondary | ICD-10-CM | POA: Diagnosis not present

## 2017-08-05 DIAGNOSIS — M5030 Other cervical disc degeneration, unspecified cervical region: Secondary | ICD-10-CM | POA: Diagnosis present

## 2017-08-05 DIAGNOSIS — M542 Cervicalgia: Secondary | ICD-10-CM | POA: Diagnosis not present

## 2017-08-05 DIAGNOSIS — M4802 Spinal stenosis, cervical region: Secondary | ICD-10-CM | POA: Diagnosis not present

## 2017-08-10 ENCOUNTER — Telehealth: Payer: Self-pay | Admitting: Orthopedic Surgery

## 2017-08-10 NOTE — Telephone Encounter (Signed)
Patient called to inquire about MRI results, C-spine, which was done on 08/05/17; per office visit 07/22/17, patient was to receive results by phone. Please advise if call or schedule follow up appointment. States she has answer machine at ph# [phone]

## 2017-08-11 NOTE — Telephone Encounter (Signed)
Dr Aline Brochure has message , he was out of the office when it resulted, her choice whether to wait on phone call or make appointment.

## 2017-08-17 ENCOUNTER — Ambulatory Visit (INDEPENDENT_AMBULATORY_CARE_PROVIDER_SITE_OTHER): Payer: Medicare Other | Admitting: Orthopedic Surgery

## 2017-08-17 VITALS — BP 108/60 | HR 75 | Ht 65.0 in | Wt 224.0 lb

## 2017-08-17 DIAGNOSIS — G894 Chronic pain syndrome: Secondary | ICD-10-CM

## 2017-08-17 DIAGNOSIS — M4712 Other spondylosis with myelopathy, cervical region: Secondary | ICD-10-CM

## 2017-08-17 MED ORDER — HYDROCODONE-ACETAMINOPHEN 5-325 MG PO TABS: 1.0000 | ORAL_TABLET | Freq: Four times a day (QID) | ORAL | 0 refills | Status: DC | PRN

## 2017-08-17 NOTE — Telephone Encounter (Signed)
Reached patient (had been trying to call) - patient elects to come in; will call to confirm, as needs to arrange transportation with daughter or other family member.

## 2017-08-17 NOTE — Progress Notes (Signed)
Chief Complaint  Patient presents with  . Follow-up    Recheck on neck with MRI results.    History 84 year old female neck pain and right shoulder pain with numbness in the right upper extremity    MRI shows cervical stenosis but primarily to the left mild cord compression no myelopathy on MRI  Patient is on chronic oxygen COPD 84 years old not a surgical candidate  Review of systems still has difficulty with weakness in the right upper extremity  MRI report included and has been reviewed I agree that she has severe spinal stenosis  Recommend chronic pain management  Encounter Diagnoses  Name Primary?  . Spondylosis, cervical, with myelopathy Yes  . Chronic pain syndrome     Meds ordered this encounter  Medications  . HYDROcodone-acetaminophen (NORCO/VICODIN) 5-325 MG tablet    Sig: Take 1 tablet by mouth every 6 (six) hours as needed for moderate pain.    Dispense:  90 tablet    Refill:  0

## 2017-09-22 DIAGNOSIS — H47233 Glaucomatous optic atrophy, bilateral: Secondary | ICD-10-CM | POA: Diagnosis not present

## 2017-09-22 DIAGNOSIS — H401133 Primary open-angle glaucoma, bilateral, severe stage: Secondary | ICD-10-CM | POA: Diagnosis not present

## 2017-09-22 DIAGNOSIS — H18231 Secondary corneal edema, right eye: Secondary | ICD-10-CM | POA: Diagnosis not present

## 2017-10-02 DIAGNOSIS — Z23 Encounter for immunization: Secondary | ICD-10-CM | POA: Diagnosis not present

## 2017-10-20 DIAGNOSIS — H401133 Primary open-angle glaucoma, bilateral, severe stage: Secondary | ICD-10-CM | POA: Diagnosis not present

## 2017-10-20 DIAGNOSIS — H47233 Glaucomatous optic atrophy, bilateral: Secondary | ICD-10-CM | POA: Diagnosis not present

## 2017-10-27 DIAGNOSIS — K839 Disease of biliary tract, unspecified: Secondary | ICD-10-CM | POA: Diagnosis not present

## 2017-10-27 DIAGNOSIS — Z1389 Encounter for screening for other disorder: Secondary | ICD-10-CM | POA: Diagnosis not present

## 2017-10-27 DIAGNOSIS — J449 Chronic obstructive pulmonary disease, unspecified: Secondary | ICD-10-CM | POA: Diagnosis not present

## 2017-10-27 DIAGNOSIS — E041 Nontoxic single thyroid nodule: Secondary | ICD-10-CM | POA: Diagnosis not present

## 2017-10-27 DIAGNOSIS — M159 Polyosteoarthritis, unspecified: Secondary | ICD-10-CM | POA: Diagnosis not present

## 2017-10-27 DIAGNOSIS — F0390 Unspecified dementia without behavioral disturbance: Secondary | ICD-10-CM | POA: Diagnosis not present

## 2017-10-27 DIAGNOSIS — E1165 Type 2 diabetes mellitus with hyperglycemia: Secondary | ICD-10-CM | POA: Diagnosis not present

## 2017-10-27 DIAGNOSIS — R300 Dysuria: Secondary | ICD-10-CM | POA: Diagnosis not present

## 2017-10-27 DIAGNOSIS — Z131 Encounter for screening for diabetes mellitus: Secondary | ICD-10-CM | POA: Diagnosis not present

## 2017-10-27 DIAGNOSIS — Z0000 Encounter for general adult medical examination without abnormal findings: Secondary | ICD-10-CM | POA: Diagnosis not present

## 2017-10-27 DIAGNOSIS — F329 Major depressive disorder, single episode, unspecified: Secondary | ICD-10-CM | POA: Diagnosis not present

## 2017-10-27 DIAGNOSIS — Z1331 Encounter for screening for depression: Secondary | ICD-10-CM | POA: Diagnosis not present

## 2017-10-27 DIAGNOSIS — I10 Essential (primary) hypertension: Secondary | ICD-10-CM | POA: Diagnosis not present

## 2017-10-27 DIAGNOSIS — G459 Transient cerebral ischemic attack, unspecified: Secondary | ICD-10-CM | POA: Diagnosis not present

## 2017-11-02 ENCOUNTER — Ambulatory Visit (HOSPITAL_COMMUNITY)
Admission: RE | Admit: 2017-11-02 | Discharge: 2017-11-02 | Disposition: A | Discharge status: Home / Self Care | Payer: Medicare Other | Source: Ambulatory Visit | Attending: Internal Medicine | Admitting: Internal Medicine

## 2017-11-02 ENCOUNTER — Other Ambulatory Visit (HOSPITAL_COMMUNITY): Payer: Self-pay | Admitting: Internal Medicine

## 2017-11-02 DIAGNOSIS — I700 Atherosclerosis of aorta: Secondary | ICD-10-CM | POA: Insufficient documentation

## 2017-11-02 DIAGNOSIS — J449 Chronic obstructive pulmonary disease, unspecified: Secondary | ICD-10-CM | POA: Diagnosis not present

## 2017-11-02 DIAGNOSIS — R0602 Shortness of breath: Secondary | ICD-10-CM | POA: Diagnosis not present

## 2017-11-05 ENCOUNTER — Other Ambulatory Visit (HOSPITAL_COMMUNITY): Payer: Self-pay | Admitting: Internal Medicine

## 2017-11-05 DIAGNOSIS — R9389 Abnormal findings on diagnostic imaging of other specified body structures: Secondary | ICD-10-CM

## 2017-11-09 DIAGNOSIS — Z131 Encounter for screening for diabetes mellitus: Secondary | ICD-10-CM | POA: Diagnosis not present

## 2017-11-10 DIAGNOSIS — K625 Hemorrhage of anus and rectum: Secondary | ICD-10-CM | POA: Diagnosis not present

## 2017-11-11 ENCOUNTER — Ambulatory Visit (HOSPITAL_COMMUNITY): Payer: Medicare Other

## 2017-11-13 ENCOUNTER — Ambulatory Visit (HOSPITAL_COMMUNITY)
Admission: RE | Admit: 2017-11-13 | Discharge: 2017-11-13 | Disposition: A | Discharge status: Home / Self Care | Payer: Medicare Other | Source: Ambulatory Visit | Attending: Internal Medicine | Admitting: Internal Medicine

## 2017-11-13 DIAGNOSIS — E041 Nontoxic single thyroid nodule: Secondary | ICD-10-CM | POA: Diagnosis not present

## 2017-11-13 DIAGNOSIS — R9389 Abnormal findings on diagnostic imaging of other specified body structures: Secondary | ICD-10-CM | POA: Diagnosis not present

## 2017-11-13 DIAGNOSIS — I700 Atherosclerosis of aorta: Secondary | ICD-10-CM | POA: Diagnosis not present

## 2017-11-13 DIAGNOSIS — R918 Other nonspecific abnormal finding of lung field: Secondary | ICD-10-CM | POA: Insufficient documentation

## 2017-11-13 DIAGNOSIS — I2510 Atherosclerotic heart disease of native coronary artery without angina pectoris: Secondary | ICD-10-CM | POA: Insufficient documentation

## 2017-11-18 ENCOUNTER — Encounter: Payer: Self-pay | Admitting: Orthopedic Surgery

## 2017-11-18 ENCOUNTER — Ambulatory Visit (INDEPENDENT_AMBULATORY_CARE_PROVIDER_SITE_OTHER): Payer: Medicare Other

## 2017-11-18 ENCOUNTER — Ambulatory Visit (INDEPENDENT_AMBULATORY_CARE_PROVIDER_SITE_OTHER): Payer: Medicare Other | Admitting: Orthopedic Surgery

## 2017-11-18 ENCOUNTER — Other Ambulatory Visit: Payer: Self-pay | Admitting: Orthopedic Surgery

## 2017-11-18 VITALS — BP 149/66 | HR 82 | Ht 65.0 in | Wt 230.0 lb

## 2017-11-18 DIAGNOSIS — M25561 Pain in right knee: Secondary | ICD-10-CM

## 2017-11-18 DIAGNOSIS — G8929 Other chronic pain: Secondary | ICD-10-CM

## 2017-11-18 DIAGNOSIS — M25511 Pain in right shoulder: Secondary | ICD-10-CM

## 2017-11-18 DIAGNOSIS — G894 Chronic pain syndrome: Secondary | ICD-10-CM | POA: Diagnosis not present

## 2017-11-18 DIAGNOSIS — M4712 Other spondylosis with myelopathy, cervical region: Secondary | ICD-10-CM | POA: Diagnosis not present

## 2017-11-18 DIAGNOSIS — Z96651 Presence of right artificial knee joint: Secondary | ICD-10-CM | POA: Diagnosis not present

## 2017-11-18 DIAGNOSIS — M25551 Pain in right hip: Secondary | ICD-10-CM | POA: Diagnosis not present

## 2017-11-18 MED ORDER — HYDROCODONE-ACETAMINOPHEN 5-325 MG PO TABS: 1.0000 | ORAL_TABLET | Freq: Four times a day (QID) | ORAL | 0 refills | Status: DC | PRN

## 2017-11-18 MED ORDER — METHYLPREDNISOLONE ACETATE 40 MG/ML IJ SUSP
40.0000 mg | Freq: Once | INTRAMUSCULAR | Status: AC
  Administered 2017-11-18: 40 mg via INTRAMUSCULAR

## 2017-11-18 NOTE — Progress Notes (Signed)
NEW PROBLEM OFFICE VISIT  Chief Complaint  Patient presents with  . Knee Pain    right   . Hip Pain    right   . Shoulder Pain    right     84 year old female with chronic pain which we are managing because of her issues with transportation.  She is doing well with hydrocodone but ran out 3 weeks ago.  She comes in with several complaints.  She has chronic right shoulder pain inoperable  She has new onset right thigh hip and knee pain status post knee replacement about 20 years ago and status post periprosthetic fracture 18 or 19 years ago with peri-prosthetic plate placed at [facility]  2 weeks she complains of pain and swelling proximal mid right thigh which is become severe and interferes with her getting up out of a seat or chair.  Dull aching pain  She is noticed her right hip pain and lower back pain over the last few weeks as well   Review of Systems  Constitutional: Negative for chills and fever.  Respiratory: Positive for shortness of breath.   Cardiovascular: Negative for chest pain.  Skin: Negative.   Neurological: Positive for weakness. Negative for tingling and sensory change.     Past Medical History:  Diagnosis Date  . Asthma   . Breast cancer ([place]) 02/20/83   LT mastectomy  . Bronchitis   . Dementia ([place])   . Diabetes mellitus ([place])   . Diverticulitis 2009  . Glaucoma   . HTN (hypertension)   . OA (osteoarthritis)   . Obesity 08/17/2014  . Parkinson disease ([place])   . Stroke ([place])   . Thyroid nodule    Dr Legrand Rams    Past Surgical History:  Procedure Laterality Date  .  bilateral catracts    . ABDOMINAL HYSTERECTOMY     complete  . APPENDECTOMY    . EUS  09/10/2011   Procedure: UPPER ENDOSCOPIC ULTRASOUND (EUS) RADIAL;  Surgeon: Arta Silence, MD;  [facility];  Service: Endoscopy;  Laterality: N/A;  christina/ebp  . FINE NEEDLE ASPIRATION  09/10/2011   Procedure: FINE NEEDLE ASPIRATION (FNA) LINEAR;  Surgeon: Arta Silence, MD;  [facility];  Service: Endoscopy;  Laterality: N/A;  . FRACTURE SURGERY  2008    right and left femurs  . JOINT REPLACEMENT  1998/1999   knee boths  . KNEE SURGERY Right    TKA  . KNEE SURGERY Left    TKA  . KNEE SURGERY Right    Periprosthetic fracture /otif  . MASTECTOMY  02/20/1983   left   . PARS PLANA VITRECTOMY Right 06/06/2016   Procedure: VITRECTOMY WITH CULTURES AND INJECTION OF ANTIBIOTICS; ANTERIOR CHAMBER WASHOUT;  Surgeon: Jalene Mullet, MD;  Location: [place];  Service: Ophthalmology;  Laterality: Right;    Family History  Problem Relation Age of Onset  . Huntington's disease Mother   . Stroke Father   . Colon polyps Daughter 44  . Throat cancer Son    Social History   Tobacco Use  . Smoking status: Never Smoker  . Smokeless tobacco: Never Used  Substance Use Topics  . Alcohol use: No  . Drug use: No    No Known Allergies  Current Meds  Medication Sig  . albuterol (PROVENTIL HFA;VENTOLIN HFA) 108 (90 Base) MCG/ACT inhaler Inhale 1-2 puffs into the lungs every 6 (six) hours as needed for wheezing or shortness of breath.  . ALPRAZolam (XANAX) 0.25 MG tablet  Take 0.25 mg by mouth 3 (three) times daily.   Marland Kitchen amLODipine (NORVASC) 5 MG tablet Take 5 mg by mouth daily.  . Calcium Carbonate-Vitamin D (CALCIUM + D) 600-200 MG-UNIT TABS Take 1 tablet by mouth every evening.   . clopidogrel (PLAVIX) 75 MG tablet Take 1 tablet (75 mg total) by mouth daily with breakfast.  . donepezil (ARICEPT) 10 MG tablet Take 10 mg by mouth at bedtime.  . ferrous gluconate (FERGON) 325 MG tablet Take 325 mg by mouth daily with breakfast.  . FLUoxetine (PROZAC) 20 MG capsule Take 20 mg by mouth daily.  . folic acid (FOLVITE) 1 MG tablet Take 1 mg by mouth daily.  . furosemide (LASIX) 40 MG tablet Take 40 mg by mouth daily.   Marland Kitchen gabapentin (NEURONTIN) 300 MG capsule Take 1 capsule (300 mg total) by mouth 3 (three) times daily. (Patient taking differently: Take 300  mg by mouth 2 (two) times daily. )  . HYDROcodone-acetaminophen (NORCO/VICODIN) 5-325 MG tablet Take 1 tablet by mouth every 6 (six) hours as needed for moderate pain.  Marland Kitchen ipratropium-albuterol (DUONEB) 0.5-2.5 (3) MG/3ML SOLN Take 3 mLs by nebulization every 4 (four) hours. (Patient taking differently: Take 3 mLs by nebulization 4 (four) times daily. )  . latanoprost (XALATAN) 0.005 % ophthalmic solution Place 1 drop into both eyes at bedtime.  . levETIRAcetam (KEPPRA) 250 MG tablet Take 250 mg by mouth 2 (two) times daily.  Marland Kitchen losartan (COZAAR) 100 MG tablet Take 100 mg by mouth daily.  Marland Kitchen lovastatin (MEVACOR) 10 MG tablet Take 10 mg by mouth at bedtime.  Marland Kitchen omeprazole (PRILOSEC) 20 MG capsule Take 20 mg by mouth 2 (two) times daily.   . potassium chloride SA (K-DUR,KLOR-CON) 20 MEQ tablet Take 20 mEq by mouth 2 (two) times daily.  . prednisoLONE acetate (PRED FORTE) 1 % ophthalmic suspension Place 1 drop into the right eye daily.   . [DISCONTINUED] HYDROcodone-acetaminophen (NORCO/VICODIN) 5-325 MG tablet Take 1 tablet by mouth every 6 (six) hours as needed for moderate pain.    BP (!) 149/66   Pulse 82   Ht 5\' 5"  (1.651 m)   Wt 230 lb (104.3 kg)   BMI 38.27 kg/m   Physical Exam Normal development mesomorphic body habitus uses a cane grooming well  She has mild peripheral peripheral edema in both lower legs with normal temperature.  Groin area shows no lymphadenopathy she is oriented x3 mood and affect are normal.  She ambulates with a cane. Ortho Exam She does have swelling and tenderness in the proximal to mid thigh.  She does move her knee well however with 100 degrees of flexion and near full extension.  The knee feels stable she appears to have good muscle tone there with no tremor her skin looks good her incision looks normal  Her right shoulder has poor range of motion in all planes but it is stable she is tender in the proximal shoulder she has weakness in abduction and internal and  external  Right hip is tender in the right lower back actually with minimal tenderness over the greater trochanter.  Hip flexion normal   MEDICAL DECISION SECTION  Xrays were done at [facility]  My independent reading of xrays:  Please see my dictated report her x-ray shows a stable prosthesis well-placed plate no fracture dislocation or loose hardware  Encounter Diagnoses  Name Primary?  . Chronic pain of right knee Yes  . Spondylosis, cervical, with myelopathy   . Chronic  right shoulder pain   . Status post total right knee replacement/preriprosthetic fracture orif at [facility]   . Chronic pain syndrome     PLAN: (Rx., injectx, surgery, frx, mri/ct) [email or web address] search completed   No evidence of hardware failure periprosthetic reaction or bone change in the area of tenderness or swelling.  Please see dictated report.  Pain contract was done today  Knee replacement looks fine as is the hardware from the periprosthetic fracture  Cervical spondylosis under control  Chronic right shoulder pain injection given  Meds ordered this encounter  Medications  . HYDROcodone-acetaminophen (NORCO/VICODIN) 5-325 MG tablet    Sig: Take 1 tablet by mouth every 6 (six) hours as needed for moderate pain.    Dispense:  90 tablet    Refill:  0   Injection IM right hip   Injection right shoulder subacromial space A steroid injection was performed at intramuscular right gluteal area using 1% plain Lidocaine and 40 mg of Depo-Medrol. This was well tolerated.  Procedure note the subacromial injection shoulder RIGHT  Verbal consent was obtained to inject the  RIGHT   Shoulder  Timeout was completed to confirm the injection site is a subacromial space of the  RIGHT  shoulder   Medication used Depo-Medrol 40 mg and lidocaine 1% 3 cc  Anesthesia was provided by ethyl chloride  The injection was performed in the RIGHT  posterior subacromial space. After pinning the skin  with alcohol and anesthetized the skin with ethyl chloride the subacromial space was injected using a 20-gauge needle. There were no complications  Sterile dressing was applied.  Recommend arthritis creams for the right thigh   28-month follow-up chronic pain syndrome  Arther Abbott, MD  11/18/2017 2:30 PM

## 2017-11-18 NOTE — Patient Instructions (Signed)
These are the muscle and arthrits creams I recommend:  PLEASE READ THE PACKAGE INSTRUCTIONS BEFORE USING   Ben Gay arthritis cream  Icy hot vanishing gel  Aspercreme odor free  Myoflex Oderless pain reliever  Capzasin  Sportscreme  Max freeze  

## 2017-11-24 ENCOUNTER — Other Ambulatory Visit: Payer: Self-pay

## 2018-01-26 DIAGNOSIS — I10 Essential (primary) hypertension: Secondary | ICD-10-CM | POA: Diagnosis not present

## 2018-01-26 DIAGNOSIS — E1165 Type 2 diabetes mellitus with hyperglycemia: Secondary | ICD-10-CM | POA: Diagnosis not present

## 2018-01-26 DIAGNOSIS — J441 Chronic obstructive pulmonary disease with (acute) exacerbation: Secondary | ICD-10-CM | POA: Diagnosis not present

## 2018-02-13 ENCOUNTER — Inpatient Hospital Stay (HOSPITAL_COMMUNITY)
Admission: EM | Admit: 2018-02-13 | Discharge: 2018-02-16 | DRG: 533 | Disposition: A | Discharge status: Skilled Nursing Facility | Payer: Medicare Other | Attending: Internal Medicine | Admitting: Internal Medicine

## 2018-02-13 ENCOUNTER — Encounter (HOSPITAL_COMMUNITY): Payer: Self-pay | Admitting: *Deleted

## 2018-02-13 ENCOUNTER — Emergency Department (HOSPITAL_COMMUNITY): Payer: Medicare Other

## 2018-02-13 ENCOUNTER — Other Ambulatory Visit: Payer: Self-pay

## 2018-02-13 DIAGNOSIS — I10 Essential (primary) hypertension: Secondary | ICD-10-CM | POA: Diagnosis present

## 2018-02-13 DIAGNOSIS — M25551 Pain in right hip: Secondary | ICD-10-CM | POA: Diagnosis present

## 2018-02-13 DIAGNOSIS — F0390 Unspecified dementia without behavioral disturbance: Secondary | ICD-10-CM | POA: Diagnosis not present

## 2018-02-13 DIAGNOSIS — Z79899 Other long term (current) drug therapy: Secondary | ICD-10-CM

## 2018-02-13 DIAGNOSIS — Z8371 Family history of colonic polyps: Secondary | ICD-10-CM

## 2018-02-13 DIAGNOSIS — Z9119 Patient's noncompliance with other medical treatment and regimen: Secondary | ICD-10-CM | POA: Diagnosis not present

## 2018-02-13 DIAGNOSIS — Y92012 Bathroom of single-family (private) house as the place of occurrence of the external cause: Secondary | ICD-10-CM | POA: Diagnosis not present

## 2018-02-13 DIAGNOSIS — S72331A Displaced oblique fracture of shaft of right femur, initial encounter for closed fracture: Secondary | ICD-10-CM | POA: Diagnosis not present

## 2018-02-13 DIAGNOSIS — R600 Localized edema: Secondary | ICD-10-CM | POA: Diagnosis present

## 2018-02-13 DIAGNOSIS — J4520 Mild intermittent asthma, uncomplicated: Secondary | ICD-10-CM | POA: Diagnosis not present

## 2018-02-13 DIAGNOSIS — E669 Obesity, unspecified: Secondary | ICD-10-CM | POA: Diagnosis present

## 2018-02-13 DIAGNOSIS — Z9181 History of falling: Secondary | ICD-10-CM | POA: Diagnosis not present

## 2018-02-13 DIAGNOSIS — G40909 Epilepsy, unspecified, not intractable, without status epilepticus: Secondary | ICD-10-CM

## 2018-02-13 DIAGNOSIS — M2550 Pain in unspecified joint: Secondary | ICD-10-CM | POA: Diagnosis not present

## 2018-02-13 DIAGNOSIS — E785 Hyperlipidemia, unspecified: Secondary | ICD-10-CM | POA: Diagnosis not present

## 2018-02-13 DIAGNOSIS — Z7902 Long term (current) use of antithrombotics/antiplatelets: Secondary | ICD-10-CM

## 2018-02-13 DIAGNOSIS — Z853 Personal history of malignant neoplasm of breast: Secondary | ICD-10-CM

## 2018-02-13 DIAGNOSIS — Z7401 Bed confinement status: Secondary | ICD-10-CM | POA: Diagnosis not present

## 2018-02-13 DIAGNOSIS — S72454A Nondisplaced supracondylar fracture without intracondylar extension of lower end of right femur, initial encounter for closed fracture: Secondary | ICD-10-CM | POA: Diagnosis not present

## 2018-02-13 DIAGNOSIS — M25561 Pain in right knee: Secondary | ICD-10-CM | POA: Diagnosis present

## 2018-02-13 DIAGNOSIS — R279 Unspecified lack of coordination: Secondary | ICD-10-CM | POA: Diagnosis not present

## 2018-02-13 DIAGNOSIS — Z6838 Body mass index (BMI) 38.0-38.9, adult: Secondary | ICD-10-CM

## 2018-02-13 DIAGNOSIS — Z8673 Personal history of transient ischemic attack (TIA), and cerebral infarction without residual deficits: Secondary | ICD-10-CM

## 2018-02-13 DIAGNOSIS — E0800 Diabetes mellitus due to underlying condition with hyperosmolarity without nonketotic hyperglycemic-hyperosmolar coma (NKHHC): Secondary | ICD-10-CM | POA: Diagnosis not present

## 2018-02-13 DIAGNOSIS — I1310 Hypertensive heart and chronic kidney disease without heart failure, with stage 1 through stage 4 chronic kidney disease, or unspecified chronic kidney disease: Secondary | ICD-10-CM | POA: Diagnosis not present

## 2018-02-13 DIAGNOSIS — H409 Unspecified glaucoma: Secondary | ICD-10-CM | POA: Diagnosis present

## 2018-02-13 DIAGNOSIS — Z9012 Acquired absence of left breast and nipple: Secondary | ICD-10-CM

## 2018-02-13 DIAGNOSIS — J45909 Unspecified asthma, uncomplicated: Secondary | ICD-10-CM | POA: Diagnosis present

## 2018-02-13 DIAGNOSIS — Z8781 Personal history of (healed) traumatic fracture: Secondary | ICD-10-CM | POA: Diagnosis not present

## 2018-02-13 DIAGNOSIS — Z96653 Presence of artificial knee joint, bilateral: Secondary | ICD-10-CM | POA: Diagnosis present

## 2018-02-13 DIAGNOSIS — R457 State of emotional shock and stress, unspecified: Secondary | ICD-10-CM | POA: Diagnosis not present

## 2018-02-13 DIAGNOSIS — Z808 Family history of malignant neoplasm of other organs or systems: Secondary | ICD-10-CM | POA: Diagnosis not present

## 2018-02-13 DIAGNOSIS — Z823 Family history of stroke: Secondary | ICD-10-CM | POA: Diagnosis not present

## 2018-02-13 DIAGNOSIS — S72401A Unspecified fracture of lower end of right femur, initial encounter for closed fracture: Secondary | ICD-10-CM | POA: Diagnosis not present

## 2018-02-13 DIAGNOSIS — Z741 Need for assistance with personal care: Secondary | ICD-10-CM | POA: Diagnosis not present

## 2018-02-13 DIAGNOSIS — I5032 Chronic diastolic (congestive) heart failure: Secondary | ICD-10-CM | POA: Diagnosis not present

## 2018-02-13 DIAGNOSIS — S72401D Unspecified fracture of lower end of right femur, subsequent encounter for closed fracture with routine healing: Secondary | ICD-10-CM | POA: Diagnosis not present

## 2018-02-13 DIAGNOSIS — J45901 Unspecified asthma with (acute) exacerbation: Secondary | ICD-10-CM | POA: Diagnosis not present

## 2018-02-13 DIAGNOSIS — G20 Parkinson's disease: Secondary | ICD-10-CM | POA: Diagnosis present

## 2018-02-13 DIAGNOSIS — S72491A Other fracture of lower end of right femur, initial encounter for closed fracture: Secondary | ICD-10-CM | POA: Diagnosis not present

## 2018-02-13 DIAGNOSIS — F0280 Dementia in other diseases classified elsewhere without behavioral disturbance: Secondary | ICD-10-CM | POA: Diagnosis present

## 2018-02-13 DIAGNOSIS — E119 Type 2 diabetes mellitus without complications: Secondary | ICD-10-CM | POA: Diagnosis present

## 2018-02-13 DIAGNOSIS — Z820 Family history of epilepsy and other diseases of the nervous system: Secondary | ICD-10-CM

## 2018-02-13 DIAGNOSIS — D649 Anemia, unspecified: Secondary | ICD-10-CM | POA: Diagnosis not present

## 2018-02-13 DIAGNOSIS — R0602 Shortness of breath: Secondary | ICD-10-CM | POA: Diagnosis not present

## 2018-02-13 DIAGNOSIS — Z9689 Presence of other specified functional implants: Secondary | ICD-10-CM | POA: Diagnosis present

## 2018-02-13 DIAGNOSIS — S79911A Unspecified injury of right hip, initial encounter: Secondary | ICD-10-CM | POA: Diagnosis not present

## 2018-02-13 DIAGNOSIS — W1811XA Fall from or off toilet without subsequent striking against object, initial encounter: Secondary | ICD-10-CM | POA: Diagnosis present

## 2018-02-13 DIAGNOSIS — M1990 Unspecified osteoarthritis, unspecified site: Secondary | ICD-10-CM | POA: Diagnosis present

## 2018-02-13 DIAGNOSIS — J9601 Acute respiratory failure with hypoxia: Secondary | ICD-10-CM | POA: Diagnosis not present

## 2018-02-13 DIAGNOSIS — M5136 Other intervertebral disc degeneration, lumbar region: Secondary | ICD-10-CM | POA: Diagnosis present

## 2018-02-13 DIAGNOSIS — M6281 Muscle weakness (generalized): Secondary | ICD-10-CM | POA: Diagnosis not present

## 2018-02-13 DIAGNOSIS — E1122 Type 2 diabetes mellitus with diabetic chronic kidney disease: Secondary | ICD-10-CM | POA: Diagnosis not present

## 2018-02-13 DIAGNOSIS — E1149 Type 2 diabetes mellitus with other diabetic neurological complication: Secondary | ICD-10-CM

## 2018-02-13 DIAGNOSIS — E041 Nontoxic single thyroid nodule: Secondary | ICD-10-CM | POA: Diagnosis present

## 2018-02-13 DIAGNOSIS — J9611 Chronic respiratory failure with hypoxia: Secondary | ICD-10-CM | POA: Diagnosis not present

## 2018-02-13 DIAGNOSIS — R52 Pain, unspecified: Secondary | ICD-10-CM | POA: Diagnosis not present

## 2018-02-13 LAB — CBC WITH DIFFERENTIAL/PLATELET
ABS IMMATURE GRANULOCYTES: 0.04 10*3/uL (ref 0.00–0.07)
BASOS ABS: 0.1 10*3/uL (ref 0.0–0.1)
BASOS PCT: 1 %
EOS ABS: 0.7 10*3/uL — AB (ref 0.0–0.5)
Eosinophils Relative: 8 %
HCT: 33.8 % — ABNORMAL LOW (ref 36.0–46.0)
Hemoglobin: 9.9 g/dL — ABNORMAL LOW (ref 12.0–15.0)
IMMATURE GRANULOCYTES: 0 %
Lymphocytes Relative: 19 %
Lymphs Abs: 1.7 10*3/uL (ref 0.7–4.0)
MCH: 28.4 pg (ref 26.0–34.0)
MCHC: 29.3 g/dL — ABNORMAL LOW (ref 30.0–36.0)
MCV: 96.8 fL (ref 80.0–100.0)
Monocytes Absolute: 0.5 10*3/uL (ref 0.1–1.0)
Monocytes Relative: 6 %
NEUTROS PCT: 66 %
NRBC: 0 % (ref 0.0–0.2)
Neutro Abs: 6 10*3/uL (ref 1.7–7.7)
PLATELETS: 332 10*3/uL (ref 150–400)
RBC: 3.49 MIL/uL — AB (ref 3.87–5.11)
RDW: 14.1 % (ref 11.5–15.5)
WBC: 9 10*3/uL (ref 4.0–10.5)

## 2018-02-13 LAB — BASIC METABOLIC PANEL
ANION GAP: 8 (ref 5–15)
BUN: 17 mg/dL (ref 8–23)
CHLORIDE: 103 mmol/L (ref 98–111)
CO2: 30 mmol/L (ref 22–32)
Calcium: 8.7 mg/dL — ABNORMAL LOW (ref 8.9–10.3)
Creatinine, Ser: 1.4 mg/dL — ABNORMAL HIGH (ref 0.44–1.00)
GFR calc Af Amer: 39 mL/min — ABNORMAL LOW (ref >60)
GFR, EST NON AFRICAN AMERICAN: 33 mL/min — AB (ref >60)
Glucose, Bld: 103 mg/dL — ABNORMAL HIGH (ref 70–99)
POTASSIUM: 4.7 mmol/L (ref 3.5–5.1)
SODIUM: 141 mmol/L (ref 135–145)

## 2018-02-13 MED ORDER — INSULIN ASPART 100 UNIT/ML ~~LOC~~ SOLN
0.0000 [IU] | Freq: Three times a day (TID) | SUBCUTANEOUS | Status: DC
  Administered 2018-02-14: 2 [IU] via SUBCUTANEOUS
  Administered 2018-02-14: 1 [IU] via SUBCUTANEOUS

## 2018-02-13 MED ORDER — LATANOPROST 0.005 % OP SOLN
1.0000 [drp] | Freq: Every day | OPHTHALMIC | Status: DC
  Administered 2018-02-13 – 2018-02-15 (×3): 1 [drp] via OPHTHALMIC
  Filled 2018-02-13: qty 2.5

## 2018-02-13 MED ORDER — ERYTHROMYCIN 5 MG/GM OP OINT
1.0000 "application " | TOPICAL_OINTMENT | Freq: Two times a day (BID) | OPHTHALMIC | Status: DC
  Administered 2018-02-13 – 2018-02-16 (×6): 1 via OPHTHALMIC
  Filled 2018-02-13: qty 1

## 2018-02-13 MED ORDER — SODIUM CHLORIDE 0.9 % IV SOLN
INTRAVENOUS | Status: DC
  Administered 2018-02-13 – 2018-02-15 (×4): via INTRAVENOUS

## 2018-02-13 MED ORDER — LEVETIRACETAM 250 MG PO TABS
250.0000 mg | ORAL_TABLET | Freq: Two times a day (BID) | ORAL | Status: DC
  Administered 2018-02-13 – 2018-02-16 (×6): 250 mg via ORAL
  Filled 2018-02-13 (×6): qty 1

## 2018-02-13 MED ORDER — INSULIN ASPART 100 UNIT/ML ~~LOC~~ SOLN: 0.0000 [IU] | Freq: Every day | SUBCUTANEOUS | Status: DC

## 2018-02-13 MED ORDER — ONDANSETRON HCL 4 MG PO TABS: 4.0000 mg | ORAL_TABLET | Freq: Four times a day (QID) | ORAL | Status: DC | PRN

## 2018-02-13 MED ORDER — POTASSIUM CHLORIDE CRYS ER 20 MEQ PO TBCR
20.0000 meq | EXTENDED_RELEASE_TABLET | Freq: Two times a day (BID) | ORAL | Status: DC
  Administered 2018-02-13 – 2018-02-15 (×5): 20 meq via ORAL
  Filled 2018-02-13 (×5): qty 1

## 2018-02-13 MED ORDER — CALCIUM CARBONATE-VITAMIN D 500-200 MG-UNIT PO TABS
1.0000 | ORAL_TABLET | Freq: Every evening | ORAL | Status: DC
  Administered 2018-02-14 – 2018-02-15 (×2): 1 via ORAL
  Filled 2018-02-13 (×2): qty 1

## 2018-02-13 MED ORDER — FUROSEMIDE 40 MG PO TABS
40.0000 mg | ORAL_TABLET | Freq: Every day | ORAL | Status: DC
  Administered 2018-02-14: 40 mg via ORAL
  Filled 2018-02-13: qty 1

## 2018-02-13 MED ORDER — DONEPEZIL HCL 10 MG PO TABS
10.0000 mg | ORAL_TABLET | Freq: Every day | ORAL | Status: DC
  Administered 2018-02-13 – 2018-02-15 (×3): 10 mg via ORAL
  Filled 2018-02-13 (×3): qty 1

## 2018-02-13 MED ORDER — OXYCODONE-ACETAMINOPHEN 5-325 MG PO TABS
1.0000 | ORAL_TABLET | ORAL | Status: DC | PRN
  Administered 2018-02-14 – 2018-02-16 (×7): 1 via ORAL
  Filled 2018-02-13 (×7): qty 1

## 2018-02-13 MED ORDER — PRAVASTATIN SODIUM 20 MG PO TABS
10.0000 mg | ORAL_TABLET | Freq: Every day | ORAL | Status: DC
  Administered 2018-02-14 – 2018-02-15 (×2): 10 mg via ORAL
  Filled 2018-02-13 (×2): qty 1

## 2018-02-13 MED ORDER — FOLIC ACID 1 MG PO TABS
1.0000 mg | ORAL_TABLET | Freq: Every day | ORAL | Status: DC
  Administered 2018-02-14 – 2018-02-16 (×3): 1 mg via ORAL
  Filled 2018-02-13 (×3): qty 1

## 2018-02-13 MED ORDER — CLOPIDOGREL BISULFATE 75 MG PO TABS
75.0000 mg | ORAL_TABLET | Freq: Every day | ORAL | Status: DC
  Administered 2018-02-14 – 2018-02-16 (×3): 75 mg via ORAL
  Filled 2018-02-13 (×3): qty 1

## 2018-02-13 MED ORDER — FENTANYL CITRATE (PF) 100 MCG/2ML IJ SOLN
25.0000 ug | Freq: Once | INTRAMUSCULAR | Status: AC
  Administered 2018-02-13: 25 ug via INTRAVENOUS
  Filled 2018-02-13: qty 2

## 2018-02-13 MED ORDER — IPRATROPIUM-ALBUTEROL 0.5-2.5 (3) MG/3ML IN SOLN
3.0000 mL | Freq: Four times a day (QID) | RESPIRATORY_TRACT | Status: DC
  Administered 2018-02-13 – 2018-02-16 (×9): 3 mL via RESPIRATORY_TRACT
  Filled 2018-02-13 (×9): qty 3

## 2018-02-13 MED ORDER — ALBUTEROL SULFATE (2.5 MG/3ML) 0.083% IN NEBU
3.0000 mL | INHALATION_SOLUTION | Freq: Four times a day (QID) | RESPIRATORY_TRACT | Status: DC | PRN
  Administered 2018-02-14 (×2): 3 mL via RESPIRATORY_TRACT
  Filled 2018-02-13 (×2): qty 3

## 2018-02-13 MED ORDER — ONDANSETRON HCL 4 MG/2ML IJ SOLN: 4.0000 mg | Freq: Four times a day (QID) | INTRAMUSCULAR | Status: DC | PRN

## 2018-02-13 MED ORDER — ENOXAPARIN SODIUM 40 MG/0.4ML ~~LOC~~ SOLN
40.0000 mg | SUBCUTANEOUS | Status: DC
  Administered 2018-02-13 – 2018-02-15 (×3): 40 mg via SUBCUTANEOUS
  Filled 2018-02-13 (×3): qty 0.4

## 2018-02-13 MED ORDER — OXYCODONE-ACETAMINOPHEN 5-325 MG PO TABS
1.0000 | ORAL_TABLET | Freq: Once | ORAL | Status: AC
  Administered 2018-02-13: 1 via ORAL
  Filled 2018-02-13: qty 1

## 2018-02-13 MED ORDER — GABAPENTIN 300 MG PO CAPS
300.0000 mg | ORAL_CAPSULE | Freq: Three times a day (TID) | ORAL | Status: DC
  Administered 2018-02-13 – 2018-02-16 (×8): 300 mg via ORAL
  Filled 2018-02-13 (×8): qty 1

## 2018-02-13 MED ORDER — PREDNISOLONE ACETATE 1 % OP SUSP
1.0000 [drp] | Freq: Every day | OPHTHALMIC | Status: DC
  Administered 2018-02-13 – 2018-02-16 (×2): 1 [drp] via OPHTHALMIC
  Filled 2018-02-13: qty 5

## 2018-02-13 MED ORDER — FLUOXETINE HCL 20 MG PO CAPS
20.0000 mg | ORAL_CAPSULE | Freq: Every day | ORAL | Status: DC
  Administered 2018-02-14 – 2018-02-16 (×3): 20 mg via ORAL
  Filled 2018-02-13 (×3): qty 1

## 2018-02-13 MED ORDER — PANTOPRAZOLE SODIUM 40 MG PO TBEC
40.0000 mg | DELAYED_RELEASE_TABLET | Freq: Every day | ORAL | Status: DC
  Administered 2018-02-14 – 2018-02-16 (×3): 40 mg via ORAL
  Filled 2018-02-13 (×3): qty 1

## 2018-02-13 MED ORDER — FERROUS GLUCONATE 324 (38 FE) MG PO TABS
325.0000 mg | ORAL_TABLET | Freq: Every day | ORAL | Status: DC
  Administered 2018-02-14 – 2018-02-16 (×3): 325 mg via ORAL
  Filled 2018-02-13 (×3): qty 1

## 2018-02-13 MED ORDER — KETOROLAC TROMETHAMINE 15 MG/ML IJ SOLN
15.0000 mg | Freq: Four times a day (QID) | INTRAMUSCULAR | Status: DC | PRN
  Administered 2018-02-14 (×3): 15 mg via INTRAVENOUS
  Filled 2018-02-13 (×3): qty 1

## 2018-02-13 MED ORDER — LOSARTAN POTASSIUM 50 MG PO TABS
100.0000 mg | ORAL_TABLET | Freq: Every day | ORAL | Status: DC
  Administered 2018-02-14: 100 mg via ORAL
  Filled 2018-02-13: qty 2

## 2018-02-13 MED ORDER — AMLODIPINE BESYLATE 5 MG PO TABS
5.0000 mg | ORAL_TABLET | Freq: Every day | ORAL | Status: DC
  Administered 2018-02-14 – 2018-02-16 (×3): 5 mg via ORAL
  Filled 2018-02-13 (×3): qty 1

## 2018-02-13 MED ORDER — ALPRAZOLAM 0.25 MG PO TABS
0.2500 mg | ORAL_TABLET | Freq: Three times a day (TID) | ORAL | Status: DC
  Administered 2018-02-13 – 2018-02-16 (×8): 0.25 mg via ORAL
  Filled 2018-02-13 (×8): qty 1

## 2018-02-13 NOTE — H&P (Signed)
History and Physical   Tiffany Hartman PXT:[id number] DOB: February 25, 1927 DOA: 02/13/2018  Referring MD/NP/PA: Dr. Ralene Bathe  PCP: Rosita Fire, MD   Outpatient Specialists: None  Patient coming from: Home  Chief Complaint: Fall  HPI: Tiffany Hartman is a 85 y.o. female with medical history significant of dementia, asthma, hypertension, osteoarthritis status post bilateral total knee replacement, diabetes, Parkinson's disease, previous CVA and glaucoma who apparently was at home and walking around after coming from bathroom early morning without any support.  Patient sustained a fall.  She injured her knees.  She came to the ER where she was evaluated.  Patient is not a good historian but daughter at bedside.  She has not been compliant apparently with use of mobility devices..  Patient was always refused to listen.  Orthopedics consulted and fracture deemed nonsurgical.  Patient admitted to the medical service for pain control as well as supportive care.  Denied any numbness.  Denied any other injury.  ED Course: Temperature is 98.1 blood pressure 128/54 pulse 84 respiratory rate of 18 oxygen sat 100% room air.  X-ray of the right knee showed acute minimally displaced fracture involving the distal right femoral shaft in the area that was previously under good fixation.  X-ray of the hip showed moderate degenerative disease but no acute findings.  Orthopedic consulted and no surgery planned.  Patient therefore admitted for treatment.  Review of Systems: As per HPI otherwise 10 point review of systems negative.    Past Medical History:  Diagnosis Date  . Asthma   . Breast cancer ([place]) 02/20/83   LT mastectomy  . Bronchitis   . Dementia ([place])   . Diabetes mellitus ([place])   . Diverticulitis 2009  . Glaucoma   . HTN (hypertension)   . OA (osteoarthritis)   . Obesity 08/17/2014  . Parkinson disease ([place])   . Stroke ([place])   . Thyroid nodule    Dr Legrand Rams    Past Surgical History:  Procedure Laterality  Date  .  bilateral catracts    . ABDOMINAL HYSTERECTOMY     complete  . APPENDECTOMY    . EUS  09/10/2011   Procedure: UPPER ENDOSCOPIC ULTRASOUND (EUS) RADIAL;  Surgeon: Arta Silence, MD;  [facility];  Service: Endoscopy;  Laterality: N/A;  christina/ebp  . FINE NEEDLE ASPIRATION  09/10/2011   Procedure: FINE NEEDLE ASPIRATION (FNA) LINEAR;  Surgeon: Arta Silence, MD;  [facility];  Service: Endoscopy;  Laterality: N/A;  . FRACTURE SURGERY  2008    right and left femurs  . JOINT REPLACEMENT  1998/1999   knee boths  . KNEE SURGERY Right    TKA  . KNEE SURGERY Left    TKA  . KNEE SURGERY Right    Periprosthetic fracture /otif  . MASTECTOMY  02/20/1983   left   . PARS PLANA VITRECTOMY Right 06/06/2016   Procedure: VITRECTOMY WITH CULTURES AND INJECTION OF ANTIBIOTICS; ANTERIOR CHAMBER WASHOUT;  Surgeon: Jalene Mullet, MD;  Location: [place];  Service: Ophthalmology;  Laterality: Right;     reports that she has never smoked. She has never used smokeless tobacco. She reports that she does not drink alcohol or use drugs.  No Known Allergies  Family History  Problem Relation Age of Onset  . Huntington's disease Mother   . Stroke Father   . Colon polyps Daughter 75  . Throat cancer Son      Prior to Admission medications   Medication Sig Start Date End Date  Taking? Authorizing Provider  albuterol (PROVENTIL HFA;VENTOLIN HFA) 108 (90 Base) MCG/ACT inhaler Inhale 1-2 puffs into the lungs every 6 (six) hours as needed for wheezing or shortness of breath.    [provider]  ALPRAZolam Duanne Moron) 0.25 MG tablet Take 0.25 mg by mouth 3 (three) times daily.     [provider]  amLODipine (NORVASC) 5 MG tablet Take 5 mg by mouth daily.    [provider]  Calcium Carbonate-Vitamin D (CALCIUM + D) 600-200 MG-UNIT TABS Take 1 tablet by mouth every evening.     [provider]  clopidogrel (PLAVIX) 75 MG tablet Take 1 tablet (75 mg  total) by mouth daily with breakfast. 08/16/12   Rosita Fire, MD  donepezil (ARICEPT) 10 MG tablet Take 10 mg by mouth at bedtime.    [provider]  ferrous gluconate (FERGON) 325 MG tablet Take 325 mg by mouth daily with breakfast.    [provider]  FLUoxetine (PROZAC) 20 MG capsule Take 20 mg by mouth daily.    [provider]  folic acid (FOLVITE) 1 MG tablet Take 1 mg by mouth daily.    [provider]  furosemide (LASIX) 40 MG tablet Take 40 mg by mouth daily.     [provider]  gabapentin (NEURONTIN) 300 MG capsule Take 1 capsule (300 mg total) by mouth 3 (three) times daily. Patient taking differently: Take 300 mg by mouth 2 (two) times daily.  06/02/12   Carole Civil, MD  HYDROcodone-acetaminophen (NORCO/VICODIN) 5-325 MG tablet Take 1 tablet by mouth every 6 (six) hours as needed for moderate pain. 11/18/17   Carole Civil, MD  ipratropium-albuterol (DUONEB) 0.5-2.5 (3) MG/3ML SOLN Take 3 mLs by nebulization every 4 (four) hours. Patient taking differently: Take 3 mLs by nebulization 4 (four) times daily.  08/22/14   Rosita Fire, MD  latanoprost (XALATAN) 0.005 % ophthalmic solution Place 1 drop into both eyes at bedtime.    [provider]  levETIRAcetam (KEPPRA) 250 MG tablet Take 250 mg by mouth 2 (two) times daily.    [provider]  losartan (COZAAR) 100 MG tablet Take 100 mg by mouth daily.    [provider]  lovastatin (MEVACOR) 10 MG tablet Take 10 mg by mouth at bedtime.    [provider]  omeprazole (PRILOSEC) 20 MG capsule Take 20 mg by mouth 2 (two) times daily.     [provider]  potassium chloride SA (K-DUR,KLOR-CON) 20 MEQ tablet Take 20 mEq by mouth 2 (two) times daily.    [provider]  prednisoLONE acetate (PRED FORTE) 1 % ophthalmic suspension Place 1 drop into the right eye daily.  08/07/15   [provider]    Physical Exam: Vitals:     02/13/18 1608 02/13/18 1646 02/13/18 2004 02/13/18 2121  BP: (!) 159/71 (!) 128/58 (!) 159/61   Pulse: 84 65 73   Resp: 18 18    Temp: 98 F (36.7 C) 97.9 F (36.6 C) 98.1 F (36.7 C)   TempSrc:  Oral Oral   SpO2: 99% 100% 100% 98%      Constitutional: NAD, calm, comfortable, communicating without difficulty Vitals:   02/13/18 1608 02/13/18 1646 02/13/18 2004 02/13/18 2121  BP: (!) 159/71 (!) 128/58 (!) 159/61   Pulse: 84 65 73   Resp: 18 18    Temp: 98 F (36.7 C) 97.9 F (36.6 C) 98.1 F (36.7 C)   TempSrc:  Oral Oral  SpO2: 99% 100% 100% 98%   Eyes: PERRL, lids and conjunctivae normal ENMT: Mucous membranes are moist. Posterior pharynx clear of any exudate or lesions.Normal dentition.  Neck: normal, supple, no masses, no thyromegaly Respiratory: clear to auscultation bilaterally, no wheezing, no crackles. Normal respiratory effort. No accessory muscle use.  Cardiovascular: Regular rate and rhythm, no murmurs / rubs / gallops. No extremity edema. 2+ pedal pulses. No carotid bruits.  Abdomen: no tenderness, no masses palpated. No hepatosplenomegaly. Bowel sounds positive.  Musculoskeletal: Right knee is swollen and tender, difficulty with extension of the knee.  Visible scar for bilateral knee replacements. Normal muscle tone.  Skin: no rashes, lesions, ulcers. No induration Neurologic: CN 2-12 grossly intact. Sensation intact, DTR normal. Strength 5/5 in all 4.  Psychiatric: Impaired judgment and insight. Alert and oriented x 3. Normal mood.     Labs on Admission: I have personally reviewed following labs and imaging studies  CBC: Recent Labs  Lab 02/13/18 1839  WBC 9.0  NEUTROABS 6.0  HGB 9.9*  HCT 33.8*  MCV 96.8  PLT 161   Basic Metabolic Panel: Recent Labs  Lab 02/13/18 1839  NA 141  K 4.7  CL 103  CO2 30  GLUCOSE 103*  BUN 17  CREATININE 1.40*  CALCIUM 8.7*   GFR: CrCl cannot be calculated (Unknown ideal weight.). Liver Function  Tests: No results for input(s): AST, ALT, ALKPHOS, BILITOT, PROT, ALBUMIN in the last 168 hours. No results for input(s): LIPASE, AMYLASE in the last 168 hours. No results for input(s): AMMONIA in the last 168 hours. Coagulation Profile: No results for input(s): INR, PROTIME in the last 168 hours. Cardiac Enzymes: No results for input(s): CKTOTAL, CKMB, CKMBINDEX, TROPONINI in the last 168 hours. BNP (last 3 results) No results for input(s): PROBNP in the last 8760 hours. HbA1C: No results for input(s): HGBA1C in the last 72 hours. CBG: No results for input(s): GLUCAP in the last 168 hours. Lipid Profile: No results for input(s): CHOL, HDL, LDLCALC, TRIG, CHOLHDL, LDLDIRECT in the last 72 hours. Thyroid Function Tests: No results for input(s): TSH, T4TOTAL, FREET4, T3FREE, THYROIDAB in the last 72 hours. Anemia Panel: No results for input(s): VITAMINB12, FOLATE, FERRITIN, TIBC, IRON, RETICCTPCT in the last 72 hours. Urine analysis:    Component Value Date/Time   COLORURINE YELLOW 08/22/2014 1300   APPEARANCEUR CLEAR 08/22/2014 1300   LABSPEC 1.010 08/22/2014 1300   PHURINE 6.0 08/22/2014 1300   GLUCOSEU NEGATIVE 08/22/2014 1300   HGBUR NEGATIVE 08/22/2014 1300   BILIRUBINUR NEGATIVE 08/22/2014 1300   KETONESUR NEGATIVE 08/22/2014 1300   PROTEINUR NEGATIVE 08/22/2014 1300   UROBILINOGEN 0.2 08/22/2014 1300   NITRITE NEGATIVE 08/22/2014 1300   LEUKOCYTESUR NEGATIVE 08/22/2014 1300   Sepsis Labs: @LABRCNTIP (procalcitonin:4,lacticidven:4) )No results found for this or any previous visit (from the past 240 hour(s)).   Radiological Exams on Admission: Dg Knee Complete 4 Views Right  Addendum Date: 02/13/2018   ADDENDUM REPORT: 02/13/2018 16:46 ADDENDUM: Upon further review, there does appear to be an acute minimally displaced fracture involving the distal right femoral shaft in the area that that has previously undergone internal fixation. I discussed this finding with Dr. Owens Shark.  This finding is much more easily apparent on the femur series than the knee series. Electronically Signed   By: Marijo Conception, M.D.   On: 02/13/2018 16:46   Result Date: 02/13/2018 CLINICAL DATA:  Per EMS, pt complains of right knee pain since hyperextending knee when falling this morning. Pt states she  slid out of bed. No other injuries. 8/10 pain in right knee. EXAM: RIGHT KNEE - COMPLETE 4+ VIEW COMPARISON:  11/18/2017 FINDINGS: Remote knee arthroplasty and ORIF of the distal RIGHT femur. Hardware is intact. No acute fracture. Trace joint effusion. IMPRESSION: 1. No acute fracture. 2. Hardware intact. Electronically Signed: By: Nolon Nations M.D. On: 02/13/2018 15:07   Dg Hip Unilat W Or Wo Pelvis 2-3 Views Right  Result Date: 02/13/2018 CLINICAL DATA:  Right hip pain after fall today EXAM: DG HIP (WITH OR WITHOUT PELVIS) 2-3V RIGHT COMPARISON:  None. FINDINGS: There is no evidence of hip fracture or dislocation. Moderate narrowing right hip joint is noted with osteophyte formation. IMPRESSION: Moderate degenerative joint disease of the right hip. No acute abnormality seen. Electronically Signed   By: Marijo Conception, M.D.   On: 02/13/2018 16:37   Dg Femur Min 2 Views Right  Result Date: 02/13/2018 CLINICAL DATA:  Right knee pain after fall. EXAM: RIGHT FEMUR 2 VIEWS COMPARISON:  Radiographs of November 18, 2017. FINDINGS: Status post surgical internal fixation of old distal right femoral fracture. However, there does appear to be a new minimally displaced oblique fracture involving the distal right femoral shaft in this area. Status post right total knee arthroplasty. IMPRESSION: New minimally displaced oblique fracture seen involving the distal right femoral shaft in area that has previously undergone surgical internal fixation. Electronically Signed   By: Marijo Conception, M.D.   On: 02/13/2018 16:44      Assessment/Plan Principal Problem:   Femoral distal fracture (HCC) Active Problems:    DDD (degenerative disc disease), lumbar   Parkinsons disease ([place])   Seizure disorder (HCC)   Obesity   Diabetes ([place])   Asthma     #1 distal femur fracture: Minimally displaced.  Periprosthetic also.  We will admit the patient and initiate pain management.  Physical therapy and Occupational Therapy.  Orthopedic consulted and will follow and make further recommendations.  #2 status post fall: Gait abnormality probably from Parkinson's and previous surgery as well as dementia.  PT OT consultation.  Consider rehabilitation.  #3 diabetes: Blood sugar appears controlled.  Continue current regiment.  #4 seizure disorder: Continue home regimen.  #5 history of asthma: No exacerbation at the moment.  #6 Parkinson's disease: Appears to be at baseline.  No change in therapy.  #7 degenerative disc disease: Multiple sites.  She has osteoarthritis also.  Conservative measures only.  #8 hypertension: Continue home blood pressure medications.   DVT prophylaxis: Lovenox Code Status: Full code Family Communication: Daughter at bedside Disposition Plan: To be determined Consults called: Orthopedics Admission status: Inpatient  Severity of Illness: The appropriate patient status for this patient is INPATIENT. Inpatient status is judged to be reasonable and necessary in order to provide the required intensity of service to ensure the patient's safety. The patient's presenting symptoms, physical exam findings, and initial radiographic and laboratory data in the context of their chronic comorbidities is felt to place them at high risk for further clinical deterioration. Furthermore, it is not anticipated that the patient will be medically stable for discharge from the hospital within 2 midnights of admission. The following factors support the patient status of inpatient.   " The patient's presenting symptoms include fall with right lower extremity pain. " The worrisome physical exam findings include  tenderness and swelling of the right knee. " The initial radiographic and laboratory data are worrisome because of distal femur fracture. " The chronic co-morbidities include dementia and  Parkinson's disease.   * I certify that at the point of admission it is my clinical judgment that the patient will require inpatient hospital care spanning beyond 2 midnights from the point of admission due to high intensity of service, high risk for further deterioration and high frequency of surveillance required.Barbette Merino MD [facility] Pager [phone]  If 7PM-7AM, please contact night-coverage [email or web address] Password [facility]  02/13/2018, 10:55 PM

## 2018-02-13 NOTE — ED Provider Notes (Signed)
[facility] DEPT Provider Note   CSN: [id number] Arrival date & time: 02/13/18  1408     History   Chief Complaint Chief Complaint  Patient presents with  . Fall  . Knee Pain    HPI Tiffany Hartman is a 86 y.o. female.  The history is provided by the patient and medical records. No language interpreter was used.  Fall   Knee Pain   Tiffany Hartman is a 85 y.o. female  with a PMH as listed below including prior bilateral TKAs, prior stroke who presents to the Emergency Department complaining of right knee and hip pain.  Patient states that she went to use the bathroom early this morning around 5 or 6 AM.  Her leg felt very stiff.  She tried to get up and walk, but when she got up, her right knee gave out and she fell down on her right knee with her foot underneath her.  She took her home pain medication with moderate amount of improvement.  She does endorse some numbness from the top of her hip all the way down to her ankle.  She denies any numbness to the foot.  She denies any back pain.  She did not hit her head.  Pain is worse when she tries to ambulate or move the knee or hip.  She does ambulate with a walker at baseline, sometimes using a cane.  Past Medical History:  Diagnosis Date  . Asthma   . Breast cancer ([place]) 02/20/83   LT mastectomy  . Bronchitis   . Dementia ([place])   . Diabetes mellitus ([place])   . Diverticulitis 2009  . Glaucoma   . HTN (hypertension)   . OA (osteoarthritis)   . Obesity 08/17/2014  . Parkinson disease ([place])   . Stroke ([place])   . Thyroid nodule    Dr Legrand Rams    Patient Active Problem List   Diagnosis Date Noted  . Femoral distal fracture ([place]) 02/13/2018  . Asthma exacerbation 01/31/2015  . Asthma with status asthmaticus 01/31/2015  . Asthma   . Chest pain 08/17/2014  . Dyspnea 08/17/2014  . Obesity 08/17/2014  . Diabetes ([place]) 08/17/2014  . Dizziness 12/27/2013  . Stroke risk 12/27/2013  . Stroke-like episode  ([place]) 12/27/2013  . History of stroke 08/10/2012  . Parkinsons disease ([place]) 08/10/2012  . Left sided numbness 08/10/2012  . Left hemiparesis ([place]) 08/10/2012  . Dysarthria 08/10/2012  . Seizure disorder ([place]) 08/10/2012  . Other and unspecified hyperlipidemia 08/10/2012  . Sciatica neuralgia 06/02/2012  . DDD (degenerative disc disease), lumbar 06/02/2012  . Hip pain 06/02/2012  . Knee pain 06/02/2012  . Pes anserinus bursitis 06/02/2012  . Abdominal pain 09/01/2011  . SCIATICA 10/01/2009  . SHOULDER, ARTHRITIS, DEGEN./OSTEO 11/27/2008  . KNEE PAIN 07/26/2007  . SHOULDER PAIN 12/01/2006  . History of cardiovascular disorder 12/01/2006    Past Surgical History:  Procedure Laterality Date  .  bilateral catracts    . ABDOMINAL HYSTERECTOMY     complete  . APPENDECTOMY    . EUS  09/10/2011   Procedure: UPPER ENDOSCOPIC ULTRASOUND (EUS) RADIAL;  Surgeon: Arta Silence, MD;  Location: [facility] ENDOSCOPY;  Service: Endoscopy;  Laterality: N/A;  christina/ebp  . FINE NEEDLE ASPIRATION  09/10/2011   Procedure: FINE NEEDLE ASPIRATION (FNA) LINEAR;  Surgeon: Arta Silence, MD;  Location: [facility];  Service: Endoscopy;  Laterality: N/A;  . FRACTURE SURGERY  2008    right and left femurs  .  JOINT REPLACEMENT  1998/1999   knee boths  . KNEE SURGERY Right    TKA  . KNEE SURGERY Left    TKA  . KNEE SURGERY Right    Periprosthetic fracture /otif  . MASTECTOMY  02/20/1983   left   . PARS PLANA VITRECTOMY Right 06/06/2016   Procedure: VITRECTOMY WITH CULTURES AND INJECTION OF ANTIBIOTICS; ANTERIOR CHAMBER WASHOUT;  Surgeon: Jalene Mullet, MD;  Location: [facility];  Service: Ophthalmology;  Laterality: Right;     OB History   No obstetric history on file.      Home Medications    Prior to Admission medications   Medication Sig Start Date End Date Taking? Authorizing Provider  albuterol (PROVENTIL HFA;VENTOLIN HFA) 108 (90 Base) MCG/ACT inhaler Inhale 1-2 puffs into the lungs every 6  (six) hours as needed for wheezing or shortness of breath.   Yes [provider]  ALPRAZolam (XANAX) 0.25 MG tablet Take 0.25 mg by mouth 3 (three) times daily.    Yes [provider]  amLODipine (NORVASC) 5 MG tablet Take 5 mg by mouth daily.   Yes [provider]  Calcium Carbonate-Vitamin D (CALCIUM + D) 600-200 MG-UNIT TABS Take 1 tablet by mouth every evening.    Yes [provider]  clopidogrel (PLAVIX) 75 MG tablet Take 1 tablet (75 mg total) by mouth daily with breakfast. 08/16/12  Yes Fanta, Tesfaye, MD  donepezil (ARICEPT) 10 MG tablet Take 10 mg by mouth at bedtime.   Yes [provider]  erythromycin ophthalmic ointment Place 1 application into the right eye 2 (two) times daily.   Yes [provider]  ferrous gluconate (FERGON) 325 MG tablet Take 325 mg by mouth daily with breakfast.   Yes [provider]  FLUoxetine (PROZAC) 20 MG capsule Take 20 mg by mouth daily.   Yes [provider]  folic acid (FOLVITE) 1 MG tablet Take 1 mg by mouth daily.   Yes [provider]  furosemide (LASIX) 40 MG tablet Take 40 mg by mouth daily.    Yes [provider]  gabapentin (NEURONTIN) 300 MG capsule Take 1 capsule (300 mg total) by mouth 3 (three) times daily. 06/02/12  Yes Carole Civil, MD  HYDROcodone-acetaminophen (NORCO/VICODIN) 5-325 MG tablet Take 1 tablet by mouth every 6 (six) hours as needed for moderate pain. 11/18/17  Yes Carole Civil, MD  ipratropium-albuterol (DUONEB) 0.5-2.5 (3) MG/3ML SOLN Take 3 mLs by nebulization every 4 (four) hours. Patient taking differently: Take 3 mLs by nebulization 4 (four) times daily.  08/22/14  Yes Fanta, Brandon Melnick, MD  latanoprost (XALATAN) 0.005 % ophthalmic solution Place 1 drop into both eyes at bedtime.   Yes [provider]  levETIRAcetam (KEPPRA) 250 MG tablet Take 250 mg by mouth 2 (two) times daily.   Yes [provider]  losartan  (COZAAR) 100 MG tablet Take 100 mg by mouth daily.   Yes [provider]  lovastatin (MEVACOR) 10 MG tablet Take 10 mg by mouth at bedtime.   Yes [provider]  omeprazole (PRILOSEC) 20 MG capsule Take 20 mg by mouth 2 (two) times daily.    Yes [provider]  potassium chloride SA (K-DUR,KLOR-CON) 20 MEQ tablet Take 20 mEq by mouth 2 (two) times daily.   Yes [provider]  prednisoLONE acetate (PRED FORTE) 1 % ophthalmic suspension Place 1 drop into the left eye daily.  08/07/15  Yes [provider]    Family History Family  History  Problem Relation Age of Onset  . Huntington's disease Mother   . Stroke Father   . Colon polyps Daughter 39  . Throat cancer Son     Social History Social History   Tobacco Use  . Smoking status: Never Smoker  . Smokeless tobacco: Never Used  Substance Use Topics  . Alcohol use: No  . Drug use: No     Allergies   Patient has no known allergies.   Review of Systems Review of Systems  Musculoskeletal: Positive for arthralgias and myalgias.  All other systems reviewed and are negative.    Physical Exam Updated Vital Signs BP (!) 128/58 (BP Location: Right Arm)   Pulse 65   Temp 97.9 F (36.6 C) (Oral)   Resp 18   SpO2 100%   Physical Exam Vitals signs and nursing note reviewed.  Constitutional:      General: She is not in acute distress.    Appearance: She is well-developed.  HENT:     Head: Normocephalic and atraumatic.  Neck:     Musculoskeletal: Neck supple.  Cardiovascular:     Rate and Rhythm: Normal rate and regular rhythm.     Heart sounds: Normal heart sounds. No murmur.  Pulmonary:     Effort: Pulmonary effort is normal. No respiratory distress.     Breath sounds: Normal breath sounds.  Abdominal:     General: There is no distension.     Palpations: Abdomen is soft.     Tenderness: There is no abdominal tenderness.  Musculoskeletal:     Comments: No C/T/L spine  tenderness.  Tenderness diffusely to the right hip, thigh and knee.  Decreased range of motion to the knee and hip as well which is likely secondary to her pain. 2+ DP.  Sensation intact.  Wiggles all toes without difficulty.  Movement of the ankle joint fine.  Skin:    General: Skin is warm and dry.  Neurological:     Mental Status: She is alert and oriented to person, place, and time.      ED Treatments / Results  Labs (all labs ordered are listed, but only abnormal results are displayed) Labs Reviewed  CBC WITH DIFFERENTIAL/PLATELET - Abnormal; Notable for the following components:      Result Value   RBC 3.49 (*)    Hemoglobin 9.9 (*)    HCT 33.8 (*)    MCHC 29.3 (*)    Eosinophils Absolute 0.7 (*)    All other components within normal limits  BASIC METABOLIC PANEL - Abnormal; Notable for the following components:   Glucose, Bld 103 (*)    Creatinine, Ser 1.40 (*)    Calcium 8.7 (*)    GFR calc non Af Amer 33 (*)    GFR calc Af Amer 39 (*)    All other components within normal limits    EKG None  Radiology Dg Knee Complete 4 Views Right  Addendum Date: 02/13/2018   ADDENDUM REPORT: 02/13/2018 16:46 ADDENDUM: Upon further review, there does appear to be an acute minimally displaced fracture involving the distal right femoral shaft in the area that that has previously undergone internal fixation. I discussed this finding with Dr. Owens Shark. This finding is much more easily apparent on the femur series than the knee series. Electronically Signed   By: Marijo Conception, M.D.   On: 02/13/2018 16:46   Result Date: 02/13/2018 CLINICAL DATA:  Per EMS, pt complains of right knee pain since hyperextending knee  when falling this morning. Pt states she slid out of bed. No other injuries. 8/10 pain in right knee. EXAM: RIGHT KNEE - COMPLETE 4+ VIEW COMPARISON:  11/18/2017 FINDINGS: Remote knee arthroplasty and ORIF of the distal RIGHT femur. Hardware is intact. No acute fracture. Trace joint  effusion. IMPRESSION: 1. No acute fracture. 2. Hardware intact. Electronically Signed: By: Nolon Nations M.D. On: 02/13/2018 15:07   Dg Hip Unilat W Or Wo Pelvis 2-3 Views Right  Result Date: 02/13/2018 CLINICAL DATA:  Right hip pain after fall today EXAM: DG HIP (WITH OR WITHOUT PELVIS) 2-3V RIGHT COMPARISON:  None. FINDINGS: There is no evidence of hip fracture or dislocation. Moderate narrowing right hip joint is noted with osteophyte formation. IMPRESSION: Moderate degenerative joint disease of the right hip. No acute abnormality seen. Electronically Signed   By: Marijo Conception, M.D.   On: 02/13/2018 16:37   Dg Femur Min 2 Views Right  Result Date: 02/13/2018 CLINICAL DATA:  Right knee pain after fall. EXAM: RIGHT FEMUR 2 VIEWS COMPARISON:  Radiographs of November 18, 2017. FINDINGS: Status post surgical internal fixation of old distal right femoral fracture. However, there does appear to be a new minimally displaced oblique fracture involving the distal right femoral shaft in this area. Status post right total knee arthroplasty. IMPRESSION: New minimally displaced oblique fracture seen involving the distal right femoral shaft in area that has previously undergone surgical internal fixation. Electronically Signed   By: Marijo Conception, M.D.   On: 02/13/2018 16:44    Procedures Procedures (including critical care time)  Medications Ordered in ED Medications  oxyCODONE-acetaminophen (PERCOCET/ROXICET) 5-325 MG per tablet 1 tablet (1 tablet Oral Given 02/13/18 1531)  fentaNYL (SUBLIMAZE) injection 25 mcg (25 mcg Intravenous Given 02/13/18 1838)     Initial Impression / Assessment and Plan / ED Course  I have reviewed the triage vital signs and the nursing notes.  Pertinent labs & imaging results that were available during my care of the patient were reviewed by me and considered in my medical decision making (see chart for details).    Tiffany Hartman is a 85 y.o. female who presents to ED for  acute onset of right knee, thigh and hip pain after fall this morning.  Does endorse numbness from the hip down to the ankle, but appears to have good sensation on exam.  2+ DP.  Able to wiggle toes without any difficulty.  Does have diffuse tenderness to the proximal right upper extremity and decreased range of motion.  She is unable to bear any weight or ambulate.  X-rays obtained showing new minimally displaced oblique fracture involving the distal right femoral shaft in the area that has undergone previous surgical internal fixation.  Discussed case with on-call orthopedist, Dr. Wynelle Link.  Upon initial review of films, he does not believe surgical intervention is needed at this time.  Will see patient in consultation.  Hospitalist consulted who will admit.  Patient seen by and discussed with Dr. Ralene Bathe who agrees with treatment plan.   Final Clinical Impressions(s) / ED Diagnoses   Final diagnoses:  Closed fracture of distal end of right femur, unspecified fracture morphology, initial encounter [facility])    ED Discharge Orders    None       Selden Noteboom, Ozella Almond, PA-C 02/13/18 1934    Quintella Reichert, MD 02/14/18 1558

## 2018-02-13 NOTE — ED Triage Notes (Signed)
Per EMS, pt complains of right knee pain since hyperextending knee when falling this morning. Pt states she slid out of bed. No other injuries. 8/10 pain in right knee. Pt took vicodin w/o relief. Pt denies loss of consciousness, is not on blood thinners. A&O at baseline.

## 2018-02-13 NOTE — ED Notes (Signed)
Bed: OI78 Expected date: 02/13/18 Expected time:  Means of arrival:  Comments: 85 year old fall knee pain

## 2018-02-13 NOTE — ED Notes (Signed)
ED TO INPATIENT HANDOFF REPORT  Name/Age/Gender Tiffany Hartman 85 y.o. female  Code Status Code Status History    Date Active Date Inactive Code Status Order ID Comments User Context   04/07/2016 0108 04/08/2016 1350 Full Code [id number]  Orvan Falconer, MD Inpatient   01/31/2015 0334 02/03/2015 1703 Full Code [id number]  Phillips Grout, MD Inpatient   08/17/2014 1824 08/22/2014 2040 Full Code [id number]  Doree Albee, MD Inpatient   08/10/2012 1747 08/16/2012 1355 Full Code [id number]  Kathie Dike, MD Inpatient      Home/SNF/Other Home  Chief Complaint fall  Level of Care/Admitting Diagnosis ED Disposition    ED Disposition Condition [facility] Hospital Area: [facility] [[id number]]  Level of Care: Med-Surg [16]  Diagnosis: Femoral distal fracture [facility]) [[id number]]  Admitting Physician: Elwyn Reach [[id number]]  Attending Physician: Elwyn Reach [[id number]]  Estimated length of stay: past midnight tomorrow  Certification:: I certify this patient will need inpatient services for at least 2 midnights  PT Class (Do Not Modify): Inpatient [101]  PT Acc Code (Do Not Modify): Private [1]       Medical History Past Medical History:  Diagnosis Date  . Asthma   . Breast cancer ([place]) 02/20/83   LT mastectomy  . Bronchitis   . Dementia ([place])   . Diabetes mellitus ([place])   . Diverticulitis 2009  . Glaucoma   . HTN (hypertension)   . OA (osteoarthritis)   . Obesity 08/17/2014  . Parkinson disease ([place])   . Stroke ([place])   . Thyroid nodule    Dr Legrand Rams    Allergies No Known Allergies  IV Location/Drains/Wounds Patient Lines/Drains/Airways Status   Active Line/Drains/Airways    Name:   Placement date:   Placement time:   Site:   Days:   Incision (Closed) 06/06/16 Eye   06/06/16    2044     617   Wound 08/11/12 Skin tear Arm Right;Anterior   08/11/12    2200    Arm   2012          Labs/Imaging No results found for this or any previous visit (from the past  48 hour(s)). Dg Knee Complete 4 Views Right  Addendum Date: 02/13/2018   ADDENDUM REPORT: 02/13/2018 16:46 ADDENDUM: Upon further review, there does appear to be an acute minimally displaced fracture involving the distal right femoral shaft in the area that that has previously undergone internal fixation. I discussed this finding with Dr. Owens Shark. This finding is much more easily apparent on the femur series than the knee series. Electronically Signed   By: Marijo Conception, M.D.   On: 02/13/2018 16:46   Result Date: 02/13/2018 CLINICAL DATA:  Per EMS, pt complains of right knee pain since hyperextending knee when falling this morning. Pt states she slid out of bed. No other injuries. 8/10 pain in right knee. EXAM: RIGHT KNEE - COMPLETE 4+ VIEW COMPARISON:  11/18/2017 FINDINGS: Remote knee arthroplasty and ORIF of the distal RIGHT femur. Hardware is intact. No acute fracture. Trace joint effusion. IMPRESSION: 1. No acute fracture. 2. Hardware intact. Electronically Signed: By: Nolon Nations M.D. On: 02/13/2018 15:07   Dg Hip Unilat W Or Wo Pelvis 2-3 Views Right  Result Date: 02/13/2018 CLINICAL DATA:  Right hip pain after fall today EXAM: DG HIP (WITH OR WITHOUT PELVIS) 2-3V RIGHT COMPARISON:  None. FINDINGS: There is no evidence of hip fracture or dislocation. Moderate narrowing right hip joint  is noted with osteophyte formation. IMPRESSION: Moderate degenerative joint disease of the right hip. No acute abnormality seen. Electronically Signed   By: Marijo Conception, M.D.   On: 02/13/2018 16:37   Dg Femur Min 2 Views Right  Result Date: 02/13/2018 CLINICAL DATA:  Right knee pain after fall. EXAM: RIGHT FEMUR 2 VIEWS COMPARISON:  Radiographs of November 18, 2017. FINDINGS: Status post surgical internal fixation of old distal right femoral fracture. However, there does appear to be a new minimally displaced oblique fracture involving the distal right femoral shaft in this area. Status post right total knee  arthroplasty. IMPRESSION: New minimally displaced oblique fracture seen involving the distal right femoral shaft in area that has previously undergone surgical internal fixation. Electronically Signed   By: Marijo Conception, M.D.   On: 02/13/2018 16:44   None  Pending Labs Unresulted Labs (From admission, onward)    Start     Ordered   02/13/18 1753  CBC with Differential  Once,   STAT     02/13/18 1753   02/13/18 8101  Basic metabolic panel  Once,   STAT     02/13/18 1753   Signed and Held  CBC  (enoxaparin (LOVENOX)    CrCl >/= 30 ml/min)  Once,   R    Comments:  Baseline for enoxaparin therapy IF NOT ALREADY DRAWN.  Notify MD if PLT < 100 K.    Signed and Held   Signed and Held  Creatinine, serum  (enoxaparin (LOVENOX)    CrCl >/= 30 ml/min)  Once,   R    Comments:  Baseline for enoxaparin therapy IF NOT ALREADY DRAWN.    Signed and Held   Signed and Held  Creatinine, serum  (enoxaparin (LOVENOX)    CrCl >/= 30 ml/min)  Weekly,   R    Comments:  while on enoxaparin therapy    Signed and Held   Signed and Held  Comprehensive metabolic panel  Tomorrow morning,   R     Signed and Held   Signed and Held  CBC  Tomorrow morning,   R     Signed and Held          Vitals/Pain Today's Vitals   02/13/18 1417 02/13/18 1608 02/13/18 1633 02/13/18 1646  BP:  (!) 159/71  (!) 128/58  Pulse:  84  65  Resp:  18  18  Temp:  98 F (36.7 C)  97.9 F (36.6 C)  TempSrc:    Oral  SpO2:  99%  100%  PainSc: 8   8      Isolation Precautions No active isolations  Medications Medications  oxyCODONE-acetaminophen (PERCOCET/ROXICET) 5-325 MG per tablet 1 tablet (1 tablet Oral Given 02/13/18 1531)  fentaNYL (SUBLIMAZE) injection 25 mcg (25 mcg Intravenous Given 02/13/18 1838)    Mobility non-ambulatory

## 2018-02-14 DIAGNOSIS — M5136 Other intervertebral disc degeneration, lumbar region: Secondary | ICD-10-CM

## 2018-02-14 DIAGNOSIS — E0800 Diabetes mellitus due to underlying condition with hyperosmolarity without nonketotic hyperglycemic-hyperosmolar coma (NKHHC): Secondary | ICD-10-CM

## 2018-02-14 DIAGNOSIS — S72401D Unspecified fracture of lower end of right femur, subsequent encounter for closed fracture with routine healing: Secondary | ICD-10-CM

## 2018-02-14 LAB — GLUCOSE, CAPILLARY
GLUCOSE-CAPILLARY: 125 mg/dL — AB (ref 70–99)
Glucose-Capillary: 132 mg/dL — ABNORMAL HIGH (ref 70–99)
Glucose-Capillary: 156 mg/dL — ABNORMAL HIGH (ref 70–99)
Glucose-Capillary: 111 mg/dL — ABNORMAL HIGH (ref 70–99)

## 2018-02-14 LAB — CBC
HCT: 31.3 % — ABNORMAL LOW (ref 36.0–46.0)
Hemoglobin: 9 g/dL — ABNORMAL LOW (ref 12.0–15.0)
MCH: 28.3 pg (ref 26.0–34.0)
MCHC: 28.8 g/dL — ABNORMAL LOW (ref 30.0–36.0)
MCV: 98.4 fL (ref 80.0–100.0)
NRBC: 0 % (ref 0.0–0.2)
Platelets: 300 10*3/uL (ref 150–400)
RBC: 3.18 MIL/uL — AB (ref 3.87–5.11)
RDW: 14.2 % (ref 11.5–15.5)
WBC: 7.3 10*3/uL (ref 4.0–10.5)

## 2018-02-14 LAB — COMPREHENSIVE METABOLIC PANEL
ALT: 10 U/L (ref 0–44)
AST: 10 U/L — ABNORMAL LOW (ref 15–41)
Albumin: 2.9 g/dL — ABNORMAL LOW (ref 3.5–5.0)
Alkaline Phosphatase: 55 U/L (ref 38–126)
Anion gap: 6 (ref 5–15)
BUN: 22 mg/dL (ref 8–23)
CO2: 30 mmol/L (ref 22–32)
Calcium: 8.5 mg/dL — ABNORMAL LOW (ref 8.9–10.3)
Chloride: 105 mmol/L (ref 98–111)
Creatinine, Ser: 1.48 mg/dL — ABNORMAL HIGH (ref 0.44–1.00)
GFR calc non Af Amer: 31 mL/min — ABNORMAL LOW (ref >60)
GFR, EST AFRICAN AMERICAN: 36 mL/min — AB (ref >60)
Glucose, Bld: 130 mg/dL — ABNORMAL HIGH (ref 70–99)
Potassium: 4.6 mmol/L (ref 3.5–5.1)
SODIUM: 141 mmol/L (ref 135–145)
Total Bilirubin: 0.5 mg/dL (ref 0.3–1.2)
Total Protein: 5.5 g/dL — ABNORMAL LOW (ref 6.5–8.1)

## 2018-02-14 MED ORDER — DOCUSATE SODIUM 100 MG PO CAPS: 100.0000 mg | ORAL_CAPSULE | Freq: Every day | ORAL | Status: DC | PRN

## 2018-02-14 NOTE — Progress Notes (Signed)
Pt stable with family at bedside. No s/s of distress at time of bedside report. Pt medicated for pain.

## 2018-02-14 NOTE — Progress Notes (Signed)
PROGRESS NOTE  Tiffany Hartman TMH:[id number] DOB: 20-Jun-1927 DOA: 02/13/2018 PCP: Rosita Fire, MD  HPI/Recap of past 24 hours: Tiffany Hartman is an 85 year old female with medical history of dementia asthma, hypertension, osteoarthritis, status post bilateral total knee replacement, diabetes hypertension Parkinson's disease and previous CVA.  She sustained a fall with injury to her knee right she was brought to the ER for evaluation.  Subjective.  Patient is doing well she says she has no pain now.  Assessment/Plan: Principal Problem:   Femoral distal fracture (HCC) Active Problems:   DDD (degenerative disc disease), lumbar   Parkinsons disease (Tiffany Hartman)   Seizure disorder (Tiffany Hartman)   Obesity   Diabetes ([place])   Asthma #1 fall at home 2.  Right knee pain continue anti-inflammatories 3.  Osteoarthritis status post knee replacement continue anti-inflammatories 4.  Right side supracondylar femoral fracture nondisplaced and protected by plates.  Dr. Wynelle Link orthopedic has evaluated patient and did not recommend any surgical intervention at this time he recommended physical therapy and pain control possibly rehab.  Code Status: Full  Severity of Illness: The appropriate patient status for this patient is INPATIENT. Inpatient status is judged to be reasonable and necessary in order to provide the required intensity of service to ensure the patient's safety. The patient's presenting symptoms, physical exam findings, and initial radiographic and laboratory data in the context of their chronic comorbidities is felt to place them at high risk for further clinical deterioration. Furthermore, it is not anticipated that the patient will be medically stable for discharge from the hospital within 2 midnights of admission. The following factors support the patient status of inpatient.   " The patient's presenting symptoms include fall with knee pain status post knee replacement with plate. " The worrisome physical  exam findings include tenderness and swelling of the knee. " The initial radiographic and laboratory data are worrisome because of supracondylar fracture. " The chronic co-morbidities include dementia degenerative disease Parkinson's disease.   * I certify that at the point of admission it is my clinical judgment that the patient will require inpatient hospital care spanning beyond 2 midnights from the point of admission due to high intensity of service, high risk for further deterioration and high frequency of surveillance required.*    Family Communication: None at bedside  Disposition Plan: Home when stable   Consultants: Orthopedic Tiffany Arabian, MD      Procedures:  None  Antimicrobials:  None  DVT prophylaxis: Lovenox   Objective: Vitals:   02/14/18 0240 02/14/18 0512 02/14/18 1020 02/14/18 1324  BP:  (!) 141/67  (!) 115/55  Pulse:  69  62  Resp:    17  Temp:  97.8 F (36.6 C)  97.7 F (36.5 C)  TempSrc:  Oral  Oral  SpO2: 99% 100% 98% 100%  Weight:      Height:        Intake/Output Summary (Last 24 hours) at 02/14/2018 1512 Last data filed at 02/14/2018 1300 Gross per 24 hour  Intake 867.63 ml  Output 600 ml  Net 267.63 ml   Filed Weights   02/13/18 2004  Weight: 102.2 kg   Body mass index is 38.67 kg/m.  Exam:  . General: 85 y.o. year-old female well developed well nourished in no acute distress.  Alert and oriented x3. . Cardiovascular: Regular rate and rhythm with no rubs or gallops.  No thyromegaly or JVD noted.   Marland Kitchen Respiratory: Clear to auscultation with no wheezes or rales. Good inspiratory  effort. . Abdomen: Soft nontender nondistended with normal bowel sounds x4 quadrants. . Musculoskeletal: Trace lower extremity edema. 2/4 pulses in all 4 extremities. . Skin: No ulcerative lesions noted or rashes healed knee scars bilaterally.  Swelling and tenderness of the right knee, . Psychiatry: Mood is appropriate for condition and  setting    Data Reviewed: CBC: Recent Labs  Lab 02/13/18 1839 02/14/18 0435  WBC 9.0 7.3  NEUTROABS 6.0  --   HGB 9.9* 9.0*  HCT 33.8* 31.3*  MCV 96.8 98.4  PLT 332 914   Basic Metabolic Panel: Recent Labs  Lab 02/13/18 1839 02/14/18 0435  NA 141 141  K 4.7 4.6  CL 103 105  CO2 30 30  GLUCOSE 103* 130*  BUN 17 22  CREATININE 1.40* 1.48*  CALCIUM 8.7* 8.5*   GFR: Estimated Creatinine Clearance: 30.6 mL/min (A) (by C-G formula based on SCr of 1.48 mg/dL (H)). Liver Function Tests: Recent Labs  Lab 02/14/18 0435  AST 10*  ALT 10  ALKPHOS 55  BILITOT 0.5  PROT 5.5*  ALBUMIN 2.9*   No results for input(s): LIPASE, AMYLASE in the last 168 hours. No results for input(s): AMMONIA in the last 168 hours. Coagulation Profile: No results for input(s): INR, PROTIME in the last 168 hours. Cardiac Enzymes: No results for input(s): CKTOTAL, CKMB, CKMBINDEX, TROPONINI in the last 168 hours. BNP (last 3 results) No results for input(s): PROBNP in the last 8760 hours. HbA1C: No results for input(s): HGBA1C in the last 72 hours. CBG: Recent Labs  Lab 02/14/18 0234 02/14/18 0736 02/14/18 1156  GLUCAP 132* 125* 156*   Lipid Profile: No results for input(s): CHOL, HDL, LDLCALC, TRIG, CHOLHDL, LDLDIRECT in the last 72 hours. Thyroid Function Tests: No results for input(s): TSH, T4TOTAL, FREET4, T3FREE, THYROIDAB in the last 72 hours. Anemia Panel: No results for input(s): VITAMINB12, FOLATE, FERRITIN, TIBC, IRON, RETICCTPCT in the last 72 hours. Urine analysis:    Component Value Date/Time   COLORURINE YELLOW 08/22/2014 1300   APPEARANCEUR CLEAR 08/22/2014 1300   LABSPEC 1.010 08/22/2014 1300   PHURINE 6.0 08/22/2014 1300   GLUCOSEU NEGATIVE 08/22/2014 1300   HGBUR NEGATIVE 08/22/2014 1300   BILIRUBINUR NEGATIVE 08/22/2014 1300   KETONESUR NEGATIVE 08/22/2014 1300   PROTEINUR NEGATIVE 08/22/2014 1300   UROBILINOGEN 0.2 08/22/2014 1300   NITRITE NEGATIVE  08/22/2014 1300   LEUKOCYTESUR NEGATIVE 08/22/2014 1300   Sepsis Labs: @LABRCNTIP (procalcitonin:4,lacticidven:4)  )No results found for this or any previous visit (from the past 240 hour(s)).    Studies: Dg Hip Unilat W Or Wo Pelvis 2-3 Views Right  Result Date: 02/13/2018 CLINICAL DATA:  Right hip pain after fall today EXAM: DG HIP (WITH OR WITHOUT PELVIS) 2-3V RIGHT COMPARISON:  None. FINDINGS: There is no evidence of hip fracture or dislocation. Moderate narrowing right hip joint is noted with osteophyte formation. IMPRESSION: Moderate degenerative joint disease of the right hip. No acute abnormality seen. Electronically Signed   By: Marijo Conception, M.D.   On: 02/13/2018 16:37   Dg Femur Min 2 Views Right  Result Date: 02/13/2018 CLINICAL DATA:  Right knee pain after fall. EXAM: RIGHT FEMUR 2 VIEWS COMPARISON:  Radiographs of November 18, 2017. FINDINGS: Status post surgical internal fixation of old distal right femoral fracture. However, there does appear to be a new minimally displaced oblique fracture involving the distal right femoral shaft in this area. Status post right total knee arthroplasty. IMPRESSION: New minimally displaced oblique fracture seen involving the distal right  femoral shaft in area that has previously undergone surgical internal fixation. Electronically Signed   By: Marijo Conception, M.D.   On: 02/13/2018 16:44    Scheduled Meds: . ALPRAZolam  0.25 mg Oral TID  . amLODipine  5 mg Oral Daily  . calcium-vitamin D  1 tablet Oral QPM  . clopidogrel  75 mg Oral Q breakfast  . donepezil  10 mg Oral QHS  . enoxaparin (LOVENOX) injection  40 mg Subcutaneous Q24H  . erythromycin  1 application Right Eye BID  . ferrous gluconate  325 mg Oral Q breakfast  . FLUoxetine  20 mg Oral Daily  . folic acid  1 mg Oral Daily  . furosemide  40 mg Oral Daily  . gabapentin  300 mg Oral TID  . insulin aspart  0-5 Units Subcutaneous QHS  . insulin aspart  0-9 Units Subcutaneous TID  WC  . ipratropium-albuterol  3 mL Nebulization QID  . latanoprost  1 drop Both Eyes QHS  . levETIRAcetam  250 mg Oral BID  . losartan  100 mg Oral Daily  . pantoprazole  40 mg Oral Daily  . potassium chloride SA  20 mEq Oral BID  . pravastatin  10 mg Oral q1800  . prednisoLONE acetate  1 drop Left Eye Daily    Continuous Infusions: . sodium chloride 100 mL/hr at 02/14/18 0814     LOS: 1 day     Cristal Deer, MD [facility]  To reach me or the doctor on call, go to: [email or web address] Password [email or web address]  02/14/2018, 3:12 PM

## 2018-02-14 NOTE — Consult Note (Addendum)
Reason for Consult:Right femur fracture Referring Physician: Dr. [name] is an 85 y.o. female.  HPI: 85 yo female with previous history of right TKA who had a periprosthetic distal femur fracture in 2008 treated with ORIF at [facility]. She reported no knee problems until yesterday morning. She was getting up from a sitting position and felt that her right knee was stiff and then it gave out on her and she fell. She had a hard time getting up and was taken to the [facility] ED. She complained of right knee and thigh pain. She had no other complaints. She says she has "arthritis all over" and it is hard for her to get around but she still ambulates. Evaluation in the ED showed a possible non-displaced distl femur fracture just above the prosthesis but well within the area covered by the plate. She was admitted for pain control and rehab.  Past Medical History:  Diagnosis Date  . Asthma   . Breast cancer ([place]) 02/20/83   LT mastectomy  . Bronchitis   . Dementia ([place])   . Diabetes mellitus ([place])   . Diverticulitis 2009  . Glaucoma   . HTN (hypertension)   . OA (osteoarthritis)   . Obesity 08/17/2014  . Parkinson disease ([place])   . Stroke ([place])   . Thyroid nodule    Dr Legrand Rams    Past Surgical History:  Procedure Laterality Date  .  bilateral catracts    . ABDOMINAL HYSTERECTOMY     complete  . APPENDECTOMY    . EUS  09/10/2011   Procedure: UPPER ENDOSCOPIC ULTRASOUND (EUS) RADIAL;  Surgeon: Arta Silence, MD;  Location: [facility] END[facility];  Service: Endoscopy;  Laterality: N/A;  christina/ebp  . FINE NEEDLE ASPIRATION  09/10/2011   Procedure: FINE NEEDLE ASPIRATION (FNA) LINEAR;  Surgeon: Arta Silence, MD;  Location: [facility];  Service: Endoscopy;  Laterality: N/A;  . FRACTURE SURGERY  2008    right and left femurs  . JOINT REPLACEMENT  1998/1999   knee boths  . KNEE SURGERY Right    TKA  . KNEE SURGERY Left    TKA  . KNEE SURGERY Right    Periprosthetic fracture /otif  .  MASTECTOMY  02/20/1983   left   . PARS PLANA VITRECTOMY Right 06/06/2016   Procedure: VITRECTOMY WITH CULTURES AND INJECTION OF ANTIBIOTICS; ANTERIOR CHAMBER WASHOUT;  Surgeon: Jalene Mullet, MD;  Location: [facility];  Service: Ophthalmology;  Laterality: Right;    Family History  Problem Relation Age of Onset  . Huntington's disease Mother   . Stroke Father   . Colon polyps Daughter 110  . Throat cancer Son     Social History:  reports that she has never smoked. She has never used smokeless tobacco. She reports that she does not drink alcohol or use drugs.  Allergies: No Known Allergies  Medications: I have reviewed the patient's current medications.  Results for orders placed or performed during the hospital encounter of 02/13/18 (from the past 48 hour(s))  CBC with Differential     Status: Abnormal   Collection Time: 02/13/18  6:39 PM  Result Value Ref Range   WBC 9.0 4.0 - 10.5 K/uL   RBC 3.49 (L) 3.87 - 5.11 MIL/uL   Hemoglobin 9.9 (L) 12.0 - 15.0 g/dL   HCT 33.8 (L) 36.0 - 46.0 %   MCV 96.8 80.0 - 100.0 fL   MCH 28.4 26.0 - 34.0 pg   MCHC 29.3 (L) 30.0 - 36.0  g/dL   RDW 14.1 11.5 - 15.5 %   Platelets 332 150 - 400 K/uL   nRBC 0.0 0.0 - 0.2 %   Neutrophils Relative % 66 %   Neutro Abs 6.0 1.7 - 7.7 K/uL   Lymphocytes Relative 19 %   Lymphs Abs 1.7 0.7 - 4.0 K/uL   Monocytes Relative 6 %   Monocytes Absolute 0.5 0.1 - 1.0 K/uL   Eosinophils Relative 8 %   Eosinophils Absolute 0.7 (H) 0.0 - 0.5 K/uL   Basophils Relative 1 %   Basophils Absolute 0.1 0.0 - 0.1 K/uL   Immature Granulocytes 0 %   Abs Immature Granulocytes 0.04 0.00 - 0.07 K/uL    Comment: Performed at [facility], [place] [address] [place], [place] [address]  Basic metabolic panel     Status: Abnormal   Collection Time: 02/13/18  6:39 PM  Result Value Ref Range   Sodium 141 135 - 145 mmol/L   Potassium 4.7 3.5 - 5.1 mmol/L   Chloride 103 98 - 111 mmol/L   CO2 30 22 - 32 mmol/L    Glucose, Bld 103 (H) 70 - 99 mg/dL   BUN 17 8 - 23 mg/dL   Creatinine, Ser 1.40 (H) 0.44 - 1.00 mg/dL   Calcium 8.7 (L) 8.9 - 10.3 mg/dL   GFR calc non Af Amer 33 (L) >60 mL/min   GFR calc Af Amer 39 (L) >60 mL/min   Anion gap 8 5 - 15    Comment: Performed at [facility], [place] [address], [place], [place] [address]  Glucose, capillary     Status: Abnormal   Collection Time: 02/14/18  2:34 AM  Result Value Ref Range   Glucose-Capillary 132 (H) 70 - 99 mg/dL  Comprehensive metabolic panel     Status: Abnormal   Collection Time: 02/14/18  4:35 AM  Result Value Ref Range   Sodium 141 135 - 145 mmol/L   Potassium 4.6 3.5 - 5.1 mmol/L   Chloride 105 98 - 111 mmol/L   CO2 30 22 - 32 mmol/L   Glucose, Bld 130 (H) 70 - 99 mg/dL   BUN 22 8 - 23 mg/dL   Creatinine, Ser 1.48 (H) 0.44 - 1.00 mg/dL   Calcium 8.5 (L) 8.9 - 10.3 mg/dL   Total Protein 5.5 (L) 6.5 - 8.1 g/dL   Albumin 2.9 (L) 3.5 - 5.0 g/dL   AST 10 (L) 15 - 41 U/L   ALT 10 0 - 44 U/L   Alkaline Phosphatase 55 38 - 126 U/L   Total Bilirubin 0.5 0.3 - 1.2 mg/dL   GFR calc non Af Amer 31 (L) >60 mL/min   GFR calc Af Amer 36 (L) >60 mL/min   Anion gap 6 5 - 15    Comment: Performed at [facility], [place] [address], [place], [place] [address]  CBC     Status: Abnormal   Collection Time: 02/14/18  4:35 AM  Result Value Ref Range   WBC 7.3 4.0 - 10.5 K/uL   RBC 3.18 (L) 3.87 - 5.11 MIL/uL   Hemoglobin 9.0 (L) 12.0 - 15.0 g/dL   HCT 31.3 (L) 36.0 - 46.0 %   MCV 98.4 80.0 - 100.0 fL   MCH 28.3 26.0 - 34.0 pg   MCHC 28.8 (L) 30.0 - 36.0 g/dL   RDW 14.2 11.5 - 15.5 %   Platelets 300 150 - 400 K/uL   nRBC 0.0 0.0 -  0.2 %    Comment: Performed at [facility], [place] [address] [place], [place] [address]    Dg Knee Complete 4 Views Right  Addendum Date: 02/13/2018   ADDENDUM REPORT: 02/13/2018 16:46 ADDENDUM: Upon further review, there does appear to be an acute minimally  displaced fracture involving the distal right femoral shaft in the area that that has previously undergone internal fixation. I discussed this finding with Dr. Owens Shark. This finding is much more easily apparent on the femur series than the knee series. Electronically Signed   By: Marijo Conception, M.D.   On: 02/13/2018 16:46   Result Date: 02/13/2018 CLINICAL DATA:  Per EMS, pt complains of right knee pain since hyperextending knee when falling this morning. Pt states she slid out of bed. No other injuries. 8/10 pain in right knee. EXAM: RIGHT KNEE - COMPLETE 4+ VIEW COMPARISON:  11/18/2017 FINDINGS: Remote knee arthroplasty and ORIF of the distal RIGHT femur. Hardware is intact. No acute fracture. Trace joint effusion. IMPRESSION: 1. No acute fracture. 2. Hardware intact. Electronically Signed: By: Nolon Nations M.D. On: 02/13/2018 15:07   Dg Hip Unilat W Or Wo Pelvis 2-3 Views Right  Result Date: 02/13/2018 CLINICAL DATA:  Right hip pain after fall today EXAM: DG HIP (WITH OR WITHOUT PELVIS) 2-3V RIGHT COMPARISON:  None. FINDINGS: There is no evidence of hip fracture or dislocation. Moderate narrowing right hip joint is noted with osteophyte formation. IMPRESSION: Moderate degenerative joint disease of the right hip. No acute abnormality seen. Electronically Signed   By: Marijo Conception, M.D.   On: 02/13/2018 16:37   Dg Femur Min 2 Views Right  Result Date: 02/13/2018 CLINICAL DATA:  Right knee pain after fall. EXAM: RIGHT FEMUR 2 VIEWS COMPARISON:  Radiographs of November 18, 2017. FINDINGS: Status post surgical internal fixation of old distal right femoral fracture. However, there does appear to be a new minimally displaced oblique fracture involving the distal right femoral shaft in this area. Status post right total knee arthroplasty. IMPRESSION: New minimally displaced oblique fracture seen involving the distal right femoral shaft in area that has previously undergone surgical internal fixation.  Electronically Signed   By: Marijo Conception, M.D.   On: 02/13/2018 16:44    ROS Blood pressure (!) 141/67, pulse 69, temperature 97.8 F (36.6 C), temperature source Oral, resp. rate 18, height 5\' 4"  (1.626 m), weight 102.2 kg, SpO2 100 %. Physical Exam Physical Examination: General appearance - alert, well appearing, and in no distress Mental status - alert, oriented to person, place, and time Extremities - Right lower extremity- Non-tender about the hip; tender distal thigh just above knee but with minimal swelling; no effusion; pain on attempted knee motion; can wiggle toes and TA/Gastroc intact; Foot   warm; trace pulses; tender medial calf without swelling; Homan's negative  X-rays- Right Total Knee Arthroplasty in good position; Lateral distal femoral plate intact; Evidence of healed old supracondylar periprosthetic fracture; Oblique non-displaced incomplete fracture line in supracondylar area (well contained within confines of area protected by plate; hip films with mild OA; no fractures in femur above plate and no hip fracture   Assessment/Plan: Right supracondylar femur fracture- Non-displaced and protected by plate; stable injury which will not require surgical intervention; treatment of PT and pain control; may need rehab if she can not progress with therapy. May be 50% PWB RLE  Tiffany Hartman 02/14/2018, 7:07 AM

## 2018-02-14 NOTE — Evaluation (Signed)
Physical Therapy Evaluation Patient Details Name: Tiffany Hartman MRN: [id number] DOB: November 30, 1927 Today's Date: 02/14/2018   History of Present Illness  Tiffany Hartman is a 85 y.o. female with medical history significant of dementia, asthma, hypertension, osteoarthritis status post bilateral total knee replacement, diabetes, Parkinson's disease, previous CVA and glaucoma who apparently  sustained a fall.  resulting in right supracondylar fx, see by ortho (Dr. Wynelle Link) and recommended no surgical interverntion at this time as fx is stable    Clinical Impression  Pt admitted with above diagnosis. Pt currently with functional limitations due to the deficits listed below (see PT Problem List).  Recommend SNF, mobility significantly limited d/t pain  Pt will benefit from skilled PT to increase their independence and safety with mobility to allow discharge to the venue listed below.       Follow Up Recommendations SNF    Equipment Recommendations  None recommended by PT    Recommendations for Other Services       Precautions / Restrictions Precautions Precautions: Fall Restrictions Weight Bearing Restrictions: Yes RLE Weight Bearing: Partial weight bearing RLE Partial Weight Bearing Percentage or Pounds: 50%      Mobility  Bed Mobility Overal bed mobility: Needs Assistance Bed Mobility: Supine to Sit;Sit to Supine     Supine to sit: +2 for physical assistance;+2 for safety/equipment;Max assist Sit to supine: +2 for safety/equipment;+2 for physical assistance;Max assist   General bed mobility comments: assist with trunk, and LEs, bed pad utilized for positioning   Transfers Overall transfer level: Needs assistance   Transfers: Sit to/from Stand Sit to Stand: From elevated surface;Max assist;+2 physical assistance;+2 safety/equipment         General transfer comment: assist to rise and stabilize,pain limiting standing tolerance   Ambulation/Gait             General Gait  Details: unable d/t pain  Stairs            Wheelchair Mobility    Modified Rankin (Stroke Patients Only)       Balance Overall balance assessment: Needs assistance;History of Falls Sitting-balance support: Feet supported;No upper extremity supported Sitting balance-Leahy Scale: Fair       Standing balance-Leahy Scale: Poor Standing balance comment: heavily reliant on UEs                             Pertinent Vitals/Pain Pain Assessment: 0-10 Pain Score: (incr with mobility 8/10) Pain Location: right LE Pain Descriptors / Indicators: Aching Pain Intervention(s): Limited activity within patient's tolerance;Monitored during session    [place] expects to be discharged to:: Private residence(lives with dtr) Living Arrangements: Children Available Help at Discharge: Family Type of Home: House Home Access: Ramped entrance     Home Layout: One level Home Equipment: Environmental consultant - 2 wheels;Walker - 4 wheels;Wheelchair - [employer];Hospital bed Additional Comments: o2 DEPENDENT    Prior Function Level of Independence: Independent with assistive device(s)         Comments: amb with RW      Hand Dominance        Extremity/Trunk Assessment   Upper Extremity Assessment Upper Extremity Assessment: Generalized weakness    Lower Extremity Assessment Lower Extremity Assessment: LLE deficits/detail RLE: Unable to fully assess due to pain LLE Deficits / Details: knee flexion limited to ~ 75*, pt reports it is usually better       Communication   Communication: No difficulties  Cognition  Arousal/Alertness: Awake/alert Behavior During Therapy: WFL for tasks assessed/performed Overall Cognitive Status: History of cognitive impairments - at baseline                                 General Comments: hx of dementia,appropriate for purposes of PT eval      General Comments      Exercises      Assessment/Plan    PT Assessment Patient needs continued PT services  PT Problem List Decreased strength;Decreased range of motion;Decreased activity tolerance;Decreased balance;Decreased mobility;Pain;Decreased knowledge of use of DME       PT Treatment Interventions DME instruction;Gait training;Functional mobility training;Therapeutic activities;Patient/family education;Therapeutic exercise    PT Goals (Current goals can be found in the Care Plan section)  Acute Rehab PT Goals Patient Stated Goal: home  PT Goal Formulation: With patient Time For Goal Achievement: 02/28/18 Potential to Achieve Goals: Fair    Frequency Min 3X/week   Barriers to discharge        Co-evaluation               AM-PAC PT "6 Clicks" Mobility  Outcome Measure Help needed turning from your back to your side while in a flat bed without using bedrails?: A Lot Help needed moving from lying on your back to sitting on the side of a flat bed without using bedrails?: A Lot Help needed moving to and from a bed to a chair (including a wheelchair)?: Total Help needed standing up from a chair using your arms (e.g., wheelchair or bedside chair)?: Total Help needed to walk in hospital room?: Total Help needed climbing 3-5 steps with a railing? : Total 6 Click Score: 8    End of Session Equipment Utilized During Treatment: Gait belt Activity Tolerance: Patient limited by pain Patient left: with call bell/phone within reach;in bed;with bed alarm set;with family/visitor present   PT Visit Diagnosis: Difficulty in walking, not elsewhere classified (R26.2);History of falling (Z91.81)    Time: 1510-1540 PT Time Calculation (min) (ACUTE ONLY): 30 min   Charges:   PT Evaluation $PT Eval Low Complexity: 1 Low PT Treatments $Therapeutic Activity: 8-22 mins        Kenyon Ana, PT  Pager: [phone] Acute Rehab Dept [facility]): [phone]   02/14/2018   [facility] 02/14/2018, 4:54 PM

## 2018-02-15 LAB — GLUCOSE, CAPILLARY
Glucose-Capillary: 108 mg/dL — ABNORMAL HIGH (ref 70–99)
Glucose-Capillary: 102 mg/dL — ABNORMAL HIGH (ref 70–99)
Glucose-Capillary: 107 mg/dL — ABNORMAL HIGH (ref 70–99)

## 2018-02-15 LAB — INFLUENZA PANEL BY PCR (TYPE A & B)
Influenza A By PCR: NEGATIVE
Influenza B By PCR: NEGATIVE

## 2018-02-15 MED ORDER — BENZONATATE 100 MG PO CAPS
100.0000 mg | ORAL_CAPSULE | Freq: Three times a day (TID) | ORAL | Status: DC
  Administered 2018-02-15 – 2018-02-16 (×2): 100 mg via ORAL
  Filled 2018-02-15 (×2): qty 1

## 2018-02-15 MED ORDER — GUAIFENESIN 100 MG/5ML PO SOLN: 5.0000 mL | ORAL | Status: DC | PRN

## 2018-02-15 MED ORDER — DOCUSATE SODIUM 100 MG PO CAPS
100.0000 mg | ORAL_CAPSULE | Freq: Two times a day (BID) | ORAL | Status: DC
  Administered 2018-02-15 – 2018-02-16 (×3): 100 mg via ORAL
  Filled 2018-02-15 (×3): qty 1

## 2018-02-15 MED ORDER — LACTATED RINGERS IV SOLN
INTRAVENOUS | Status: DC
  Administered 2018-02-15: 09:00:00 via INTRAVENOUS

## 2018-02-15 MED ORDER — POLYETHYLENE GLYCOL 3350 17 G PO PACK
17.0000 g | PACK | Freq: Every day | ORAL | Status: DC
  Administered 2018-02-15 – 2018-02-16 (×2): 17 g via ORAL
  Filled 2018-02-15 (×2): qty 1

## 2018-02-15 NOTE — Clinical Social Work Note (Signed)
Clinical Social Work Assessment  Patient Details  Name: Tiffany Hartman MRN: [id number] Date of Birth: 04-07-1927  Date of referral:  02/15/18               Reason for consult:  Discharge Planning                Permission sought to share information with:    Permission granted to share information::  Yes, Verbal Permission Granted  Name::       Tiffany Hartman  Agency::   SNF  Relationship::   Daughter   Contact Information:    [phone], [phone]  Housing/Transportation Living arrangements for the past 2 months:  [place] of Information:  Patient Patient Interpreter Needed:  None Criminal Activity/Legal Involvement Pertinent to Current Situation/Hospitalization:  No - Comment as needed Significant Relationships:  Adult Children Lives with:  Adult Children Do you feel safe going back to the place where you live?  Yes Need for family participation in patient care:  Yes (Comment)  Care giving concerns:   Tiffany Hartman is a 85 y.o. female with medical history significant of dementia, asthma, hypertension, osteoarthritis status post bilateral total knee replacement, diabetes, Parkinson's disease, previous CVA and glaucoma who apparently was at home and walking around after coming from bathroom early morning without any support.  Patient sustained a fall.     Social Worker assessment / plan:  CSW met with the patient and her daughter Tiffany Hartman bedside. Patient is agreeable to SNF placement for rehab. Patient reports she has gone to SNF for rehab in the past and prefers [facility] if space is available.CSW inquired about the patient level of functioning at home. Patient reports she live in the home with her daughter Tiffany Hartman. She reports she uses a walker to ambulate, bathes and dresses her self. Patient reports she has fallen in the past and sustained a fracture but it was many years ago. Patient reports does not recall how she fell this time.  FL2  completed.  CSW reached out to [facility], sent clinical via [place]. Patient accepted.  Patient up with PT today.   Plan:SNF rehab.   Employment status:  Retired Forensic scientist:  Medicare PT Recommendations:  [place] / Referral to community resources:  [place]  Patient/Family's Response to care: Agreeable and Responding well to care.   Patient/Family's Understanding of and Emotional Response to Diagnosis, Current Treatment, and Prognosis:  Patient agreeable and responding well to care. Patient daughters at bedside and very involved in the patient care.   Emotional Assessment Appearance:  Appears stated age Attitude/Demeanor/Rapport:    Affect (typically observed):  Accepting, Calm Orientation:  Oriented to Self, Oriented to Place, Oriented to  Time, Oriented to Situation Alcohol / Substance use:  Not Applicable Psych involvement (Current and /or in the community):  No (Comment)  Discharge Needs  Concerns to be addressed:  Discharge Planning Concerns Readmission within the last 30 days:  No Current discharge risk:  Dependent with Mobility Barriers to Discharge:  Continued Medical Work up, [place], LCSW 02/15/2018, 10:45 AM

## 2018-02-15 NOTE — NC FL2 (Addendum)
[place] LEVEL OF CARE SCREENING TOOL     IDENTIFICATION  Patient Name: Tiffany Hartman Birthdate: 10/27/1927 Sex: female Admission Date (Current Location): 02/13/2018  [facility] and [place] Number:  [place] and Address:  [facility],  [place], [place]      Provider Number: [id number]  Attending Physician Name and Address:  Lavina Hamman, MD  Relative Name and Phone Number:       Current Level of Care: Hospital Recommended Level of Care: [place] Prior Approval Number:    Date Approved/Denied:   PASRR Number:   [id number]   Discharge Plan:      Current Diagnoses: Patient Active Problem List   Diagnosis Date Noted  . Femoral distal fracture ([place]) 02/13/2018  . Asthma exacerbation 01/31/2015  . Asthma with status asthmaticus 01/31/2015  . Asthma   . Chest pain 08/17/2014  . Dyspnea 08/17/2014  . Obesity 08/17/2014  . Diabetes ([place]) 08/17/2014  . Dizziness 12/27/2013  . Stroke risk 12/27/2013  . Stroke-like episode ([place]) 12/27/2013  . History of stroke 08/10/2012  . Parkinsons disease ([place]) 08/10/2012  . Left sided numbness 08/10/2012  . Left hemiparesis ([place]) 08/10/2012  . Dysarthria 08/10/2012  . Seizure disorder ([place]) 08/10/2012  . Other and unspecified hyperlipidemia 08/10/2012  . Sciatica neuralgia 06/02/2012  . DDD (degenerative disc disease), lumbar 06/02/2012  . Hip pain 06/02/2012  . Knee pain 06/02/2012  . Pes anserinus bursitis 06/02/2012  . Abdominal pain 09/01/2011  . SCIATICA 10/01/2009  . SHOULDER, ARTHRITIS, DEGEN./OSTEO 11/27/2008  . KNEE PAIN 07/26/2007  . SHOULDER PAIN 12/01/2006  . History of cardiovascular disorder 12/01/2006    Orientation RESPIRATION BLADDER Height & Weight     Self, Time, Situation, Place  O2 Incontinent Weight: 225 lb 5 oz (102.2 kg) Height:  5\' 4"  (162.6 cm)  BEHAVIORAL SYMPTOMS/MOOD NEUROLOGICAL BOWEL NUTRITION STATUS      Continent Diet   AMBULATORY STATUS COMMUNICATION OF NEEDS Skin   Extensive Assist Verbally Normal                       Personal Care Assistance Level of Assistance  Bathing, Feeding, Dressing Bathing Assistance: Limited assistance Feeding assistance: Independent Dressing Assistance: Maximum assistance     Functional Limitations Info  Sight, Hearing, Speech Sight Info: Adequate Hearing Info: Adequate Speech Info: Adequate    SPECIAL CARE FACTORS FREQUENCY  PT (By licensed PT), OT (By licensed OT)     PT Frequency: 5x/week OT Frequency: 5x/week            Contractures Contractures Info: Not present    Additional Factors Info  Psychotropic Code Status Info: Fullcode  Allergies Info: Allergies: No Known Allergies Psychotropic Info: Xanax, Prozac         Current Medications (02/15/2018):  This is the current hospital active medication list Current Facility-Administered Medications  Medication Dose Route Frequency Provider Last Rate Last Dose  . albuterol (PROVENTIL) (2.5 MG/3ML) 0.083% nebulizer solution 3 mL  3 mL Inhalation Q6H PRN Elwyn Reach, MD   3 mL at 02/14/18 2315  . ALPRAZolam Duanne Moron) tablet 0.25 mg  0.25 mg Oral TID Elwyn Reach, MD   0.25 mg at 02/15/18 0909  . amLODipine (NORVASC) tablet 5 mg  5 mg Oral Daily Elwyn Reach, MD   5 mg at 02/15/18 0913  . calcium-vitamin D (OSCAL WITH D) 500-200 MG-UNIT per tablet 1 tablet  1 tablet  Oral QPM Elwyn Reach, MD   1 tablet at 02/14/18 1654  . clopidogrel (PLAVIX) tablet 75 mg  75 mg Oral Q breakfast Elwyn Reach, MD   75 mg at 02/15/18 0913  . docusate sodium (COLACE) capsule 100 mg  100 mg Oral BID Lavina Hamman, MD   100 mg at 02/15/18 0913  . donepezil (ARICEPT) tablet 10 mg  10 mg Oral QHS Elwyn Reach, MD   10 mg at 02/14/18 2103  . enoxaparin (LOVENOX) injection 40 mg  40 mg Subcutaneous Q24H Gala Romney L, MD   40 mg at 02/14/18 2103  . erythromycin ophthalmic ointment 1 application   1 application Right Eye BID Elwyn Reach, MD   1 application at 29/52/84 [phone]  . ferrous gluconate (FERGON) tablet 325 mg  325 mg Oral Q breakfast Elwyn Reach, MD   325 mg at 02/15/18 0914  . FLUoxetine (PROZAC) capsule 20 mg  20 mg Oral Daily Elwyn Reach, MD   20 mg at 02/15/18 0909  . folic acid (FOLVITE) tablet 1 mg  1 mg Oral Daily Elwyn Reach, MD   1 mg at 02/15/18 0909  . gabapentin (NEURONTIN) capsule 300 mg  300 mg Oral TID Elwyn Reach, MD   300 mg at 02/15/18 0909  . ipratropium-albuterol (DUONEB) 0.5-2.5 (3) MG/3ML nebulizer solution 3 mL  3 mL Nebulization QID Elwyn Reach, MD   3 mL at 02/15/18 0931  . lactated ringers infusion   Intravenous Continuous Lavina Hamman, MD 100 mL/hr at 02/15/18 0920    . latanoprost (XALATAN) 0.005 % ophthalmic solution 1 drop  1 drop Both Eyes QHS Elwyn Reach, MD   1 drop at 02/14/18 2104  . levETIRAcetam (KEPPRA) tablet 250 mg  250 mg Oral BID Elwyn Reach, MD   250 mg at 02/15/18 0914  . ondansetron (ZOFRAN) tablet 4 mg  4 mg Oral Q6H PRN Elwyn Reach, MD       Or  . ondansetron (ZOFRAN) injection 4 mg  4 mg Intravenous Q6H PRN Elwyn Reach, MD      . oxyCODONE-acetaminophen (PERCOCET/ROXICET) 5-325 MG per tablet 1 tablet  1 tablet Oral Q4H PRN Elwyn Reach, MD   1 tablet at 02/15/18 0122  . pantoprazole (PROTONIX) EC tablet 40 mg  40 mg Oral Daily Elwyn Reach, MD   40 mg at 02/15/18 0913  . polyethylene glycol (MIRALAX / GLYCOLAX) packet 17 g  17 g Oral Daily Lavina Hamman, MD   17 g at 02/15/18 4010  . potassium chloride SA (K-DUR,KLOR-CON) CR tablet 20 mEq  20 mEq Oral BID Elwyn Reach, MD   20 mEq at 02/15/18 0909  . pravastatin (PRAVACHOL) tablet 10 mg  10 mg Oral q1800 Elwyn Reach, MD   10 mg at 02/14/18 1655  . prednisoLONE acetate (PRED FORTE) 1 % ophthalmic suspension 1 drop  1 drop Left Eye Daily Elwyn Reach, MD   1 drop at 02/13/18 2154     Discharge  Medications: Please see discharge summary for a list of discharge medications.  Relevant Imaging Results:  Relevant Lab Results:   Additional Information UVO:[id number]  Lia Hopping, LCSW

## 2018-02-15 NOTE — Progress Notes (Signed)
PROGRESS NOTE  Tiffany Hartman:[id number] DOB: 1927/01/19 DOA: 02/13/2018 PCP: Rosita Fire, MD  HPI/Recap of past 24 hours: Tiffany Hartman is an 85 year old female with medical history of dementia asthma, hypertension, osteoarthritis, status post bilateral total knee replacement, diabetes hypertension Parkinson's disease and previous CVA.  She sustained a fall with injury to her knee right she was brought to the ER for evaluation.  Subjective.  Continues to report pain.  No nausea no vomiting.  Still has cough and shortness of breath as well.  Assessment/Plan: Principal Problem:   Femoral distal fracture (HCC) Active Problems:   DDD (degenerative disc disease), lumbar   Parkinsons disease ([place])   Seizure disorder ([place])   Obesity   Diabetes ([place])   Asthma #1 fall at home 2.  Right knee pain continue anti-inflammatories 3.  Osteoarthritis status post knee replacement continue current pain regimen. Orthopedic was consulted. Outpatient follow-up.  4.  Right side supracondylar femoral fracture nondisplaced and protected by plates.  Dr. Wynelle Link orthopedic has evaluated patient and did not recommend any surgical intervention at this time he recommended PT recommends SNF.  5.  Acute asthma exacerbation. Acute hypoxic respiratory failure. Influenza PCR is negative. Continue nebulizer as well as cough suppressant.  Code Status: Full  Family Communication: None at bedside  Disposition Plan: Home when stable   Consultants: Orthopedic Gaynelle Arabian, MD      Procedures:  None  Antimicrobials:  None  DVT prophylaxis: Lovenox   Objective: Vitals:   02/15/18 0559 02/15/18 1221 02/15/18 1354 02/15/18 1620  BP: (!) 139/46  126/79   Pulse: 72  63   Resp: 16  16   Temp: 98 F (36.7 C)  98.3 F (36.8 C)   TempSrc:   Oral   SpO2: 100% 100% 98% 97%  Weight:      Height:        Intake/Output Summary (Last 24 hours) at 02/15/2018 1939 Last data filed at 02/15/2018  1747 Gross per 24 hour  Intake 1965.6 ml  Output 1000 ml  Net 965.6 ml   Filed Weights   02/13/18 2004  Weight: 102.2 kg   Body mass index is 38.67 kg/m.  Exam:  . General: 85 y.o. year-old female well developed well nourished in no acute distress.  Alert and oriented x3. . Cardiovascular: Regular rate and rhythm with no rubs or gallops.  No thyromegaly or JVD noted.   [place] Respiratory: Clear to auscultation with no wheezes or rales. Good inspiratory effort. . Abdomen: Soft nontender nondistended with normal bowel sounds x4 quadrants. . Musculoskeletal: Trace lower extremity edema. 2/4 pulses in all 4 extremities. . Skin: No ulcerative lesions noted or rashes healed knee scars bilaterally.  Swelling and tenderness of the right knee, . Psychiatry: Mood is appropriate for condition and setting    Data Reviewed: CBC: Recent Labs  Lab 02/13/18 1839 02/14/18 0435  WBC 9.0 7.3  NEUTROABS 6.0  --   HGB 9.9* 9.0*  HCT 33.8* 31.3*  MCV 96.8 98.4  PLT 332 811   Basic Metabolic Panel: Recent Labs  Lab 02/13/18 1839 02/14/18 0435  NA 141 141  K 4.7 4.6  CL 103 105  CO2 30 30  GLUCOSE 103* 130*  BUN 17 22  CREATININE 1.40* 1.48*  CALCIUM 8.7* 8.5*   GFR: Estimated Creatinine Clearance: 30.6 mL/min (A) (by C-G formula based on SCr of 1.48 mg/dL (H)). Liver Function Tests: Recent Labs  Lab 02/14/18 0435  AST 10*  ALT 10  ALKPHOS 55  BILITOT 0.5  PROT 5.5*  ALBUMIN 2.9*   No results for input(s): LIPASE, AMYLASE in the last 168 hours. No results for input(s): AMMONIA in the last 168 hours. Coagulation Profile: No results for input(s): INR, PROTIME in the last 168 hours. Cardiac Enzymes: No results for input(s): CKTOTAL, CKMB, CKMBINDEX, TROPONINI in the last 168 hours. BNP (last 3 results) No results for input(s): PROBNP in the last 8760 hours. HbA1C: No results for input(s): HGBA1C in the last 72 hours. CBG: Recent Labs  Lab 02/14/18 1156 02/14/18 1653  02/14/18 2223 02/15/18 0706 02/15/18 1147  GLUCAP 156* 111* 108* 102* 107*   Lipid Profile: No results for input(s): CHOL, HDL, LDLCALC, TRIG, CHOLHDL, LDLDIRECT in the last 72 hours. Thyroid Function Tests: No results for input(s): TSH, T4TOTAL, FREET4, T3FREE, THYROIDAB in the last 72 hours. Anemia Panel: No results for input(s): VITAMINB12, FOLATE, FERRITIN, TIBC, IRON, RETICCTPCT in the last 72 hours. Urine analysis:    Component Value Date/Time   COLORURINE YELLOW 08/22/2014 1300   APPEARANCEUR CLEAR 08/22/2014 1300   LABSPEC 1.010 08/22/2014 1300   PHURINE 6.0 08/22/2014 1300   GLUCOSEU NEGATIVE 08/22/2014 1300   HGBUR NEGATIVE 08/22/2014 1300   BILIRUBINUR NEGATIVE 08/22/2014 1300   KETONESUR NEGATIVE 08/22/2014 1300   PROTEINUR NEGATIVE 08/22/2014 1300   UROBILINOGEN 0.2 08/22/2014 1300   NITRITE NEGATIVE 08/22/2014 1300   LEUKOCYTESUR NEGATIVE 08/22/2014 1300   Sepsis Labs: @LABRCNTIP (procalcitonin:4,lacticidven:4)  )No results found for this or any previous visit (from the past 240 hour(s)).    Studies: No results found.  Scheduled Meds: . ALPRAZolam  0.25 mg Oral TID  . amLODipine  5 mg Oral Daily  . benzonatate  100 mg Oral TID  . calcium-vitamin D  1 tablet Oral QPM  . clopidogrel  75 mg Oral Q breakfast  . docusate sodium  100 mg Oral BID  . donepezil  10 mg Oral QHS  . enoxaparin (LOVENOX) injection  40 mg Subcutaneous Q24H  . erythromycin  1 application Right Eye BID  . ferrous gluconate  325 mg Oral Q breakfast  . FLUoxetine  20 mg Oral Daily  . folic acid  1 mg Oral Daily  . gabapentin  300 mg Oral TID  . ipratropium-albuterol  3 mL Nebulization QID  . latanoprost  1 drop Both Eyes QHS  . levETIRAcetam  250 mg Oral BID  . pantoprazole  40 mg Oral Daily  . polyethylene glycol  17 g Oral Daily  . potassium chloride SA  20 mEq Oral BID  . pravastatin  10 mg Oral q1800  . prednisoLONE acetate  1 drop Left Eye Daily    Continuous Infusions: .  lactated ringers 100 mL/hr at 02/15/18 1815     LOS: 2 days     Berle Mull, MD [facility]  To reach me or the doctor on call, go to: [email or web address] Password [email or web address]  02/15/2018, 7:39 PM

## 2018-02-15 NOTE — Progress Notes (Signed)
Subjective: Patient states her leg does not hurt as much today   Objective: Vital signs in last 24 hours: Temp:  [97.7 F (36.5 C)-98.4 F (36.9 C)] 98 F (36.7 C) (02/10 0559) Pulse Rate:  [62-72] 72 (02/10 0559) Resp:  [16-17] 16 (02/10 0559) BP: (115-139)/(46-55) 139/46 (02/10 0559) SpO2:  [93 %-100 %] 100 % (02/10 0559)  Intake/Output from previous day: 02/09 0701 - 02/10 0700 In: 3191.1 [P.O.:1200; I.V.:1991.1] Out: 1050 [Urine:1050] Intake/Output this shift: Total I/O In: 1389.4 [P.O.:480; I.V.:909.4] Out: 750 [Urine:750]  Recent Labs    02/13/18 1839 02/14/18 0435  HGB 9.9* 9.0*   Recent Labs    02/13/18 1839 02/14/18 0435  WBC 9.0 7.3  RBC 3.49* 3.18*  HCT 33.8* 31.3*  PLT 332 300   Recent Labs    02/13/18 1839 02/14/18 0435  NA 141 141  K 4.7 4.6  CL 103 105  CO2 30 30  BUN 17 22  CREATININE 1.40* 1.48*  GLUCOSE 103* 130*  CALCIUM 8.7* 8.5*   No results for input(s): LABPT, INR in the last 72 hours.  Distal thigh minimal swelling-unchanged Decreased tenderness Moving right leg more in bed reporting less discomfort   Assessment/Plan: Right distal femur fracture- Continue PT. May need SNF if not progressing well     Tiffany Hartman 02/15/2018, 7:00 AM

## 2018-02-16 ENCOUNTER — Encounter: Payer: Self-pay | Admitting: Internal Medicine

## 2018-02-16 ENCOUNTER — Inpatient Hospital Stay
Admission: RE | Admit: 2018-02-16 | Discharge: 2018-03-02 | Disposition: A | Discharge status: Home / Self Care | Payer: Medicare Other | Source: Ambulatory Visit | Attending: Internal Medicine | Admitting: Internal Medicine

## 2018-02-16 ENCOUNTER — Non-Acute Institutional Stay (SKILLED_NURSING_FACILITY): Payer: Medicare Other | Admitting: Internal Medicine

## 2018-02-16 DIAGNOSIS — R279 Unspecified lack of coordination: Secondary | ICD-10-CM | POA: Diagnosis not present

## 2018-02-16 DIAGNOSIS — Z7401 Bed confinement status: Secondary | ICD-10-CM | POA: Diagnosis not present

## 2018-02-16 DIAGNOSIS — K219 Gastro-esophageal reflux disease without esophagitis: Secondary | ICD-10-CM | POA: Diagnosis not present

## 2018-02-16 DIAGNOSIS — M1990 Unspecified osteoarthritis, unspecified site: Secondary | ICD-10-CM | POA: Diagnosis not present

## 2018-02-16 DIAGNOSIS — M5432 Sciatica, left side: Secondary | ICD-10-CM | POA: Diagnosis not present

## 2018-02-16 DIAGNOSIS — E669 Obesity, unspecified: Secondary | ICD-10-CM | POA: Diagnosis not present

## 2018-02-16 DIAGNOSIS — J9611 Chronic respiratory failure with hypoxia: Secondary | ICD-10-CM | POA: Diagnosis not present

## 2018-02-16 DIAGNOSIS — I10 Essential (primary) hypertension: Secondary | ICD-10-CM | POA: Diagnosis not present

## 2018-02-16 DIAGNOSIS — Z96653 Presence of artificial knee joint, bilateral: Secondary | ICD-10-CM | POA: Diagnosis not present

## 2018-02-16 DIAGNOSIS — M2550 Pain in unspecified joint: Secondary | ICD-10-CM | POA: Diagnosis not present

## 2018-02-16 DIAGNOSIS — J42 Unspecified chronic bronchitis: Secondary | ICD-10-CM | POA: Diagnosis not present

## 2018-02-16 DIAGNOSIS — I5032 Chronic diastolic (congestive) heart failure: Secondary | ICD-10-CM | POA: Diagnosis not present

## 2018-02-16 DIAGNOSIS — J4520 Mild intermittent asthma, uncomplicated: Secondary | ICD-10-CM | POA: Diagnosis not present

## 2018-02-16 DIAGNOSIS — M6281 Muscle weakness (generalized): Secondary | ICD-10-CM | POA: Diagnosis not present

## 2018-02-16 DIAGNOSIS — R457 State of emotional shock and stress, unspecified: Secondary | ICD-10-CM | POA: Diagnosis not present

## 2018-02-16 DIAGNOSIS — G20 Parkinson's disease: Secondary | ICD-10-CM | POA: Diagnosis not present

## 2018-02-16 DIAGNOSIS — F0390 Unspecified dementia without behavioral disturbance: Secondary | ICD-10-CM | POA: Diagnosis not present

## 2018-02-16 DIAGNOSIS — M79604 Pain in right leg: Secondary | ICD-10-CM | POA: Diagnosis not present

## 2018-02-16 DIAGNOSIS — R600 Localized edema: Secondary | ICD-10-CM | POA: Diagnosis not present

## 2018-02-16 DIAGNOSIS — Z9181 History of falling: Secondary | ICD-10-CM | POA: Diagnosis not present

## 2018-02-16 DIAGNOSIS — M5136 Other intervertebral disc degeneration, lumbar region: Secondary | ICD-10-CM | POA: Diagnosis not present

## 2018-02-16 DIAGNOSIS — H409 Unspecified glaucoma: Secondary | ICD-10-CM | POA: Diagnosis not present

## 2018-02-16 DIAGNOSIS — I1310 Hypertensive heart and chronic kidney disease without heart failure, with stage 1 through stage 4 chronic kidney disease, or unspecified chronic kidney disease: Secondary | ICD-10-CM | POA: Diagnosis not present

## 2018-02-16 DIAGNOSIS — S72401D Unspecified fracture of lower end of right femur, subsequent encounter for closed fracture with routine healing: Secondary | ICD-10-CM

## 2018-02-16 DIAGNOSIS — M9711XA Periprosthetic fracture around internal prosthetic right knee joint, initial encounter: Secondary | ICD-10-CM | POA: Diagnosis not present

## 2018-02-16 DIAGNOSIS — J45909 Unspecified asthma, uncomplicated: Secondary | ICD-10-CM | POA: Diagnosis not present

## 2018-02-16 DIAGNOSIS — G40909 Epilepsy, unspecified, not intractable, without status epilepticus: Secondary | ICD-10-CM

## 2018-02-16 DIAGNOSIS — E785 Hyperlipidemia, unspecified: Secondary | ICD-10-CM | POA: Diagnosis not present

## 2018-02-16 DIAGNOSIS — Z741 Need for assistance with personal care: Secondary | ICD-10-CM | POA: Diagnosis not present

## 2018-02-16 DIAGNOSIS — R0602 Shortness of breath: Secondary | ICD-10-CM | POA: Diagnosis not present

## 2018-02-16 DIAGNOSIS — D649 Anemia, unspecified: Secondary | ICD-10-CM | POA: Diagnosis not present

## 2018-02-16 DIAGNOSIS — I639 Cerebral infarction, unspecified: Secondary | ICD-10-CM | POA: Diagnosis not present

## 2018-02-16 DIAGNOSIS — E1122 Type 2 diabetes mellitus with diabetic chronic kidney disease: Secondary | ICD-10-CM | POA: Diagnosis not present

## 2018-02-16 DIAGNOSIS — M5431 Sciatica, right side: Secondary | ICD-10-CM | POA: Diagnosis not present

## 2018-02-16 DIAGNOSIS — F418 Other specified anxiety disorders: Secondary | ICD-10-CM | POA: Diagnosis not present

## 2018-02-16 LAB — COMPREHENSIVE METABOLIC PANEL
ALT: 9 U/L (ref 0–44)
AST: 10 U/L — ABNORMAL LOW (ref 15–41)
Albumin: 2.8 g/dL — ABNORMAL LOW (ref 3.5–5.0)
Alkaline Phosphatase: 51 U/L (ref 38–126)
Anion gap: 7 (ref 5–15)
BUN: 19 mg/dL (ref 8–23)
CO2: 25 mmol/L (ref 22–32)
Calcium: 8.4 mg/dL — ABNORMAL LOW (ref 8.9–10.3)
Chloride: 106 mmol/L (ref 98–111)
Creatinine, Ser: 1.17 mg/dL — ABNORMAL HIGH (ref 0.44–1.00)
GFR calc Af Amer: 48 mL/min — ABNORMAL LOW (ref >60)
GFR calc non Af Amer: 42 mL/min — ABNORMAL LOW (ref >60)
Glucose, Bld: 115 mg/dL — ABNORMAL HIGH (ref 70–99)
Potassium: 5 mmol/L (ref 3.5–5.1)
Sodium: 138 mmol/L (ref 135–145)
Total Bilirubin: 0.3 mg/dL (ref 0.3–1.2)
Total Protein: 5.4 g/dL — ABNORMAL LOW (ref 6.5–8.1)

## 2018-02-16 LAB — CBC WITH DIFFERENTIAL/PLATELET
ABS IMMATURE GRANULOCYTES: 0.02 10*3/uL (ref 0.00–0.07)
BASOS PCT: 1 %
Basophils Absolute: 0.1 10*3/uL (ref 0.0–0.1)
Eosinophils Absolute: 0.7 10*3/uL — ABNORMAL HIGH (ref 0.0–0.5)
Eosinophils Relative: 10 %
HCT: 30.5 % — ABNORMAL LOW (ref 36.0–46.0)
Hemoglobin: 8.7 g/dL — ABNORMAL LOW (ref 12.0–15.0)
Immature Granulocytes: 0 %
Lymphocytes Relative: 26 %
Lymphs Abs: 1.9 10*3/uL (ref 0.7–4.0)
MCH: 28.2 pg (ref 26.0–34.0)
MCHC: 28.5 g/dL — ABNORMAL LOW (ref 30.0–36.0)
MCV: 99 fL (ref 80.0–100.0)
Monocytes Absolute: 0.5 10*3/uL (ref 0.1–1.0)
Monocytes Relative: 7 %
NEUTROS ABS: 4.1 10*3/uL (ref 1.7–7.7)
Neutrophils Relative %: 56 %
Platelets: 279 10*3/uL (ref 150–400)
RBC: 3.08 MIL/uL — ABNORMAL LOW (ref 3.87–5.11)
RDW: 14.1 % (ref 11.5–15.5)
WBC: 7.3 10*3/uL (ref 4.0–10.5)
nRBC: 0 % (ref 0.0–0.2)

## 2018-02-16 LAB — MAGNESIUM: Magnesium: 1.8 mg/dL (ref 1.7–2.4)

## 2018-02-16 MED ORDER — DOCUSATE SODIUM 100 MG PO CAPS: 100.0000 mg | ORAL_CAPSULE | Freq: Two times a day (BID) | ORAL | 0 refills | Status: DC

## 2018-02-16 MED ORDER — POLYETHYLENE GLYCOL 3350 17 G PO PACK: 17.0000 g | PACK | Freq: Every day | ORAL | 0 refills | Status: DC

## 2018-02-16 MED ORDER — METHOCARBAMOL 500 MG PO TABS
500.0000 mg | ORAL_TABLET | Freq: Two times a day (BID) | ORAL | Status: DC
  Administered 2018-02-16: 500 mg via ORAL
  Filled 2018-02-16: qty 1

## 2018-02-16 MED ORDER — IPRATROPIUM-ALBUTEROL 0.5-2.5 (3) MG/3ML IN SOLN: 3.0000 mL | Freq: Four times a day (QID) | RESPIRATORY_TRACT | 0 refills | Status: DC

## 2018-02-16 MED ORDER — BENZONATATE 100 MG PO CAPS: 100.0000 mg | ORAL_CAPSULE | Freq: Three times a day (TID) | ORAL | 0 refills | Status: DC

## 2018-02-16 MED ORDER — GUAIFENESIN 100 MG/5ML PO SOLN: 5.0000 mL | ORAL | 0 refills | Status: DC | PRN

## 2018-02-16 MED ORDER — METHOCARBAMOL 500 MG PO TABS: 500.0000 mg | ORAL_TABLET | Freq: Three times a day (TID) | ORAL | 0 refills | Status: DC | PRN

## 2018-02-16 MED ORDER — FUROSEMIDE 40 MG PO TABS: 40.0000 mg | ORAL_TABLET | ORAL | 0 refills | Status: DC

## 2018-02-16 MED ORDER — ALPRAZOLAM 0.25 MG PO TABS: 0.2500 mg | ORAL_TABLET | Freq: Three times a day (TID) | ORAL | 0 refills | Status: DC | PRN

## 2018-02-16 MED ORDER — OXYCODONE-ACETAMINOPHEN 5-325 MG PO TABS: 1.0000 | ORAL_TABLET | Freq: Four times a day (QID) | ORAL | 0 refills | Status: DC | PRN

## 2018-02-16 NOTE — Clinical Social Work Placement (Signed)
   CLINICAL SOCIAL WORK PLACEMENT  NOTE  Date:  02/16/2018  Patient Details  Name: SWAY GUTTIERREZ MRN: [id number] Date of Birth: 18-Dec-1927  Clinical Social Work is seeking post-discharge placement for this patient at the [place] level of care (*CSW will initial, date and re-position this form in  chart as items are completed):  Yes   Patient/family provided with [place] Work Department's list of facilities offering this level of care within the geographic area requested by the patient (or if unable, by the patient's family).  Yes   Patient/family informed of their freedom to choose among providers that offer the needed level of care, that participate in Medicare, Medicaid or managed care program needed by the patient, have an available bed and are willing to accept the patient.  Yes   Patient/family informed of [place]'s ownership interest in [facility] and [facility], as well as of the fact that they are under no obligation to receive care at these facilities.  PASRR submitted to EDS on       PASRR number received on       Existing PASRR number confirmed on 02/15/18     FL2 transmitted to all facilities in geographic area requested by pt/family on       FL2 transmitted to all facilities within larger geographic area on 02/15/18     Patient informed that his/her managed care company has contracts with or will negotiate with certain facilities, including the following:  [facility]     No   Patient/family informed of bed offers received.  Patient chooses bed at [facility]     Physician recommends and patient chooses bed at      Patient to be transferred to [facility] on 02/16/18.  Patient to be transferred to facility by [facility]     Patient family notified on 02/16/18 of transfer.  Name of family member notified:  Daughters Deneise Lever Lee/ Linda    PHYSICIAN Please prepare priority discharge summary,  including medications     Additional Comment:    _______________________________________________ Lia Hopping, LCSW 02/16/2018, 9:45 AM

## 2018-02-16 NOTE — Care Management Important Message (Signed)
Important Message  Patient Details  Name: Tiffany Hartman MRN: [id number] Date of Birth: Aug 18, 1927   Medicare Important Message Given:  Yes    Kerin Salen 02/16/2018, 9:58 AMImportant Message  Patient Details  Name: Tiffany Hartman MRN: [id number] Date of Birth: June 09, 1927   Medicare Important Message Given:  Yes    Kerin Salen 02/16/2018, 9:58 AM

## 2018-02-16 NOTE — Progress Notes (Signed)
Physical Therapy Treatment Patient Details Name: Tiffany Hartman MRN: [id number] DOB: [birth year] Today's Date: 02/16/2018    History of Present Illness Tiffany Hartman is a 85 y.o. female with medical history significant of dementia, asthma, hypertension, osteoarthritis status post bilateral total knee replacement, diabetes, Parkinson's disease, previous CVA and glaucoma who apparently  sustained a fall.  resulting in right supracondylar fx, see by ortho (Dr. Wynelle Link) and recommended no surgical interverntion at this time as fx is stable      PT Comments    Pt progressing slowly. Will benefit from SNF, likely d/c today. Pt is very pleasant and cooperative with PT.  Follow Up Recommendations  SNF     Equipment Recommendations  None recommended by PT    Recommendations for Other Services       Precautions / Restrictions Precautions Precautions: Fall Restrictions Weight Bearing Restrictions: Yes RLE Weight Bearing: Partial weight bearing RLE Partial Weight Bearing Percentage or Pounds: 50%    Mobility  Bed Mobility                  Transfers                    Ambulation/Gait                 Stairs             Wheelchair Mobility    Modified Rankin (Stroke Patients Only)       Balance                                            Cognition Arousal/Alertness: Awake/alert Behavior During Therapy: WFL for tasks assessed/performed Overall Cognitive Status: History of cognitive impairments - at baseline                                 General Comments: hx of dementia,appropriate for purposes of PT eval      Exercises General Exercises - Upper Extremity Shoulder Flexion: AAROM;Both;10 reps;Limitations Shoulder Flexion Limitations: pain with flexion greater than 95* Elbow Extension: AROM;Strengthening;Both;5 reps(manual resistance ) General Exercises - Lower Extremity Ankle Circles/Pumps: AROM;Both;10  reps Quad Sets: AROM;Both;10 reps Gluteal Sets: AROM;Both;10 reps Heel Slides: AAROM;Both;10 reps;Limitations Heel Slides Limitations: flexion limited bil, R side d/t pain Hip ABduction/ADduction: Both;10 reps;AAROM;AROM Straight Leg Raises: AROM;Left;10 reps    General Comments        Pertinent Vitals/Pain Pain Assessment: 0-10 Pain Score: 4  Pain Location: right LE (hip >knee) Pain Descriptors / Indicators: Aching;Discomfort;Grimacing Pain Intervention(s): Limited activity within patient's tolerance;Monitored during session;Repositioned;Ice applied    Home Living                      Prior Function            PT Goals (current goals can now be found in the care plan section) Acute Rehab PT Goals Patient Stated Goal: home  PT Goal Formulation: With patient Time For Goal Achievement: 02/28/18 Potential to Achieve Goals: Fair Progress towards PT goals: Progressing toward goals    Frequency    Min 3X/week      PT Plan Current plan remains appropriate    Co-evaluation              AM-PAC PT "6 Clicks" Mobility  Outcome Measure  Help needed turning from your back to your side while in a flat bed without using bedrails?: A Lot Help needed moving from lying on your back to sitting on the side of a flat bed without using bedrails?: A Lot Help needed moving to and from a bed to a chair (including a wheelchair)?: A Lot Help needed standing up from a chair using your arms (e.g., wheelchair or bedside chair)?: Total Help needed to walk in hospital room?: Total Help needed climbing 3-5 steps with a railing? : Total 6 Click Score: 9    End of Session   Activity Tolerance: Patient limited by fatigue;Patient tolerated treatment well(pt feel she needs breathing tx) Patient left: in bed;with call bell/phone within reach;with family/visitor present;with bed alarm set   PT Visit Diagnosis: Difficulty in walking, not elsewhere classified (R26.2);History of  falling (Z91.81)     Time: 9379-0240 PT Time Calculation (min) (ACUTE ONLY): 29 min  Charges:  $Therapeutic Exercise: 23-37 mins                     Kenyon Ana, PT  Pager: [phone] Acute Rehab Dept [facility]): [phone]   02/16/2018    [facility] 02/16/2018, 11:02 AM

## 2018-02-16 NOTE — Progress Notes (Signed)
At 1223, I attempted to provide report to [facility], but they reported that they have not received any paperwork regarding an admission. I told them I would call back shortly and attempt to provide report again.

## 2018-02-16 NOTE — Plan of Care (Signed)
  Problem: Education: Goal: Knowledge of General Education information will improve Description Including pain rating scale, medication(s)/side effects and non-pharmacologic comfort measures Outcome: Progressing   Problem: Health Behavior/Discharge Planning: Goal: Ability to manage health-related needs will improve Outcome: Progressing   Problem: Clinical Measurements: Goal: Respiratory complications will improve Outcome: Progressing   Problem: Clinical Measurements: Goal: Cardiovascular complication will be avoided Outcome: Progressing   Problem: Pain Managment: Goal: General experience of comfort will improve Outcome: Progressing

## 2018-02-16 NOTE — Progress Notes (Signed)
Location:    [facility] Room Number: 102/P Place of Service:  SNF (31) Provider:  Granville Lewis PA-C  Rosita Fire, MD  Patient Care Team: Rosita Fire, MD as PCP - General (Internal Medicine) Danie Binder, MD (Gastroenterology)  Extended Emergency Contact Information Primary Emergency Contact: Alvira Monday Address: [address]          [place], [place] [address] [place] Phone: [phone] Work Phone: [phone] Mobile Phone: [phone] Relation: Daughter Secondary Emergency Contact: [place] Phone: [phone] Relation: Daughter  Code Status:  Full Code Goals of care: Advanced Directive information Advanced Directives 02/16/2018  Does Patient Have a Medical Advance Directive? Yes  Type of Advance Directive (No Data)  Does patient want to make changes to medical advance directive? No - Patient declined  Would patient like information on creating a medical advance directive? No - Patient declined  Pre-existing out of facility DNR order (yellow form or pink MOST form) -     Chief Complaint  Patient presents with  . Hospitalization Follow-up    Hospitalization F/U Visit  Status post hospitalization for right supracondylar femur fracture-that did not require surgery  HPI:  Pt is a 85 y.o. female seen today for a hospital f/u s/p admission for a right supracondylar femur fracture.  Patient has a history of dementia asthma hypertension osteoarthritis with a history of bilateral total knee replacements in the past as well as type 2 diabetes Parkinson's disease and history of glaucoma.  Apparently she fell at home and sustained a fall she came to the ER where she was evaluated.  She was found to have the right supracondylar femur fracture-orthopedics was consulted and the fracture was deemed to be nonsurgical.  She was admitted for pain control as well as supportive care. The  fracture was nondisplaced and per Ortho was protected by a plate with a stable injury so did not require surgery.  Emphasis was on physical therapy and pain control she is 50% partial weightbearing on the right lower extremity.  She does have Percocet for pain.  Currently the pain appears to be controlled.  She also has a history of seizure disorder and continues on Keppra apparently this is been stable for some time.  Regards to asthma she is on routine nebulizers and continuous oxygen.  She does have a diagnosis of Parkinson's disease but currently is not on any medication.  Regards to hypertension she is on Norvasc blood pressure today is 141/68 we will continue to monitor.  She also has a history of chronic kidney disease stage III renal function was stable on lab today creatinine was 1.17.  She also has a history of grade 1 diastolic CHF per echo done in 2018- she is on Lasix which was changed from daily to 3 times a week.  She also has a history of chronic anemia with unclear etiology hemoglobin was 8.7 on lab today.  Currently she is lying in bed comfortably she does not really have any complaints   Past Medical History:  Diagnosis Date  . Asthma   . Breast cancer ([facility]) 02/20/83   LT mastectomy  . Bronchitis   . Dementia ([facility])   . Diabetes mellitus ([facility])   . Diverticulitis 2009  . Glaucoma   . HTN (hypertension)   . OA (osteoarthritis)   . Obesity 08/17/2014  . Parkinson disease ([facility])   . Stroke ([facility])   . Thyroid nodule  Dr Legrand Rams   Past Surgical History:  Procedure Laterality Date  .  bilateral catracts    . ABDOMINAL HYSTERECTOMY     complete  . APPENDECTOMY    . EUS  09/10/2011   Procedure: UPPER ENDOSCOPIC ULTRASOUND (EUS) RADIAL;  Surgeon: Arta Silence, MD;  Location: [facility];  Service: Endoscopy;  Laterality: N/A;  christina/ebp  . FINE NEEDLE ASPIRATION  09/10/2011   Procedure: FINE NEEDLE ASPIRATION (FNA) LINEAR;  Surgeon: Arta Silence, MD;   Location: [facility];  Service: Endoscopy;  Laterality: N/A;  . FRACTURE SURGERY  2008    right and left femurs  . JOINT REPLACEMENT  1998/1999   knee boths  . KNEE SURGERY Right    TKA  . KNEE SURGERY Left    TKA  . KNEE SURGERY Right    Periprosthetic fracture /otif  . MASTECTOMY  02/20/1983   left   . PARS PLANA VITRECTOMY Right 06/06/2016   Procedure: VITRECTOMY WITH CULTURES AND INJECTION OF ANTIBIOTICS; ANTERIOR CHAMBER WASHOUT;  Surgeon: Jalene Mullet, MD;  Location: [facility];  Service: Ophthalmology;  Laterality: Right;    No Known Allergies  Allergies as of 02/16/2018   No Known Allergies     Medication List       Accurate as of February 16, 2018  4:48 PM. Always use your most recent med list.        albuterol 108 (90 Base) MCG/ACT inhaler Commonly known as:  PROVENTIL HFA;VENTOLIN HFA Inhale 2 puffs into the lungs every 6 (six) hours as needed for wheezing or shortness of breath.   ALPRAZolam 0.25 MG tablet Commonly known as:  XANAX Take 1 tablet (0.25 mg total) by mouth 3 (three) times daily as needed for anxiety or sleep.   amLODipine 5 MG tablet Commonly known as:  NORVASC Take 5 mg by mouth daily.   benzonatate 100 MG capsule Commonly known as:  TESSALON Take 1 capsule (100 mg total) by mouth 3 (three) times daily.   Calcium Carbonate-Vitamin D 600-200 MG-UNIT Tabs Take 1 tablet by mouth every evening.   clopidogrel 75 MG tablet Commonly known as:  PLAVIX Take 1 tablet (75 mg total) by mouth daily with breakfast.   docusate sodium 100 MG capsule Commonly known as:  COLACE Take 1 capsule (100 mg total) by mouth 2 (two) times daily.   donepezil 10 MG tablet Commonly known as:  ARICEPT Take 10 mg by mouth at bedtime.   erythromycin ophthalmic ointment Place 1 application into the right eye 2 (two) times daily.   ferrous gluconate 324 MG tablet Commonly known as:  FERGON Take 324 mg by mouth daily with breakfast.   FLUoxetine 20 MG  capsule Commonly known as:  PROZAC Take 20 mg by mouth daily.   folic acid 1 MG tablet Commonly known as:  FOLVITE Take 1 mg by mouth daily.   furosemide 40 MG tablet Commonly known as:  LASIX Take 1 tablet (40 mg total) by mouth 3 (three) times a week. Start taking on:  February 17, 2018   gabapentin 300 MG capsule Commonly known as:  NEURONTIN Take 1 capsule (300 mg total) by mouth 3 (three) times daily.   guaiFENesin 100 MG/5ML Soln Commonly known as:  ROBITUSSIN Take 5 mLs (100 mg total) by mouth every 4 (four) hours as needed for cough or to loosen phlegm.   ipratropium-albuterol 0.5-2.5 (3) MG/3ML Soln Commonly known as:  DUONEB Take 3 mLs by nebulization 4 (four) times daily.   latanoprost  0.005 % ophthalmic solution Commonly known as:  XALATAN Place 1 drop into both eyes at bedtime.   levETIRAcetam 250 MG tablet Commonly known as:  KEPPRA Take 250 mg by mouth 2 (two) times daily.   lovastatin 10 MG tablet Commonly known as:  MEVACOR Take 10 mg by mouth at bedtime.   methocarbamol 500 MG tablet Commonly known as:  ROBAXIN Take 1 tablet (500 mg total) by mouth every 8 (eight) hours as needed for muscle spasms.   omeprazole 20 MG capsule Commonly known as:  PRILOSEC Take 20 mg by mouth 2 (two) times daily.   oxyCODONE-acetaminophen 5-325 MG tablet Commonly known as:  PERCOCET/ROXICET Take 1 tablet by mouth every 6 (six) hours as needed for up to 5 days for moderate pain.   polyethylene glycol packet Commonly known as:  MIRALAX / GLYCOLAX Take 17 g by mouth daily. Start taking on:  February 17, 2018   prednisoLONE acetate 1 % ophthalmic suspension Commonly known as:  PRED FORTE Place 1 drop into the left eye daily.       Review of Systems General she not complain of any fever or chills.  Skin does not complain of rashes or itching.  Head ears eyes nose mouth and throat is not complaining of any sore throat or visual changes from baseline she is  blind in her right eye.  Respiratory denies increased shortness of breath or cough increased from baseline she is on chronic oxygen.  Cardiac does not complain of chest pain appears to have some quite mild edema.  GI does not complain of abdominal discomfort nausea vomiting diarrhea constipation.  GU does not complain of dysuria.  Musculoskeletal at this point says her pain is relatively well controlled in her right hip does not complain of pain otherwise she does have a history of degenerative joint disease at multiple sites.  Neurologic does not complain of dizziness headache or numbness.  And psych does have a history of anxiety she is on Xanax 3 times a day does not appear to be overtly anxious currently  There is no immunization history on file for this patient. Pertinent  Health Maintenance Due  Topic Date Due  . INFLUENZA VACCINE  03/17/2018 (Originally 08/06/2017)  . FOOT EXAM  03/17/2018 (Originally 05/09/1937)  . HEMOGLOBIN A1C  03/17/2018 (Originally 02/11/2013)  . OPHTHALMOLOGY EXAM  03/17/2018 (Originally 05/09/1937)  . URINE MICROALBUMIN  03/17/2018 (Originally 05/09/1937)  . DEXA SCAN  03/17/2018 (Originally 05/09/1994)  . PNA vac Low Risk Adult (1 of 2 - PCV13) 03/17/2018 (Originally 05/09/1994)   Fall Risk  11/24/2017 07/29/2016 09/19/2014  Falls in the past year? 1 Yes No  Comment Emmi Telephone Survey: data to providers prior to load Emmi Telephone Survey: data to providers prior to load -  Number falls in past yr: 1 1 -  Comment Emmi Telephone Survey Actual Response = 1 Emmi Telephone Survey Actual Response = 1 -  Injury with Fall? 1 Yes -  Risk for fall due to : - - Impaired mobility;Impaired balance/gait;Impaired vision   Functional Status Survey:    Vitals:   02/16/18 1647  BP: (!) 141/68  Pulse: 75  Resp: 19  Temp: 98.3 F (36.8 C)  TempSrc: Oral  Weight: 235 lb 12.8 oz (107 kg)  Height: 5\' 4"  (1.626 m)   Body mass index is 40.47 kg/m. Physical Exam In  general this is a pleasant elderly female in no distress lying comfortably in bed-she does appear little anxious.  Her  skin is warm and dry.  She does have well-healed knee scars.  Eyes visual acuity appears to be at baseline intact the left eye she says she can only see light in her right eye.  Oropharynx is clear mucous membranes moist.  Chest she has a small amount of expiratory wheezing diffuse-per patient this is not new and baseline there is no labored breathing.  Heart is regular rate and rhythm without murmur gallop or rub she has quite mild lower extremity edema bilaterally.  Abdomen is obese soft nontender with positive bowel sounds.  Musculoskeletal Limited exam since she is in bed but does move her upper extremities at baseline as well as left lower extremity- limited mobility of right lower extremity secondary to recent fracture.  Neurologic is grossly intact her speech is clear no lateralizing findings.  Psych she does have some cognitive deficits although this appears to be fairly mild she is pleasant and appropriate.   Recent Labs    02/13/18 1839 02/14/18 0435 02/16/18 0417  NA 141 141 138  K 4.7 4.6 5.0  CL 103 105 106  CO2 30 30 25   GLUCOSE 103* 130* 115*  BUN 17 22 19   CREATININE 1.40* 1.48* 1.17*  CALCIUM 8.7* 8.5* 8.4*  MG  --   --  1.8   Recent Labs    02/14/18 0435 02/16/18 0417  AST 10* 10*  ALT 10 9  ALKPHOS 55 51  BILITOT 0.5 0.3  PROT 5.5* 5.4*  ALBUMIN 2.9* 2.8*   Recent Labs    02/13/18 1839 02/14/18 0435 02/16/18 0417  WBC 9.0 7.3 7.3  NEUTROABS 6.0  --  4.1  HGB 9.9* 9.0* 8.7*  HCT 33.8* 31.3* 30.5*  MCV 96.8 98.4 99.0  PLT 332 300 279    Lab Results  Component Value Date   HGBA1C 6.5 (H) 08/11/2012   Lab Results  Component Value Date   CHOL 163 08/11/2012   HDL 45 08/11/2012   LDLCALC 91 08/11/2012   TRIG 135 08/11/2012   CHOLHDL 3.6 08/11/2012    Significant Diagnostic Results in last 30 days:  Dg Knee  Complete 4 Views Right  Addendum Date: 02/13/2018   ADDENDUM REPORT: 02/13/2018 16:46 ADDENDUM: Upon further review, there does appear to be an acute minimally displaced fracture involving the distal right femoral shaft in the area that that has previously undergone internal fixation. I discussed this finding with Dr. Owens Shark. This finding is much more easily apparent on the femur series than the knee series. Electronically Signed   By: Marijo Conception, M.D.   On: 02/13/2018 16:46   Result Date: 02/13/2018 CLINICAL DATA:  Per EMS, pt complains of right knee pain since hyperextending knee when falling this morning. Pt states she slid out of bed. No other injuries. 8/10 pain in right knee. EXAM: RIGHT KNEE - COMPLETE 4+ VIEW COMPARISON:  11/18/2017 FINDINGS: Remote knee arthroplasty and ORIF of the distal RIGHT femur. Hardware is intact. No acute fracture. Trace joint effusion. IMPRESSION: 1. No acute fracture. 2. Hardware intact. Electronically Signed: By: Nolon Nations M.D. On: 02/13/2018 15:07   Dg Hip Unilat W Or Wo Pelvis 2-3 Views Right  Result Date: 02/13/2018 CLINICAL DATA:  Right hip pain after fall today EXAM: DG HIP (WITH OR WITHOUT PELVIS) 2-3V RIGHT COMPARISON:  None. FINDINGS: There is no evidence of hip fracture or dislocation. Moderate narrowing right hip joint is noted with osteophyte formation. IMPRESSION: Moderate degenerative joint disease of the right hip. No acute  abnormality seen. Electronically Signed   By: Marijo Conception, M.D.   On: 02/13/2018 16:37   Dg Femur Min 2 Views Right  Result Date: 02/13/2018 CLINICAL DATA:  Right knee pain after fall. EXAM: RIGHT FEMUR 2 VIEWS COMPARISON:  Radiographs of November 18, 2017. FINDINGS: Status post surgical internal fixation of old distal right femoral fracture. However, there does appear to be a new minimally displaced oblique fracture involving the distal right femoral shaft in this area. Status post right total knee arthroplasty.  IMPRESSION: New minimally displaced oblique fracture seen involving the distal right femoral shaft in area that has previously undergone surgical internal fixation. Electronically Signed   By: Marijo Conception, M.D.   On: 02/13/2018 16:44    Assessment/Plan  #1 history of right supracondylar femur fracture distal- as stated above no surgery was thought to be needed should she is here for therapy-at this point Percocet appears to be helping for pain she also has an order for Robaxin as needed   #2 history of hypertension at this point we have minimal readings she is on Norvasc will monitor.  3.  History of asthma she is on routine DuoNeb she says she has been on these for years and well-tolerated she has a small amount of wheezing but she says this is nothing new denies any shortness of breath at this point will monitor.  4.  History of diastolic CHF thought to be grade 1 per echo done in 2018 she is now on Lasix 40 mg 3 times a week at this point will monitor edema appears to be fairly minimal she is not complaining of any shortness of breath.  5.  History of anemia unclear etiology hemoglobin has gone down slightly at 8.7 will have that updated later this week- pending those results consider obtaining additional i studies- she is on iron.  6.  History of type 2 diabetes this apparently is diet-controlled appeared to be quite stable in the hospital largely in the lower 100s.  7.  History of neuropathy she is on Neurontin 3 times a day- at this point will monitor.  8.  History of seizure disorder she is on Keppra apparently this is been stable for some time as well.  9.  History of dementia this appears fairly mild-apparently she was noncompliant with her mobility devices at home- she does live with her daughter at this point continue supportive care she is on Aricept.  10.  History of glaucoma with right eye blindness apparently she is followed by ophthalmology she continues on topical  drops.  11.  History of chronic kidney disease this appears stable with a creatinine of 1.17 on lab today we will check this later this week.  12.  History of CVA she continues on Plavix.  As well as a statin  13.  History depression with anxiety she continues on Paxil-she is also on Xanax 3 times a day for chronic anxiety.  14.  History of constipation she is on Colace twice daily in addition to MiraLAX.  [id number] note greater than 35 minutes spent assessing patient-reviewing her chart and labs-coordinating and formulating plan of care for numerous diagnoses--of note greater than 50% of time spent coordinating a plan of care with input as noted above

## 2018-02-16 NOTE — Discharge Summary (Signed)
[facility] Discharge Summary   Patient: Tiffany Hartman JAS:[id number]   PCP: Rosita Fire, MD DOB: September 25, 1927   Date of admission: 02/13/2018   Date of discharge:  02/16/2018    Discharge Diagnoses:   Principal Problem:   Femoral distal fracture ([place]) Active Problems:   DDD (degenerative disc disease), lumbar   Parkinsons disease ([place])   Seizure disorder ([place])   Obesity   Diabetes ([place])   Asthma   Admitted From: home Disposition:  SNF  Recommendations for Outpatient Follow-up:  1. Please follow up with PCP in 2 week and orthopedics in 2 week    Contact information for follow-up providers    Rosita Fire, MD. Schedule an appointment as soon as possible for a visit in 1 week(s).   Specialty:  Internal Medicine Contact information: [place] [address] [phone]        Gaynelle Arabian, MD. Schedule an appointment as soon as possible for a visit in 2 week(s).   Specialty:  Orthopedic Surgery Contact information: [address] [place] [address] [phone]            Contact information for after-discharge care    [facility] .   Service:  Skilled Nursing Contact information: [address] [place] [place] [phone]                 Diet recommendation: cardiac diet  Activity: The patient is advised to gradually reintroduce usual activities.  Partial weightbearing 50% RLE  Discharge Condition: good  Code Status: full code  History of present illness: As per the H and P dictated on admission, "Tiffany Hartman is a 85 y.o. female with medical history significant of dementia, asthma, hypertension, osteoarthritis status post bilateral total knee replacement, diabetes, Parkinson's disease, previous CVA and glaucoma who apparently was at home and walking around after coming from bathroom early morning without any support.  Patient sustained a  fall.  She injured her knees.  She came to the ER where she was evaluated.  Patient is not a good historian but daughter at bedside.  She has not been compliant apparently with use of mobility devices..  Patient was always refused to listen.  Orthopedics consulted and fracture deemed nonsurgical.  Patient admitted to the medical service for pain control as well as supportive care.  Denied any numbness.  Denied any other injury.  ED Course: Temperature is 98.1 blood pressure 128/54 pulse 84 respiratory rate of 18 oxygen sat 100% room air.  X-ray of the right knee showed acute minimally displaced fracture involving the distal right femoral shaft in the area that was previously under good fixation.  X-ray of the hip showed moderate degenerative disease but no acute findings.  Orthopedic consulted and no surgery planned.  Patient therefore admitted for treatment."  Hospital Course:  Summary of her active problems in the hospital is as following. Right supracondylar femur fracture Non-displaced  Per ortho protected by plate, stable injury which will not require surgical intervention;  treatment of PT and pain control recommended 50% PWB RLE SNF arranged  Pain control with percocet and bowel regimen ordered.   Status post fall: Mechanical Gait abnormality probably from Parkinson's and previous surgery as well as dementia.    No diabetes Hyperglycemia  Initial hyperglycemia likely stress induced  Now Blood sugar appears controlled.  Seizure disorder:  No seizure here Continue home regimen.  Acute asthma exacerbation  Chronic respi failure with hypoxia Influenza negative Continue duoneb and supportive measure Continue oxygen on discharge.   Parkinson's disease:  Appears to be at baseline.  No change in therapy.  Degenerative disc disease:  Multiple sites.  She has osteoarthritis also.  Conservative measures only.  hypertension:  Continue home blood pressure  medications.  Chronic HFpEF Continue lasix-changed from daily to 3 times week.  CKD stage III Renal function baseline Monitor   Chronic anemia  Etiology not clear No bleeding here Outpatient work up with PCP recommended   Obesity Body mass index is 38.67 kg/m.  Outpatient dietary consult.   Pain control  - [employer] Controlled Substance Reporting System database was reviewed. - Last prescription for controlled substance was on 01/25/2018 for xanax and narcotics on 11/18/2017. - 5 day supply was provided. - Patient was instructed, not to drive, operate heavy machinery, perform activities at heights, swimming or participation in water activities or provide baby sitting services while on Pain, Sleep and Anxiety Medications; until her outpatient Physician has advised to do so again.  - Also recommended to not to take more than prescribed Pain, Sleep and Anxiety Medications.  Patient was seen by physical therapy, who recommended SNF, which was arranged by social worker On the day of the discharge the patient's vitals were stable , and no other acute medical condition were reported by patient. the patient was felt safe to be discharge at SNF  with therapy.  Consultants: orthopedics  Procedures: none  DISCHARGE MEDICATION: Allergies as of 02/16/2018   No Known Allergies     Medication List    STOP taking these medications   HYDROcodone-acetaminophen 5-325 MG tablet Commonly known as:  NORCO/VICODIN   losartan 100 MG tablet Commonly known as:  COZAAR   potassium chloride SA 20 MEQ tablet Commonly known as:  K-DUR,KLOR-CON     TAKE these medications   albuterol 108 (90 Base) MCG/ACT inhaler Commonly known as:  PROVENTIL HFA;VENTOLIN HFA Inhale 1-2 puffs into the lungs every 6 (six) hours as needed for wheezing or shortness of breath.   ALPRAZolam 0.25 MG tablet Commonly known as:  XANAX Take 0.25 mg by mouth 3 (three) times daily.   amLODipine 5 MG  tablet Commonly known as:  NORVASC Take 5 mg by mouth daily.   benzonatate 100 MG capsule Commonly known as:  TESSALON Take 1 capsule (100 mg total) by mouth 3 (three) times daily.   Calcium Carbonate-Vitamin D 600-200 MG-UNIT Tabs Take 1 tablet by mouth every evening.   clopidogrel 75 MG tablet Commonly known as:  PLAVIX Take 1 tablet (75 mg total) by mouth daily with breakfast.   docusate sodium 100 MG capsule Commonly known as:  COLACE Take 1 capsule (100 mg total) by mouth 2 (two) times daily.   donepezil 10 MG tablet Commonly known as:  ARICEPT Take 10 mg by mouth at bedtime.   erythromycin ophthalmic ointment Place 1 application into the right eye 2 (two) times daily.   ferrous gluconate 325 MG tablet Commonly known as:  FERGON Take 325 mg by mouth daily with breakfast.   FLUoxetine 20 MG capsule Commonly known as:  PROZAC Take 20 mg by mouth daily.   folic acid 1 MG tablet Commonly known as:  FOLVITE Take 1 mg by mouth daily.   furosemide 40 MG tablet Commonly known as:  LASIX Take 1 tablet (40 mg total) by mouth 3 (three) times a week. Start taking on:  February 17, 2018 What changed:  when to take this   gabapentin 300 MG capsule Commonly known as:  NEURONTIN Take 1 capsule (300 mg total) by mouth 3 (three) times daily.   guaiFENesin 100 MG/5ML Soln Commonly known as:  ROBITUSSIN Take 5 mLs (100 mg total) by mouth every 4 (four) hours as needed for cough or to loosen phlegm.   ipratropium-albuterol 0.5-2.5 (3) MG/3ML Soln Commonly known as:  DUONEB Take 3 mLs by nebulization 4 (four) times daily.   latanoprost 0.005 % ophthalmic solution Commonly known as:  XALATAN Place 1 drop into both eyes at bedtime.   levETIRAcetam 250 MG tablet Commonly known as:  KEPPRA Take 250 mg by mouth 2 (two) times daily.   lovastatin 10 MG tablet Commonly known as:  MEVACOR Take 10 mg by mouth at bedtime.   methocarbamol 500 MG tablet Commonly known as:   ROBAXIN Take 1 tablet (500 mg total) by mouth every 8 (eight) hours as needed for muscle spasms.   omeprazole 20 MG capsule Commonly known as:  PRILOSEC Take 20 mg by mouth 2 (two) times daily.   oxyCODONE-acetaminophen 5-325 MG tablet Commonly known as:  PERCOCET/ROXICET Take 1 tablet by mouth every 6 (six) hours as needed for up to 5 days for moderate pain.   polyethylene glycol packet Commonly known as:  MIRALAX / GLYCOLAX Take 17 g by mouth daily. Start taking on:  February 17, 2018   prednisoLONE acetate 1 % ophthalmic suspension Commonly known as:  PRED FORTE Place 1 drop into the left eye daily.      No Known Allergies Discharge Instructions    Diet - low sodium heart healthy   Complete by:  As directed    Discharge instructions   Complete by:  As directed    It is important that you read the given instructions as well as go over your medication list with RN to help you understand your care after this hospitalization.  Discharge Instructions: Please follow-up with PCP in 1-2 weeks  Please request your primary care physician to go over all Hospital Tests and Procedure/Radiological results at the follow up. Please get all Hospital records sent to your PCP by signing hospital release before you go home.   Do not drive, operating heavy machinery, perform activities at heights, swimming or participation in water activities or provide baby sitting services while you are on Pain, Sleep and Anxiety Medications; until you have been seen by Primary Care Physician or a Neurologist and advised to do so again. Do not take more than prescribed Pain, Sleep and Anxiety Medications. You were cared for by a hospitalist during your hospital stay. If you have any questions about your discharge medications or the care you received while you were in the hospital after you are discharged, you can call the unit @UNIT @ you were admitted to and ask to speak with the hospitalist on call if the  hospitalist that took care of you is not available.  Once you are discharged, your primary care physician will handle any further medical issues. Please note that NO REFILLS for any discharge medications will be authorized once you are discharged, as it is imperative that you return to your primary care physician (or establish a relationship with a primary care physician if you do not have one) for your aftercare needs so that they can reassess your need for medications and monitor your lab values. You Must read complete instructions/literature along with all the possible adverse reactions/side effects for all the Medicines you  take and that have been prescribed to you. Take any new Medicines after you have completely understood and accept all the possible adverse reactions/side effects. Wear Seat belts while driving. If you have smoked or chewed Tobacco in the last 2 yrs please stop smoking and/or stop any Recreational drug use.  If you drink alcohol, please moderate the use and do not drive, operating heavy machinery, perform activities at heights, swimming or participation in water activities or provide baby sitting services under influence.   Increase activity slowly   Complete by:  As directed      Discharge Exam: Filed Weights   02/13/18 2004  Weight: 102.2 kg   Vitals:   02/16/18 0613 02/16/18 0848  BP: (!) 171/58 (!) 164/74  Pulse: 89 89  Resp: 20   Temp: 98.7 F (37.1 C)   SpO2: 100% 100%   General: Appear in no distress, no Rash; Oral Mucosa moist. Cardiovascular: S1 and S2 Present, no Murmur, no JVD Respiratory: Bilateral Air entry present and no Crackles, Occasional wheezes Abdomen: Bowel Sound present, Soft and no tenderness Extremities: no Pedal edema, no calf tenderness Neurology: Grossly no focal neuro deficit.  The results of significant diagnostics from this hospitalization (including imaging, microbiology, ancillary and laboratory) are listed below for reference.     Significant Diagnostic Studies: Dg Knee Complete 4 Views Right  Addendum Date: 02/13/2018   ADDENDUM REPORT: 02/13/2018 16:46 ADDENDUM: Upon further review, there does appear to be an acute minimally displaced fracture involving the distal right femoral shaft in the area that that has previously undergone internal fixation. I discussed this finding with Dr. Owens Shark. This finding is much more easily apparent on the femur series than the knee series. Electronically Signed   By: Marijo Conception, M.D.   On: 02/13/2018 16:46   Result Date: 02/13/2018 CLINICAL DATA:  Per EMS, pt complains of right knee pain since hyperextending knee when falling this morning. Pt states she slid out of bed. No other injuries. 8/10 pain in right knee. EXAM: RIGHT KNEE - COMPLETE 4+ VIEW COMPARISON:  11/18/2017 FINDINGS: Remote knee arthroplasty and ORIF of the distal RIGHT femur. Hardware is intact. No acute fracture. Trace joint effusion. IMPRESSION: 1. No acute fracture. 2. Hardware intact. Electronically Signed: By: Nolon Nations M.D. On: 02/13/2018 15:07   Dg Hip Unilat W Or Wo Pelvis 2-3 Views Right  Result Date: 02/13/2018 CLINICAL DATA:  Right hip pain after fall today EXAM: DG HIP (WITH OR WITHOUT PELVIS) 2-3V RIGHT COMPARISON:  None. FINDINGS: There is no evidence of hip fracture or dislocation. Moderate narrowing right hip joint is noted with osteophyte formation. IMPRESSION: Moderate degenerative joint disease of the right hip. No acute abnormality seen. Electronically Signed   By: Marijo Conception, M.D.   On: 02/13/2018 16:37   Dg Femur Min 2 Views Right  Result Date: 02/13/2018 CLINICAL DATA:  Right knee pain after fall. EXAM: RIGHT FEMUR 2 VIEWS COMPARISON:  Radiographs of November 18, 2017. FINDINGS: Status post surgical internal fixation of old distal right femoral fracture. However, there does appear to be a new minimally displaced oblique fracture involving the distal right femoral shaft in this area. Status  post right total knee arthroplasty. IMPRESSION: New minimally displaced oblique fracture seen involving the distal right femoral shaft in area that has previously undergone surgical internal fixation. Electronically Signed   By: Marijo Conception, M.D.   On: 02/13/2018 16:44    Microbiology: No results found for this or any  previous visit (from the past 240 hour(s)).   Labs: CBC: Recent Labs  Lab 02/13/18 1839 02/14/18 0435 02/16/18 0417  WBC 9.0 7.3 7.3  NEUTROABS 6.0  --  4.1  HGB 9.9* 9.0* 8.7*  HCT 33.8* 31.3* 30.5*  MCV 96.8 98.4 99.0  PLT 332 300 712   Basic Metabolic Panel: Recent Labs  Lab 02/13/18 1839 02/14/18 0435 02/16/18 0417  NA 141 141 138  K 4.7 4.6 5.0  CL 103 105 106  CO2 30 30 25   GLUCOSE 103* 130* 115*  BUN 17 22 19   CREATININE 1.40* 1.48* 1.17*  CALCIUM 8.7* 8.5* 8.4*  MG  --   --  1.8   Liver Function Tests: Recent Labs  Lab 02/14/18 0435 02/16/18 0417  AST 10* 10*  ALT 10 9  ALKPHOS 55 51  BILITOT 0.5 0.3  PROT 5.5* 5.4*  ALBUMIN 2.9* 2.8*   No results for input(s): LIPASE, AMYLASE in the last 168 hours. No results for input(s): AMMONIA in the last 168 hours. Cardiac Enzymes: No results for input(s): CKTOTAL, CKMB, CKMBINDEX, TROPONINI in the last 168 hours. BNP (last 3 results) No results for input(s): BNP in the last 8760 hours. CBG: Recent Labs  Lab 02/14/18 1156 02/14/18 1653 02/14/18 2223 02/15/18 0706 02/15/18 1147  GLUCAP 156* 111* 108* 102* 107*   Time spent: 35 minutes  Signed:  Berle Mull  [facility]  02/16/2018

## 2018-02-16 NOTE — Progress Notes (Signed)
At [place], [facility] was provided with report. [facility] is currently here to transport the pt to [facility].

## 2018-02-17 ENCOUNTER — Ambulatory Visit: Payer: Medicare Other | Admitting: Orthopedic Surgery

## 2018-02-17 ENCOUNTER — Other Ambulatory Visit: Payer: Self-pay

## 2018-02-17 ENCOUNTER — Telehealth: Payer: Self-pay | Admitting: Orthopedic Surgery

## 2018-02-17 MED ORDER — ALPRAZOLAM 0.25 MG PO TABS: 0.2500 mg | ORAL_TABLET | Freq: Three times a day (TID) | ORAL | 0 refills | Status: DC | PRN

## 2018-02-17 MED ORDER — OXYCODONE-ACETAMINOPHEN 5-325 MG PO TABS: 1.0000 | ORAL_TABLET | Freq: Four times a day (QID) | ORAL | 0 refills | Status: AC | PRN

## 2018-02-17 NOTE — Telephone Encounter (Signed)
FYI: Patient's son Herbie [place] called stating that his mom was admitted Saturday at [facility] and they have now sent her to [facility]

## 2018-02-17 NOTE — Telephone Encounter (Signed)
Patient's son canceled Tiffany Hartman' appointment today with Dr. Aline Brochure.

## 2018-02-17 NOTE — Telephone Encounter (Signed)
RX Fax for [facility]@ [phone]  

## 2018-02-18 ENCOUNTER — Non-Acute Institutional Stay (SKILLED_NURSING_FACILITY): Payer: Medicare Other | Admitting: Internal Medicine

## 2018-02-18 ENCOUNTER — Encounter (HOSPITAL_COMMUNITY)
Admission: RE | Admit: 2018-02-18 | Discharge: 2018-02-18 | Disposition: A | Discharge status: Home / Self Care | Payer: Medicare Other | Source: Skilled Nursing Facility | Attending: Internal Medicine | Admitting: Internal Medicine

## 2018-02-18 ENCOUNTER — Encounter: Payer: Self-pay | Admitting: Internal Medicine

## 2018-02-18 DIAGNOSIS — G40909 Epilepsy, unspecified, not intractable, without status epilepticus: Secondary | ICD-10-CM | POA: Diagnosis not present

## 2018-02-18 DIAGNOSIS — J42 Unspecified chronic bronchitis: Secondary | ICD-10-CM | POA: Diagnosis not present

## 2018-02-18 DIAGNOSIS — I10 Essential (primary) hypertension: Secondary | ICD-10-CM | POA: Diagnosis not present

## 2018-02-18 DIAGNOSIS — J449 Chronic obstructive pulmonary disease, unspecified: Secondary | ICD-10-CM | POA: Insufficient documentation

## 2018-02-18 DIAGNOSIS — E669 Obesity, unspecified: Secondary | ICD-10-CM | POA: Insufficient documentation

## 2018-02-18 DIAGNOSIS — S72401D Unspecified fracture of lower end of right femur, subsequent encounter for closed fracture with routine healing: Secondary | ICD-10-CM | POA: Diagnosis not present

## 2018-02-18 DIAGNOSIS — D649 Anemia, unspecified: Secondary | ICD-10-CM | POA: Insufficient documentation

## 2018-02-18 DIAGNOSIS — E1122 Type 2 diabetes mellitus with diabetic chronic kidney disease: Secondary | ICD-10-CM | POA: Insufficient documentation

## 2018-02-18 LAB — CBC WITH DIFFERENTIAL/PLATELET
Abs Immature Granulocytes: 0.01 10*3/uL (ref 0.00–0.07)
Basophils Absolute: 0 10*3/uL (ref 0.0–0.1)
Basophils Relative: 1 %
Eosinophils Absolute: 0.6 10*3/uL — ABNORMAL HIGH (ref 0.0–0.5)
Eosinophils Relative: 8 %
HCT: 31.2 % — ABNORMAL LOW (ref 36.0–46.0)
HEMOGLOBIN: 9.2 g/dL — AB (ref 12.0–15.0)
Immature Granulocytes: 0 %
Lymphocytes Relative: 23 %
Lymphs Abs: 1.6 10*3/uL (ref 0.7–4.0)
MCH: 27.7 pg (ref 26.0–34.0)
MCHC: 29.5 g/dL — ABNORMAL LOW (ref 30.0–36.0)
MCV: 94 fL (ref 80.0–100.0)
Monocytes Absolute: 0.5 10*3/uL (ref 0.1–1.0)
Monocytes Relative: 7 %
Neutro Abs: 4.3 10*3/uL (ref 1.7–7.7)
Neutrophils Relative %: 61 %
Platelets: 299 10*3/uL (ref 150–400)
RBC: 3.32 MIL/uL — AB (ref 3.87–5.11)
RDW: 14.1 % (ref 11.5–15.5)
WBC: 7 10*3/uL (ref 4.0–10.5)
nRBC: 0 % (ref 0.0–0.2)

## 2018-02-18 LAB — BASIC METABOLIC PANEL
Anion gap: 8 (ref 5–15)
BUN: 15 mg/dL (ref 8–23)
CO2: 30 mmol/L (ref 22–32)
Calcium: 9.1 mg/dL (ref 8.9–10.3)
Chloride: 102 mmol/L (ref 98–111)
Creatinine, Ser: 1.06 mg/dL — ABNORMAL HIGH (ref 0.44–1.00)
GFR calc Af Amer: 54 mL/min — ABNORMAL LOW (ref >60)
GFR calc non Af Amer: 47 mL/min — ABNORMAL LOW (ref >60)
GLUCOSE: 135 mg/dL — AB (ref 70–99)
Potassium: 4 mmol/L (ref 3.5–5.1)
Sodium: 140 mmol/L (ref 135–145)

## 2018-02-18 NOTE — Progress Notes (Signed)
Provider: Veleta Miners MD  Location:    [facility] Room Number: 102/P Place of Service:  SNF (31)  PCP: Rosita Fire, MD Patient Care Team: Rosita Fire, MD as PCP - General (Internal Medicine) Danie Binder, MD (Gastroenterology)  Extended Emergency Contact Information Primary Emergency Contact: Alvira Monday Address: [address]          [place], [place] [address] [place] Phone: [phone] Work Phone: [phone] Mobile Phone: [phone] Relation: Daughter Secondary Emergency Contact: [place] Phone: [phone] Relation: Daughter  Code Status: Full Code Goals of Care: Advanced Directive information Advanced Directives 02/18/2018  Does Patient Have a Medical Advance Directive? Yes  Type of Advance Directive (No Data)  Does patient want to make changes to medical advance directive? No - Patient declined  Would patient like information on creating a medical advance directive? No - Patient declined  Pre-existing out of facility DNR order (yellow form or pink MOST form) -      Chief Complaint  Patient presents with  . New Admit To SNF    New admission Visit    HPI: Patient is a 85 y.o. female seen today for admission to Facility For therapy . She was in the hospital 02/08-02/65for Right Supracondylar Fracture  Patient has a history of COPD, hypertension, chronic diastolic heart failure, CKD, anemia, chronic pain, status post knee replacement and status post periprostatic fracture repaired with plates 2 decades ago.and Benign Tremors H/o Breast Cancer 30 years Ago.Seizure Disorder Patient lives with her daughter.  She says she does not remember but she was trying to get on her hospital bed and fell.   Patient was found to have a right supracondylar femur fracture which was nondisplaced.  Per Ortho it was protected by the plate and was stable injury.  She did not require any  surgical intervention.  She is 50% PWB.  Her pain is controlled. Patient is doing well.  Did not did have some dry cough but denied any shortness of breath, fever ,chest pain She lives with her daughter it is independent in her ADLs walks with a walker and usually stays home  Past Medical History:  Diagnosis Date  . Asthma   . Breast cancer ([place]) 02/20/83   LT mastectomy  . Bronchitis   . Dementia ([place])   . Diabetes mellitus ([place])   . Diverticulitis 2009  . Glaucoma   . HTN (hypertension)   . OA (osteoarthritis)   . Obesity 08/17/2014  . Parkinson disease ([place])   . Stroke ([place])   . Thyroid nodule    Dr Legrand Rams   Past Surgical History:  Procedure Laterality Date  .  bilateral catracts    . ABDOMINAL HYSTERECTOMY     complete  . APPENDECTOMY    . EUS  09/10/2011   Procedure: UPPER ENDOSCOPIC ULTRASOUND (EUS) RADIAL;  Surgeon: Arta Silence, MD;  [facility];  Service: Endoscopy;  Laterality: N/A;  christina/ebp  . FINE NEEDLE ASPIRATION  09/10/2011   Procedure: FINE NEEDLE ASPIRATION (FNA) LINEAR;  Surgeon: Arta Silence, MD;  [facility];  Service: Endoscopy;  Laterality: N/A;  . FRACTURE SURGERY  2008    right and left femurs  . JOINT REPLACEMENT  1998/1999   knee boths  . KNEE SURGERY Right    TKA  . KNEE SURGERY Left    TKA  . KNEE SURGERY Right    Periprosthetic fracture /otif  . MASTECTOMY  02/20/1983   left   . PARS PLANA VITRECTOMY Right 06/06/2016   Procedure: VITRECTOMY WITH CULTURES AND INJECTION OF ANTIBIOTICS; ANTERIOR CHAMBER WASHOUT;  Surgeon: Jalene Mullet, MD;  Location: [facility];  Service: Ophthalmology;  Laterality: Right;    reports that she has never smoked. She has never used smokeless tobacco. She reports that she does not drink alcohol or use drugs. Social History   Socioeconomic History  . Marital status: Widowed    Spouse name: Not on file  . Number of children: 6  . Years of education: 64  . Highest education level: Not on  file  Occupational History  . 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 Financial resource strain: Not on file  . Food insecurity:    Worry: Not on file    Inability: Not on file  . Transportation needs:    Medical: Not on file    Non-medical: Not on file  Tobacco Use  . Smoking status: Never Smoker  . Smokeless tobacco: Never Used  Substance and Sexual Activity  . Alcohol use: No  . Drug use: No  . Sexual activity: Never    Birth control/protection: Surgical  Lifestyle  . Physical activity:    Days per week: Not on file    Minutes per session: Not on file  . Stress: Not on file  Relationships  . Social connections:    Talks on phone: Not on file    Gets together: Not on file    Attends religious service: Not on file    Active member of club or organization: Not on file    Attends meetings of clubs or organizations: Not on file    Relationship status: Not on file  . Intimate partner violence:    Fear of current or ex partner: Not on file    Emotionally abused: Not on file    Physically abused: Not on file    Forced sexual activity: Not on file  Other Topics Concern  . Not on file  Social History Narrative   Lives w/ daughter Carolynn Comment).   Retired   Right handed.   Education high school   Caffeine None             Functional Status Survey:    Family History  Problem Relation Age of Onset  . Huntington's disease Mother   . Stroke Father   . Colon polyps Daughter 57  . Throat cancer Son     Health Maintenance  Topic Date Due  . INFLUENZA VACCINE  03/17/2018 (Originally 08/06/2017)  . FOOT EXAM  03/17/2018 (Originally [birth year]09/1937)  . HEMOGLOBIN A1C  03/17/2018 (Originally 02/11/2013)  . OPHTHALMOLOGY EXAM  03/17/2018 (Originally 05/09/1939)  . URINE MICROALBUMIN  03/17/2018 (Originally 05/09/1939)  . DEXA SCAN  03/17/2018 (Originally 05/09/1994)  . TETANUS/TDAP  03/17/2018 (Originally 05/08/1948)  . PNA vac Low Risk Adult (1 of 2 - PCV13) 03/17/2018 (Originally  05/09/1994)    No Known Allergies  Outpatient Encounter Medications as of 02/18/2018  Medication Sig  . albuterol (PROVENTIL HFA;VENTOLIN HFA) 108 (90 Base) MCG/ACT inhaler Inhale 2 puffs into the lungs every 6 (six) hours as needed for wheezing or shortness of breath.   . ALPRAZolam (XANAX) 0.25 MG tablet Take 1 tablet (0.25 mg total) by mouth 3 (three) times daily as needed for anxiety or sleep.  Marland Kitchen amLODipine (NORVASC) 5 MG tablet Take 5 mg by mouth daily.  . benzonatate (TESSALON) 100 MG capsule Take 1 capsule (100 mg  total) by mouth 3 (three) times daily.  . Calcium Carbonate-Vitamin D (CALCIUM + D) 600-200 MG-UNIT TABS Take 1 tablet by mouth every evening.   . clopidogrel (PLAVIX) 75 MG tablet Take 1 tablet (75 mg total) by mouth daily with breakfast.  . docusate sodium (COLACE) 100 MG capsule Take 1 capsule (100 mg total) by mouth 2 (two) times daily.  Marland Kitchen donepezil (ARICEPT) 10 MG tablet Take 10 mg by mouth at bedtime.  Marland Kitchen erythromycin ophthalmic ointment Place 1 application into the right eye 2 (two) times daily.  . ferrous gluconate (FERGON) 324 MG tablet Take 324 mg by mouth daily with breakfast.  . FLUoxetine (PROZAC) 20 MG capsule Take 20 mg by mouth daily.  . folic acid (FOLVITE) 1 MG tablet Take 1 mg by mouth daily.  . furosemide (LASIX) 40 MG tablet Take 1 tablet (40 mg total) by mouth 3 (three) times a week.  . gabapentin (NEURONTIN) 300 MG capsule Take 1 capsule (300 mg total) by mouth 3 (three) times daily.  Marland Kitchen guaiFENesin (ROBITUSSIN) 100 MG/5ML SOLN Take 5 mLs (100 mg total) by mouth every 4 (four) hours as needed for cough or to loosen phlegm.  Marland Kitchen ipratropium-albuterol (DUONEB) 0.5-2.5 (3) MG/3ML SOLN Take 3 mLs by nebulization 4 (four) times daily.  Marland Kitchen latanoprost (XALATAN) 0.005 % ophthalmic solution Place 1 drop into both eyes at bedtime.  . levETIRAcetam (KEPPRA) 250 MG tablet Take 250 mg by mouth 2 (two) times daily.  Marland Kitchen lovastatin (MEVACOR) 10 MG tablet Take 10 mg by mouth  at bedtime.  . methocarbamol (ROBAXIN) 500 MG tablet Take 1 tablet (500 mg total) by mouth every 8 (eight) hours as needed for muscle spasms.  Marland Kitchen omeprazole (PRILOSEC) 20 MG capsule Take 20 mg by mouth 2 (two) times daily.   Marland Kitchen oxyCODONE-acetaminophen (PERCOCET/ROXICET) 5-325 MG tablet Take 1 tablet by mouth every 6 (six) hours as needed for up to 5 days for moderate pain.  . polyethylene glycol (MIRALAX / GLYCOLAX) packet Take 17 g by mouth daily.  . prednisoLONE acetate (PRED FORTE) 1 % ophthalmic suspension Place 1 drop into the left eye daily.    No facility-administered encounter medications on file as of 02/18/2018.      Review of Systems  Constitutional: Negative.   HENT: Negative.   Respiratory: Positive for cough.   Cardiovascular: Negative.   Gastrointestinal: Positive for diarrhea.  Genitourinary: Positive for frequency.  Musculoskeletal: Positive for arthralgias.  Skin: Negative.   Neurological: Negative.   Psychiatric/Behavioral: Negative.   All other systems reviewed and are negative.   Vitals:   02/18/18 1012  BP: (!) 155/75  Pulse: 80  Resp: 20  Temp: 97.8 F (36.6 C)  TempSrc: Oral  Weight: 231 lb (104.8 kg)  Height: 5\' 4"  (1.626 m)   Body mass index is 39.65 kg/m. Physical Exam Constitutional:      Appearance: Normal appearance.  HENT:     Head: Normocephalic.     Nose: Nose normal.     Mouth/Throat:     Mouth: Mucous membranes are moist.     Pharynx: Oropharynx is clear.  Eyes:     Pupils: Pupils are equal, round, and reactive to light.  Neck:     Musculoskeletal: Neck supple.  Cardiovascular:     Rate and Rhythm: Normal rate and regular rhythm.     Pulses: Normal pulses.     Heart sounds: Normal heart sounds.  Pulmonary:     Effort: Pulmonary effort is normal. No respiratory  distress.     Breath sounds: Normal breath sounds. No wheezing.  Abdominal:     General: Abdomen is flat. Bowel sounds are normal. There is no distension.      Palpations: Abdomen is soft.     Tenderness: There is no abdominal tenderness. There is no guarding.  Musculoskeletal:        General: No swelling.  Skin:    General: Skin is warm and dry.  Neurological:     General: No focal deficit present.     Mental Status: She is alert and oriented to person, place, and time.     Comments: Has tenderness in Right LE near her Knee Has Essential Trmor  Psychiatric:        Mood and Affect: Mood normal.        Thought Content: Thought content normal.        Judgment: Judgment normal.     Labs reviewed: Basic Metabolic Panel: Recent Labs    02/14/18 0435 02/16/18 0417 02/18/18 0700  NA 141 138 140  K 4.6 5.0 4.0  CL 105 106 102  CO2 30 25 30   GLUCOSE 130* 115* 135*  BUN 22 19 15   CREATININE 1.48* 1.17* 1.06*  CALCIUM 8.5* 8.4* 9.1  MG  --  1.8  --    Liver Function Tests: Recent Labs    02/14/18 0435 02/16/18 0417  AST 10* 10*  ALT 10 9  ALKPHOS 55 51  BILITOT 0.5 0.3  PROT 5.5* 5.4*  ALBUMIN 2.9* 2.8*   No results for input(s): LIPASE, AMYLASE in the last 8760 hours. No results for input(s): AMMONIA in the last 8760 hours. CBC: Recent Labs    02/13/18 1839 02/14/18 0435 02/16/18 0417 02/18/18 0700  WBC 9.0 7.3 7.3 7.0  NEUTROABS 6.0  --  4.1 4.3  HGB 9.9* 9.0* 8.7* 9.2*  HCT 33.8* 31.3* 30.5* 31.2*  MCV 96.8 98.4 99.0 94.0  PLT 332 300 279 299   Cardiac Enzymes: No results for input(s): CKTOTAL, CKMB, CKMBINDEX, TROPONINI in the last 8760 hours. BNP: Invalid input(s): POCBNP Lab Results  Component Value Date   HGBA1C 6.5 (H) 08/11/2012    Lab Results  Component Value Date   VITAMINB12 358 05/16/2009   No results found for: FOLATE No results found for: IRON, TIBC, FERRITIN  Imaging and Procedures obtained prior to SNF admission: No results found.  Assessment/Plan Closed fracture of distal end of right femur  Per ortho No surgery Intervention PWB for Now Continue therapy Follow up with  [facility]  Essential hypertension Mildly Elevated On Norvasc and Lasix  COPD Stable On Bronchodilators BID and PRN On Chronic Oxygen  Seizure disorder  On Kepprra Anxiety and Depression On Prozac and Ativan TID on Chronic Basis Chronic Pain On Neurontin and Oxycodone Anemia On Iron Supplement   Family/ staff Communication:   Labs/tests ordered:  Total time spent in this patient care encounter was 45_ minutes; greater than 50% of the visit spent counseling patient, reviewing records , Labs and coordinating care for problems addressed at this encounter.

## 2018-02-19 ENCOUNTER — Telehealth: Payer: Self-pay | Admitting: Radiology

## 2018-02-19 NOTE — Telephone Encounter (Signed)
Daughter called, since increasing therapy there has been some swelling of patients upper arm. She is moving it better, but some swelling was noted, daughter wants to know if ice is okay. I have advised ice is fine, but if she does not like the ice, not to use it. She has voiced understanding  To you FYI

## 2018-02-23 ENCOUNTER — Other Ambulatory Visit: Payer: Self-pay | Admitting: *Deleted

## 2018-02-23 NOTE — Patient Outreach (Signed)
Call attempted to daughter, [name] [phone], no answer, left VM with RNCM contact.

## 2018-02-23 NOTE — Patient Outreach (Signed)
[facility]) Care Management  02/23/2018  Tiffany Hartman 27-May-1927 [id number]   Onsite visit to facility.  IDT meet. Per facility staff patient will tentatively discharge next week, she has a supportive daughter. She already has most of her DME, but daughter is requesting a hospital bed, facility thinks she should qualify for one.  She will have advanced home care.  Met with patient in her room. Discussed [facility] care management, patient requesting RNCM speak with daughter.  Attempted to call daughter but she had a 9:30 appointment.   Plan to follow up with daughter, left a packet for patient with RNCM contact.  Royetta Crochet. Laymond Purser, MSN, RN, [employer] , [place] [phone]) Business Cell  [phone]) Toll Free Office

## 2018-02-23 NOTE — Patient Outreach (Signed)
[facility]) Care Management  02/23/2018  Tiffany Hartman 1927-07-05 [id number]   Call back from patient daughter, Deneise Lever. RNCM reviewed [facility] care management program. Deneise Lever will review the information packet left at bedside. She will keep for future reference at this time she denies any care management needs.   Plan to sign off. Royetta Crochet. Laymond Purser, MSN, RN, [employer] , [place] [phone]) Business Cell  [phone]) Toll Free Office

## 2018-02-25 ENCOUNTER — Encounter (HOSPITAL_COMMUNITY)
Admission: RE | Admit: 2018-02-25 | Discharge: 2018-02-25 | Disposition: A | Discharge status: Home / Self Care | Payer: Medicare Other | Source: Skilled Nursing Facility | Attending: Internal Medicine | Admitting: Internal Medicine

## 2018-02-25 ENCOUNTER — Encounter: Payer: Self-pay | Admitting: Adult Health

## 2018-02-25 ENCOUNTER — Non-Acute Institutional Stay (SKILLED_NURSING_FACILITY): Payer: Medicare Other | Admitting: Adult Health

## 2018-02-25 DIAGNOSIS — I1310 Hypertensive heart and chronic kidney disease without heart failure, with stage 1 through stage 4 chronic kidney disease, or unspecified chronic kidney disease: Secondary | ICD-10-CM | POA: Insufficient documentation

## 2018-02-25 DIAGNOSIS — D649 Anemia, unspecified: Secondary | ICD-10-CM | POA: Diagnosis not present

## 2018-02-25 DIAGNOSIS — S72401D Unspecified fracture of lower end of right femur, subsequent encounter for closed fracture with routine healing: Secondary | ICD-10-CM | POA: Diagnosis not present

## 2018-02-25 DIAGNOSIS — E785 Hyperlipidemia, unspecified: Secondary | ICD-10-CM | POA: Diagnosis not present

## 2018-02-25 DIAGNOSIS — M5431 Sciatica, right side: Secondary | ICD-10-CM

## 2018-02-25 DIAGNOSIS — Z741 Need for assistance with personal care: Secondary | ICD-10-CM | POA: Insufficient documentation

## 2018-02-25 DIAGNOSIS — R600 Localized edema: Secondary | ICD-10-CM | POA: Diagnosis not present

## 2018-02-25 DIAGNOSIS — G40909 Epilepsy, unspecified, not intractable, without status epilepticus: Secondary | ICD-10-CM

## 2018-02-25 DIAGNOSIS — F418 Other specified anxiety disorders: Secondary | ICD-10-CM

## 2018-02-25 DIAGNOSIS — K219 Gastro-esophageal reflux disease without esophagitis: Secondary | ICD-10-CM | POA: Diagnosis not present

## 2018-02-25 DIAGNOSIS — Z96653 Presence of artificial knee joint, bilateral: Secondary | ICD-10-CM | POA: Insufficient documentation

## 2018-02-25 DIAGNOSIS — I639 Cerebral infarction, unspecified: Secondary | ICD-10-CM | POA: Diagnosis not present

## 2018-02-25 DIAGNOSIS — R2990 Unspecified symptoms and signs involving the nervous system: Secondary | ICD-10-CM

## 2018-02-25 DIAGNOSIS — M5432 Sciatica, left side: Secondary | ICD-10-CM

## 2018-02-25 DIAGNOSIS — J42 Unspecified chronic bronchitis: Secondary | ICD-10-CM | POA: Diagnosis not present

## 2018-02-25 LAB — CBC WITH DIFFERENTIAL/PLATELET
Abs Immature Granulocytes: 0.02 10*3/uL (ref 0.00–0.07)
Basophils Absolute: 0 10*3/uL (ref 0.0–0.1)
Basophils Relative: 1 %
Eosinophils Absolute: 0.6 10*3/uL — ABNORMAL HIGH (ref 0.0–0.5)
Eosinophils Relative: 9 %
HCT: 30.1 % — ABNORMAL LOW (ref 36.0–46.0)
Hemoglobin: 8.6 g/dL — ABNORMAL LOW (ref 12.0–15.0)
Immature Granulocytes: 0 %
Lymphocytes Relative: 27 %
Lymphs Abs: 1.6 10*3/uL (ref 0.7–4.0)
MCH: 26.9 pg (ref 26.0–34.0)
MCHC: 28.6 g/dL — ABNORMAL LOW (ref 30.0–36.0)
MCV: 94.1 fL (ref 80.0–100.0)
MONO ABS: 0.4 10*3/uL (ref 0.1–1.0)
MONOS PCT: 7 %
Neutro Abs: 3.5 10*3/uL (ref 1.7–7.7)
Neutrophils Relative %: 56 %
Platelets: 345 10*3/uL (ref 150–400)
RBC: 3.2 MIL/uL — ABNORMAL LOW (ref 3.87–5.11)
RDW: 14.3 % (ref 11.5–15.5)
WBC: 6.2 10*3/uL (ref 4.0–10.5)
nRBC: 0 % (ref 0.0–0.2)

## 2018-02-25 LAB — COMPREHENSIVE METABOLIC PANEL
ALT: 9 U/L (ref 0–44)
AST: 11 U/L — ABNORMAL LOW (ref 15–41)
Albumin: 3 g/dL — ABNORMAL LOW (ref 3.5–5.0)
Alkaline Phosphatase: 49 U/L (ref 38–126)
Anion gap: 7 (ref 5–15)
BUN: 23 mg/dL (ref 8–23)
CO2: 30 mmol/L (ref 22–32)
Calcium: 8.5 mg/dL — ABNORMAL LOW (ref 8.9–10.3)
Chloride: 104 mmol/L (ref 98–111)
Creatinine, Ser: 1.13 mg/dL — ABNORMAL HIGH (ref 0.44–1.00)
GFR calc non Af Amer: 43 mL/min — ABNORMAL LOW (ref >60)
GFR, EST AFRICAN AMERICAN: 50 mL/min — AB (ref >60)
Glucose, Bld: 128 mg/dL — ABNORMAL HIGH (ref 70–99)
Potassium: 3.8 mmol/L (ref 3.5–5.1)
Sodium: 141 mmol/L (ref 135–145)
Total Bilirubin: 0.1 mg/dL — ABNORMAL LOW (ref 0.3–1.2)
Total Protein: 6 g/dL — ABNORMAL LOW (ref 6.5–8.1)

## 2018-02-25 NOTE — Progress Notes (Signed)
Location:   [facility] Room Number: 102 P Place of Service:  SNF (31)   CODE STATUS: Full Code  No Known Allergies  Chief Complaint  Patient presents with  . Medical Management of Chronic Issues    Chronic bronchitis unspecified chronic bronchitis type; seizure disorder; bilateral sciatica. Weekly follow up for the first 30 days post hospitalization      HPI:  She is a 85 year old short term rehab patient being seen for the management of her chronic illnesses: copd; seizure; sciatica. She denies any worsening cough or shortness of breath; no uncontrolled pain present. Denies any changes in her appetite; or anxiety.    Past Medical History:  Diagnosis Date  . Asthma   . Breast cancer ([place]) 02/20/83   LT mastectomy  . Bronchitis   . Dementia ([place])   . Diabetes mellitus ([place])   . Diverticulitis 2009  . Glaucoma   . HTN (hypertension)   . OA (osteoarthritis)   . Obesity 08/17/2014  . Parkinson disease ([place])   . Stroke ([place])   . Thyroid nodule    Dr Legrand Rams    Past Surgical History:  Procedure Laterality Date  .  bilateral catracts    . ABDOMINAL HYSTERECTOMY     complete  . APPENDECTOMY    . EUS  09/10/2011   Procedure: UPPER ENDOSCOPIC ULTRASOUND (EUS) RADIAL;  Surgeon: Arta Silence, MD;  [facility];  Service: Endoscopy;  Laterality: N/A;  christina/ebp  . FINE NEEDLE ASPIRATION  09/10/2011   Procedure: FINE NEEDLE ASPIRATION (FNA) LINEAR;  Surgeon: Arta Silence, MD;  [facility];  Service: Endoscopy;  Laterality: N/A;  . FRACTURE SURGERY  2008    right and left femurs  . JOINT REPLACEMENT  1998/1999   knee boths  . KNEE SURGERY Right    TKA  . KNEE SURGERY Left    TKA  . KNEE SURGERY Right    Periprosthetic fracture /otif  . MASTECTOMY  02/20/1983   left   . PARS PLANA VITRECTOMY Right 06/06/2016   Procedure: VITRECTOMY WITH CULTURES AND INJECTION OF ANTIBIOTICS; ANTERIOR CHAMBER WASHOUT;  Surgeon: Jalene Mullet, MD;  Location: [facility];  Service: Ophthalmology;  Laterality: Right;    Social History   Socioeconomic History  . Marital status: Widowed    Spouse name: Not on file  . Number of children: 6  . Years of education: 8  . Highest education level: Not on file  Occupational History  . 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 Financial resource strain: Not on file  . Food insecurity:    Worry: Not on file    Inability: Not on file  . Transportation needs:    Medical: Not on file    Non-medical: Not on file  Tobacco Use  . Smoking status: Never Smoker  . Smokeless tobacco: Never Used  Substance and Sexual Activity  . Alcohol use: No  . Drug use: No  . Sexual activity: Never    Birth control/protection: Surgical  Lifestyle  . Physical activity:    Days per week: Not on file    Minutes per session: Not on file  . Stress: Not on file  Relationships  . Social connections:    Talks on phone: Not on file    Gets together: Not on file    Attends religious service: Not on file    Active member of club or organization: Not on file  Attends meetings of clubs or organizations: Not on file    Relationship status: Not on file  . Intimate partner violence:    Fear of current or ex partner: Not on file    Emotionally abused: Not on file    Physically abused: Not on file    Forced sexual activity: Not on file  Other Topics Concern  . Not on file  Social History Narrative   Lives w/ daughter Carolynn Comment).   Retired   Right handed.   Education high school   Caffeine None            Family History  Problem Relation Age of Onset  . Huntington's disease Mother   . Stroke Father   . Colon polyps Daughter 82  . Throat cancer Son       VITAL SIGNS BP 133/72   Pulse 76   Temp (!) 97.3 F (36.3 C)   Resp 19   Ht 5\' 4"  (1.626 m)   Wt 229 lb 3.2 oz (104 kg)   SpO2 98%   BMI 39.34 kg/m   Outpatient Encounter Medications as of 02/25/2018  Medication Sig  .  albuterol (PROVENTIL HFA;VENTOLIN HFA) 108 (90 Base) MCG/ACT inhaler Inhale 2 puffs into the lungs every 6 (six) hours as needed for wheezing or shortness of breath.   . ALPRAZolam (XANAX) 0.25 MG tablet Take 1 tablet (0.25 mg total) by mouth 3 (three) times daily as needed for anxiety or sleep.  Marland Kitchen amLODipine (NORVASC) 5 MG tablet Take 5 mg by mouth daily.  . benzonatate (TESSALON) 100 MG capsule Take 1 capsule (100 mg total) by mouth 3 (three) times daily.  . Calcium Carbonate-Vitamin D (CALCIUM + D) 600-200 MG-UNIT TABS Take 1 tablet by mouth every evening.   . clopidogrel (PLAVIX) 75 MG tablet Take 1 tablet (75 mg total) by mouth daily with breakfast.  . docusate sodium (COLACE) 100 MG capsule Take 100 mg by mouth 2 (two) times daily as needed for mild constipation.  Marland Kitchen donepezil (ARICEPT) 10 MG tablet Take 10 mg by mouth at bedtime.  . ferrous gluconate (FERGON) 324 MG tablet Take 324 mg by mouth daily with breakfast.  . FLUoxetine (PROZAC) 20 MG capsule Take 20 mg by mouth daily.  . folic acid (FOLVITE) 1 MG tablet Take 1 mg by mouth daily.  . furosemide (LASIX) 40 MG tablet Take 1 tablet (40 mg total) by mouth 3 (three) times a week.  . gabapentin (NEURONTIN) 300 MG capsule Take 1 capsule (300 mg total) by mouth 3 (three) times daily.  Marland Kitchen guaiFENesin (ROBITUSSIN) 100 MG/5ML SOLN Take 5 mLs (100 mg total) by mouth every 4 (four) hours as needed for cough or to loosen phlegm.  Marland Kitchen ipratropium-albuterol (DUONEB) 0.5-2.5 (3) MG/3ML SOLN Take 3 mLs by nebulization 2 (two) times daily. And every 6 hours as needed  . latanoprost (XALATAN) 0.005 % ophthalmic solution Place 1 drop into both eyes at bedtime.  . levETIRAcetam (KEPPRA) 250 MG tablet Take 250 mg by mouth 2 (two) times daily.  Marland Kitchen lovastatin (MEVACOR) 10 MG tablet Take 10 mg by mouth at bedtime.  . methocarbamol (ROBAXIN) 500 MG tablet Take 1 tablet (500 mg total) by mouth every 8 (eight) hours as needed for muscle spasms.  . NON FORMULARY Diet  Type:  NAS  . omeprazole (PRILOSEC) 20 MG capsule Take 20 mg by mouth 2 (two) times daily.   Marland Kitchen oxyCODONE-acetaminophen (PERCOCET/ROXICET) 5-325 MG tablet Take 1 tablet by mouth every  6 (six) hours as needed for severe pain.  . OXYGEN Inhale 2 L/min into the lungs continuous. Titrate as needed to maintain satuation > 90%  . polyethylene glycol (MIRALAX / GLYCOLAX) packet Take 17 g by mouth daily as needed.  . timolol (BETIMOL) 0.5 % ophthalmic solution Place 1 drop into the left eye daily.   No facility-administered encounter medications on file as of 02/25/2018.      SIGNIFICANT DIAGNOSTIC EXAMS  LABS REVIEWED:   02-25-18: wbc 6.2; hgb 8.6; hct 30.1; mcv 94.1; plt 345 glucose 128; bun 23; creat 1.13; k+ 3.8; na++ 141; ca 8.5 liver normal albumin 3.0   Review of Systems  Constitutional: Negative for malaise/fatigue.  Respiratory: Negative for cough and shortness of breath.   Cardiovascular: Negative for chest pain, palpitations and leg swelling.  Gastrointestinal: Negative for abdominal pain, constipation and heartburn.  Musculoskeletal: Negative for back pain, joint pain and myalgias.  Skin: Negative.   Neurological: Negative for dizziness.  Psychiatric/Behavioral: The patient is not nervous/anxious.     Physical Exam Constitutional:      General: She is not in acute distress.    Appearance: She is well-developed. She is obese. She is not diaphoretic.  Neck:     Musculoskeletal: Neck supple.     Thyroid: No thyromegaly.  Cardiovascular:     Rate and Rhythm: Normal rate and regular rhythm.     Pulses: Normal pulses.     Heart sounds: Normal heart sounds.  Pulmonary:     Effort: Pulmonary effort is normal. No respiratory distress.     Breath sounds: Normal breath sounds.  Abdominal:     General: Bowel sounds are normal. There is no distension.     Palpations: Abdomen is soft.     Tenderness: There is no abdominal tenderness.  Musculoskeletal:     Right lower leg: Edema  present.     Left lower leg: Edema present.     Comments: 1+ bilateral lower extremity edema Is able to move all extremities Is status post right femur fracture  Has essential tremor   Lymphadenopathy:     Cervical: No cervical adenopathy.  Skin:    General: Skin is warm and dry.  Neurological:     Mental Status: She is alert. Mental status is at baseline.  Psychiatric:        Mood and Affect: Mood normal.       ASSESSMENT/ PLAN:  TODAY;   1. COPD: is stable is 02 dependent; will continue albuterol inhaler 2 puffs every 6 hours as needed has duoneb every 6 hours as needed tessalon 100 mg three times daily   2.  Seizure disorder: is stable without recent seizure activity: will continue keppra 250 mg twice daily   3. Sciatica neuralgia: is stable will continue neurontin 300 mg three times daily   4. Right femoral distal fracture: is stable will continue robaxin 500 mg every 8 hours as needed percocet 5/325 mg every 6 hours as needed will continue therapy as directed   5. Gerd without esophagitis: is stable will continue prilosec 20 mg twice daily    6. Stroke like episode: is stable will continue plavix 75 mg daily   7. Dyslipidemia: is stable will continue mevacor 20 mg daily   8. Chronic anemia: is stable will continue fergon daily   9. Bilateral lower extremity edema: is stable will continue lasix 40 mg three times weekly   10. Depression with anxiety: is stable will continue prozac 20 mg  daily and has xanax 0.25 mg three times daily as needed  11. Vascular dementia without behavioral disturbance: is stable will continue aricept 10 mg daily     MD is aware of resident's narcotic use and is in agreement with current plan of care. We will attempt to wean resident as apropriate   Ok Edwards NP [facility]  Contact [phone] Monday through Friday 8am- 5pm  After hours call [phone]

## 2018-03-01 ENCOUNTER — Encounter: Payer: Self-pay | Admitting: Adult Health

## 2018-03-01 ENCOUNTER — Non-Acute Institutional Stay (SKILLED_NURSING_FACILITY): Payer: Medicare Other | Admitting: Adult Health

## 2018-03-01 ENCOUNTER — Encounter: Payer: Self-pay | Admitting: Internal Medicine

## 2018-03-01 ENCOUNTER — Other Ambulatory Visit: Payer: Self-pay | Admitting: Adult Health

## 2018-03-01 DIAGNOSIS — K219 Gastro-esophageal reflux disease without esophagitis: Secondary | ICD-10-CM | POA: Insufficient documentation

## 2018-03-01 DIAGNOSIS — J42 Unspecified chronic bronchitis: Secondary | ICD-10-CM

## 2018-03-01 DIAGNOSIS — R600 Localized edema: Secondary | ICD-10-CM | POA: Insufficient documentation

## 2018-03-01 DIAGNOSIS — E785 Hyperlipidemia, unspecified: Secondary | ICD-10-CM | POA: Insufficient documentation

## 2018-03-01 DIAGNOSIS — S72401D Unspecified fracture of lower end of right femur, subsequent encounter for closed fracture with routine healing: Secondary | ICD-10-CM | POA: Diagnosis not present

## 2018-03-01 DIAGNOSIS — G40909 Epilepsy, unspecified, not intractable, without status epilepticus: Secondary | ICD-10-CM | POA: Diagnosis not present

## 2018-03-01 DIAGNOSIS — M5431 Sciatica, right side: Secondary | ICD-10-CM

## 2018-03-01 DIAGNOSIS — F418 Other specified anxiety disorders: Secondary | ICD-10-CM | POA: Insufficient documentation

## 2018-03-01 DIAGNOSIS — D649 Anemia, unspecified: Secondary | ICD-10-CM | POA: Insufficient documentation

## 2018-03-01 MED ORDER — CLOPIDOGREL BISULFATE 75 MG PO TABS: 75.0000 mg | ORAL_TABLET | Freq: Every day | ORAL | 0 refills | Status: DC

## 2018-03-01 MED ORDER — AMLODIPINE BESYLATE 5 MG PO TABS: 5.0000 mg | ORAL_TABLET | Freq: Every day | ORAL | 0 refills | Status: DC

## 2018-03-01 MED ORDER — ALPRAZOLAM 0.25 MG PO TABS: 0.2500 mg | ORAL_TABLET | Freq: Three times a day (TID) | ORAL | 0 refills | Status: DC | PRN

## 2018-03-01 MED ORDER — FUROSEMIDE 40 MG PO TABS: 40.0000 mg | ORAL_TABLET | ORAL | 0 refills | Status: DC

## 2018-03-01 MED ORDER — DONEPEZIL HCL 10 MG PO TABS: 10.0000 mg | ORAL_TABLET | Freq: Every day | ORAL | 0 refills | Status: DC

## 2018-03-01 MED ORDER — FLUOXETINE HCL 20 MG PO CAPS: 20.0000 mg | ORAL_CAPSULE | Freq: Every day | ORAL | 0 refills | Status: DC

## 2018-03-01 MED ORDER — METHOCARBAMOL 500 MG PO TABS: 500.0000 mg | ORAL_TABLET | Freq: Three times a day (TID) | ORAL | 0 refills | Status: DC | PRN

## 2018-03-01 MED ORDER — IPRATROPIUM-ALBUTEROL 0.5-2.5 (3) MG/3ML IN SOLN: 3.0000 mL | Freq: Two times a day (BID) | RESPIRATORY_TRACT | 0 refills | Status: DC

## 2018-03-01 MED ORDER — TIMOLOL HEMIHYDRATE 0.5 % OP SOLN: 1.0000 [drp] | Freq: Every day | OPHTHALMIC | 0 refills | Status: DC

## 2018-03-01 MED ORDER — ALBUTEROL SULFATE HFA 108 (90 BASE) MCG/ACT IN AERS: 2.0000 | INHALATION_SPRAY | Freq: Four times a day (QID) | RESPIRATORY_TRACT | 0 refills | Status: DC | PRN

## 2018-03-01 MED ORDER — LEVETIRACETAM 250 MG PO TABS: 250.0000 mg | ORAL_TABLET | Freq: Two times a day (BID) | ORAL | 0 refills | Status: DC

## 2018-03-01 MED ORDER — FOLIC ACID 1 MG PO TABS: 1.0000 mg | ORAL_TABLET | Freq: Every day | ORAL | 0 refills | Status: DC

## 2018-03-01 MED ORDER — LATANOPROST 0.005 % OP SOLN: 1.0000 [drp] | Freq: Every day | OPHTHALMIC | 0 refills | Status: DC

## 2018-03-01 MED ORDER — LOVASTATIN 10 MG PO TABS: 10.0000 mg | ORAL_TABLET | Freq: Every day | ORAL | 0 refills | Status: DC

## 2018-03-01 MED ORDER — BENZONATATE 100 MG PO CAPS: 100.0000 mg | ORAL_CAPSULE | Freq: Three times a day (TID) | ORAL | 0 refills | Status: DC

## 2018-03-01 MED ORDER — FERROUS GLUCONATE 324 (38 FE) MG PO TABS: 324.0000 mg | ORAL_TABLET | Freq: Every day | ORAL | 0 refills | Status: DC

## 2018-03-01 MED ORDER — GABAPENTIN 300 MG PO CAPS: 300.0000 mg | ORAL_CAPSULE | Freq: Three times a day (TID) | ORAL | 0 refills | Status: DC

## 2018-03-01 NOTE — Progress Notes (Signed)
Location:   [place] Room Number: 102 P Place of Service:  SNF (31)    CODE STATUS: Full Code  No Known Allergies  Chief Complaint  Patient presents with  . Discharge Note    Discharging to home on 03/02/2018    HPI:  She is being discharged to home with family; has sitters. She will need home health for pt/ot/rn/cna. She will need a semi-electric bed with a hoyer lift. She will need her prescriptions written and will need to follow up with her medical provider. She had been hospitalized for a right femur fracture. She was admitted to this facility for short term rehab and has completed the SNF level of therapy.    Past Medical History:  Diagnosis Date  . Asthma   . Breast cancer ([place]) 02/20/83   LT mastectomy  . Bronchitis   . Dementia ([place])   . Diabetes mellitus ([place])   . Diverticulitis 2009  . Glaucoma   . HTN (hypertension)   . OA (osteoarthritis)   . Obesity 08/17/2014  . Parkinson disease ([place])   . Stroke ([place])   . Thyroid nodule    Dr Legrand Rams    Past Surgical History:  Procedure Laterality Date  .  bilateral catracts    . ABDOMINAL HYSTERECTOMY     complete  . APPENDECTOMY    . EUS  09/10/2011   Procedure: UPPER ENDOSCOPIC ULTRASOUND (EUS) RADIAL;  Surgeon: Arta Silence, MD;  Location: [facility];  Service: Endoscopy;  Laterality: N/A;  christina/ebp  . FINE NEEDLE ASPIRATION  09/10/2011   Procedure: FINE NEEDLE ASPIRATION (FNA) LINEAR;  Surgeon: Arta Silence, MD;  Location: [facility];  Service: Endoscopy;  Laterality: N/A;  . FRACTURE SURGERY  2008    right and left femurs  . JOINT REPLACEMENT  1998/1999   knee boths  . KNEE SURGERY Right    TKA  . KNEE SURGERY Left    TKA  . KNEE SURGERY Right    Periprosthetic fracture /otif  . MASTECTOMY  02/20/1983   left   . PARS PLANA VITRECTOMY Right 06/06/2016   Procedure: VITRECTOMY WITH CULTURES AND INJECTION OF ANTIBIOTICS; ANTERIOR CHAMBER WASHOUT;  Surgeon: Jalene Mullet, MD;  Location: [facility];  Service: Ophthalmology;  Laterality: Right;    Social History   Socioeconomic History  . Marital status: Widowed    Spouse name: Not on file  . Number of children: 6  . Years of education: 52  . Highest education level: Not on file  Occupational History  . 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 Financial resource strain: Not on file  . Food insecurity:    Worry: Not on file    Inability: Not on file  . Transportation needs:    Medical: Not on file    Non-medical: Not on file  Tobacco Use  . Smoking status: Never Smoker  . Smokeless tobacco: Never Used  Substance and Sexual Activity  . Alcohol use: No  . Drug use: No  . Sexual activity: Never    Birth control/protection: Surgical  Lifestyle  . Physical activity:    Days per week: Not on file    Minutes per session: Not on file  . Stress: Not on file  Relationships  . Social connections:    Talks on phone: Not on file    Gets together: Not on file    Attends religious service: Not on file    Active member of club  or organization: Not on file    Attends meetings of clubs or organizations: Not on file    Relationship status: Not on file  . Intimate partner violence:    Fear of current or ex partner: Not on file    Emotionally abused: Not on file    Physically abused: Not on file    Forced sexual activity: Not on file  Other Topics Concern  . Not on file  Social History Narrative   Lives w/ daughter Carolynn Comment).   Retired   Right handed.   Education high school   Caffeine None            Family History  Problem Relation Age of Onset  . Huntington's disease Mother   . Stroke Father   . Colon polyps Daughter 65  . Throat cancer Son     VITAL SIGNS BP 138/70   Pulse 68   Temp 97.8 F (36.6 C)   Resp (!) 26   Ht 5\' 4"  (1.626 m)   Wt 230 lb (104.3 kg)   SpO2 98%   BMI 39.48 kg/m   Patient's Medications  New Prescriptions   No medications on file  Previous  Medications   ALBUTEROL (PROVENTIL HFA;VENTOLIN HFA) 108 (90 BASE) MCG/ACT INHALER    Inhale 2 puffs into the lungs every 6 (six) hours as needed for wheezing or shortness of breath.    ALPRAZOLAM (XANAX) 0.25 MG TABLET    Take 1 tablet (0.25 mg total) by mouth 3 (three) times daily as needed for anxiety or sleep.   AMLODIPINE (NORVASC) 5 MG TABLET    Take 5 mg by mouth daily.   BENZONATATE (TESSALON) 100 MG CAPSULE    Take 1 capsule (100 mg total) by mouth 3 (three) times daily.   CALCIUM CARBONATE-VITAMIN D (CALCIUM + D) 600-200 MG-UNIT TABS    Take 1 tablet by mouth every evening.    CLOPIDOGREL (PLAVIX) 75 MG TABLET    Take 1 tablet (75 mg total) by mouth daily with breakfast.   DOCUSATE SODIUM (COLACE) 100 MG CAPSULE    Take 100 mg by mouth 2 (two) times daily as needed for mild constipation.   DONEPEZIL (ARICEPT) 10 MG TABLET    Take 10 mg by mouth at bedtime.   FERROUS GLUCONATE (FERGON) 324 MG TABLET    Take 324 mg by mouth daily with breakfast.   FLUOXETINE (PROZAC) 20 MG CAPSULE    Take 20 mg by mouth daily.   FOLIC ACID (FOLVITE) 1 MG TABLET    Take 1 mg by mouth daily.   FUROSEMIDE (LASIX) 40 MG TABLET    Take 1 tablet (40 mg total) by mouth 3 (three) times a week.   GABAPENTIN (NEURONTIN) 300 MG CAPSULE    Take 1 capsule (300 mg total) by mouth 3 (three) times daily.   GUAIFENESIN (ROBITUSSIN) 100 MG/5ML SOLN    Take 5 mLs (100 mg total) by mouth every 4 (four) hours as needed for cough or to loosen phlegm.   IPRATROPIUM-ALBUTEROL (DUONEB) 0.5-2.5 (3) MG/3ML SOLN    Take 3 mLs by nebulization 2 (two) times daily. And every 6 hours as needed   LATANOPROST (XALATAN) 0.005 % OPHTHALMIC SOLUTION    Place 1 drop into both eyes at bedtime.   LEVETIRACETAM (KEPPRA) 250 MG TABLET    Take 250 mg by mouth 2 (two) times daily.   LOVASTATIN (MEVACOR) 10 MG TABLET    Take 10 mg by mouth at bedtime.  METHOCARBAMOL (ROBAXIN) 500 MG TABLET    Take 1 tablet (500 mg total) by mouth every 8 (eight)  hours as needed for muscle spasms.   NON FORMULARY    Diet Type:  NAS   OMEPRAZOLE (PRILOSEC) 20 MG CAPSULE    Take 20 mg by mouth 2 (two) times daily.    OXYGEN    Inhale 2 L/min into the lungs continuous. Titrate as needed to maintain satuation > 90%   POLYETHYLENE GLYCOL (MIRALAX / GLYCOLAX) PACKET    Take 17 g by mouth daily as needed.   TIMOLOL (BETIMOL) 0.5 % OPHTHALMIC SOLUTION    Place 1 drop into the left eye daily.  Modified Medications   No medications on file  Discontinued Medications   No medications on file     SIGNIFICANT DIAGNOSTIC EXAMS   LABS REVIEWED:PREVIOUS   02-25-18: wbc 6.2; hgb 8.6; hct 30.1; mcv 94.1; plt 345 glucose 128; bun 23; creat 1.13; k+ 3.8; na++ 141; ca 8.5 liver normal albumin 3.0   NO NEW LABS.    Review of Systems  Constitutional: Negative for malaise/fatigue.  Respiratory: Negative for cough and shortness of breath.   Cardiovascular: Negative for chest pain, palpitations and leg swelling.  Gastrointestinal: Negative for abdominal pain, constipation and heartburn.  Musculoskeletal: Negative for back pain, joint pain and myalgias.  Skin: Negative.   Neurological: Negative for dizziness.  Psychiatric/Behavioral: The patient is not nervous/anxious.    Physical Exam Constitutional:      General: She is not in acute distress.    Appearance: She is well-developed. She is not diaphoretic.     Comments: Obese   Neck:     Musculoskeletal: Neck supple.     Thyroid: No thyromegaly.  Cardiovascular:     Rate and Rhythm: Normal rate and regular rhythm.     Pulses: Normal pulses.     Heart sounds: Normal heart sounds.  Pulmonary:     Effort: Pulmonary effort is normal. No respiratory distress.     Breath sounds: Normal breath sounds.  Abdominal:     General: Bowel sounds are normal. There is no distension.     Palpations: Abdomen is soft.     Tenderness: There is no abdominal tenderness.  Musculoskeletal:     Right lower leg: Edema present.       Left lower leg: Edema present.     Comments: 1+ bilateral lower extremity edema Is able to move all extremities Is status post right femur fracture  Has essential tremor   Lymphadenopathy:     Cervical: No cervical adenopathy.  Skin:    General: Skin is warm and dry.  Neurological:     Mental Status: She is alert. Mental status is at baseline.  Psychiatric:        Mood and Affect: Mood normal.      ASSESSMENT/ PLAN:  Patient is being discharged with the following home health services:  Pt/ot/rn/cna to evaluate and treat as indicated for gait balance strength adl training medication management and adl care.   Patient is being discharged with the following durable medical equipment:  Semi-electric bed with hoyer lift: due to COPD: requires HOB elevated at least 30 degrees due to shortness of breath which cannot be achieved with a standard bed and wedges.   Patient has been advised to f/u with their PCP in 1-2 weeks to bring them up to date on their rehab stay.  Social services at facility was responsible for arranging this appointment.  Pt  was provided with a 30 day supply of prescriptions for medications and refills must be obtained from their PCP.  For controlled substances, a more limited supply may be provided adequate until PCP appointment only.   [facility] day [facility] [place] [facility] xanax 0.25 mg tabs.   Time spent with patient: 40 minutes: for medications; dme; home health needs.    Ok Edwards NP [facility]  Contact [phone] Monday through Friday 8am- 5pm  After hours call [phone]

## 2018-03-01 NOTE — Progress Notes (Signed)
A user error has taken place.

## 2018-03-02 DIAGNOSIS — M79604 Pain in right leg: Secondary | ICD-10-CM | POA: Diagnosis not present

## 2018-03-02 DIAGNOSIS — M9711XA Periprosthetic fracture around internal prosthetic right knee joint, initial encounter: Secondary | ICD-10-CM | POA: Diagnosis not present

## 2018-03-03 DIAGNOSIS — W19XXXD Unspecified fall, subsequent encounter: Secondary | ICD-10-CM | POA: Diagnosis not present

## 2018-03-03 DIAGNOSIS — Z9181 History of falling: Secondary | ICD-10-CM | POA: Diagnosis not present

## 2018-03-03 DIAGNOSIS — M5136 Other intervertebral disc degeneration, lumbar region: Secondary | ICD-10-CM | POA: Diagnosis not present

## 2018-03-03 DIAGNOSIS — Z7902 Long term (current) use of antithrombotics/antiplatelets: Secondary | ICD-10-CM | POA: Diagnosis not present

## 2018-03-03 DIAGNOSIS — Z6839 Body mass index (BMI) 39.0-39.9, adult: Secondary | ICD-10-CM | POA: Diagnosis not present

## 2018-03-03 DIAGNOSIS — S72331D Displaced oblique fracture of shaft of right femur, subsequent encounter for closed fracture with routine healing: Secondary | ICD-10-CM | POA: Diagnosis not present

## 2018-03-03 DIAGNOSIS — G40909 Epilepsy, unspecified, not intractable, without status epilepticus: Secondary | ICD-10-CM | POA: Diagnosis not present

## 2018-03-03 DIAGNOSIS — Z8673 Personal history of transient ischemic attack (TIA), and cerebral infarction without residual deficits: Secondary | ICD-10-CM | POA: Diagnosis not present

## 2018-03-03 DIAGNOSIS — J45901 Unspecified asthma with (acute) exacerbation: Secondary | ICD-10-CM | POA: Diagnosis not present

## 2018-03-03 DIAGNOSIS — I129 Hypertensive chronic kidney disease with stage 1 through stage 4 chronic kidney disease, or unspecified chronic kidney disease: Secondary | ICD-10-CM | POA: Diagnosis not present

## 2018-03-03 DIAGNOSIS — M9711XD Periprosthetic fracture around internal prosthetic right knee joint, subsequent encounter: Secondary | ICD-10-CM | POA: Diagnosis not present

## 2018-03-03 DIAGNOSIS — E669 Obesity, unspecified: Secondary | ICD-10-CM | POA: Diagnosis not present

## 2018-03-03 DIAGNOSIS — E1122 Type 2 diabetes mellitus with diabetic chronic kidney disease: Secondary | ICD-10-CM | POA: Diagnosis not present

## 2018-03-03 DIAGNOSIS — Z96652 Presence of left artificial knee joint: Secondary | ICD-10-CM | POA: Diagnosis not present

## 2018-03-03 DIAGNOSIS — F0280 Dementia in other diseases classified elsewhere without behavioral disturbance: Secondary | ICD-10-CM | POA: Diagnosis not present

## 2018-03-03 DIAGNOSIS — G20 Parkinson's disease: Secondary | ICD-10-CM | POA: Diagnosis not present

## 2018-03-03 DIAGNOSIS — N183 Chronic kidney disease, stage 3 (moderate): Secondary | ICD-10-CM | POA: Diagnosis not present

## 2018-03-03 DIAGNOSIS — Z79899 Other long term (current) drug therapy: Secondary | ICD-10-CM | POA: Diagnosis not present

## 2018-03-04 DIAGNOSIS — S72331D Displaced oblique fracture of shaft of right femur, subsequent encounter for closed fracture with routine healing: Secondary | ICD-10-CM | POA: Diagnosis not present

## 2018-03-04 DIAGNOSIS — M9711XD Periprosthetic fracture around internal prosthetic right knee joint, subsequent encounter: Secondary | ICD-10-CM | POA: Diagnosis not present

## 2018-03-04 DIAGNOSIS — F0280 Dementia in other diseases classified elsewhere without behavioral disturbance: Secondary | ICD-10-CM | POA: Diagnosis not present

## 2018-03-04 DIAGNOSIS — J45901 Unspecified asthma with (acute) exacerbation: Secondary | ICD-10-CM | POA: Diagnosis not present

## 2018-03-04 DIAGNOSIS — G40909 Epilepsy, unspecified, not intractable, without status epilepticus: Secondary | ICD-10-CM | POA: Diagnosis not present

## 2018-03-04 DIAGNOSIS — G20 Parkinson's disease: Secondary | ICD-10-CM | POA: Diagnosis not present

## 2018-03-05 DIAGNOSIS — G20 Parkinson's disease: Secondary | ICD-10-CM | POA: Diagnosis not present

## 2018-03-05 DIAGNOSIS — M9711XD Periprosthetic fracture around internal prosthetic right knee joint, subsequent encounter: Secondary | ICD-10-CM | POA: Diagnosis not present

## 2018-03-05 DIAGNOSIS — S72331D Displaced oblique fracture of shaft of right femur, subsequent encounter for closed fracture with routine healing: Secondary | ICD-10-CM | POA: Diagnosis not present

## 2018-03-05 DIAGNOSIS — J45901 Unspecified asthma with (acute) exacerbation: Secondary | ICD-10-CM | POA: Diagnosis not present

## 2018-03-05 DIAGNOSIS — G40909 Epilepsy, unspecified, not intractable, without status epilepticus: Secondary | ICD-10-CM | POA: Diagnosis not present

## 2018-03-05 DIAGNOSIS — F0280 Dementia in other diseases classified elsewhere without behavioral disturbance: Secondary | ICD-10-CM | POA: Diagnosis not present

## 2018-03-08 ENCOUNTER — Other Ambulatory Visit: Payer: Self-pay

## 2018-03-08 ENCOUNTER — Telehealth: Payer: Self-pay | Admitting: Orthopedic Surgery

## 2018-03-08 DIAGNOSIS — M9711XD Periprosthetic fracture around internal prosthetic right knee joint, subsequent encounter: Secondary | ICD-10-CM | POA: Diagnosis not present

## 2018-03-08 DIAGNOSIS — G20 Parkinson's disease: Secondary | ICD-10-CM | POA: Diagnosis not present

## 2018-03-08 DIAGNOSIS — J45901 Unspecified asthma with (acute) exacerbation: Secondary | ICD-10-CM | POA: Diagnosis not present

## 2018-03-08 DIAGNOSIS — F0280 Dementia in other diseases classified elsewhere without behavioral disturbance: Secondary | ICD-10-CM | POA: Diagnosis not present

## 2018-03-08 DIAGNOSIS — S72331D Displaced oblique fracture of shaft of right femur, subsequent encounter for closed fracture with routine healing: Secondary | ICD-10-CM | POA: Diagnosis not present

## 2018-03-08 DIAGNOSIS — G40909 Epilepsy, unspecified, not intractable, without status epilepticus: Secondary | ICD-10-CM | POA: Diagnosis not present

## 2018-03-08 NOTE — Telephone Encounter (Signed)
Received call regarding an appointment for Tiffany Hartman. She was in [place] for a hip fracture and has been seen by Dr. Anne Fu office. She stated that she was told she would be transferred to our office. I couldn't find anything stating that so I told her that she would need to contact their office to see what she would need to do. I told her that until we have something in writing we could not see her for her hip fracture, stated she understood.

## 2018-03-08 NOTE — Patient Outreach (Signed)
[facility]) Care Management  03/08/2018  ANALIESE KRUPKA Jun 19, 1927 [id number]   EMMI- General Discharge RED ON EMMI ALERT Day # 4 Date: 03/07/2018 Red Alert Reason:  Lost interest in things? Yes  Outreach attempt: spoke with daughter Gaspar Skeeters who is able to verify HIPAA.  She states that patient is doing well and working on follow up appointments with physician.  Discussed red alert. She reports that she has been answering those calls and patient has not had any problems with depression.  She declines any further needs at this time.     Plan: RN CM will close case.    Jone Baseman, RN, MSN [facility] Care Management Care Management Coordinator Direct Line [phone] Toll Free: [phone]  Fax: [phone]

## 2018-03-09 DIAGNOSIS — J45901 Unspecified asthma with (acute) exacerbation: Secondary | ICD-10-CM | POA: Diagnosis not present

## 2018-03-09 DIAGNOSIS — F0280 Dementia in other diseases classified elsewhere without behavioral disturbance: Secondary | ICD-10-CM | POA: Diagnosis not present

## 2018-03-09 DIAGNOSIS — G20 Parkinson's disease: Secondary | ICD-10-CM | POA: Diagnosis not present

## 2018-03-09 DIAGNOSIS — G40909 Epilepsy, unspecified, not intractable, without status epilepticus: Secondary | ICD-10-CM | POA: Diagnosis not present

## 2018-03-09 DIAGNOSIS — S72331D Displaced oblique fracture of shaft of right femur, subsequent encounter for closed fracture with routine healing: Secondary | ICD-10-CM | POA: Diagnosis not present

## 2018-03-09 DIAGNOSIS — M9711XD Periprosthetic fracture around internal prosthetic right knee joint, subsequent encounter: Secondary | ICD-10-CM | POA: Diagnosis not present

## 2018-03-10 DIAGNOSIS — G20 Parkinson's disease: Secondary | ICD-10-CM | POA: Diagnosis not present

## 2018-03-10 DIAGNOSIS — F0280 Dementia in other diseases classified elsewhere without behavioral disturbance: Secondary | ICD-10-CM | POA: Diagnosis not present

## 2018-03-10 DIAGNOSIS — G40909 Epilepsy, unspecified, not intractable, without status epilepticus: Secondary | ICD-10-CM | POA: Diagnosis not present

## 2018-03-10 DIAGNOSIS — J45901 Unspecified asthma with (acute) exacerbation: Secondary | ICD-10-CM | POA: Diagnosis not present

## 2018-03-10 DIAGNOSIS — M9711XD Periprosthetic fracture around internal prosthetic right knee joint, subsequent encounter: Secondary | ICD-10-CM | POA: Diagnosis not present

## 2018-03-10 DIAGNOSIS — S72331D Displaced oblique fracture of shaft of right femur, subsequent encounter for closed fracture with routine healing: Secondary | ICD-10-CM | POA: Diagnosis not present

## 2018-03-11 DIAGNOSIS — M9711XD Periprosthetic fracture around internal prosthetic right knee joint, subsequent encounter: Secondary | ICD-10-CM | POA: Diagnosis not present

## 2018-03-11 DIAGNOSIS — S72331D Displaced oblique fracture of shaft of right femur, subsequent encounter for closed fracture with routine healing: Secondary | ICD-10-CM | POA: Diagnosis not present

## 2018-03-11 DIAGNOSIS — G20 Parkinson's disease: Secondary | ICD-10-CM | POA: Diagnosis not present

## 2018-03-11 DIAGNOSIS — J45901 Unspecified asthma with (acute) exacerbation: Secondary | ICD-10-CM | POA: Diagnosis not present

## 2018-03-11 DIAGNOSIS — F0280 Dementia in other diseases classified elsewhere without behavioral disturbance: Secondary | ICD-10-CM | POA: Diagnosis not present

## 2018-03-11 DIAGNOSIS — G40909 Epilepsy, unspecified, not intractable, without status epilepticus: Secondary | ICD-10-CM | POA: Diagnosis not present

## 2018-03-12 DIAGNOSIS — M9711XD Periprosthetic fracture around internal prosthetic right knee joint, subsequent encounter: Secondary | ICD-10-CM | POA: Diagnosis not present

## 2018-03-12 DIAGNOSIS — G40909 Epilepsy, unspecified, not intractable, without status epilepticus: Secondary | ICD-10-CM | POA: Diagnosis not present

## 2018-03-12 DIAGNOSIS — S72331D Displaced oblique fracture of shaft of right femur, subsequent encounter for closed fracture with routine healing: Secondary | ICD-10-CM | POA: Diagnosis not present

## 2018-03-12 DIAGNOSIS — G20 Parkinson's disease: Secondary | ICD-10-CM | POA: Diagnosis not present

## 2018-03-12 DIAGNOSIS — F0280 Dementia in other diseases classified elsewhere without behavioral disturbance: Secondary | ICD-10-CM | POA: Diagnosis not present

## 2018-03-12 DIAGNOSIS — J45901 Unspecified asthma with (acute) exacerbation: Secondary | ICD-10-CM | POA: Diagnosis not present

## 2018-03-14 DIAGNOSIS — M9711XD Periprosthetic fracture around internal prosthetic right knee joint, subsequent encounter: Secondary | ICD-10-CM | POA: Diagnosis not present

## 2018-03-14 DIAGNOSIS — S72331D Displaced oblique fracture of shaft of right femur, subsequent encounter for closed fracture with routine healing: Secondary | ICD-10-CM | POA: Diagnosis not present

## 2018-03-14 DIAGNOSIS — F0280 Dementia in other diseases classified elsewhere without behavioral disturbance: Secondary | ICD-10-CM | POA: Diagnosis not present

## 2018-03-14 DIAGNOSIS — G20 Parkinson's disease: Secondary | ICD-10-CM | POA: Diagnosis not present

## 2018-03-14 DIAGNOSIS — G40909 Epilepsy, unspecified, not intractable, without status epilepticus: Secondary | ICD-10-CM | POA: Diagnosis not present

## 2018-03-14 DIAGNOSIS — J45901 Unspecified asthma with (acute) exacerbation: Secondary | ICD-10-CM | POA: Diagnosis not present

## 2018-03-15 DIAGNOSIS — J45901 Unspecified asthma with (acute) exacerbation: Secondary | ICD-10-CM | POA: Diagnosis not present

## 2018-03-15 DIAGNOSIS — F0280 Dementia in other diseases classified elsewhere without behavioral disturbance: Secondary | ICD-10-CM | POA: Diagnosis not present

## 2018-03-15 DIAGNOSIS — G40909 Epilepsy, unspecified, not intractable, without status epilepticus: Secondary | ICD-10-CM | POA: Diagnosis not present

## 2018-03-15 DIAGNOSIS — M9711XD Periprosthetic fracture around internal prosthetic right knee joint, subsequent encounter: Secondary | ICD-10-CM | POA: Diagnosis not present

## 2018-03-15 DIAGNOSIS — G20 Parkinson's disease: Secondary | ICD-10-CM | POA: Diagnosis not present

## 2018-03-15 DIAGNOSIS — S72331D Displaced oblique fracture of shaft of right femur, subsequent encounter for closed fracture with routine healing: Secondary | ICD-10-CM | POA: Diagnosis not present

## 2018-03-16 DIAGNOSIS — F0280 Dementia in other diseases classified elsewhere without behavioral disturbance: Secondary | ICD-10-CM | POA: Diagnosis not present

## 2018-03-16 DIAGNOSIS — G20 Parkinson's disease: Secondary | ICD-10-CM | POA: Diagnosis not present

## 2018-03-16 DIAGNOSIS — G40909 Epilepsy, unspecified, not intractable, without status epilepticus: Secondary | ICD-10-CM | POA: Diagnosis not present

## 2018-03-16 DIAGNOSIS — M9711XD Periprosthetic fracture around internal prosthetic right knee joint, subsequent encounter: Secondary | ICD-10-CM | POA: Diagnosis not present

## 2018-03-16 DIAGNOSIS — J45901 Unspecified asthma with (acute) exacerbation: Secondary | ICD-10-CM | POA: Diagnosis not present

## 2018-03-16 DIAGNOSIS — S72331D Displaced oblique fracture of shaft of right femur, subsequent encounter for closed fracture with routine healing: Secondary | ICD-10-CM | POA: Diagnosis not present

## 2018-03-17 ENCOUNTER — Telehealth: Payer: Self-pay | Admitting: Orthopedic Surgery

## 2018-03-17 DIAGNOSIS — G20 Parkinson's disease: Secondary | ICD-10-CM | POA: Diagnosis not present

## 2018-03-17 DIAGNOSIS — M9711XD Periprosthetic fracture around internal prosthetic right knee joint, subsequent encounter: Secondary | ICD-10-CM | POA: Diagnosis not present

## 2018-03-17 DIAGNOSIS — S72331D Displaced oblique fracture of shaft of right femur, subsequent encounter for closed fracture with routine healing: Secondary | ICD-10-CM | POA: Diagnosis not present

## 2018-03-17 DIAGNOSIS — G40909 Epilepsy, unspecified, not intractable, without status epilepticus: Secondary | ICD-10-CM | POA: Diagnosis not present

## 2018-03-17 DIAGNOSIS — F0280 Dementia in other diseases classified elsewhere without behavioral disturbance: Secondary | ICD-10-CM | POA: Diagnosis not present

## 2018-03-17 DIAGNOSIS — J45901 Unspecified asthma with (acute) exacerbation: Secondary | ICD-10-CM | POA: Diagnosis not present

## 2018-03-17 NOTE — Telephone Encounter (Signed)
I took a call today from Cedric Fishman from [facility].   She was with Ms. Kubin.  She said Tiffany Hartman had a fx hip back on 03/05/18.  Dr. Maureen Ralphs has been treating her for this and Dr. Aline Brochure has been treating her for her shoulder.  Ms. Hemm wants Dr. Aline Brochure to see her for both problems.  She says she spoke to Dr. Maureen Ralphs regarding this and he said it was fine with him.  Can she be seen here for both problems?

## 2018-03-19 DIAGNOSIS — M9711XD Periprosthetic fracture around internal prosthetic right knee joint, subsequent encounter: Secondary | ICD-10-CM | POA: Diagnosis not present

## 2018-03-19 DIAGNOSIS — G20 Parkinson's disease: Secondary | ICD-10-CM | POA: Diagnosis not present

## 2018-03-19 DIAGNOSIS — S72331D Displaced oblique fracture of shaft of right femur, subsequent encounter for closed fracture with routine healing: Secondary | ICD-10-CM | POA: Diagnosis not present

## 2018-03-19 DIAGNOSIS — F0280 Dementia in other diseases classified elsewhere without behavioral disturbance: Secondary | ICD-10-CM | POA: Diagnosis not present

## 2018-03-19 DIAGNOSIS — J45901 Unspecified asthma with (acute) exacerbation: Secondary | ICD-10-CM | POA: Diagnosis not present

## 2018-03-19 DIAGNOSIS — G40909 Epilepsy, unspecified, not intractable, without status epilepticus: Secondary | ICD-10-CM | POA: Diagnosis not present

## 2018-03-19 NOTE — Telephone Encounter (Signed)
Need more info: whats wrong with her hip ???

## 2018-03-22 DIAGNOSIS — S72331D Displaced oblique fracture of shaft of right femur, subsequent encounter for closed fracture with routine healing: Secondary | ICD-10-CM | POA: Diagnosis not present

## 2018-03-22 DIAGNOSIS — M9711XD Periprosthetic fracture around internal prosthetic right knee joint, subsequent encounter: Secondary | ICD-10-CM | POA: Diagnosis not present

## 2018-03-22 DIAGNOSIS — F0280 Dementia in other diseases classified elsewhere without behavioral disturbance: Secondary | ICD-10-CM | POA: Diagnosis not present

## 2018-03-22 DIAGNOSIS — G40909 Epilepsy, unspecified, not intractable, without status epilepticus: Secondary | ICD-10-CM | POA: Diagnosis not present

## 2018-03-22 DIAGNOSIS — J45901 Unspecified asthma with (acute) exacerbation: Secondary | ICD-10-CM | POA: Diagnosis not present

## 2018-03-22 DIAGNOSIS — G20 Parkinson's disease: Secondary | ICD-10-CM | POA: Diagnosis not present

## 2018-03-22 NOTE — Telephone Encounter (Signed)
New minimally displaced oblique fracture seen involving the distal right femoral shaft in area that has previously undergone surgical internal fixation. 02/13/17  Dr Wynelle Link note states Assessment/Plan: Right distal femur fracture- Continue PT. May need SNF if not progressing well

## 2018-03-22 NOTE — Telephone Encounter (Signed)
Ok.  I will see

## 2018-03-24 ENCOUNTER — Ambulatory Visit (INDEPENDENT_AMBULATORY_CARE_PROVIDER_SITE_OTHER): Payer: Medicare Other

## 2018-03-24 ENCOUNTER — Other Ambulatory Visit: Payer: Self-pay

## 2018-03-24 ENCOUNTER — Other Ambulatory Visit: Payer: Self-pay | Admitting: Orthopedic Surgery

## 2018-03-24 ENCOUNTER — Ambulatory Visit (INDEPENDENT_AMBULATORY_CARE_PROVIDER_SITE_OTHER): Payer: Medicare Other | Admitting: Orthopedic Surgery

## 2018-03-24 ENCOUNTER — Encounter: Payer: Self-pay | Admitting: Orthopedic Surgery

## 2018-03-24 ENCOUNTER — Telehealth: Payer: Self-pay | Admitting: Orthopedic Surgery

## 2018-03-24 VITALS — BP 136/105 | HR 70 | Ht 61.0 in

## 2018-03-24 DIAGNOSIS — M25511 Pain in right shoulder: Secondary | ICD-10-CM | POA: Diagnosis not present

## 2018-03-24 DIAGNOSIS — M25512 Pain in left shoulder: Secondary | ICD-10-CM | POA: Diagnosis not present

## 2018-03-24 DIAGNOSIS — G8929 Other chronic pain: Secondary | ICD-10-CM | POA: Diagnosis not present

## 2018-03-24 DIAGNOSIS — S72401A Unspecified fracture of lower end of right femur, initial encounter for closed fracture: Secondary | ICD-10-CM | POA: Diagnosis not present

## 2018-03-24 DIAGNOSIS — G20 Parkinson's disease: Secondary | ICD-10-CM | POA: Diagnosis not present

## 2018-03-24 DIAGNOSIS — J45901 Unspecified asthma with (acute) exacerbation: Secondary | ICD-10-CM | POA: Diagnosis not present

## 2018-03-24 DIAGNOSIS — M9711XD Periprosthetic fracture around internal prosthetic right knee joint, subsequent encounter: Secondary | ICD-10-CM | POA: Diagnosis not present

## 2018-03-24 DIAGNOSIS — F0280 Dementia in other diseases classified elsewhere without behavioral disturbance: Secondary | ICD-10-CM | POA: Diagnosis not present

## 2018-03-24 DIAGNOSIS — G40909 Epilepsy, unspecified, not intractable, without status epilepticus: Secondary | ICD-10-CM | POA: Diagnosis not present

## 2018-03-24 DIAGNOSIS — S72331D Displaced oblique fracture of shaft of right femur, subsequent encounter for closed fracture with routine healing: Secondary | ICD-10-CM | POA: Diagnosis not present

## 2018-03-24 MED ORDER — HYDROCODONE-ACETAMINOPHEN 5-325 MG PO TABS: 1.0000 | ORAL_TABLET | Freq: Four times a day (QID) | ORAL | 0 refills | Status: AC | PRN

## 2018-03-24 NOTE — Progress Notes (Signed)
Patient ID: Tiffany Hartman, female   DOB: 05/19/1927, 85 y.o.   MRN: [id number]    Chief Complaint  Patient presents with  . Shoulder Pain    bilateral, when doing physical therapy   . Leg Pain    has femur fracture right/ not painful 02/12/18 date of injury     Encounter Diagnosis  Name Primary?  . Closed fracture of distal end of right femur, unspecified fracture morphology, initial encounter ([place]) Yes    CURRENT TREATMENT : 50% weightbearing POST INJURY DAY: 40  GLOBAL PERIOD DAY 40/90  The patient was seen in [place] for this periprosthetic fracture and was told she could be weightbearing with 50 pounds of weight may be 50% weight on the right leg  X-rayed her knee today in her femur and is a nondisplaced fracture hardware is intact she can bend her knee almost 90 degrees she does have tenderness at the fracture site  Chronic shoulder pain increased with requiring more weight on the arms during this treatment for the fracture  Bilateral shoulder pain  Bilateral injection  Procedure note the subacromial injection shoulder RIGHT  Verbal consent was obtained to inject the  RIGHT   Shoulder  Timeout was completed to confirm the injection site is a subacromial space of the  RIGHT  shoulder   Medication used Depo-Medrol 40 mg and lidocaine 1% 3 cc  Anesthesia was provided by ethyl chloride  The injection was performed in the RIGHT  posterior subacromial space. After pinning the skin with alcohol and anesthetized the skin with ethyl chloride the subacromial space was injected using a 20-gauge needle. There were no complications  Sterile dressing was applied.    Procedure note the subacromial injection shoulder left   Verbal consent was obtained to inject the  Left   Shoulder  Timeout was completed to confirm the injection site is a subacromial space of the  left  shoulder  Medication used Depo-Medrol 40 mg and lidocaine 1% 3 cc  Anesthesia was provided by ethyl  chloride  The injection was performed in the left  posterior subacromial space. After pinning the skin with alcohol and anesthetized the skin with ethyl chloride the subacromial space was injected using a 20-gauge needle. There were no complications  Sterile dressing was applied.

## 2018-03-24 NOTE — Telephone Encounter (Signed)
Patient asked at check-out from today's office visit 03/24/18 for medication for pain if Dr Aline Brochure can prescribe something: [facility] [place]  - states forgot to ask while in exam room.

## 2018-03-26 DIAGNOSIS — J45901 Unspecified asthma with (acute) exacerbation: Secondary | ICD-10-CM | POA: Diagnosis not present

## 2018-03-26 DIAGNOSIS — G40909 Epilepsy, unspecified, not intractable, without status epilepticus: Secondary | ICD-10-CM | POA: Diagnosis not present

## 2018-03-26 DIAGNOSIS — S72331D Displaced oblique fracture of shaft of right femur, subsequent encounter for closed fracture with routine healing: Secondary | ICD-10-CM | POA: Diagnosis not present

## 2018-03-26 DIAGNOSIS — G20 Parkinson's disease: Secondary | ICD-10-CM | POA: Diagnosis not present

## 2018-03-26 DIAGNOSIS — F0280 Dementia in other diseases classified elsewhere without behavioral disturbance: Secondary | ICD-10-CM | POA: Diagnosis not present

## 2018-03-26 DIAGNOSIS — M9711XD Periprosthetic fracture around internal prosthetic right knee joint, subsequent encounter: Secondary | ICD-10-CM | POA: Diagnosis not present

## 2018-03-28 ENCOUNTER — Other Ambulatory Visit: Payer: Self-pay | Admitting: Adult Health

## 2018-03-29 ENCOUNTER — Telehealth: Payer: Self-pay | Admitting: Orthopedic Surgery

## 2018-03-29 DIAGNOSIS — F0280 Dementia in other diseases classified elsewhere without behavioral disturbance: Secondary | ICD-10-CM | POA: Diagnosis not present

## 2018-03-29 DIAGNOSIS — S72331D Displaced oblique fracture of shaft of right femur, subsequent encounter for closed fracture with routine healing: Secondary | ICD-10-CM | POA: Diagnosis not present

## 2018-03-29 DIAGNOSIS — J45901 Unspecified asthma with (acute) exacerbation: Secondary | ICD-10-CM | POA: Diagnosis not present

## 2018-03-29 DIAGNOSIS — M9711XD Periprosthetic fracture around internal prosthetic right knee joint, subsequent encounter: Secondary | ICD-10-CM | POA: Diagnosis not present

## 2018-03-29 DIAGNOSIS — G40909 Epilepsy, unspecified, not intractable, without status epilepticus: Secondary | ICD-10-CM | POA: Diagnosis not present

## 2018-03-29 DIAGNOSIS — G20 Parkinson's disease: Secondary | ICD-10-CM | POA: Diagnosis not present

## 2018-03-29 NOTE — Telephone Encounter (Signed)
Call received from [facility] care, ph#[phone]. States that since Dr Aline Brochure has assumed care of Right lower extremity physical therapy, asking if it is correct (as patient's family relayed) that patient may begin to increase weight bearing?  Also requesting to continue home therapy 2 times a week for the next 2 to 3 weeks?  Please advise.

## 2018-03-29 NOTE — Telephone Encounter (Signed)
Yes, he has increased to 75lbs, and 2 times weekly for 3 weeks is great. I have called her to advise.

## 2018-03-31 DIAGNOSIS — M9711XD Periprosthetic fracture around internal prosthetic right knee joint, subsequent encounter: Secondary | ICD-10-CM | POA: Diagnosis not present

## 2018-03-31 DIAGNOSIS — J45901 Unspecified asthma with (acute) exacerbation: Secondary | ICD-10-CM | POA: Diagnosis not present

## 2018-03-31 DIAGNOSIS — G40909 Epilepsy, unspecified, not intractable, without status epilepticus: Secondary | ICD-10-CM | POA: Diagnosis not present

## 2018-03-31 DIAGNOSIS — G20 Parkinson's disease: Secondary | ICD-10-CM | POA: Diagnosis not present

## 2018-03-31 DIAGNOSIS — S72331D Displaced oblique fracture of shaft of right femur, subsequent encounter for closed fracture with routine healing: Secondary | ICD-10-CM | POA: Diagnosis not present

## 2018-03-31 DIAGNOSIS — F0280 Dementia in other diseases classified elsewhere without behavioral disturbance: Secondary | ICD-10-CM | POA: Diagnosis not present

## 2018-04-01 DIAGNOSIS — N390 Urinary tract infection, site not specified: Secondary | ICD-10-CM | POA: Diagnosis not present

## 2018-04-01 DIAGNOSIS — J45901 Unspecified asthma with (acute) exacerbation: Secondary | ICD-10-CM | POA: Diagnosis not present

## 2018-04-01 DIAGNOSIS — S72331D Displaced oblique fracture of shaft of right femur, subsequent encounter for closed fracture with routine healing: Secondary | ICD-10-CM | POA: Diagnosis not present

## 2018-04-01 DIAGNOSIS — G40909 Epilepsy, unspecified, not intractable, without status epilepticus: Secondary | ICD-10-CM | POA: Diagnosis not present

## 2018-04-01 DIAGNOSIS — G20 Parkinson's disease: Secondary | ICD-10-CM | POA: Diagnosis not present

## 2018-04-01 DIAGNOSIS — M9711XD Periprosthetic fracture around internal prosthetic right knee joint, subsequent encounter: Secondary | ICD-10-CM | POA: Diagnosis not present

## 2018-04-01 DIAGNOSIS — F0280 Dementia in other diseases classified elsewhere without behavioral disturbance: Secondary | ICD-10-CM | POA: Diagnosis not present

## 2018-04-02 DIAGNOSIS — Z96652 Presence of left artificial knee joint: Secondary | ICD-10-CM | POA: Diagnosis not present

## 2018-04-02 DIAGNOSIS — M5136 Other intervertebral disc degeneration, lumbar region: Secondary | ICD-10-CM | POA: Diagnosis not present

## 2018-04-02 DIAGNOSIS — G40909 Epilepsy, unspecified, not intractable, without status epilepticus: Secondary | ICD-10-CM | POA: Diagnosis not present

## 2018-04-02 DIAGNOSIS — F0280 Dementia in other diseases classified elsewhere without behavioral disturbance: Secondary | ICD-10-CM | POA: Diagnosis not present

## 2018-04-02 DIAGNOSIS — Z7902 Long term (current) use of antithrombotics/antiplatelets: Secondary | ICD-10-CM | POA: Diagnosis not present

## 2018-04-02 DIAGNOSIS — G20 Parkinson's disease: Secondary | ICD-10-CM | POA: Diagnosis not present

## 2018-04-02 DIAGNOSIS — N183 Chronic kidney disease, stage 3 (moderate): Secondary | ICD-10-CM | POA: Diagnosis not present

## 2018-04-02 DIAGNOSIS — Z8673 Personal history of transient ischemic attack (TIA), and cerebral infarction without residual deficits: Secondary | ICD-10-CM | POA: Diagnosis not present

## 2018-04-02 DIAGNOSIS — I129 Hypertensive chronic kidney disease with stage 1 through stage 4 chronic kidney disease, or unspecified chronic kidney disease: Secondary | ICD-10-CM | POA: Diagnosis not present

## 2018-04-02 DIAGNOSIS — Z6839 Body mass index (BMI) 39.0-39.9, adult: Secondary | ICD-10-CM | POA: Diagnosis not present

## 2018-04-02 DIAGNOSIS — M9711XD Periprosthetic fracture around internal prosthetic right knee joint, subsequent encounter: Secondary | ICD-10-CM | POA: Diagnosis not present

## 2018-04-02 DIAGNOSIS — E1122 Type 2 diabetes mellitus with diabetic chronic kidney disease: Secondary | ICD-10-CM | POA: Diagnosis not present

## 2018-04-02 DIAGNOSIS — E669 Obesity, unspecified: Secondary | ICD-10-CM | POA: Diagnosis not present

## 2018-04-02 DIAGNOSIS — Z9181 History of falling: Secondary | ICD-10-CM | POA: Diagnosis not present

## 2018-04-02 DIAGNOSIS — J45901 Unspecified asthma with (acute) exacerbation: Secondary | ICD-10-CM | POA: Diagnosis not present

## 2018-04-02 DIAGNOSIS — Z79899 Other long term (current) drug therapy: Secondary | ICD-10-CM | POA: Diagnosis not present

## 2018-04-02 DIAGNOSIS — W19XXXD Unspecified fall, subsequent encounter: Secondary | ICD-10-CM | POA: Diagnosis not present

## 2018-04-02 DIAGNOSIS — S72331D Displaced oblique fracture of shaft of right femur, subsequent encounter for closed fracture with routine healing: Secondary | ICD-10-CM | POA: Diagnosis not present

## 2018-04-05 DIAGNOSIS — F0280 Dementia in other diseases classified elsewhere without behavioral disturbance: Secondary | ICD-10-CM | POA: Diagnosis not present

## 2018-04-05 DIAGNOSIS — J45901 Unspecified asthma with (acute) exacerbation: Secondary | ICD-10-CM | POA: Diagnosis not present

## 2018-04-05 DIAGNOSIS — S72331D Displaced oblique fracture of shaft of right femur, subsequent encounter for closed fracture with routine healing: Secondary | ICD-10-CM | POA: Diagnosis not present

## 2018-04-05 DIAGNOSIS — G20 Parkinson's disease: Secondary | ICD-10-CM | POA: Diagnosis not present

## 2018-04-05 DIAGNOSIS — G40909 Epilepsy, unspecified, not intractable, without status epilepticus: Secondary | ICD-10-CM | POA: Diagnosis not present

## 2018-04-05 DIAGNOSIS — M9711XD Periprosthetic fracture around internal prosthetic right knee joint, subsequent encounter: Secondary | ICD-10-CM | POA: Diagnosis not present

## 2018-04-06 DIAGNOSIS — N390 Urinary tract infection, site not specified: Secondary | ICD-10-CM | POA: Diagnosis not present

## 2018-04-06 IMAGING — DX DG ANKLE COMPLETE 3+V*R*
3 series · 3 of 3 positions shown · non-contrast
Comparison: None.

CLINICAL DATA: Right ankle pain following fall, initial encounter

EXAM:
RIGHT ANKLE - COMPLETE 3+ VIEW

[ankle ap]
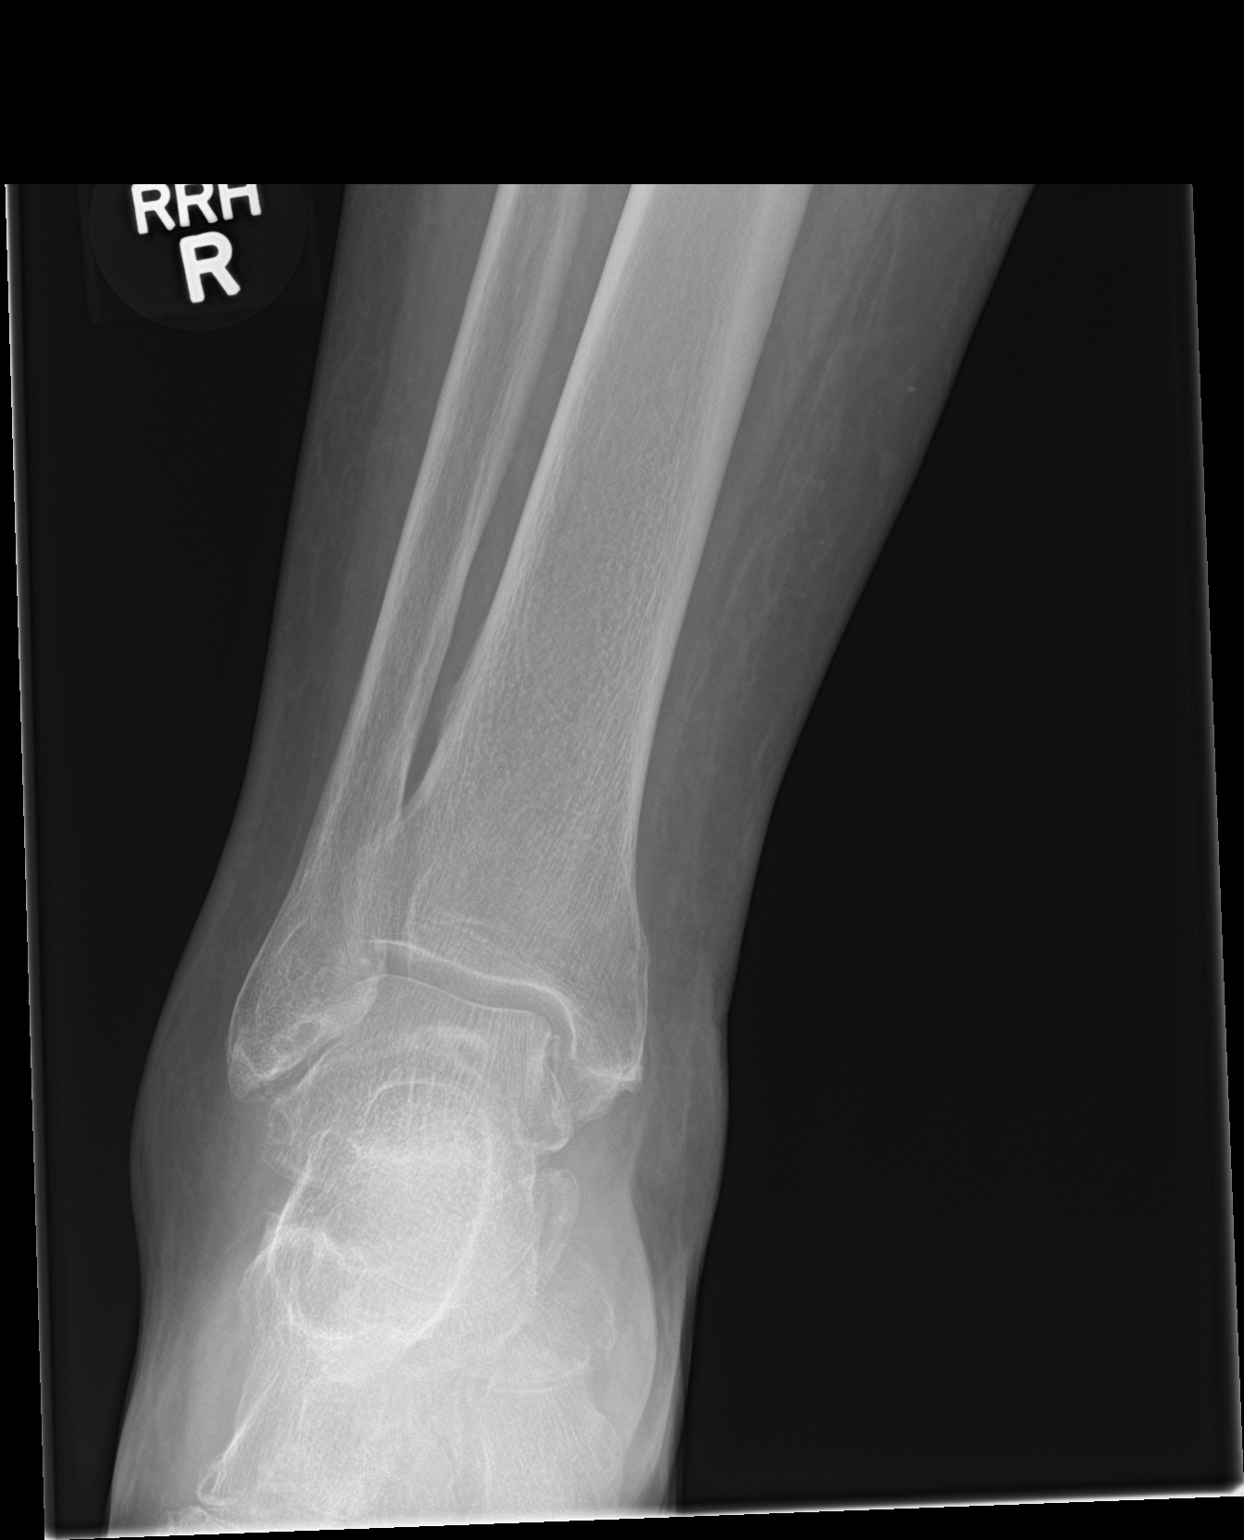

[ankle obl]
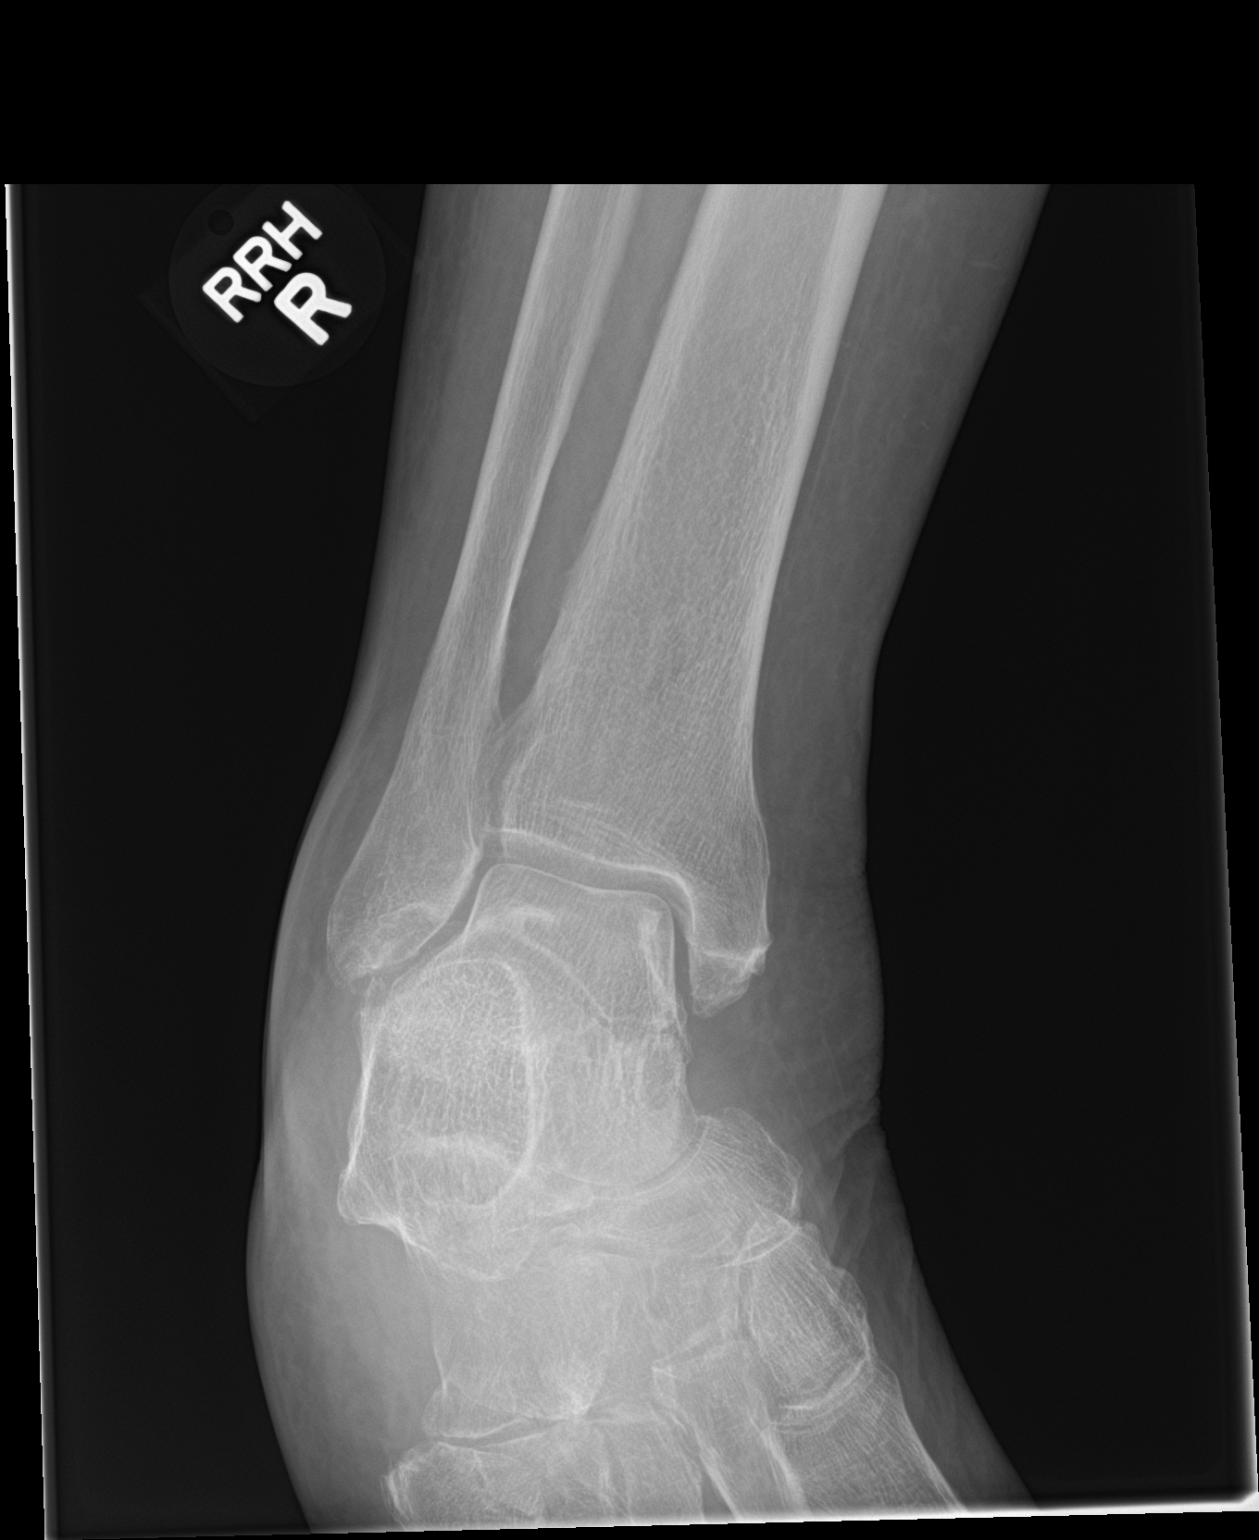

[ankle lat]
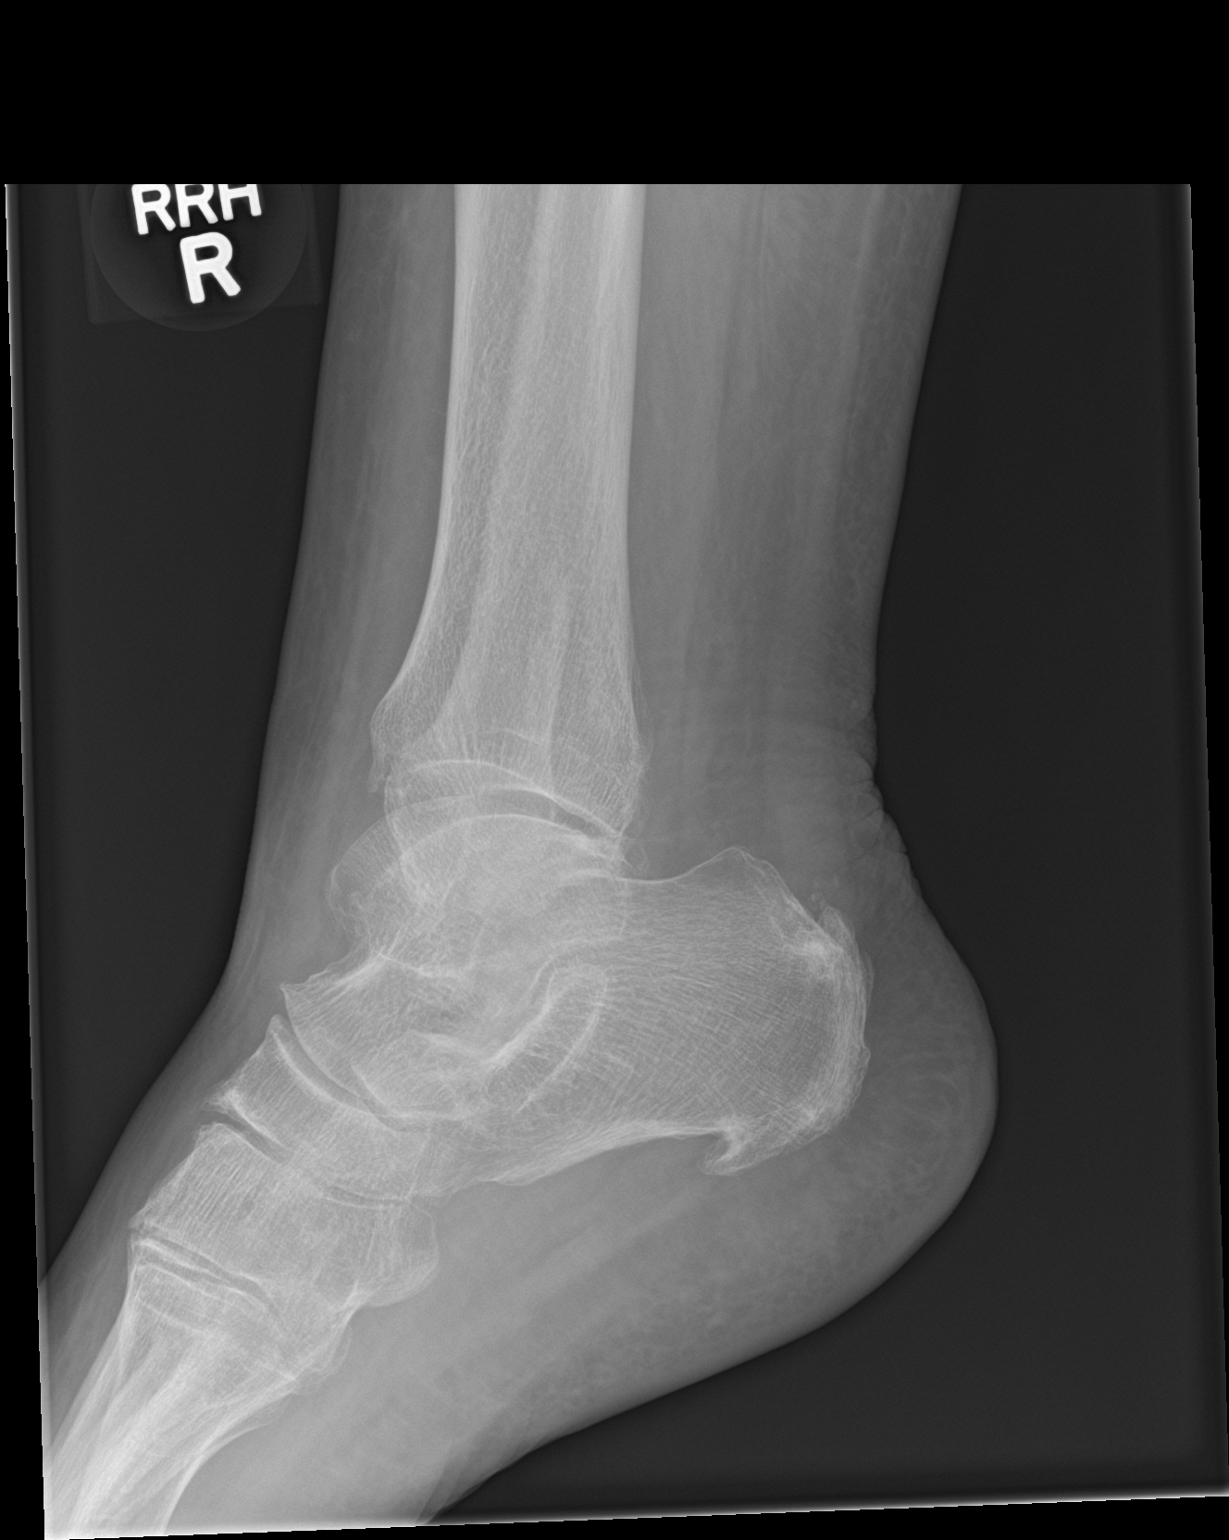

[3 of 3 positions shown; findings below may reference images not displayed]

FINDINGS: Mild soft tissue swelling is noted. No acute fracture or dislocation
is seen. Calcaneal spurring is noted. Tarsal degenerative changes
are noted as well.
IMPRESSION: No acute bony abnormality noted.

## 2018-04-06 IMAGING — DX DG HIP (WITH OR WITHOUT PELVIS) 5+V BILAT
5 series · 5 of 5 positions shown · non-contrast
Comparison: None.

CLINICAL DATA: Bilateral hip pain. Symptoms after falling on wet
bath for 3 days. Initial encounter.

EXAM:
DG HIP (WITH OR WITHOUT PELVIS) 5+V BILAT

[pelvis ap]
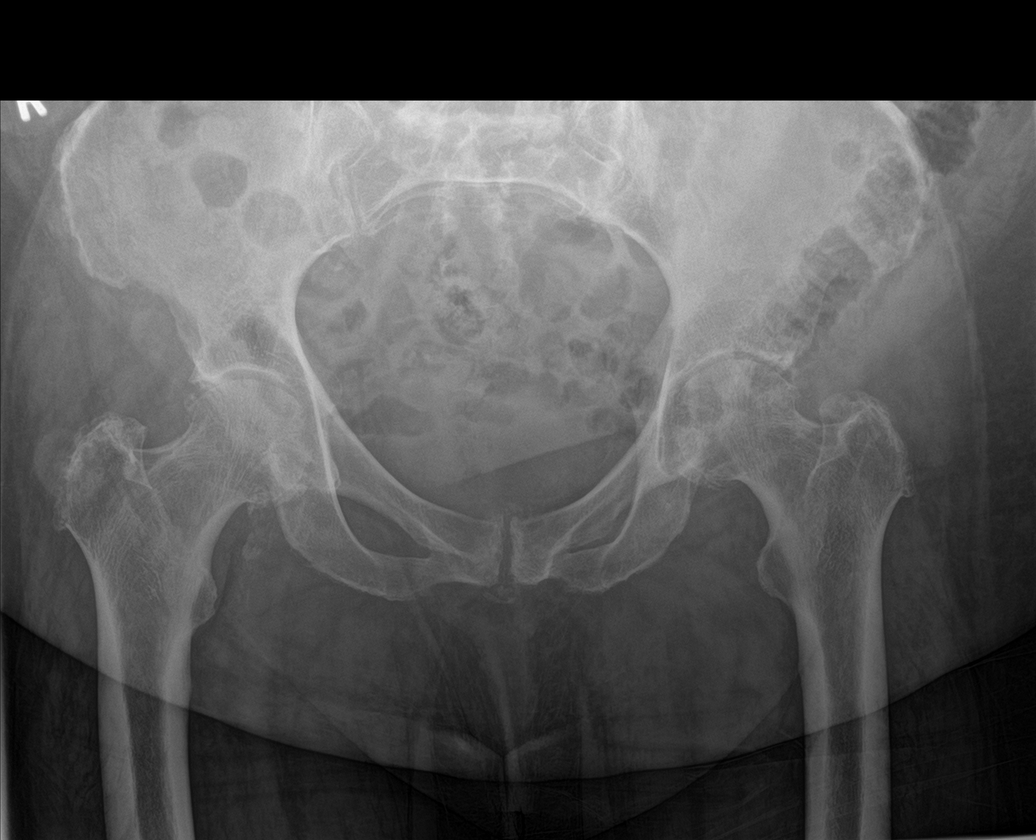

[hip ap (1 of 2)]
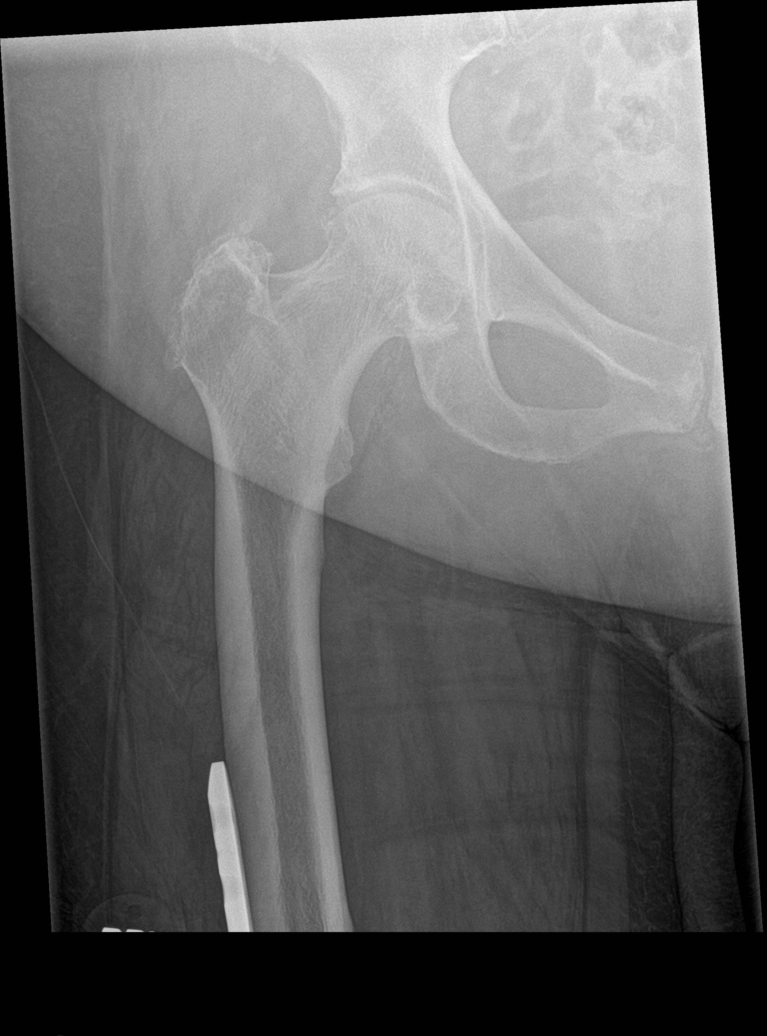

[hip lat (1 of 2)]
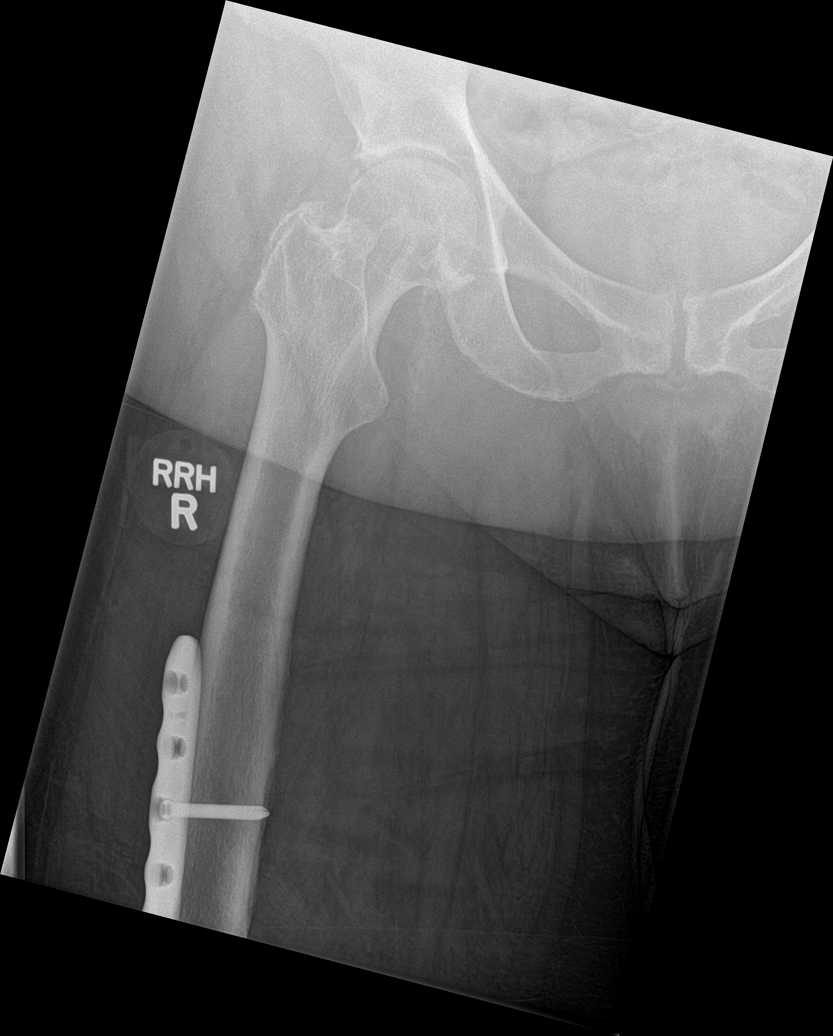

[hip ap (2 of 2)]
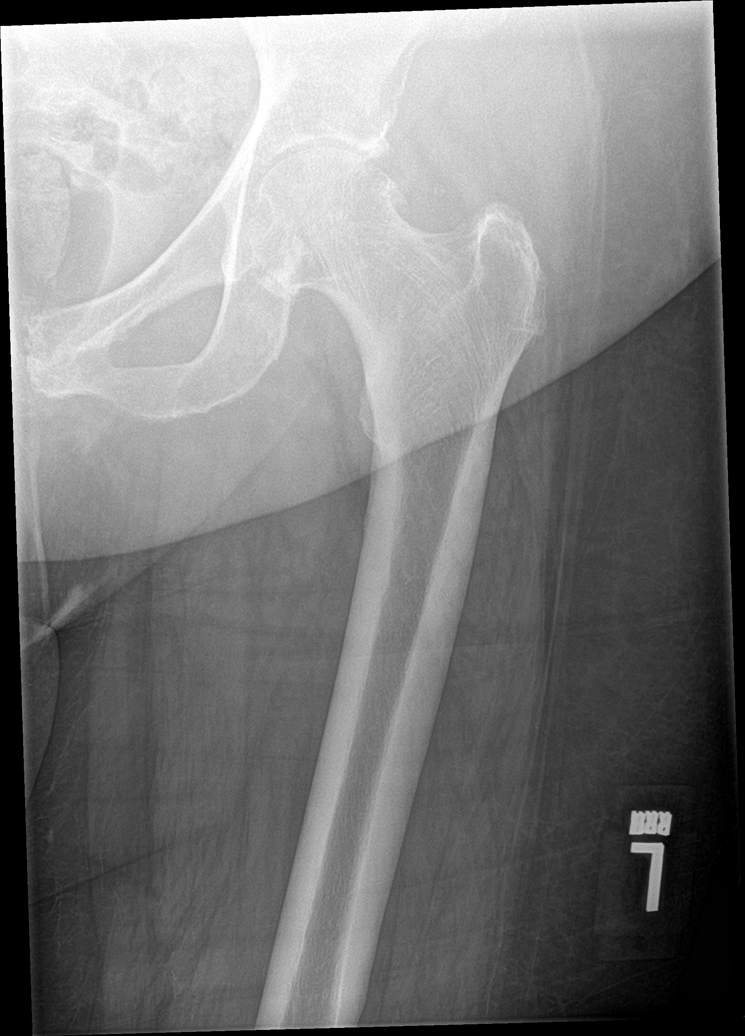

[hip lat (2 of 2)]
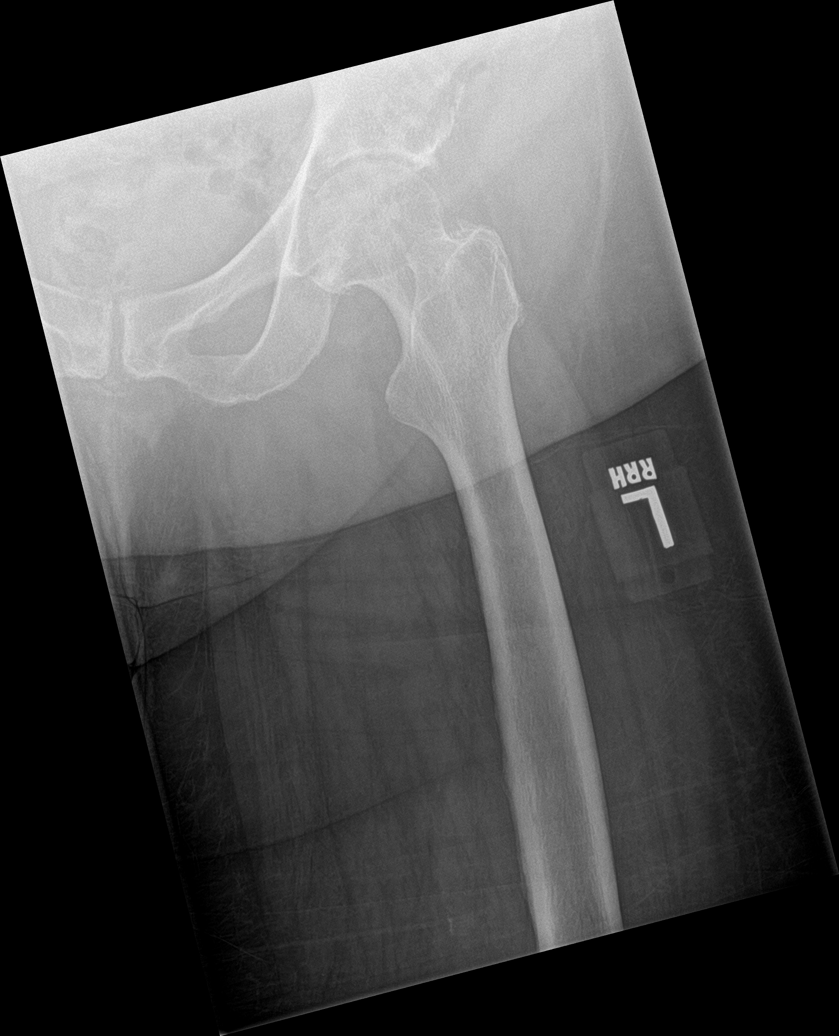

[5 of 5 positions shown; findings below may reference images not displayed]

FINDINGS: Hips are located. There is no acute fracture. The vascular
calcifications are noted. More distal right femur plate and screw
fixation is partially imaged. Degenerative changes are noted in the
lower lumbar spine.
IMPRESSION: 1. Mild degenerative changes of the hips bilaterally.
2. No acute abnormality.

## 2018-04-07 ENCOUNTER — Telehealth: Payer: Self-pay | Admitting: Orthopedic Surgery

## 2018-04-07 DIAGNOSIS — F0280 Dementia in other diseases classified elsewhere without behavioral disturbance: Secondary | ICD-10-CM | POA: Diagnosis not present

## 2018-04-07 DIAGNOSIS — G20 Parkinson's disease: Secondary | ICD-10-CM | POA: Diagnosis not present

## 2018-04-07 DIAGNOSIS — S72331D Displaced oblique fracture of shaft of right femur, subsequent encounter for closed fracture with routine healing: Secondary | ICD-10-CM | POA: Diagnosis not present

## 2018-04-07 DIAGNOSIS — G40909 Epilepsy, unspecified, not intractable, without status epilepticus: Secondary | ICD-10-CM | POA: Diagnosis not present

## 2018-04-07 DIAGNOSIS — J45901 Unspecified asthma with (acute) exacerbation: Secondary | ICD-10-CM | POA: Diagnosis not present

## 2018-04-07 DIAGNOSIS — M9711XD Periprosthetic fracture around internal prosthetic right knee joint, subsequent encounter: Secondary | ICD-10-CM | POA: Diagnosis not present

## 2018-04-07 NOTE — Telephone Encounter (Signed)
Call received from [facility], occupational therapist, [place] - ph# [phone] - requests verbal orders to continue home therapy 2 times per week for 3 weeks.

## 2018-04-07 NOTE — Telephone Encounter (Signed)
I called [name] to give verbal orders to continue therapy patient is improving with therapy.

## 2018-04-09 DIAGNOSIS — J45901 Unspecified asthma with (acute) exacerbation: Secondary | ICD-10-CM | POA: Diagnosis not present

## 2018-04-09 DIAGNOSIS — G40909 Epilepsy, unspecified, not intractable, without status epilepticus: Secondary | ICD-10-CM | POA: Diagnosis not present

## 2018-04-09 DIAGNOSIS — F0280 Dementia in other diseases classified elsewhere without behavioral disturbance: Secondary | ICD-10-CM | POA: Diagnosis not present

## 2018-04-09 DIAGNOSIS — M9711XD Periprosthetic fracture around internal prosthetic right knee joint, subsequent encounter: Secondary | ICD-10-CM | POA: Diagnosis not present

## 2018-04-09 DIAGNOSIS — G20 Parkinson's disease: Secondary | ICD-10-CM | POA: Diagnosis not present

## 2018-04-09 DIAGNOSIS — S72331D Displaced oblique fracture of shaft of right femur, subsequent encounter for closed fracture with routine healing: Secondary | ICD-10-CM | POA: Diagnosis not present

## 2018-04-12 DIAGNOSIS — S72331D Displaced oblique fracture of shaft of right femur, subsequent encounter for closed fracture with routine healing: Secondary | ICD-10-CM | POA: Diagnosis not present

## 2018-04-12 DIAGNOSIS — G20 Parkinson's disease: Secondary | ICD-10-CM | POA: Diagnosis not present

## 2018-04-12 DIAGNOSIS — M9711XD Periprosthetic fracture around internal prosthetic right knee joint, subsequent encounter: Secondary | ICD-10-CM | POA: Diagnosis not present

## 2018-04-12 DIAGNOSIS — G40909 Epilepsy, unspecified, not intractable, without status epilepticus: Secondary | ICD-10-CM | POA: Diagnosis not present

## 2018-04-12 DIAGNOSIS — F0280 Dementia in other diseases classified elsewhere without behavioral disturbance: Secondary | ICD-10-CM | POA: Diagnosis not present

## 2018-04-12 DIAGNOSIS — J45901 Unspecified asthma with (acute) exacerbation: Secondary | ICD-10-CM | POA: Diagnosis not present

## 2018-04-13 DIAGNOSIS — M9711XD Periprosthetic fracture around internal prosthetic right knee joint, subsequent encounter: Secondary | ICD-10-CM | POA: Diagnosis not present

## 2018-04-13 DIAGNOSIS — G40909 Epilepsy, unspecified, not intractable, without status epilepticus: Secondary | ICD-10-CM | POA: Diagnosis not present

## 2018-04-13 DIAGNOSIS — J45901 Unspecified asthma with (acute) exacerbation: Secondary | ICD-10-CM | POA: Diagnosis not present

## 2018-04-13 DIAGNOSIS — F0280 Dementia in other diseases classified elsewhere without behavioral disturbance: Secondary | ICD-10-CM | POA: Diagnosis not present

## 2018-04-13 DIAGNOSIS — G20 Parkinson's disease: Secondary | ICD-10-CM | POA: Diagnosis not present

## 2018-04-13 DIAGNOSIS — S72331D Displaced oblique fracture of shaft of right femur, subsequent encounter for closed fracture with routine healing: Secondary | ICD-10-CM | POA: Diagnosis not present

## 2018-04-14 DIAGNOSIS — M9711XD Periprosthetic fracture around internal prosthetic right knee joint, subsequent encounter: Secondary | ICD-10-CM | POA: Diagnosis not present

## 2018-04-14 DIAGNOSIS — G40909 Epilepsy, unspecified, not intractable, without status epilepticus: Secondary | ICD-10-CM | POA: Diagnosis not present

## 2018-04-14 DIAGNOSIS — F0280 Dementia in other diseases classified elsewhere without behavioral disturbance: Secondary | ICD-10-CM | POA: Diagnosis not present

## 2018-04-14 DIAGNOSIS — S72331D Displaced oblique fracture of shaft of right femur, subsequent encounter for closed fracture with routine healing: Secondary | ICD-10-CM | POA: Diagnosis not present

## 2018-04-14 DIAGNOSIS — J45901 Unspecified asthma with (acute) exacerbation: Secondary | ICD-10-CM | POA: Diagnosis not present

## 2018-04-14 DIAGNOSIS — G20 Parkinson's disease: Secondary | ICD-10-CM | POA: Diagnosis not present

## 2018-04-19 DIAGNOSIS — F0280 Dementia in other diseases classified elsewhere without behavioral disturbance: Secondary | ICD-10-CM | POA: Diagnosis not present

## 2018-04-19 DIAGNOSIS — J45901 Unspecified asthma with (acute) exacerbation: Secondary | ICD-10-CM | POA: Diagnosis not present

## 2018-04-19 DIAGNOSIS — M9711XD Periprosthetic fracture around internal prosthetic right knee joint, subsequent encounter: Secondary | ICD-10-CM | POA: Diagnosis not present

## 2018-04-19 DIAGNOSIS — G20 Parkinson's disease: Secondary | ICD-10-CM | POA: Diagnosis not present

## 2018-04-19 DIAGNOSIS — G40909 Epilepsy, unspecified, not intractable, without status epilepticus: Secondary | ICD-10-CM | POA: Diagnosis not present

## 2018-04-19 DIAGNOSIS — S72331D Displaced oblique fracture of shaft of right femur, subsequent encounter for closed fracture with routine healing: Secondary | ICD-10-CM | POA: Diagnosis not present

## 2018-04-21 DIAGNOSIS — S72331D Displaced oblique fracture of shaft of right femur, subsequent encounter for closed fracture with routine healing: Secondary | ICD-10-CM | POA: Diagnosis not present

## 2018-04-21 DIAGNOSIS — F0280 Dementia in other diseases classified elsewhere without behavioral disturbance: Secondary | ICD-10-CM | POA: Diagnosis not present

## 2018-04-21 DIAGNOSIS — G20 Parkinson's disease: Secondary | ICD-10-CM | POA: Diagnosis not present

## 2018-04-21 DIAGNOSIS — G40909 Epilepsy, unspecified, not intractable, without status epilepticus: Secondary | ICD-10-CM | POA: Diagnosis not present

## 2018-04-21 DIAGNOSIS — J45901 Unspecified asthma with (acute) exacerbation: Secondary | ICD-10-CM | POA: Diagnosis not present

## 2018-04-21 DIAGNOSIS — M9711XD Periprosthetic fracture around internal prosthetic right knee joint, subsequent encounter: Secondary | ICD-10-CM | POA: Diagnosis not present

## 2018-04-22 ENCOUNTER — Other Ambulatory Visit: Payer: Self-pay | Admitting: Radiology

## 2018-04-22 DIAGNOSIS — S72331D Displaced oblique fracture of shaft of right femur, subsequent encounter for closed fracture with routine healing: Secondary | ICD-10-CM | POA: Diagnosis not present

## 2018-04-22 DIAGNOSIS — G20 Parkinson's disease: Secondary | ICD-10-CM | POA: Diagnosis not present

## 2018-04-22 DIAGNOSIS — G40909 Epilepsy, unspecified, not intractable, without status epilepticus: Secondary | ICD-10-CM | POA: Diagnosis not present

## 2018-04-22 DIAGNOSIS — J45901 Unspecified asthma with (acute) exacerbation: Secondary | ICD-10-CM | POA: Diagnosis not present

## 2018-04-22 DIAGNOSIS — F0280 Dementia in other diseases classified elsewhere without behavioral disturbance: Secondary | ICD-10-CM | POA: Diagnosis not present

## 2018-04-22 DIAGNOSIS — M9711XD Periprosthetic fracture around internal prosthetic right knee joint, subsequent encounter: Secondary | ICD-10-CM | POA: Diagnosis not present

## 2018-04-22 NOTE — Telephone Encounter (Signed)
Patient is asking for a refill of hydrocodone, she is aware it will be Tuesday before this message is addressed. Please advise on refill, [facility] [address]

## 2018-04-23 DIAGNOSIS — G20 Parkinson's disease: Secondary | ICD-10-CM | POA: Diagnosis not present

## 2018-04-23 DIAGNOSIS — J45901 Unspecified asthma with (acute) exacerbation: Secondary | ICD-10-CM | POA: Diagnosis not present

## 2018-04-23 DIAGNOSIS — G40909 Epilepsy, unspecified, not intractable, without status epilepticus: Secondary | ICD-10-CM | POA: Diagnosis not present

## 2018-04-23 DIAGNOSIS — S72331D Displaced oblique fracture of shaft of right femur, subsequent encounter for closed fracture with routine healing: Secondary | ICD-10-CM | POA: Diagnosis not present

## 2018-04-23 DIAGNOSIS — F0280 Dementia in other diseases classified elsewhere without behavioral disturbance: Secondary | ICD-10-CM | POA: Diagnosis not present

## 2018-04-23 DIAGNOSIS — M9711XD Periprosthetic fracture around internal prosthetic right knee joint, subsequent encounter: Secondary | ICD-10-CM | POA: Diagnosis not present

## 2018-04-27 MED ORDER — HYDROCODONE-ACETAMINOPHEN 5-325 MG PO TABS: 1.0000 | ORAL_TABLET | Freq: Four times a day (QID) | ORAL | 0 refills | Status: DC | PRN

## 2018-04-27 NOTE — Addendum Note (Signed)
Addended byCandice Camp on: 04/27/2018 08:17 AM   Modules accepted: Orders

## 2018-05-02 ENCOUNTER — Other Ambulatory Visit: Payer: Self-pay | Admitting: Adult Health

## 2018-05-05 ENCOUNTER — Ambulatory Visit (INDEPENDENT_AMBULATORY_CARE_PROVIDER_SITE_OTHER): Payer: Medicare Other

## 2018-05-05 ENCOUNTER — Ambulatory Visit (INDEPENDENT_AMBULATORY_CARE_PROVIDER_SITE_OTHER): Payer: Medicare Other | Admitting: Orthopedic Surgery

## 2018-05-05 ENCOUNTER — Other Ambulatory Visit: Payer: Self-pay

## 2018-05-05 ENCOUNTER — Encounter: Payer: Self-pay | Admitting: Orthopedic Surgery

## 2018-05-05 VITALS — Temp 98.5°F | Ht 61.0 in | Wt 230.0 lb

## 2018-05-05 DIAGNOSIS — S72401D Unspecified fracture of lower end of right femur, subsequent encounter for closed fracture with routine healing: Secondary | ICD-10-CM | POA: Diagnosis not present

## 2018-05-05 NOTE — Progress Notes (Signed)
Patient ID: Tiffany Hartman, female   DOB: 03/22/1927, 85 y.o.   MRN: [id number]  FRACTURE CARE   Chief Complaint  Patient presents with  . Knee Injury    right distal femur fracture 02/12/18 finished therapy not able to WB much     Encounter Diagnosis  Name Primary?  . Closed fracture of distal end of right femur with routine healing, unspecified fracture morphology, subsequent encounter 02/12/18 Yes    CURRENT TREATMENT : braced weight bearing as tolerated   POST INJURY DAY: [place] yes   Tiffany Hartman is doing well she is ambulating with a walker in her home  Her x-ray looks like the fracture healed without any displacement  Her exam shows that she can lift her leg up in the air she can bend and straighten her knee  I have released her

## 2018-06-08 ENCOUNTER — Other Ambulatory Visit: Payer: Self-pay | Admitting: Orthopedic Surgery

## 2018-06-08 NOTE — Telephone Encounter (Signed)
Hydrocodone-Acetaminophen  5/325 mg  Qty 28 Tablets  Take 1 tablet by mouth every 6 (six) hours as needed for moderate pain.  [facility]

## 2018-06-09 MED ORDER — HYDROCODONE-ACETAMINOPHEN 5-325 MG PO TABS: 1.0000 | ORAL_TABLET | Freq: Four times a day (QID) | ORAL | 0 refills | Status: DC | PRN

## 2018-06-09 NOTE — Telephone Encounter (Signed)
CHRONIC PAIN  NOT A SURGICAL CANDIDATE  ACCEPTS RISKS OF BENZOS WITH OPIOIDS

## 2018-06-21 DIAGNOSIS — J441 Chronic obstructive pulmonary disease with (acute) exacerbation: Secondary | ICD-10-CM | POA: Diagnosis not present

## 2018-07-01 DIAGNOSIS — J449 Chronic obstructive pulmonary disease, unspecified: Secondary | ICD-10-CM | POA: Diagnosis not present

## 2018-07-01 DIAGNOSIS — K625 Hemorrhage of anus and rectum: Secondary | ICD-10-CM | POA: Diagnosis not present

## 2018-07-01 DIAGNOSIS — D649 Anemia, unspecified: Secondary | ICD-10-CM | POA: Diagnosis not present

## 2018-07-03 ENCOUNTER — Encounter (HOSPITAL_COMMUNITY)
Admission: RE | Admit: 2018-07-03 | Discharge: 2018-07-03 | Disposition: A | Discharge status: Home / Self Care | Payer: Medicare Other | Source: Skilled Nursing Facility | Attending: Internal Medicine | Admitting: Internal Medicine

## 2018-07-03 DIAGNOSIS — K625 Hemorrhage of anus and rectum: Secondary | ICD-10-CM | POA: Insufficient documentation

## 2018-07-03 DIAGNOSIS — E119 Type 2 diabetes mellitus without complications: Secondary | ICD-10-CM | POA: Diagnosis not present

## 2018-07-03 DIAGNOSIS — F0390 Unspecified dementia without behavioral disturbance: Secondary | ICD-10-CM | POA: Diagnosis not present

## 2018-07-03 DIAGNOSIS — I10 Essential (primary) hypertension: Secondary | ICD-10-CM | POA: Diagnosis not present

## 2018-07-03 DIAGNOSIS — J449 Chronic obstructive pulmonary disease, unspecified: Secondary | ICD-10-CM | POA: Diagnosis not present

## 2018-07-03 DIAGNOSIS — D649 Anemia, unspecified: Secondary | ICD-10-CM | POA: Diagnosis not present

## 2018-07-03 DIAGNOSIS — J9611 Chronic respiratory failure with hypoxia: Secondary | ICD-10-CM | POA: Diagnosis not present

## 2018-07-03 LAB — CBC WITH DIFFERENTIAL/PLATELET
Abs Immature Granulocytes: 0.01 10*3/uL (ref 0.00–0.07)
Basophils Absolute: 0.1 10*3/uL (ref 0.0–0.1)
Basophils Relative: 1 %
Eosinophils Absolute: 0.4 10*3/uL (ref 0.0–0.5)
Eosinophils Relative: 6 %
HCT: 30.6 % — ABNORMAL LOW (ref 36.0–46.0)
Hemoglobin: 9.2 g/dL — ABNORMAL LOW (ref 12.0–15.0)
Immature Granulocytes: 0 %
Lymphocytes Relative: 21 %
Lymphs Abs: 1.6 10*3/uL (ref 0.7–4.0)
MCH: 28.5 pg (ref 26.0–34.0)
MCHC: 30.1 g/dL (ref 30.0–36.0)
MCV: 94.7 fL (ref 80.0–100.0)
Monocytes Absolute: 0.5 10*3/uL (ref 0.1–1.0)
Monocytes Relative: 6 %
Neutro Abs: 4.9 10*3/uL (ref 1.7–7.7)
Neutrophils Relative %: 66 %
Platelets: 352 10*3/uL (ref 150–400)
RBC: 3.23 MIL/uL — ABNORMAL LOW (ref 3.87–5.11)
RDW: 13.7 % (ref 11.5–15.5)
WBC: 7.4 10*3/uL (ref 4.0–10.5)
nRBC: 0 % (ref 0.0–0.2)

## 2018-07-03 LAB — COMPREHENSIVE METABOLIC PANEL
ALT: 9 U/L (ref 0–44)
AST: 12 U/L — ABNORMAL LOW (ref 15–41)
Albumin: 3.3 g/dL — ABNORMAL LOW (ref 3.5–5.0)
Alkaline Phosphatase: 64 U/L (ref 38–126)
Anion gap: 14 (ref 5–15)
BUN: 15 mg/dL (ref 8–23)
CO2: 26 mmol/L (ref 22–32)
Calcium: 9 mg/dL (ref 8.9–10.3)
Chloride: 102 mmol/L (ref 98–111)
Creatinine, Ser: 1.24 mg/dL — ABNORMAL HIGH (ref 0.44–1.00)
GFR calc Af Amer: 45 mL/min — ABNORMAL LOW (ref >60)
GFR calc non Af Amer: 38 mL/min — ABNORMAL LOW (ref >60)
Glucose, Bld: 154 mg/dL — ABNORMAL HIGH (ref 70–99)
Potassium: 4.1 mmol/L (ref 3.5–5.1)
Sodium: 142 mmol/L (ref 135–145)
Total Bilirubin: 0.4 mg/dL (ref 0.3–1.2)
Total Protein: 6.4 g/dL — ABNORMAL LOW (ref 6.5–8.1)

## 2018-07-08 DIAGNOSIS — J449 Chronic obstructive pulmonary disease, unspecified: Secondary | ICD-10-CM | POA: Diagnosis not present

## 2018-07-08 DIAGNOSIS — I10 Essential (primary) hypertension: Secondary | ICD-10-CM | POA: Diagnosis not present

## 2018-07-08 DIAGNOSIS — D649 Anemia, unspecified: Secondary | ICD-10-CM | POA: Diagnosis not present

## 2018-07-08 DIAGNOSIS — J9611 Chronic respiratory failure with hypoxia: Secondary | ICD-10-CM | POA: Diagnosis not present

## 2018-07-08 DIAGNOSIS — E119 Type 2 diabetes mellitus without complications: Secondary | ICD-10-CM | POA: Diagnosis not present

## 2018-07-08 DIAGNOSIS — F0390 Unspecified dementia without behavioral disturbance: Secondary | ICD-10-CM | POA: Diagnosis not present

## 2018-07-12 DIAGNOSIS — E119 Type 2 diabetes mellitus without complications: Secondary | ICD-10-CM | POA: Diagnosis not present

## 2018-07-12 DIAGNOSIS — I10 Essential (primary) hypertension: Secondary | ICD-10-CM | POA: Diagnosis not present

## 2018-07-12 DIAGNOSIS — J9611 Chronic respiratory failure with hypoxia: Secondary | ICD-10-CM | POA: Diagnosis not present

## 2018-07-12 DIAGNOSIS — D649 Anemia, unspecified: Secondary | ICD-10-CM | POA: Diagnosis not present

## 2018-07-12 DIAGNOSIS — J449 Chronic obstructive pulmonary disease, unspecified: Secondary | ICD-10-CM | POA: Diagnosis not present

## 2018-07-12 DIAGNOSIS — F0390 Unspecified dementia without behavioral disturbance: Secondary | ICD-10-CM | POA: Diagnosis not present

## 2018-07-18 ENCOUNTER — Other Ambulatory Visit: Payer: Self-pay | Admitting: Adult Health

## 2018-07-20 ENCOUNTER — Encounter: Payer: Self-pay | Admitting: Gastroenterology

## 2018-07-21 ENCOUNTER — Other Ambulatory Visit: Payer: Self-pay | Admitting: Adult Health

## 2018-07-27 DIAGNOSIS — D649 Anemia, unspecified: Secondary | ICD-10-CM | POA: Diagnosis not present

## 2018-07-27 DIAGNOSIS — J9611 Chronic respiratory failure with hypoxia: Secondary | ICD-10-CM | POA: Diagnosis not present

## 2018-07-27 DIAGNOSIS — I10 Essential (primary) hypertension: Secondary | ICD-10-CM | POA: Diagnosis not present

## 2018-07-27 DIAGNOSIS — E119 Type 2 diabetes mellitus without complications: Secondary | ICD-10-CM | POA: Diagnosis not present

## 2018-07-27 DIAGNOSIS — F0390 Unspecified dementia without behavioral disturbance: Secondary | ICD-10-CM | POA: Diagnosis not present

## 2018-07-27 DIAGNOSIS — J449 Chronic obstructive pulmonary disease, unspecified: Secondary | ICD-10-CM | POA: Diagnosis not present

## 2018-08-02 DIAGNOSIS — I10 Essential (primary) hypertension: Secondary | ICD-10-CM | POA: Diagnosis not present

## 2018-08-02 DIAGNOSIS — F0390 Unspecified dementia without behavioral disturbance: Secondary | ICD-10-CM | POA: Diagnosis not present

## 2018-08-02 DIAGNOSIS — D649 Anemia, unspecified: Secondary | ICD-10-CM | POA: Diagnosis not present

## 2018-08-02 DIAGNOSIS — J9611 Chronic respiratory failure with hypoxia: Secondary | ICD-10-CM | POA: Diagnosis not present

## 2018-08-02 DIAGNOSIS — E119 Type 2 diabetes mellitus without complications: Secondary | ICD-10-CM | POA: Diagnosis not present

## 2018-08-02 DIAGNOSIS — J449 Chronic obstructive pulmonary disease, unspecified: Secondary | ICD-10-CM | POA: Diagnosis not present

## 2018-08-10 DIAGNOSIS — D649 Anemia, unspecified: Secondary | ICD-10-CM | POA: Diagnosis not present

## 2018-08-10 DIAGNOSIS — I10 Essential (primary) hypertension: Secondary | ICD-10-CM | POA: Diagnosis not present

## 2018-08-10 DIAGNOSIS — F0390 Unspecified dementia without behavioral disturbance: Secondary | ICD-10-CM | POA: Diagnosis not present

## 2018-08-10 DIAGNOSIS — J449 Chronic obstructive pulmonary disease, unspecified: Secondary | ICD-10-CM | POA: Diagnosis not present

## 2018-08-10 DIAGNOSIS — E119 Type 2 diabetes mellitus without complications: Secondary | ICD-10-CM | POA: Diagnosis not present

## 2018-08-10 DIAGNOSIS — J9611 Chronic respiratory failure with hypoxia: Secondary | ICD-10-CM | POA: Diagnosis not present

## 2018-08-16 DIAGNOSIS — E1165 Type 2 diabetes mellitus with hyperglycemia: Secondary | ICD-10-CM | POA: Diagnosis not present

## 2018-08-16 DIAGNOSIS — J9690 Respiratory failure, unspecified, unspecified whether with hypoxia or hypercapnia: Secondary | ICD-10-CM | POA: Diagnosis not present

## 2018-08-16 DIAGNOSIS — I10 Essential (primary) hypertension: Secondary | ICD-10-CM | POA: Diagnosis not present

## 2018-08-18 ENCOUNTER — Ambulatory Visit: Payer: Medicare Other | Admitting: Gastroenterology

## 2018-08-25 ENCOUNTER — Telehealth: Payer: Self-pay | Admitting: Orthopedic Surgery

## 2018-08-25 DIAGNOSIS — D649 Anemia, unspecified: Secondary | ICD-10-CM | POA: Diagnosis not present

## 2018-08-25 DIAGNOSIS — J449 Chronic obstructive pulmonary disease, unspecified: Secondary | ICD-10-CM | POA: Diagnosis not present

## 2018-08-25 DIAGNOSIS — J9611 Chronic respiratory failure with hypoxia: Secondary | ICD-10-CM | POA: Diagnosis not present

## 2018-08-25 DIAGNOSIS — I10 Essential (primary) hypertension: Secondary | ICD-10-CM | POA: Diagnosis not present

## 2018-08-25 DIAGNOSIS — F0390 Unspecified dementia without behavioral disturbance: Secondary | ICD-10-CM | POA: Diagnosis not present

## 2018-08-25 DIAGNOSIS — E119 Type 2 diabetes mellitus without complications: Secondary | ICD-10-CM | POA: Diagnosis not present

## 2018-08-25 NOTE — Telephone Encounter (Signed)
Patient called for refill of medication*last seen in office 05/05/18: HYDROcodone-acetaminophen (NORCO/VICODIN) 5-325 MG tablet 28 tablet  - [facility] [place]

## 2018-08-25 NOTE — Telephone Encounter (Signed)
Make rx requests thru pharmacy

## 2018-08-26 DIAGNOSIS — I10 Essential (primary) hypertension: Secondary | ICD-10-CM | POA: Diagnosis not present

## 2018-08-26 DIAGNOSIS — J449 Chronic obstructive pulmonary disease, unspecified: Secondary | ICD-10-CM | POA: Diagnosis not present

## 2018-08-26 NOTE — Telephone Encounter (Signed)
Notified patient; patient and daughter, designated party contact aware.

## 2018-09-01 ENCOUNTER — Other Ambulatory Visit: Payer: Self-pay

## 2018-09-01 MED ORDER — HYDROCODONE-ACETAMINOPHEN 5-325 MG PO TABS: 1.0000 | ORAL_TABLET | Freq: Four times a day (QID) | ORAL | 0 refills | Status: DC | PRN

## 2018-09-01 NOTE — Telephone Encounter (Signed)
Hydrocodone- Acetaminophen 5/325mg  Qty 28 Tablets Take 1 tablet by mouth every 6(six) hours as needed for moderate pain.  [facility] [place] [facility]

## 2018-09-03 DIAGNOSIS — Z23 Encounter for immunization: Secondary | ICD-10-CM | POA: Diagnosis not present

## 2018-10-18 DIAGNOSIS — J449 Chronic obstructive pulmonary disease, unspecified: Secondary | ICD-10-CM | POA: Diagnosis not present

## 2018-10-18 DIAGNOSIS — J9690 Respiratory failure, unspecified, unspecified whether with hypoxia or hypercapnia: Secondary | ICD-10-CM | POA: Diagnosis not present

## 2018-10-18 DIAGNOSIS — Z1331 Encounter for screening for depression: Secondary | ICD-10-CM | POA: Diagnosis not present

## 2018-10-18 DIAGNOSIS — Z1389 Encounter for screening for other disorder: Secondary | ICD-10-CM | POA: Diagnosis not present

## 2018-10-18 DIAGNOSIS — E1165 Type 2 diabetes mellitus with hyperglycemia: Secondary | ICD-10-CM | POA: Diagnosis not present

## 2018-10-27 DIAGNOSIS — H401133 Primary open-angle glaucoma, bilateral, severe stage: Secondary | ICD-10-CM | POA: Diagnosis not present

## 2018-10-27 DIAGNOSIS — H47233 Glaucomatous optic atrophy, bilateral: Secondary | ICD-10-CM | POA: Diagnosis not present

## 2018-11-17 ENCOUNTER — Other Ambulatory Visit: Payer: Self-pay | Admitting: Orthopedic Surgery

## 2018-11-17 MED ORDER — HYDROCODONE-ACETAMINOPHEN 5-325 MG PO TABS: 1.0000 | ORAL_TABLET | Freq: Four times a day (QID) | ORAL | 0 refills | Status: DC | PRN

## 2018-11-17 NOTE — Telephone Encounter (Signed)
Patient called to request refill on: HYDROcodone-acetaminophen (NORCO/VICODIN) 5-325 MG tablet 28 tablet  -[facility], [place], if approved

## 2018-11-29 ENCOUNTER — Other Ambulatory Visit: Payer: Self-pay

## 2018-11-29 DIAGNOSIS — F0390 Unspecified dementia without behavioral disturbance: Secondary | ICD-10-CM | POA: Diagnosis not present

## 2018-11-29 DIAGNOSIS — Z7902 Long term (current) use of antithrombotics/antiplatelets: Secondary | ICD-10-CM | POA: Diagnosis not present

## 2018-11-29 DIAGNOSIS — Z8673 Personal history of transient ischemic attack (TIA), and cerebral infarction without residual deficits: Secondary | ICD-10-CM | POA: Diagnosis not present

## 2018-11-29 DIAGNOSIS — M159 Polyosteoarthritis, unspecified: Secondary | ICD-10-CM | POA: Diagnosis not present

## 2018-11-29 DIAGNOSIS — Z853 Personal history of malignant neoplasm of breast: Secondary | ICD-10-CM | POA: Diagnosis not present

## 2018-11-29 DIAGNOSIS — Z79899 Other long term (current) drug therapy: Secondary | ICD-10-CM | POA: Diagnosis not present

## 2018-11-29 DIAGNOSIS — E782 Mixed hyperlipidemia: Secondary | ICD-10-CM | POA: Diagnosis not present

## 2018-11-29 DIAGNOSIS — Z9981 Dependence on supplemental oxygen: Secondary | ICD-10-CM | POA: Diagnosis not present

## 2018-11-29 DIAGNOSIS — H5461 Unqualified visual loss, right eye, normal vision left eye: Secondary | ICD-10-CM | POA: Diagnosis not present

## 2018-11-29 DIAGNOSIS — J449 Chronic obstructive pulmonary disease, unspecified: Secondary | ICD-10-CM | POA: Diagnosis not present

## 2018-11-29 DIAGNOSIS — J9611 Chronic respiratory failure with hypoxia: Secondary | ICD-10-CM | POA: Diagnosis not present

## 2018-11-29 DIAGNOSIS — E1165 Type 2 diabetes mellitus with hyperglycemia: Secondary | ICD-10-CM | POA: Diagnosis not present

## 2018-11-29 DIAGNOSIS — F3340 Major depressive disorder, recurrent, in remission, unspecified: Secondary | ICD-10-CM | POA: Diagnosis not present

## 2018-11-29 DIAGNOSIS — R531 Weakness: Secondary | ICD-10-CM | POA: Diagnosis not present

## 2018-11-29 DIAGNOSIS — E1142 Type 2 diabetes mellitus with diabetic polyneuropathy: Secondary | ICD-10-CM | POA: Diagnosis not present

## 2018-11-29 DIAGNOSIS — Z7984 Long term (current) use of oral hypoglycemic drugs: Secondary | ICD-10-CM | POA: Diagnosis not present

## 2018-11-29 DIAGNOSIS — H4010X0 Unspecified open-angle glaucoma, stage unspecified: Secondary | ICD-10-CM | POA: Diagnosis not present

## 2018-11-29 DIAGNOSIS — Z6839 Body mass index (BMI) 39.0-39.9, adult: Secondary | ICD-10-CM | POA: Diagnosis not present

## 2018-11-29 DIAGNOSIS — I10 Essential (primary) hypertension: Secondary | ICD-10-CM | POA: Diagnosis not present

## 2018-12-01 DIAGNOSIS — J9611 Chronic respiratory failure with hypoxia: Secondary | ICD-10-CM | POA: Diagnosis not present

## 2018-12-01 DIAGNOSIS — E1142 Type 2 diabetes mellitus with diabetic polyneuropathy: Secondary | ICD-10-CM | POA: Diagnosis not present

## 2018-12-01 DIAGNOSIS — J449 Chronic obstructive pulmonary disease, unspecified: Secondary | ICD-10-CM | POA: Diagnosis not present

## 2018-12-01 DIAGNOSIS — R531 Weakness: Secondary | ICD-10-CM | POA: Diagnosis not present

## 2018-12-01 DIAGNOSIS — M159 Polyosteoarthritis, unspecified: Secondary | ICD-10-CM | POA: Diagnosis not present

## 2018-12-01 DIAGNOSIS — E1165 Type 2 diabetes mellitus with hyperglycemia: Secondary | ICD-10-CM | POA: Diagnosis not present

## 2018-12-06 DIAGNOSIS — J449 Chronic obstructive pulmonary disease, unspecified: Secondary | ICD-10-CM | POA: Diagnosis not present

## 2018-12-06 DIAGNOSIS — J9611 Chronic respiratory failure with hypoxia: Secondary | ICD-10-CM | POA: Diagnosis not present

## 2018-12-06 DIAGNOSIS — E1165 Type 2 diabetes mellitus with hyperglycemia: Secondary | ICD-10-CM | POA: Diagnosis not present

## 2018-12-06 DIAGNOSIS — R531 Weakness: Secondary | ICD-10-CM | POA: Diagnosis not present

## 2018-12-06 DIAGNOSIS — M159 Polyosteoarthritis, unspecified: Secondary | ICD-10-CM | POA: Diagnosis not present

## 2018-12-06 DIAGNOSIS — E1142 Type 2 diabetes mellitus with diabetic polyneuropathy: Secondary | ICD-10-CM | POA: Diagnosis not present

## 2018-12-07 DIAGNOSIS — E1142 Type 2 diabetes mellitus with diabetic polyneuropathy: Secondary | ICD-10-CM | POA: Diagnosis not present

## 2018-12-07 DIAGNOSIS — J9611 Chronic respiratory failure with hypoxia: Secondary | ICD-10-CM | POA: Diagnosis not present

## 2018-12-07 DIAGNOSIS — E1165 Type 2 diabetes mellitus with hyperglycemia: Secondary | ICD-10-CM | POA: Diagnosis not present

## 2018-12-07 DIAGNOSIS — R531 Weakness: Secondary | ICD-10-CM | POA: Diagnosis not present

## 2018-12-07 DIAGNOSIS — J449 Chronic obstructive pulmonary disease, unspecified: Secondary | ICD-10-CM | POA: Diagnosis not present

## 2018-12-07 DIAGNOSIS — M159 Polyosteoarthritis, unspecified: Secondary | ICD-10-CM | POA: Diagnosis not present

## 2018-12-08 DIAGNOSIS — J449 Chronic obstructive pulmonary disease, unspecified: Secondary | ICD-10-CM | POA: Diagnosis not present

## 2018-12-08 DIAGNOSIS — E1165 Type 2 diabetes mellitus with hyperglycemia: Secondary | ICD-10-CM | POA: Diagnosis not present

## 2018-12-08 DIAGNOSIS — R531 Weakness: Secondary | ICD-10-CM | POA: Diagnosis not present

## 2018-12-08 DIAGNOSIS — M159 Polyosteoarthritis, unspecified: Secondary | ICD-10-CM | POA: Diagnosis not present

## 2018-12-08 DIAGNOSIS — E1142 Type 2 diabetes mellitus with diabetic polyneuropathy: Secondary | ICD-10-CM | POA: Diagnosis not present

## 2018-12-08 DIAGNOSIS — J9611 Chronic respiratory failure with hypoxia: Secondary | ICD-10-CM | POA: Diagnosis not present

## 2018-12-10 DIAGNOSIS — M159 Polyosteoarthritis, unspecified: Secondary | ICD-10-CM | POA: Diagnosis not present

## 2018-12-10 DIAGNOSIS — J9611 Chronic respiratory failure with hypoxia: Secondary | ICD-10-CM | POA: Diagnosis not present

## 2018-12-10 DIAGNOSIS — E1165 Type 2 diabetes mellitus with hyperglycemia: Secondary | ICD-10-CM | POA: Diagnosis not present

## 2018-12-10 DIAGNOSIS — J449 Chronic obstructive pulmonary disease, unspecified: Secondary | ICD-10-CM | POA: Diagnosis not present

## 2018-12-10 DIAGNOSIS — E1142 Type 2 diabetes mellitus with diabetic polyneuropathy: Secondary | ICD-10-CM | POA: Diagnosis not present

## 2018-12-10 DIAGNOSIS — R531 Weakness: Secondary | ICD-10-CM | POA: Diagnosis not present

## 2018-12-14 DIAGNOSIS — E1142 Type 2 diabetes mellitus with diabetic polyneuropathy: Secondary | ICD-10-CM | POA: Diagnosis not present

## 2018-12-14 DIAGNOSIS — M159 Polyosteoarthritis, unspecified: Secondary | ICD-10-CM | POA: Diagnosis not present

## 2018-12-14 DIAGNOSIS — R531 Weakness: Secondary | ICD-10-CM | POA: Diagnosis not present

## 2018-12-14 DIAGNOSIS — J9611 Chronic respiratory failure with hypoxia: Secondary | ICD-10-CM | POA: Diagnosis not present

## 2018-12-14 DIAGNOSIS — J449 Chronic obstructive pulmonary disease, unspecified: Secondary | ICD-10-CM | POA: Diagnosis not present

## 2018-12-14 DIAGNOSIS — E1165 Type 2 diabetes mellitus with hyperglycemia: Secondary | ICD-10-CM | POA: Diagnosis not present

## 2018-12-16 DIAGNOSIS — E1165 Type 2 diabetes mellitus with hyperglycemia: Secondary | ICD-10-CM | POA: Diagnosis not present

## 2018-12-16 DIAGNOSIS — J449 Chronic obstructive pulmonary disease, unspecified: Secondary | ICD-10-CM | POA: Diagnosis not present

## 2018-12-16 DIAGNOSIS — E1142 Type 2 diabetes mellitus with diabetic polyneuropathy: Secondary | ICD-10-CM | POA: Diagnosis not present

## 2018-12-16 DIAGNOSIS — J9611 Chronic respiratory failure with hypoxia: Secondary | ICD-10-CM | POA: Diagnosis not present

## 2018-12-16 DIAGNOSIS — M159 Polyosteoarthritis, unspecified: Secondary | ICD-10-CM | POA: Diagnosis not present

## 2018-12-16 DIAGNOSIS — R531 Weakness: Secondary | ICD-10-CM | POA: Diagnosis not present

## 2018-12-17 DIAGNOSIS — E1142 Type 2 diabetes mellitus with diabetic polyneuropathy: Secondary | ICD-10-CM | POA: Diagnosis not present

## 2018-12-17 DIAGNOSIS — R531 Weakness: Secondary | ICD-10-CM | POA: Diagnosis not present

## 2018-12-17 DIAGNOSIS — M159 Polyosteoarthritis, unspecified: Secondary | ICD-10-CM | POA: Diagnosis not present

## 2018-12-17 DIAGNOSIS — E1165 Type 2 diabetes mellitus with hyperglycemia: Secondary | ICD-10-CM | POA: Diagnosis not present

## 2018-12-17 DIAGNOSIS — J449 Chronic obstructive pulmonary disease, unspecified: Secondary | ICD-10-CM | POA: Diagnosis not present

## 2018-12-17 DIAGNOSIS — J9611 Chronic respiratory failure with hypoxia: Secondary | ICD-10-CM | POA: Diagnosis not present

## 2018-12-20 DIAGNOSIS — M159 Polyosteoarthritis, unspecified: Secondary | ICD-10-CM | POA: Diagnosis not present

## 2018-12-20 DIAGNOSIS — J449 Chronic obstructive pulmonary disease, unspecified: Secondary | ICD-10-CM | POA: Diagnosis not present

## 2018-12-20 DIAGNOSIS — E1165 Type 2 diabetes mellitus with hyperglycemia: Secondary | ICD-10-CM | POA: Diagnosis not present

## 2018-12-20 DIAGNOSIS — J9611 Chronic respiratory failure with hypoxia: Secondary | ICD-10-CM | POA: Diagnosis not present

## 2018-12-20 DIAGNOSIS — R531 Weakness: Secondary | ICD-10-CM | POA: Diagnosis not present

## 2018-12-20 DIAGNOSIS — E1142 Type 2 diabetes mellitus with diabetic polyneuropathy: Secondary | ICD-10-CM | POA: Diagnosis not present

## 2018-12-21 DIAGNOSIS — E1165 Type 2 diabetes mellitus with hyperglycemia: Secondary | ICD-10-CM | POA: Diagnosis not present

## 2018-12-21 DIAGNOSIS — J9611 Chronic respiratory failure with hypoxia: Secondary | ICD-10-CM | POA: Diagnosis not present

## 2018-12-21 DIAGNOSIS — R531 Weakness: Secondary | ICD-10-CM | POA: Diagnosis not present

## 2018-12-21 DIAGNOSIS — J449 Chronic obstructive pulmonary disease, unspecified: Secondary | ICD-10-CM | POA: Diagnosis not present

## 2018-12-21 DIAGNOSIS — E1142 Type 2 diabetes mellitus with diabetic polyneuropathy: Secondary | ICD-10-CM | POA: Diagnosis not present

## 2018-12-21 DIAGNOSIS — M159 Polyosteoarthritis, unspecified: Secondary | ICD-10-CM | POA: Diagnosis not present

## 2018-12-22 DIAGNOSIS — J449 Chronic obstructive pulmonary disease, unspecified: Secondary | ICD-10-CM | POA: Diagnosis not present

## 2018-12-22 DIAGNOSIS — E1142 Type 2 diabetes mellitus with diabetic polyneuropathy: Secondary | ICD-10-CM | POA: Diagnosis not present

## 2018-12-22 DIAGNOSIS — R531 Weakness: Secondary | ICD-10-CM | POA: Diagnosis not present

## 2018-12-22 DIAGNOSIS — M159 Polyosteoarthritis, unspecified: Secondary | ICD-10-CM | POA: Diagnosis not present

## 2018-12-22 DIAGNOSIS — J9611 Chronic respiratory failure with hypoxia: Secondary | ICD-10-CM | POA: Diagnosis not present

## 2018-12-22 DIAGNOSIS — E1165 Type 2 diabetes mellitus with hyperglycemia: Secondary | ICD-10-CM | POA: Diagnosis not present

## 2018-12-23 DIAGNOSIS — E1165 Type 2 diabetes mellitus with hyperglycemia: Secondary | ICD-10-CM | POA: Diagnosis not present

## 2018-12-23 DIAGNOSIS — E1142 Type 2 diabetes mellitus with diabetic polyneuropathy: Secondary | ICD-10-CM | POA: Diagnosis not present

## 2018-12-23 DIAGNOSIS — R531 Weakness: Secondary | ICD-10-CM | POA: Diagnosis not present

## 2018-12-23 DIAGNOSIS — J9611 Chronic respiratory failure with hypoxia: Secondary | ICD-10-CM | POA: Diagnosis not present

## 2018-12-23 DIAGNOSIS — J449 Chronic obstructive pulmonary disease, unspecified: Secondary | ICD-10-CM | POA: Diagnosis not present

## 2018-12-23 DIAGNOSIS — M159 Polyosteoarthritis, unspecified: Secondary | ICD-10-CM | POA: Diagnosis not present

## 2018-12-28 DIAGNOSIS — R531 Weakness: Secondary | ICD-10-CM | POA: Diagnosis not present

## 2018-12-28 DIAGNOSIS — E1142 Type 2 diabetes mellitus with diabetic polyneuropathy: Secondary | ICD-10-CM | POA: Diagnosis not present

## 2018-12-28 DIAGNOSIS — M159 Polyosteoarthritis, unspecified: Secondary | ICD-10-CM | POA: Diagnosis not present

## 2018-12-28 DIAGNOSIS — J9611 Chronic respiratory failure with hypoxia: Secondary | ICD-10-CM | POA: Diagnosis not present

## 2018-12-28 DIAGNOSIS — E1165 Type 2 diabetes mellitus with hyperglycemia: Secondary | ICD-10-CM | POA: Diagnosis not present

## 2018-12-28 DIAGNOSIS — J449 Chronic obstructive pulmonary disease, unspecified: Secondary | ICD-10-CM | POA: Diagnosis not present

## 2018-12-29 DIAGNOSIS — Z7984 Long term (current) use of oral hypoglycemic drugs: Secondary | ICD-10-CM | POA: Diagnosis not present

## 2018-12-29 DIAGNOSIS — E1165 Type 2 diabetes mellitus with hyperglycemia: Secondary | ICD-10-CM | POA: Diagnosis not present

## 2018-12-29 DIAGNOSIS — M159 Polyosteoarthritis, unspecified: Secondary | ICD-10-CM | POA: Diagnosis not present

## 2018-12-29 DIAGNOSIS — Z6839 Body mass index (BMI) 39.0-39.9, adult: Secondary | ICD-10-CM | POA: Diagnosis not present

## 2018-12-29 DIAGNOSIS — I10 Essential (primary) hypertension: Secondary | ICD-10-CM | POA: Diagnosis not present

## 2018-12-29 DIAGNOSIS — H5461 Unqualified visual loss, right eye, normal vision left eye: Secondary | ICD-10-CM | POA: Diagnosis not present

## 2018-12-29 DIAGNOSIS — F3340 Major depressive disorder, recurrent, in remission, unspecified: Secondary | ICD-10-CM | POA: Diagnosis not present

## 2018-12-29 DIAGNOSIS — Z853 Personal history of malignant neoplasm of breast: Secondary | ICD-10-CM | POA: Diagnosis not present

## 2018-12-29 DIAGNOSIS — J9611 Chronic respiratory failure with hypoxia: Secondary | ICD-10-CM | POA: Diagnosis not present

## 2018-12-29 DIAGNOSIS — J449 Chronic obstructive pulmonary disease, unspecified: Secondary | ICD-10-CM | POA: Diagnosis not present

## 2018-12-29 DIAGNOSIS — Z7902 Long term (current) use of antithrombotics/antiplatelets: Secondary | ICD-10-CM | POA: Diagnosis not present

## 2018-12-29 DIAGNOSIS — Z79899 Other long term (current) drug therapy: Secondary | ICD-10-CM | POA: Diagnosis not present

## 2018-12-29 DIAGNOSIS — Z9981 Dependence on supplemental oxygen: Secondary | ICD-10-CM | POA: Diagnosis not present

## 2018-12-29 DIAGNOSIS — F0390 Unspecified dementia without behavioral disturbance: Secondary | ICD-10-CM | POA: Diagnosis not present

## 2018-12-29 DIAGNOSIS — R531 Weakness: Secondary | ICD-10-CM | POA: Diagnosis not present

## 2018-12-29 DIAGNOSIS — H4010X0 Unspecified open-angle glaucoma, stage unspecified: Secondary | ICD-10-CM | POA: Diagnosis not present

## 2018-12-29 DIAGNOSIS — E782 Mixed hyperlipidemia: Secondary | ICD-10-CM | POA: Diagnosis not present

## 2018-12-29 DIAGNOSIS — E1142 Type 2 diabetes mellitus with diabetic polyneuropathy: Secondary | ICD-10-CM | POA: Diagnosis not present

## 2018-12-29 DIAGNOSIS — Z8673 Personal history of transient ischemic attack (TIA), and cerebral infarction without residual deficits: Secondary | ICD-10-CM | POA: Diagnosis not present

## 2018-12-30 DIAGNOSIS — R531 Weakness: Secondary | ICD-10-CM | POA: Diagnosis not present

## 2018-12-30 DIAGNOSIS — M159 Polyosteoarthritis, unspecified: Secondary | ICD-10-CM | POA: Diagnosis not present

## 2018-12-30 DIAGNOSIS — E1165 Type 2 diabetes mellitus with hyperglycemia: Secondary | ICD-10-CM | POA: Diagnosis not present

## 2018-12-30 DIAGNOSIS — J9611 Chronic respiratory failure with hypoxia: Secondary | ICD-10-CM | POA: Diagnosis not present

## 2018-12-30 DIAGNOSIS — E1142 Type 2 diabetes mellitus with diabetic polyneuropathy: Secondary | ICD-10-CM | POA: Diagnosis not present

## 2018-12-30 DIAGNOSIS — J449 Chronic obstructive pulmonary disease, unspecified: Secondary | ICD-10-CM | POA: Diagnosis not present

## 2019-01-04 DIAGNOSIS — M159 Polyosteoarthritis, unspecified: Secondary | ICD-10-CM | POA: Diagnosis not present

## 2019-01-04 DIAGNOSIS — E1142 Type 2 diabetes mellitus with diabetic polyneuropathy: Secondary | ICD-10-CM | POA: Diagnosis not present

## 2019-01-04 DIAGNOSIS — J449 Chronic obstructive pulmonary disease, unspecified: Secondary | ICD-10-CM | POA: Diagnosis not present

## 2019-01-04 DIAGNOSIS — J9611 Chronic respiratory failure with hypoxia: Secondary | ICD-10-CM | POA: Diagnosis not present

## 2019-01-04 DIAGNOSIS — E1165 Type 2 diabetes mellitus with hyperglycemia: Secondary | ICD-10-CM | POA: Diagnosis not present

## 2019-01-04 DIAGNOSIS — R531 Weakness: Secondary | ICD-10-CM | POA: Diagnosis not present

## 2019-01-05 DIAGNOSIS — R531 Weakness: Secondary | ICD-10-CM | POA: Diagnosis not present

## 2019-01-05 DIAGNOSIS — M159 Polyosteoarthritis, unspecified: Secondary | ICD-10-CM | POA: Diagnosis not present

## 2019-01-05 DIAGNOSIS — J9611 Chronic respiratory failure with hypoxia: Secondary | ICD-10-CM | POA: Diagnosis not present

## 2019-01-05 DIAGNOSIS — E1165 Type 2 diabetes mellitus with hyperglycemia: Secondary | ICD-10-CM | POA: Diagnosis not present

## 2019-01-05 DIAGNOSIS — J449 Chronic obstructive pulmonary disease, unspecified: Secondary | ICD-10-CM | POA: Diagnosis not present

## 2019-01-05 DIAGNOSIS — E1142 Type 2 diabetes mellitus with diabetic polyneuropathy: Secondary | ICD-10-CM | POA: Diagnosis not present

## 2019-01-06 DIAGNOSIS — E1165 Type 2 diabetes mellitus with hyperglycemia: Secondary | ICD-10-CM | POA: Diagnosis not present

## 2019-01-06 DIAGNOSIS — M159 Polyosteoarthritis, unspecified: Secondary | ICD-10-CM | POA: Diagnosis not present

## 2019-01-06 DIAGNOSIS — E1142 Type 2 diabetes mellitus with diabetic polyneuropathy: Secondary | ICD-10-CM | POA: Diagnosis not present

## 2019-01-06 DIAGNOSIS — J449 Chronic obstructive pulmonary disease, unspecified: Secondary | ICD-10-CM | POA: Diagnosis not present

## 2019-01-06 DIAGNOSIS — J9611 Chronic respiratory failure with hypoxia: Secondary | ICD-10-CM | POA: Diagnosis not present

## 2019-01-06 DIAGNOSIS — R531 Weakness: Secondary | ICD-10-CM | POA: Diagnosis not present

## 2019-01-11 DIAGNOSIS — M159 Polyosteoarthritis, unspecified: Secondary | ICD-10-CM | POA: Diagnosis not present

## 2019-01-11 DIAGNOSIS — E1165 Type 2 diabetes mellitus with hyperglycemia: Secondary | ICD-10-CM | POA: Diagnosis not present

## 2019-01-11 DIAGNOSIS — E1142 Type 2 diabetes mellitus with diabetic polyneuropathy: Secondary | ICD-10-CM | POA: Diagnosis not present

## 2019-01-11 DIAGNOSIS — J9611 Chronic respiratory failure with hypoxia: Secondary | ICD-10-CM | POA: Diagnosis not present

## 2019-01-11 DIAGNOSIS — R531 Weakness: Secondary | ICD-10-CM | POA: Diagnosis not present

## 2019-01-11 DIAGNOSIS — J449 Chronic obstructive pulmonary disease, unspecified: Secondary | ICD-10-CM | POA: Diagnosis not present

## 2019-01-12 DIAGNOSIS — M159 Polyosteoarthritis, unspecified: Secondary | ICD-10-CM | POA: Diagnosis not present

## 2019-01-12 DIAGNOSIS — E1142 Type 2 diabetes mellitus with diabetic polyneuropathy: Secondary | ICD-10-CM | POA: Diagnosis not present

## 2019-01-12 DIAGNOSIS — E1165 Type 2 diabetes mellitus with hyperglycemia: Secondary | ICD-10-CM | POA: Diagnosis not present

## 2019-01-12 DIAGNOSIS — J9611 Chronic respiratory failure with hypoxia: Secondary | ICD-10-CM | POA: Diagnosis not present

## 2019-01-12 DIAGNOSIS — R531 Weakness: Secondary | ICD-10-CM | POA: Diagnosis not present

## 2019-01-12 DIAGNOSIS — J449 Chronic obstructive pulmonary disease, unspecified: Secondary | ICD-10-CM | POA: Diagnosis not present

## 2019-01-13 DIAGNOSIS — J9611 Chronic respiratory failure with hypoxia: Secondary | ICD-10-CM | POA: Diagnosis not present

## 2019-01-13 DIAGNOSIS — E1165 Type 2 diabetes mellitus with hyperglycemia: Secondary | ICD-10-CM | POA: Diagnosis not present

## 2019-01-13 DIAGNOSIS — M159 Polyosteoarthritis, unspecified: Secondary | ICD-10-CM | POA: Diagnosis not present

## 2019-01-13 DIAGNOSIS — E1142 Type 2 diabetes mellitus with diabetic polyneuropathy: Secondary | ICD-10-CM | POA: Diagnosis not present

## 2019-01-13 DIAGNOSIS — J449 Chronic obstructive pulmonary disease, unspecified: Secondary | ICD-10-CM | POA: Diagnosis not present

## 2019-01-13 DIAGNOSIS — R531 Weakness: Secondary | ICD-10-CM | POA: Diagnosis not present

## 2019-01-17 DIAGNOSIS — J449 Chronic obstructive pulmonary disease, unspecified: Secondary | ICD-10-CM | POA: Diagnosis not present

## 2019-01-17 DIAGNOSIS — E1165 Type 2 diabetes mellitus with hyperglycemia: Secondary | ICD-10-CM | POA: Diagnosis not present

## 2019-01-17 DIAGNOSIS — M159 Polyosteoarthritis, unspecified: Secondary | ICD-10-CM | POA: Diagnosis not present

## 2019-01-17 DIAGNOSIS — R531 Weakness: Secondary | ICD-10-CM | POA: Diagnosis not present

## 2019-01-17 DIAGNOSIS — J9611 Chronic respiratory failure with hypoxia: Secondary | ICD-10-CM | POA: Diagnosis not present

## 2019-01-17 DIAGNOSIS — E1142 Type 2 diabetes mellitus with diabetic polyneuropathy: Secondary | ICD-10-CM | POA: Diagnosis not present

## 2019-01-18 DIAGNOSIS — J449 Chronic obstructive pulmonary disease, unspecified: Secondary | ICD-10-CM | POA: Diagnosis not present

## 2019-01-18 DIAGNOSIS — E785 Hyperlipidemia, unspecified: Secondary | ICD-10-CM | POA: Diagnosis not present

## 2019-01-20 DIAGNOSIS — E1142 Type 2 diabetes mellitus with diabetic polyneuropathy: Secondary | ICD-10-CM | POA: Diagnosis not present

## 2019-01-20 DIAGNOSIS — R531 Weakness: Secondary | ICD-10-CM | POA: Diagnosis not present

## 2019-01-20 DIAGNOSIS — J449 Chronic obstructive pulmonary disease, unspecified: Secondary | ICD-10-CM | POA: Diagnosis not present

## 2019-01-20 DIAGNOSIS — E1165 Type 2 diabetes mellitus with hyperglycemia: Secondary | ICD-10-CM | POA: Diagnosis not present

## 2019-01-20 DIAGNOSIS — M159 Polyosteoarthritis, unspecified: Secondary | ICD-10-CM | POA: Diagnosis not present

## 2019-01-20 DIAGNOSIS — J9611 Chronic respiratory failure with hypoxia: Secondary | ICD-10-CM | POA: Diagnosis not present

## 2019-01-21 DIAGNOSIS — J9611 Chronic respiratory failure with hypoxia: Secondary | ICD-10-CM | POA: Diagnosis not present

## 2019-01-21 DIAGNOSIS — E1142 Type 2 diabetes mellitus with diabetic polyneuropathy: Secondary | ICD-10-CM | POA: Diagnosis not present

## 2019-01-21 DIAGNOSIS — E1165 Type 2 diabetes mellitus with hyperglycemia: Secondary | ICD-10-CM | POA: Diagnosis not present

## 2019-01-21 DIAGNOSIS — R531 Weakness: Secondary | ICD-10-CM | POA: Diagnosis not present

## 2019-01-21 DIAGNOSIS — J449 Chronic obstructive pulmonary disease, unspecified: Secondary | ICD-10-CM | POA: Diagnosis not present

## 2019-01-21 DIAGNOSIS — M159 Polyosteoarthritis, unspecified: Secondary | ICD-10-CM | POA: Diagnosis not present

## 2019-01-26 ENCOUNTER — Other Ambulatory Visit: Payer: Self-pay

## 2019-01-26 ENCOUNTER — Encounter: Payer: Self-pay | Admitting: Orthopedic Surgery

## 2019-01-26 ENCOUNTER — Ambulatory Visit (INDEPENDENT_AMBULATORY_CARE_PROVIDER_SITE_OTHER): Payer: Medicare Other | Admitting: Orthopedic Surgery

## 2019-01-26 VITALS — BP 123/54 | HR 59 | Temp 97.1°F | Ht 61.0 in

## 2019-01-26 DIAGNOSIS — M25511 Pain in right shoulder: Secondary | ICD-10-CM | POA: Diagnosis not present

## 2019-01-26 DIAGNOSIS — G8929 Other chronic pain: Secondary | ICD-10-CM | POA: Diagnosis not present

## 2019-01-26 MED ORDER — HYDROCODONE-ACETAMINOPHEN 5-325 MG PO TABS: 1.0000 | ORAL_TABLET | Freq: Four times a day (QID) | ORAL | 0 refills | Status: DC | PRN

## 2019-01-26 NOTE — Progress Notes (Signed)
Follow-up visit for Tiffany Hartman  She has chronic pain in her right shoulder she is managed with chronic opioid therapy with hydrocodone  She says she gets moderate relief  She would like an injection in the right shoulder  She would like a refill for her medication  No complications noted from medication  Injection was given in the right subacromial space with a 19-month follow-up  Encounter Diagnosis  Name Primary?  . Chronic right shoulder pain Yes    Meds ordered this encounter  Medications  . HYDROcodone-acetaminophen (NORCO/VICODIN) 5-325 MG tablet    Sig: Take 1 tablet by mouth every 6 (six) hours as needed for moderate pain.    Dispense:  28 tablet    Refill:  0

## 2019-02-18 DIAGNOSIS — J449 Chronic obstructive pulmonary disease, unspecified: Secondary | ICD-10-CM | POA: Diagnosis not present

## 2019-02-18 DIAGNOSIS — I10 Essential (primary) hypertension: Secondary | ICD-10-CM | POA: Diagnosis not present

## 2019-02-20 ENCOUNTER — Other Ambulatory Visit: Payer: Self-pay

## 2019-02-20 ENCOUNTER — Ambulatory Visit: Payer: Medicare Other | Attending: Internal Medicine

## 2019-02-20 DIAGNOSIS — Z23 Encounter for immunization: Secondary | ICD-10-CM | POA: Insufficient documentation

## 2019-02-20 NOTE — Progress Notes (Signed)
   Covid-19 Vaccination Clinic  Name:  Tiffany Hartman    MRN: [id number] DOB: 1927/09/21  02/20/2019  Tiffany Hartman was observed post Covid-19 immunization for 15 minutes without incidence. She was provided with Vaccine Information Sheet and instruction to access the V-Safe system.   Tiffany Hartman was instructed to call 911 with any severe reactions post vaccine: Marland Kitchen Difficulty breathing  . Swelling of your face and throat  . A fast heartbeat  . A bad rash all over your body  . Dizziness and weakness    Immunizations Administered    Name Date Dose VIS Date Route   Moderna COVID-19 Vaccine 02/20/2019  2:07 PM 0.5 mL 12/07/2018 Intramuscular   Manufacturer: Moderna   Lot: [id number]   [place]:[id number]

## 2019-03-18 DIAGNOSIS — I10 Essential (primary) hypertension: Secondary | ICD-10-CM | POA: Diagnosis not present

## 2019-03-18 DIAGNOSIS — J449 Chronic obstructive pulmonary disease, unspecified: Secondary | ICD-10-CM | POA: Diagnosis not present

## 2019-03-20 ENCOUNTER — Ambulatory Visit: Payer: Medicare Other | Attending: Internal Medicine

## 2019-03-20 DIAGNOSIS — Z23 Encounter for immunization: Secondary | ICD-10-CM

## 2019-03-20 NOTE — Progress Notes (Signed)
   Covid-19 Vaccination Clinic  Name:  Tiffany Hartman    MRN: [id number] DOB: 11/22/1927  03/20/2019  [name] was observed post Covid-19 immunization for 15 minutes without incident. She was provided with Vaccine Information Sheet and instruction to access the V-Safe system.   [name] was instructed to call 911 with any severe reactions post vaccine: Marland Kitchen Difficulty breathing  . Swelling of face and throat  . A fast heartbeat  . A bad rash all over body  . Dizziness and weakness   Immunizations Administered    Name Date Dose VIS Date Route   Moderna COVID-19 Vaccine 03/20/2019  1:06 PM 0.5 mL 12/07/2018 Intramuscular   Manufacturer: Moderna   Lot: [id number]   [place]:[id number]

## 2019-04-26 NOTE — Progress Notes (Signed)
Chief Complaint  Patient presents with  . Joint Pain    both shoulders right leg back and right hip all painful   . Medication Refill    Hydrocodone    Increasing pain back and hip knee rigt leg with worsening pain both shoulders   Tiffany Hartman is 87 years old she has chronic cervical disc disease and chronic lumbar disc disease she has been confined to a wheelchair for most of the last few years she status post bilateral total knees with a periprosthetic fracture she has chronic nonoperable bilateral shoulder cuff arthropathy  She comes in with increasing pain after her Covid vaccinations with total body aching which has persisted even several weeks after the vaccinations  I reviewed her MRI again of her neck  Past Medical History:  Diagnosis Date  . Asthma   . Breast cancer ([place]) 02/20/83   LT mastectomy  . Bronchitis   . Dementia ([place])   . Diabetes mellitus ([place])   . Diverticulitis 2009  . Glaucoma   . HTN (hypertension)   . OA (osteoarthritis)   . Obesity 08/17/2014  . Parkinson disease ([place])   . Stroke ([place])   . Thyroid nodule    Dr Legrand Rams    BP (!) 128/55   Pulse (!) 57   Ht 5\' 1"  (1.549 m)   BMI 43.46 kg/m  The patient meets the AMA guidelines for Morbid (severe) obesity with a BMI > 40.0 and I have recommended weight loss.   She has tenderness around both shoulder joints more than usual.  She has painful range of motion which is limited by pain and chronic contracture with weakness in abduction and flexion bilaterally  She has neck pain on the right side none on the midline  She can move her legs but she is very weak  Recommend subacromial injection right shoulder   Procedure note the subacromial injection shoulder RIGHT    Verbal consent was obtained to inject the  RIGHT   Shoulder  Timeout was completed to confirm the injection site is a subacromial space of the  RIGHT  shoulder   Medication used Depo-Medrol 40 mg and lidocaine 1% 3 cc  Anesthesia was  provided by ethyl chloride  The injection was performed in the RIGHT  posterior subacromial space. After pinning the skin with alcohol and anesthetized the skin with ethyl chloride the subacromial space was injected using a 20-gauge needle. There were no complications  Sterile dressing was applied.    Recommend IM shot to address all of her inflammatory spots  Nurse gave IM shot 40 of Depo-Medrol left deltoid  Encounter Diagnoses  Name Primary?  . Chronic right shoulder pain Yes  . Chronic left shoulder pain   . Acute right-sided low back pain with right-sided sciatica   . Body mass index 40.0-44.9, adult ([place])   . Morbid obesity ([place])     Return 3 months  Meds ordered this encounter  Medications  . HYDROcodone-acetaminophen (NORCO/VICODIN) 5-325 MG tablet    Sig: Take 1 tablet by mouth every 6 (six) hours as needed for moderate pain.    Dispense:  28 tablet    Refill:  0

## 2019-04-27 ENCOUNTER — Encounter: Payer: Self-pay | Admitting: Orthopedic Surgery

## 2019-04-27 ENCOUNTER — Ambulatory Visit (INDEPENDENT_AMBULATORY_CARE_PROVIDER_SITE_OTHER): Payer: Medicare Other | Admitting: Orthopedic Surgery

## 2019-04-27 ENCOUNTER — Other Ambulatory Visit: Payer: Self-pay

## 2019-04-27 VITALS — BP 128/55 | HR 57 | Ht 61.0 in

## 2019-04-27 DIAGNOSIS — M5441 Lumbago with sciatica, right side: Secondary | ICD-10-CM | POA: Diagnosis not present

## 2019-04-27 DIAGNOSIS — Z6841 Body Mass Index (BMI) 40.0 and over, adult: Secondary | ICD-10-CM | POA: Diagnosis not present

## 2019-04-27 DIAGNOSIS — G8929 Other chronic pain: Secondary | ICD-10-CM | POA: Diagnosis not present

## 2019-04-27 DIAGNOSIS — M25512 Pain in left shoulder: Secondary | ICD-10-CM

## 2019-04-27 DIAGNOSIS — M25511 Pain in right shoulder: Secondary | ICD-10-CM | POA: Diagnosis not present

## 2019-04-27 MED ORDER — HYDROCODONE-ACETAMINOPHEN 5-325 MG PO TABS: 1.0000 | ORAL_TABLET | Freq: Four times a day (QID) | ORAL | 0 refills | Status: DC | PRN

## 2019-04-27 NOTE — Patient Instructions (Signed)
Use heating pads as needed hip back and shoulder

## 2019-05-06 DIAGNOSIS — M159 Polyosteoarthritis, unspecified: Secondary | ICD-10-CM | POA: Diagnosis not present

## 2019-05-06 DIAGNOSIS — I10 Essential (primary) hypertension: Secondary | ICD-10-CM | POA: Diagnosis not present

## 2019-05-26 DIAGNOSIS — J9611 Chronic respiratory failure with hypoxia: Secondary | ICD-10-CM | POA: Diagnosis not present

## 2019-05-26 DIAGNOSIS — E1165 Type 2 diabetes mellitus with hyperglycemia: Secondary | ICD-10-CM | POA: Diagnosis not present

## 2019-05-26 DIAGNOSIS — J449 Chronic obstructive pulmonary disease, unspecified: Secondary | ICD-10-CM | POA: Diagnosis not present

## 2019-07-27 ENCOUNTER — Ambulatory Visit (INDEPENDENT_AMBULATORY_CARE_PROVIDER_SITE_OTHER): Payer: Medicare Other | Admitting: Orthopedic Surgery

## 2019-07-27 ENCOUNTER — Other Ambulatory Visit: Payer: Self-pay

## 2019-07-27 ENCOUNTER — Encounter: Payer: Self-pay | Admitting: Orthopedic Surgery

## 2019-07-27 VITALS — BP 137/58 | HR 69 | Ht 61.0 in

## 2019-07-27 DIAGNOSIS — M25511 Pain in right shoulder: Secondary | ICD-10-CM | POA: Diagnosis not present

## 2019-07-27 DIAGNOSIS — I10 Essential (primary) hypertension: Secondary | ICD-10-CM | POA: Diagnosis not present

## 2019-07-27 DIAGNOSIS — G8929 Other chronic pain: Secondary | ICD-10-CM | POA: Diagnosis not present

## 2019-07-27 DIAGNOSIS — Z6841 Body Mass Index (BMI) 40.0 and over, adult: Secondary | ICD-10-CM | POA: Diagnosis not present

## 2019-07-27 DIAGNOSIS — E785 Hyperlipidemia, unspecified: Secondary | ICD-10-CM | POA: Diagnosis not present

## 2019-07-27 MED ORDER — HYDROCODONE-ACETAMINOPHEN 5-325 MG PO TABS: 1.0000 | ORAL_TABLET | Freq: Four times a day (QID) | ORAL | 0 refills | Status: DC | PRN

## 2019-07-27 NOTE — Progress Notes (Signed)
Chief Complaint  Patient presents with  . Shoulder Pain    better /right shoulder feels okay today  . Medication Refill    needs RF hydrocodone     She feels good wants to try the medication   Encounter Diagnoses  Name Primary?  . Body mass index 40.0-44.9, adult ([place])   . Morbid obesity ([place])   . Chronic right shoulder pain Yes   The patient meets the AMA guidelines for Morbid (severe) obesity with a BMI > 40.0 and I have recommended weight loss.  Meds ordered this encounter  Medications  . HYDROcodone-acetaminophen (NORCO/VICODIN) 5-325 MG tablet    Sig: Take 1 tablet by mouth every 6 (six) hours as needed for moderate pain.    Dispense:  28 tablet    Refill:  0    Chronic problem / Rx

## 2019-08-27 DIAGNOSIS — F3340 Major depressive disorder, recurrent, in remission, unspecified: Secondary | ICD-10-CM | POA: Diagnosis not present

## 2019-08-27 DIAGNOSIS — I10 Essential (primary) hypertension: Secondary | ICD-10-CM | POA: Diagnosis not present

## 2019-09-24 DIAGNOSIS — Z23 Encounter for immunization: Secondary | ICD-10-CM | POA: Diagnosis not present

## 2019-09-27 DIAGNOSIS — I10 Essential (primary) hypertension: Secondary | ICD-10-CM | POA: Diagnosis not present

## 2019-09-27 DIAGNOSIS — J449 Chronic obstructive pulmonary disease, unspecified: Secondary | ICD-10-CM | POA: Diagnosis not present

## 2019-10-27 ENCOUNTER — Ambulatory Visit: Payer: Medicare Other

## 2019-10-27 ENCOUNTER — Ambulatory Visit (INDEPENDENT_AMBULATORY_CARE_PROVIDER_SITE_OTHER): Payer: Medicare Other | Admitting: Orthopedic Surgery

## 2019-10-27 ENCOUNTER — Other Ambulatory Visit: Payer: Self-pay

## 2019-10-27 VITALS — BP 135/64 | HR 63 | Ht 61.0 in

## 2019-10-27 DIAGNOSIS — Z6841 Body Mass Index (BMI) 40.0 and over, adult: Secondary | ICD-10-CM

## 2019-10-27 DIAGNOSIS — W19XXXA Unspecified fall, initial encounter: Secondary | ICD-10-CM | POA: Diagnosis not present

## 2019-10-27 DIAGNOSIS — M898X5 Other specified disorders of bone, thigh: Secondary | ICD-10-CM

## 2019-10-27 DIAGNOSIS — M25511 Pain in right shoulder: Secondary | ICD-10-CM | POA: Diagnosis not present

## 2019-10-27 DIAGNOSIS — G8929 Other chronic pain: Secondary | ICD-10-CM | POA: Diagnosis not present

## 2019-10-27 DIAGNOSIS — M25512 Pain in left shoulder: Secondary | ICD-10-CM | POA: Diagnosis not present

## 2019-10-27 DIAGNOSIS — G894 Chronic pain syndrome: Secondary | ICD-10-CM | POA: Diagnosis not present

## 2019-10-27 MED ORDER — HYDROCODONE-ACETAMINOPHEN 5-325 MG PO TABS: 1.0000 | ORAL_TABLET | Freq: Four times a day (QID) | ORAL | 0 refills | Status: DC | PRN

## 2019-10-27 NOTE — Patient Instructions (Signed)

## 2019-10-27 NOTE — Progress Notes (Signed)
Chief Complaint  Patient presents with  . Follow-up    Recheck on right shoulder, right knee    Encounter Diagnoses  Name Primary?  . Body mass index 40.0-44.9, adult ([employer])   . Morbid obesity ([place])   . Chronic right shoulder pain   . Chronic left shoulder pain   . 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 After pinning the skin with alcohol and anesthetized the skin with ethyl chloride the subacromial space was injected using a 20-gauge needle. There were no complications  Sterile dressing was applied.  PLAN  New xrays femur and knee   See report   I dont see any hardware complicatx or new fx  Meds ordered this encounter  Medications  . HYDROcodone-acetaminophen (NORCO/VICODIN) 5-325 MG tablet    Sig: Take 1 tablet by mouth every 6 (six) hours as needed for moderate pain.    Dispense:   28 tablet    Refill:  0    Fu prn for shoulder injections

## 2019-10-28 DIAGNOSIS — E1165 Type 2 diabetes mellitus with hyperglycemia: Secondary | ICD-10-CM | POA: Diagnosis not present

## 2019-10-28 DIAGNOSIS — I10 Essential (primary) hypertension: Secondary | ICD-10-CM | POA: Diagnosis not present

## 2019-11-28 DIAGNOSIS — E041 Nontoxic single thyroid nodule: Secondary | ICD-10-CM | POA: Diagnosis not present

## 2019-11-28 DIAGNOSIS — Z131 Encounter for screening for diabetes mellitus: Secondary | ICD-10-CM | POA: Diagnosis not present

## 2019-11-28 DIAGNOSIS — Z1389 Encounter for screening for other disorder: Secondary | ICD-10-CM | POA: Diagnosis not present

## 2019-11-28 DIAGNOSIS — J449 Chronic obstructive pulmonary disease, unspecified: Secondary | ICD-10-CM | POA: Diagnosis not present

## 2019-11-28 DIAGNOSIS — R300 Dysuria: Secondary | ICD-10-CM | POA: Diagnosis not present

## 2019-11-28 DIAGNOSIS — J9611 Chronic respiratory failure with hypoxia: Secondary | ICD-10-CM | POA: Diagnosis not present

## 2019-11-28 DIAGNOSIS — M159 Polyosteoarthritis, unspecified: Secondary | ICD-10-CM | POA: Diagnosis not present

## 2019-11-28 DIAGNOSIS — F329 Major depressive disorder, single episode, unspecified: Secondary | ICD-10-CM | POA: Diagnosis not present

## 2019-11-28 DIAGNOSIS — I10 Essential (primary) hypertension: Secondary | ICD-10-CM | POA: Diagnosis not present

## 2019-11-28 DIAGNOSIS — G459 Transient cerebral ischemic attack, unspecified: Secondary | ICD-10-CM | POA: Diagnosis not present

## 2019-11-28 DIAGNOSIS — E1165 Type 2 diabetes mellitus with hyperglycemia: Secondary | ICD-10-CM | POA: Diagnosis not present

## 2019-11-28 DIAGNOSIS — Z1331 Encounter for screening for depression: Secondary | ICD-10-CM | POA: Diagnosis not present

## 2019-11-28 DIAGNOSIS — Z0000 Encounter for general adult medical examination without abnormal findings: Secondary | ICD-10-CM | POA: Diagnosis not present

## 2019-11-28 DIAGNOSIS — K839 Disease of biliary tract, unspecified: Secondary | ICD-10-CM | POA: Diagnosis not present

## 2019-11-28 DIAGNOSIS — F0390 Unspecified dementia without behavioral disturbance: Secondary | ICD-10-CM | POA: Diagnosis not present

## 2019-12-12 DIAGNOSIS — M159 Polyosteoarthritis, unspecified: Secondary | ICD-10-CM | POA: Diagnosis not present

## 2019-12-12 DIAGNOSIS — I10 Essential (primary) hypertension: Secondary | ICD-10-CM | POA: Diagnosis not present

## 2019-12-12 DIAGNOSIS — G459 Transient cerebral ischemic attack, unspecified: Secondary | ICD-10-CM | POA: Diagnosis not present

## 2019-12-29 DIAGNOSIS — I10 Essential (primary) hypertension: Secondary | ICD-10-CM | POA: Diagnosis not present

## 2019-12-29 DIAGNOSIS — E1165 Type 2 diabetes mellitus with hyperglycemia: Secondary | ICD-10-CM | POA: Diagnosis not present

## 2020-01-12 ENCOUNTER — Ambulatory Visit: Payer: Medicare Other | Admitting: Orthopedic Surgery

## 2020-01-29 DIAGNOSIS — I10 Essential (primary) hypertension: Secondary | ICD-10-CM | POA: Diagnosis not present

## 2020-01-29 DIAGNOSIS — F0390 Unspecified dementia without behavioral disturbance: Secondary | ICD-10-CM | POA: Diagnosis not present

## 2020-02-14 DIAGNOSIS — J9611 Chronic respiratory failure with hypoxia: Secondary | ICD-10-CM | POA: Diagnosis not present

## 2020-02-14 DIAGNOSIS — J449 Chronic obstructive pulmonary disease, unspecified: Secondary | ICD-10-CM | POA: Diagnosis not present

## 2020-02-14 DIAGNOSIS — N1832 Chronic kidney disease, stage 3b: Secondary | ICD-10-CM | POA: Diagnosis not present

## 2020-02-14 DIAGNOSIS — Z515 Encounter for palliative care: Secondary | ICD-10-CM | POA: Diagnosis not present

## 2020-03-01 DIAGNOSIS — I10 Essential (primary) hypertension: Secondary | ICD-10-CM | POA: Diagnosis not present

## 2020-03-01 DIAGNOSIS — M159 Polyosteoarthritis, unspecified: Secondary | ICD-10-CM | POA: Diagnosis not present

## 2020-03-06 ENCOUNTER — Ambulatory Visit: Payer: Medicare Other | Attending: Critical Care Medicine

## 2020-03-06 DIAGNOSIS — Z23 Encounter for immunization: Secondary | ICD-10-CM

## 2020-03-06 NOTE — Progress Notes (Signed)
   Covid-[facility] Vaccination [facility]  Name:  CAMELLIA POPESCU    MRN: [id number] DOB: [birth year]  03/06/2020  Ms. Delair was observed post Covid-19 immunization for 15 MIN without incident. She was provided with Vaccine Information Sheet and instruction to access the V-Safe system.   Ms. Boehm was instructed to call 911 with any severe reactions post vaccine: Marland Kitchen Difficulty breathing  . Swelling of face and throat  . A fast heartbeat  . A bad rash all over body  . Dizziness and weakness   Immunizations Administered    Name Date Dose VIS Date Route   Moderna Covid-19 Booster Vaccine 03/06/2020 10:20 AM 0.25 mL 10/26/2019 Intramuscular   Manufacturer: Moderna   Lot: [id number]   [place]: [id number]

## 2020-03-13 DIAGNOSIS — J9611 Chronic respiratory failure with hypoxia: Secondary | ICD-10-CM | POA: Diagnosis not present

## 2020-03-13 DIAGNOSIS — N1832 Chronic kidney disease, stage 3b: Secondary | ICD-10-CM | POA: Diagnosis not present

## 2020-03-13 DIAGNOSIS — J449 Chronic obstructive pulmonary disease, unspecified: Secondary | ICD-10-CM | POA: Diagnosis not present

## 2020-03-13 DIAGNOSIS — Z515 Encounter for palliative care: Secondary | ICD-10-CM | POA: Diagnosis not present

## 2020-03-29 DIAGNOSIS — J9611 Chronic respiratory failure with hypoxia: Secondary | ICD-10-CM | POA: Diagnosis not present

## 2020-03-29 DIAGNOSIS — I10 Essential (primary) hypertension: Secondary | ICD-10-CM | POA: Diagnosis not present

## 2020-04-10 DIAGNOSIS — J449 Chronic obstructive pulmonary disease, unspecified: Secondary | ICD-10-CM | POA: Diagnosis not present

## 2020-04-10 DIAGNOSIS — Z515 Encounter for palliative care: Secondary | ICD-10-CM | POA: Diagnosis not present

## 2020-04-10 DIAGNOSIS — N1832 Chronic kidney disease, stage 3b: Secondary | ICD-10-CM | POA: Diagnosis not present

## 2020-04-10 DIAGNOSIS — J9611 Chronic respiratory failure with hypoxia: Secondary | ICD-10-CM | POA: Diagnosis not present

## 2020-04-29 DIAGNOSIS — I10 Essential (primary) hypertension: Secondary | ICD-10-CM | POA: Diagnosis not present

## 2020-04-29 DIAGNOSIS — J449 Chronic obstructive pulmonary disease, unspecified: Secondary | ICD-10-CM | POA: Diagnosis not present

## 2020-05-07 DIAGNOSIS — J449 Chronic obstructive pulmonary disease, unspecified: Secondary | ICD-10-CM | POA: Diagnosis not present

## 2020-05-07 DIAGNOSIS — N1832 Chronic kidney disease, stage 3b: Secondary | ICD-10-CM | POA: Diagnosis not present

## 2020-05-07 DIAGNOSIS — J9611 Chronic respiratory failure with hypoxia: Secondary | ICD-10-CM | POA: Diagnosis not present

## 2020-05-07 DIAGNOSIS — Z515 Encounter for palliative care: Secondary | ICD-10-CM | POA: Diagnosis not present

## 2020-05-28 DIAGNOSIS — N1832 Chronic kidney disease, stage 3b: Secondary | ICD-10-CM | POA: Diagnosis not present

## 2020-05-28 DIAGNOSIS — E1165 Type 2 diabetes mellitus with hyperglycemia: Secondary | ICD-10-CM | POA: Diagnosis not present

## 2020-05-28 DIAGNOSIS — J449 Chronic obstructive pulmonary disease, unspecified: Secondary | ICD-10-CM | POA: Diagnosis not present

## 2020-05-28 DIAGNOSIS — J9611 Chronic respiratory failure with hypoxia: Secondary | ICD-10-CM | POA: Diagnosis not present

## 2020-06-05 DIAGNOSIS — J9611 Chronic respiratory failure with hypoxia: Secondary | ICD-10-CM | POA: Diagnosis not present

## 2020-06-05 DIAGNOSIS — N1832 Chronic kidney disease, stage 3b: Secondary | ICD-10-CM | POA: Diagnosis not present

## 2020-06-05 DIAGNOSIS — Z515 Encounter for palliative care: Secondary | ICD-10-CM | POA: Diagnosis not present

## 2020-06-05 DIAGNOSIS — J449 Chronic obstructive pulmonary disease, unspecified: Secondary | ICD-10-CM | POA: Diagnosis not present

## 2020-06-11 DIAGNOSIS — Z131 Encounter for screening for diabetes mellitus: Secondary | ICD-10-CM | POA: Diagnosis not present

## 2020-06-11 DIAGNOSIS — E041 Nontoxic single thyroid nodule: Secondary | ICD-10-CM | POA: Diagnosis not present

## 2020-06-11 DIAGNOSIS — G459 Transient cerebral ischemic attack, unspecified: Secondary | ICD-10-CM | POA: Diagnosis not present

## 2020-06-11 DIAGNOSIS — Z0000 Encounter for general adult medical examination without abnormal findings: Secondary | ICD-10-CM | POA: Diagnosis not present

## 2020-06-11 DIAGNOSIS — K839 Disease of biliary tract, unspecified: Secondary | ICD-10-CM | POA: Diagnosis not present

## 2020-06-11 DIAGNOSIS — M159 Polyosteoarthritis, unspecified: Secondary | ICD-10-CM | POA: Diagnosis not present

## 2020-06-11 DIAGNOSIS — E1165 Type 2 diabetes mellitus with hyperglycemia: Secondary | ICD-10-CM | POA: Diagnosis not present

## 2020-06-11 DIAGNOSIS — R300 Dysuria: Secondary | ICD-10-CM | POA: Diagnosis not present

## 2020-06-11 DIAGNOSIS — I10 Essential (primary) hypertension: Secondary | ICD-10-CM | POA: Diagnosis not present

## 2020-06-11 DIAGNOSIS — F0390 Unspecified dementia without behavioral disturbance: Secondary | ICD-10-CM | POA: Diagnosis not present

## 2020-06-11 DIAGNOSIS — F329 Major depressive disorder, single episode, unspecified: Secondary | ICD-10-CM | POA: Diagnosis not present

## 2020-06-15 DIAGNOSIS — N1832 Chronic kidney disease, stage 3b: Secondary | ICD-10-CM | POA: Diagnosis not present

## 2020-06-15 DIAGNOSIS — E875 Hyperkalemia: Secondary | ICD-10-CM | POA: Diagnosis not present

## 2020-07-07 DIAGNOSIS — Z20822 Contact with and (suspected) exposure to covid-19: Secondary | ICD-10-CM | POA: Diagnosis not present

## 2020-07-12 DIAGNOSIS — M159 Polyosteoarthritis, unspecified: Secondary | ICD-10-CM | POA: Diagnosis not present

## 2020-07-12 DIAGNOSIS — I10 Essential (primary) hypertension: Secondary | ICD-10-CM | POA: Diagnosis not present

## 2020-07-12 DIAGNOSIS — E1165 Type 2 diabetes mellitus with hyperglycemia: Secondary | ICD-10-CM | POA: Diagnosis not present

## 2020-07-12 DIAGNOSIS — Z131 Encounter for screening for diabetes mellitus: Secondary | ICD-10-CM | POA: Diagnosis not present

## 2020-07-15 DIAGNOSIS — I10 Essential (primary) hypertension: Secondary | ICD-10-CM | POA: Diagnosis not present

## 2020-07-15 DIAGNOSIS — E1165 Type 2 diabetes mellitus with hyperglycemia: Secondary | ICD-10-CM | POA: Diagnosis not present

## 2020-07-26 DIAGNOSIS — Z79899 Other long term (current) drug therapy: Secondary | ICD-10-CM | POA: Diagnosis not present

## 2020-07-26 DIAGNOSIS — E1122 Type 2 diabetes mellitus with diabetic chronic kidney disease: Secondary | ICD-10-CM | POA: Diagnosis not present

## 2020-07-26 DIAGNOSIS — N189 Chronic kidney disease, unspecified: Secondary | ICD-10-CM | POA: Diagnosis not present

## 2020-07-26 DIAGNOSIS — I5033 Acute on chronic diastolic (congestive) heart failure: Secondary | ICD-10-CM | POA: Diagnosis not present

## 2020-07-26 DIAGNOSIS — D631 Anemia in chronic kidney disease: Secondary | ICD-10-CM | POA: Diagnosis not present

## 2020-07-26 DIAGNOSIS — I129 Hypertensive chronic kidney disease with stage 1 through stage 4 chronic kidney disease, or unspecified chronic kidney disease: Secondary | ICD-10-CM | POA: Diagnosis not present

## 2020-07-26 DIAGNOSIS — E870 Hyperosmolality and hypernatremia: Secondary | ICD-10-CM | POA: Diagnosis not present

## 2020-07-26 DIAGNOSIS — E875 Hyperkalemia: Secondary | ICD-10-CM | POA: Diagnosis not present

## 2020-07-27 ENCOUNTER — Other Ambulatory Visit (HOSPITAL_COMMUNITY): Payer: Self-pay | Admitting: Nephrology

## 2020-07-27 DIAGNOSIS — E1122 Type 2 diabetes mellitus with diabetic chronic kidney disease: Secondary | ICD-10-CM

## 2020-07-27 DIAGNOSIS — N189 Chronic kidney disease, unspecified: Secondary | ICD-10-CM

## 2020-07-27 DIAGNOSIS — D631 Anemia in chronic kidney disease: Secondary | ICD-10-CM

## 2020-08-02 ENCOUNTER — Ambulatory Visit (HOSPITAL_COMMUNITY)
Admission: RE | Admit: 2020-08-02 | Discharge: 2020-08-02 | Disposition: A | Discharge status: Home / Self Care | Payer: Medicare Other | Source: Ambulatory Visit | Attending: Nephrology | Admitting: Nephrology

## 2020-08-02 ENCOUNTER — Other Ambulatory Visit: Payer: Self-pay

## 2020-08-02 DIAGNOSIS — N189 Chronic kidney disease, unspecified: Secondary | ICD-10-CM | POA: Insufficient documentation

## 2020-08-02 DIAGNOSIS — D631 Anemia in chronic kidney disease: Secondary | ICD-10-CM

## 2020-08-02 DIAGNOSIS — E1122 Type 2 diabetes mellitus with diabetic chronic kidney disease: Secondary | ICD-10-CM

## 2020-08-02 DIAGNOSIS — N2889 Other specified disorders of kidney and ureter: Secondary | ICD-10-CM | POA: Diagnosis not present

## 2020-08-15 DIAGNOSIS — I10 Essential (primary) hypertension: Secondary | ICD-10-CM | POA: Diagnosis not present

## 2020-08-15 DIAGNOSIS — N1832 Chronic kidney disease, stage 3b: Secondary | ICD-10-CM | POA: Diagnosis not present

## 2020-08-23 DIAGNOSIS — Z131 Encounter for screening for diabetes mellitus: Secondary | ICD-10-CM | POA: Diagnosis not present

## 2020-08-23 DIAGNOSIS — E870 Hyperosmolality and hypernatremia: Secondary | ICD-10-CM | POA: Diagnosis not present

## 2020-08-23 DIAGNOSIS — Z79899 Other long term (current) drug therapy: Secondary | ICD-10-CM | POA: Diagnosis not present

## 2020-08-23 DIAGNOSIS — I5032 Chronic diastolic (congestive) heart failure: Secondary | ICD-10-CM | POA: Diagnosis not present

## 2020-08-23 DIAGNOSIS — D631 Anemia in chronic kidney disease: Secondary | ICD-10-CM | POA: Diagnosis not present

## 2020-08-23 DIAGNOSIS — I129 Hypertensive chronic kidney disease with stage 1 through stage 4 chronic kidney disease, or unspecified chronic kidney disease: Secondary | ICD-10-CM | POA: Diagnosis not present

## 2020-08-23 DIAGNOSIS — E1122 Type 2 diabetes mellitus with diabetic chronic kidney disease: Secondary | ICD-10-CM | POA: Diagnosis not present

## 2020-08-23 DIAGNOSIS — N189 Chronic kidney disease, unspecified: Secondary | ICD-10-CM | POA: Diagnosis not present

## 2020-08-23 DIAGNOSIS — E875 Hyperkalemia: Secondary | ICD-10-CM | POA: Diagnosis not present

## 2020-08-23 DIAGNOSIS — E559 Vitamin D deficiency, unspecified: Secondary | ICD-10-CM | POA: Diagnosis not present

## 2020-09-06 DIAGNOSIS — D631 Anemia in chronic kidney disease: Secondary | ICD-10-CM | POA: Diagnosis not present

## 2020-09-06 DIAGNOSIS — E1129 Type 2 diabetes mellitus with other diabetic kidney complication: Secondary | ICD-10-CM | POA: Diagnosis not present

## 2020-09-06 DIAGNOSIS — N189 Chronic kidney disease, unspecified: Secondary | ICD-10-CM | POA: Diagnosis not present

## 2020-09-06 DIAGNOSIS — E211 Secondary hyperparathyroidism, not elsewhere classified: Secondary | ICD-10-CM | POA: Diagnosis not present

## 2020-09-06 DIAGNOSIS — D472 Monoclonal gammopathy: Secondary | ICD-10-CM | POA: Diagnosis not present

## 2020-09-06 DIAGNOSIS — I129 Hypertensive chronic kidney disease with stage 1 through stage 4 chronic kidney disease, or unspecified chronic kidney disease: Secondary | ICD-10-CM | POA: Diagnosis not present

## 2020-09-06 DIAGNOSIS — R809 Proteinuria, unspecified: Secondary | ICD-10-CM | POA: Diagnosis not present

## 2020-09-06 DIAGNOSIS — E1122 Type 2 diabetes mellitus with diabetic chronic kidney disease: Secondary | ICD-10-CM | POA: Diagnosis not present

## 2020-09-06 DIAGNOSIS — I5033 Acute on chronic diastolic (congestive) heart failure: Secondary | ICD-10-CM | POA: Diagnosis not present

## 2020-09-15 DIAGNOSIS — I10 Essential (primary) hypertension: Secondary | ICD-10-CM | POA: Diagnosis not present

## 2020-09-15 DIAGNOSIS — E1165 Type 2 diabetes mellitus with hyperglycemia: Secondary | ICD-10-CM | POA: Diagnosis not present

## 2020-10-15 DIAGNOSIS — I10 Essential (primary) hypertension: Secondary | ICD-10-CM | POA: Diagnosis not present

## 2020-10-15 DIAGNOSIS — N1832 Chronic kidney disease, stage 3b: Secondary | ICD-10-CM | POA: Diagnosis not present

## 2020-11-05 DIAGNOSIS — Z23 Encounter for immunization: Secondary | ICD-10-CM | POA: Diagnosis not present

## 2020-11-16 DIAGNOSIS — I10 Essential (primary) hypertension: Secondary | ICD-10-CM | POA: Diagnosis not present

## 2020-11-16 DIAGNOSIS — J449 Chronic obstructive pulmonary disease, unspecified: Secondary | ICD-10-CM | POA: Diagnosis not present

## 2020-11-16 DIAGNOSIS — Z1389 Encounter for screening for other disorder: Secondary | ICD-10-CM | POA: Diagnosis not present

## 2020-11-16 DIAGNOSIS — J9611 Chronic respiratory failure with hypoxia: Secondary | ICD-10-CM | POA: Diagnosis not present

## 2020-11-16 DIAGNOSIS — E1165 Type 2 diabetes mellitus with hyperglycemia: Secondary | ICD-10-CM | POA: Diagnosis not present

## 2020-11-16 DIAGNOSIS — Z1331 Encounter for screening for depression: Secondary | ICD-10-CM | POA: Diagnosis not present

## 2020-12-05 DIAGNOSIS — D472 Monoclonal gammopathy: Secondary | ICD-10-CM | POA: Diagnosis not present

## 2020-12-05 DIAGNOSIS — I10 Essential (primary) hypertension: Secondary | ICD-10-CM | POA: Diagnosis not present

## 2020-12-05 DIAGNOSIS — N1832 Chronic kidney disease, stage 3b: Secondary | ICD-10-CM | POA: Diagnosis not present

## 2020-12-05 DIAGNOSIS — I5033 Acute on chronic diastolic (congestive) heart failure: Secondary | ICD-10-CM | POA: Diagnosis not present

## 2020-12-05 DIAGNOSIS — D631 Anemia in chronic kidney disease: Secondary | ICD-10-CM | POA: Diagnosis not present

## 2020-12-05 DIAGNOSIS — E211 Secondary hyperparathyroidism, not elsewhere classified: Secondary | ICD-10-CM | POA: Diagnosis not present

## 2020-12-05 DIAGNOSIS — I129 Hypertensive chronic kidney disease with stage 1 through stage 4 chronic kidney disease, or unspecified chronic kidney disease: Secondary | ICD-10-CM | POA: Diagnosis not present

## 2020-12-05 DIAGNOSIS — R809 Proteinuria, unspecified: Secondary | ICD-10-CM | POA: Diagnosis not present

## 2020-12-05 DIAGNOSIS — E1165 Type 2 diabetes mellitus with hyperglycemia: Secondary | ICD-10-CM | POA: Diagnosis not present

## 2020-12-05 DIAGNOSIS — E1129 Type 2 diabetes mellitus with other diabetic kidney complication: Secondary | ICD-10-CM | POA: Diagnosis not present

## 2020-12-05 DIAGNOSIS — E1122 Type 2 diabetes mellitus with diabetic chronic kidney disease: Secondary | ICD-10-CM | POA: Diagnosis not present

## 2020-12-05 DIAGNOSIS — Z0001 Encounter for general adult medical examination with abnormal findings: Secondary | ICD-10-CM | POA: Diagnosis not present

## 2020-12-05 DIAGNOSIS — N189 Chronic kidney disease, unspecified: Secondary | ICD-10-CM | POA: Diagnosis not present

## 2020-12-05 DIAGNOSIS — E785 Hyperlipidemia, unspecified: Secondary | ICD-10-CM | POA: Diagnosis not present

## 2020-12-07 DIAGNOSIS — R809 Proteinuria, unspecified: Secondary | ICD-10-CM | POA: Diagnosis not present

## 2020-12-07 DIAGNOSIS — E1122 Type 2 diabetes mellitus with diabetic chronic kidney disease: Secondary | ICD-10-CM | POA: Diagnosis not present

## 2020-12-07 DIAGNOSIS — E211 Secondary hyperparathyroidism, not elsewhere classified: Secondary | ICD-10-CM | POA: Diagnosis not present

## 2020-12-07 DIAGNOSIS — Z0001 Encounter for general adult medical examination with abnormal findings: Secondary | ICD-10-CM | POA: Diagnosis not present

## 2020-12-07 DIAGNOSIS — E1129 Type 2 diabetes mellitus with other diabetic kidney complication: Secondary | ICD-10-CM | POA: Diagnosis not present

## 2020-12-07 DIAGNOSIS — I5033 Acute on chronic diastolic (congestive) heart failure: Secondary | ICD-10-CM | POA: Diagnosis not present

## 2020-12-07 DIAGNOSIS — N189 Chronic kidney disease, unspecified: Secondary | ICD-10-CM | POA: Diagnosis not present

## 2020-12-07 DIAGNOSIS — D472 Monoclonal gammopathy: Secondary | ICD-10-CM | POA: Diagnosis not present

## 2020-12-07 DIAGNOSIS — E1165 Type 2 diabetes mellitus with hyperglycemia: Secondary | ICD-10-CM | POA: Diagnosis not present

## 2020-12-07 DIAGNOSIS — I10 Essential (primary) hypertension: Secondary | ICD-10-CM | POA: Diagnosis not present

## 2020-12-07 DIAGNOSIS — I129 Hypertensive chronic kidney disease with stage 1 through stage 4 chronic kidney disease, or unspecified chronic kidney disease: Secondary | ICD-10-CM | POA: Diagnosis not present

## 2020-12-07 DIAGNOSIS — E785 Hyperlipidemia, unspecified: Secondary | ICD-10-CM | POA: Diagnosis not present

## 2020-12-07 DIAGNOSIS — D631 Anemia in chronic kidney disease: Secondary | ICD-10-CM | POA: Diagnosis not present

## 2020-12-07 DIAGNOSIS — N1832 Chronic kidney disease, stage 3b: Secondary | ICD-10-CM | POA: Diagnosis not present

## 2020-12-12 DIAGNOSIS — R809 Proteinuria, unspecified: Secondary | ICD-10-CM | POA: Diagnosis not present

## 2020-12-12 DIAGNOSIS — E1129 Type 2 diabetes mellitus with other diabetic kidney complication: Secondary | ICD-10-CM | POA: Diagnosis not present

## 2020-12-12 DIAGNOSIS — E1122 Type 2 diabetes mellitus with diabetic chronic kidney disease: Secondary | ICD-10-CM | POA: Diagnosis not present

## 2020-12-12 DIAGNOSIS — I129 Hypertensive chronic kidney disease with stage 1 through stage 4 chronic kidney disease, or unspecified chronic kidney disease: Secondary | ICD-10-CM | POA: Diagnosis not present

## 2020-12-12 DIAGNOSIS — N189 Chronic kidney disease, unspecified: Secondary | ICD-10-CM | POA: Diagnosis not present

## 2020-12-12 DIAGNOSIS — E211 Secondary hyperparathyroidism, not elsewhere classified: Secondary | ICD-10-CM | POA: Diagnosis not present

## 2020-12-12 DIAGNOSIS — D631 Anemia in chronic kidney disease: Secondary | ICD-10-CM | POA: Diagnosis not present

## 2020-12-16 DIAGNOSIS — I10 Essential (primary) hypertension: Secondary | ICD-10-CM | POA: Diagnosis not present

## 2020-12-16 DIAGNOSIS — E1165 Type 2 diabetes mellitus with hyperglycemia: Secondary | ICD-10-CM | POA: Diagnosis not present

## 2021-01-16 DIAGNOSIS — I10 Essential (primary) hypertension: Secondary | ICD-10-CM | POA: Diagnosis not present

## 2021-01-16 DIAGNOSIS — J449 Chronic obstructive pulmonary disease, unspecified: Secondary | ICD-10-CM | POA: Diagnosis not present

## 2021-02-16 DIAGNOSIS — E1165 Type 2 diabetes mellitus with hyperglycemia: Secondary | ICD-10-CM | POA: Diagnosis not present

## 2021-02-16 DIAGNOSIS — I10 Essential (primary) hypertension: Secondary | ICD-10-CM | POA: Diagnosis not present

## 2021-03-16 DIAGNOSIS — I10 Essential (primary) hypertension: Secondary | ICD-10-CM | POA: Diagnosis not present

## 2021-03-16 DIAGNOSIS — J449 Chronic obstructive pulmonary disease, unspecified: Secondary | ICD-10-CM | POA: Diagnosis not present

## 2021-04-16 DIAGNOSIS — I10 Essential (primary) hypertension: Secondary | ICD-10-CM | POA: Diagnosis not present

## 2021-04-16 DIAGNOSIS — E1165 Type 2 diabetes mellitus with hyperglycemia: Secondary | ICD-10-CM | POA: Diagnosis not present

## 2021-05-04 ENCOUNTER — Emergency Department (HOSPITAL_COMMUNITY): Payer: Medicare Other

## 2021-05-04 ENCOUNTER — Other Ambulatory Visit: Payer: Self-pay

## 2021-05-04 ENCOUNTER — Encounter (HOSPITAL_COMMUNITY): Payer: Self-pay | Admitting: Emergency Medicine

## 2021-05-04 ENCOUNTER — Emergency Department (HOSPITAL_COMMUNITY)
Admission: EM | Admit: 2021-05-04 | Discharge: 2021-05-05 | Disposition: A | Discharge status: Home / Self Care | Payer: Medicare Other | Attending: Emergency Medicine | Admitting: Emergency Medicine

## 2021-05-04 DIAGNOSIS — Z79899 Other long term (current) drug therapy: Secondary | ICD-10-CM | POA: Diagnosis not present

## 2021-05-04 DIAGNOSIS — S8001XA Contusion of right knee, initial encounter: Secondary | ICD-10-CM | POA: Insufficient documentation

## 2021-05-04 DIAGNOSIS — W19XXXA Unspecified fall, initial encounter: Secondary | ICD-10-CM | POA: Diagnosis not present

## 2021-05-04 DIAGNOSIS — Z7984 Long term (current) use of oral hypoglycemic drugs: Secondary | ICD-10-CM | POA: Diagnosis not present

## 2021-05-04 DIAGNOSIS — S4991XA Unspecified injury of right shoulder and upper arm, initial encounter: Secondary | ICD-10-CM | POA: Diagnosis present

## 2021-05-04 DIAGNOSIS — Z96651 Presence of right artificial knee joint: Secondary | ICD-10-CM | POA: Diagnosis not present

## 2021-05-04 DIAGNOSIS — R52 Pain, unspecified: Secondary | ICD-10-CM | POA: Diagnosis not present

## 2021-05-04 DIAGNOSIS — I959 Hypotension, unspecified: Secondary | ICD-10-CM | POA: Diagnosis not present

## 2021-05-04 DIAGNOSIS — D649 Anemia, unspecified: Secondary | ICD-10-CM | POA: Insufficient documentation

## 2021-05-04 DIAGNOSIS — M852 Hyperostosis of skull: Secondary | ICD-10-CM | POA: Diagnosis not present

## 2021-05-04 DIAGNOSIS — E041 Nontoxic single thyroid nodule: Secondary | ICD-10-CM | POA: Diagnosis not present

## 2021-05-04 DIAGNOSIS — I672 Cerebral atherosclerosis: Secondary | ICD-10-CM | POA: Diagnosis not present

## 2021-05-04 DIAGNOSIS — R262 Difficulty in walking, not elsewhere classified: Secondary | ICD-10-CM | POA: Insufficient documentation

## 2021-05-04 DIAGNOSIS — M25561 Pain in right knee: Secondary | ICD-10-CM | POA: Diagnosis not present

## 2021-05-04 DIAGNOSIS — E119 Type 2 diabetes mellitus without complications: Secondary | ICD-10-CM | POA: Insufficient documentation

## 2021-05-04 DIAGNOSIS — M5030 Other cervical disc degeneration, unspecified cervical region: Secondary | ICD-10-CM | POA: Diagnosis not present

## 2021-05-04 DIAGNOSIS — S0990XA Unspecified injury of head, initial encounter: Secondary | ICD-10-CM | POA: Diagnosis not present

## 2021-05-04 DIAGNOSIS — I6523 Occlusion and stenosis of bilateral carotid arteries: Secondary | ICD-10-CM | POA: Diagnosis not present

## 2021-05-04 DIAGNOSIS — S40011A Contusion of right shoulder, initial encounter: Secondary | ICD-10-CM | POA: Insufficient documentation

## 2021-05-04 DIAGNOSIS — M25511 Pain in right shoulder: Secondary | ICD-10-CM | POA: Diagnosis not present

## 2021-05-04 DIAGNOSIS — S199XXA Unspecified injury of neck, initial encounter: Secondary | ICD-10-CM | POA: Diagnosis not present

## 2021-05-04 DIAGNOSIS — M25551 Pain in right hip: Secondary | ICD-10-CM | POA: Diagnosis not present

## 2021-05-04 DIAGNOSIS — I10 Essential (primary) hypertension: Secondary | ICD-10-CM | POA: Diagnosis not present

## 2021-05-04 DIAGNOSIS — M79604 Pain in right leg: Secondary | ICD-10-CM | POA: Diagnosis not present

## 2021-05-04 LAB — CBC WITH DIFFERENTIAL/PLATELET
Abs Immature Granulocytes: 0.03 10*3/uL (ref 0.00–0.07)
Basophils Absolute: 0.1 10*3/uL (ref 0.0–0.1)
Basophils Relative: 1 %
Eosinophils Absolute: 0.2 10*3/uL (ref 0.0–0.5)
Eosinophils Relative: 2 %
HCT: 34.5 % — ABNORMAL LOW (ref 36.0–46.0)
Hemoglobin: 10 g/dL — ABNORMAL LOW (ref 12.0–15.0)
Immature Granulocytes: 0 %
Lymphocytes Relative: 18 %
Lymphs Abs: 1.7 10*3/uL (ref 0.7–4.0)
MCH: 29.6 pg (ref 26.0–34.0)
MCHC: 29 g/dL — ABNORMAL LOW (ref 30.0–36.0)
MCV: 102.1 fL — ABNORMAL HIGH (ref 80.0–100.0)
Monocytes Absolute: 0.7 10*3/uL (ref 0.1–1.0)
Monocytes Relative: 8 %
Neutro Abs: 6.8 10*3/uL (ref 1.7–7.7)
Neutrophils Relative %: 71 %
Platelets: 385 10*3/uL (ref 150–400)
RBC: 3.38 MIL/uL — ABNORMAL LOW (ref 3.87–5.11)
RDW: 13.2 % (ref 11.5–15.5)
WBC: 9.6 10*3/uL (ref 4.0–10.5)
nRBC: 0 % (ref 0.0–0.2)

## 2021-05-04 LAB — URINALYSIS, ROUTINE W REFLEX MICROSCOPIC
Bacteria, UA: NONE SEEN
Bilirubin Urine: NEGATIVE
Glucose, UA: NEGATIVE mg/dL
Hgb urine dipstick: NEGATIVE
Ketones, ur: NEGATIVE mg/dL
Nitrite: NEGATIVE
Protein, ur: NEGATIVE mg/dL
Specific Gravity, Urine: 1.016 (ref 1.005–1.030)
pH: 5 (ref 5.0–8.0)

## 2021-05-04 LAB — COMPREHENSIVE METABOLIC PANEL
ALT: 10 U/L (ref 0–44)
AST: 11 U/L — ABNORMAL LOW (ref 15–41)
Albumin: 3.2 g/dL — ABNORMAL LOW (ref 3.5–5.0)
Alkaline Phosphatase: 49 U/L (ref 38–126)
Anion gap: 5 (ref 5–15)
BUN: 17 mg/dL (ref 8–23)
CO2: 34 mmol/L — ABNORMAL HIGH (ref 22–32)
Calcium: 9 mg/dL (ref 8.9–10.3)
Chloride: 106 mmol/L (ref 98–111)
Creatinine, Ser: 1.24 mg/dL — ABNORMAL HIGH (ref 0.44–1.00)
GFR, Estimated: 41 mL/min — ABNORMAL LOW (ref >60)
Glucose, Bld: 103 mg/dL — ABNORMAL HIGH (ref 70–99)
Potassium: 3.9 mmol/L (ref 3.5–5.1)
Sodium: 145 mmol/L (ref 135–145)
Total Bilirubin: 0.4 mg/dL (ref 0.3–1.2)
Total Protein: 6.1 g/dL — ABNORMAL LOW (ref 6.5–8.1)

## 2021-05-04 MED ORDER — OXYCODONE-ACETAMINOPHEN 5-325 MG PO TABS: ORAL_TABLET | ORAL | 0 refills | Status: DC

## 2021-05-04 NOTE — Discharge Instructions (Signed)
Follow-up with your family doctor next week or you can follow-up with Dr. Aline Brochure for your knee ?

## 2021-05-04 NOTE — ED Notes (Signed)
Transported to CT 

## 2021-05-04 NOTE — ED Notes (Signed)
Pt req socks placed sock on pt ?

## 2021-05-04 NOTE — ED Notes (Signed)
Patient awaiting EMS transport home.  ?

## 2021-05-04 NOTE — ED Notes (Signed)
EDP in room, pt unable to ambulate gait unsteady, Ace wrap applied to right knee. Pt currently on oxygen @ 2 liters via [place] Notified [facility] to transport patient home. ?

## 2021-05-04 NOTE — ED Provider Notes (Signed)
?[facility] ?Provider Note ? ? ?CSN: [id number] ?Arrival date & time: 05/04/21  2683 ? ?  ? ?History ? ?Chief Complaint  ?Patient presents with  ? Fall  ? ? ?Tiffany Hartman is a 88 y.o. female. ? ?Patient fell and hurt her right shoulder and right knee.  She has a history of hypertension and diabetes ? ?The history is provided by the patient and medical records. A language interpreter was used.  ?Fall ?This is a new problem. The current episode started less than 1 hour ago. The problem occurs rarely. Pertinent negatives include no chest pain, no abdominal pain and no headaches. Nothing aggravates the symptoms.  ? ?  ? ?Home Medications ?Prior to Admission medications   ?Medication Sig Start Date End Date Taking? Authorizing Provider  ?albuterol (PROVENTIL HFA;VENTOLIN HFA) 108 (90 Base) MCG/ACT inhaler Inhale 2 puffs into the lungs every 6 (six) hours as needed for wheezing or shortness of breath. 03/01/18  Yes Gerlene Fee, NP  ?ALPRAZolam (XANAX) 0.25 MG tablet Take 1 tablet (0.25 mg total) by mouth 3 (three) times daily as needed for anxiety or sleep. 03/01/18  Yes Gerlene Fee, NP  ?amLODipine (NORVASC) 5 MG tablet Take 1 tablet (5 mg total) by mouth daily. 03/01/18  Yes Gerlene Fee, NP  ?Calcium Carbonate-Vitamin D 600-200 MG-UNIT TABS Take 1 tablet by mouth every evening.    Yes [provider]  ?clopidogrel (PLAVIX) 75 MG tablet Take 1 tablet (75 mg total) by mouth daily with breakfast. 03/01/18  Yes Gerlene Fee, NP  ?docusate sodium (COLACE) 100 MG capsule Take 100 mg by mouth 2 (two) times daily as needed for mild constipation. 02/23/18  Yes [provider]  ?donepezil (ARICEPT) 10 MG tablet Take 1 tablet (10 mg total) by mouth at bedtime. 03/01/18  Yes Gerlene Fee, NP  ?ferrous gluconate (FERGON) 324 MG tablet Take 1 tablet (324 mg total) by mouth daily with breakfast. 03/01/18  Yes Gerlene Fee, NP  ?FLUoxetine (PROZAC) 20 MG capsule Take 1 capsule  (20 mg total) by mouth daily. 03/01/18  Yes Gerlene Fee, NP  ?folic acid (FOLVITE) 1 MG tablet Take 1 tablet (1 mg total) by mouth daily. 03/01/18  Yes Gerlene Fee, NP  ?furosemide (LASIX) 20 MG tablet Take 20 mg by mouth daily.   Yes [provider]  ?gabapentin (NEURONTIN) 300 MG capsule Take 1 capsule (300 mg total) by mouth 3 (three) times daily. 03/01/18  Yes Gerlene Fee, NP  ?glipiZIDE (GLUCOTROL XL) 2.5 MG 24 hr tablet Take 2.5 mg by mouth daily. 03/15/19  Yes [provider]  ?HYDROcodone-acetaminophen (NORCO/VICODIN) 5-325 MG tablet Take 1 tablet by mouth every 6 (six) hours as needed for moderate pain. 10/27/19  Yes Carole Civil, MD  ?ipratropium-albuterol (DUONEB) 0.5-2.5 (3) MG/3ML SOLN Take 3 mLs by nebulization 2 (two) times daily. And every 6 hours as needed 03/01/18  Yes Gerlene Fee, NP  ?KLOR-CON M20 20 MEQ tablet Take 20 mEq by mouth daily. 04/06/19  Yes [provider]  ?latanoprost (XALATAN) 0.005 % ophthalmic solution Place 1 drop into both eyes at bedtime. 03/01/18  Yes Gerlene Fee, NP  ?levETIRAcetam (KEPPRA) 250 MG tablet Take 1 tablet (250 mg total) by mouth 2 (two) times daily. 03/01/18  Yes Gerlene Fee, NP  ?losartan (COZAAR) 25 MG tablet Take 50 mg by mouth daily. 02/01/19  Yes [provider]  ?lovastatin (MEVACOR) 10 MG tablet  Take 1 tablet (10 mg total) by mouth at bedtime. 03/01/18  Yes Gerlene Fee, NP  ?omeprazole (PRILOSEC) 40 MG capsule Take 40 mg by mouth daily. 04/25/19  Yes [provider]  ?oxyCODONE-acetaminophen (PERCOCET) 5-325 MG tablet Take 1 pain medicine every 6 hours for pain not relieved by Tylenol or Motrin 05/04/21  Yes Milton Ferguson, MD  ?polyethylene glycol (MIRALAX / GLYCOLAX) packet Take 17 g by mouth daily as needed for mild constipation. 02/23/18  Yes [provider]  ?timolol (BETIMOL) 0.5 % ophthalmic solution Place 1 drop into the left eye daily. 03/01/18  Yes Gerlene Fee,  NP  ?NON FORMULARY Diet Type:  NAS 02/16/18   [provider]  ?OXYGEN Inhale 2 L/min into the lungs continuous. Titrate as needed to maintain satuation > 90% 02/16/18   [provider]  ?   ? ?Allergies    ?Patient has no known allergies.   ? ?Review of Systems   ?Review of Systems  ?Constitutional:  Negative for appetite change and fatigue.  ?HENT:  Negative for congestion, ear discharge and sinus pressure.   ?Eyes:  Negative for discharge.  ?Respiratory:  Negative for cough.   ?Cardiovascular:  Negative for chest pain.  ?Gastrointestinal:  Negative for abdominal pain and diarrhea.  ?Genitourinary:  Negative for frequency and hematuria.  ?Musculoskeletal:  Negative for back pain.  ?     Pain in right shoulder and right knee  ?Skin:  Negative for rash.  ?Neurological:  Negative for seizures and headaches.  ?Psychiatric/Behavioral:  Negative for hallucinations.   ? ?Physical Exam ?Updated Vital Signs ?BP (!) 148/76   Pulse 78   Temp 98 ?F (36.7 ?C) (Oral)   Resp (!) 24   SpO2 (!) 89%  ?Physical Exam ?Vitals and nursing note reviewed.  ?Constitutional:   ?   Appearance: She is well-developed.  ?HENT:  ?   Head: Normocephalic.  ?   Mouth/Throat:  ?   Mouth: Mucous membranes are moist.  ?Eyes:  ?   General: No scleral icterus. ?   Conjunctiva/sclera: Conjunctivae normal.  ?Neck:  ?   Thyroid: No thyromegaly.  ?Cardiovascular:  ?   Rate and Rhythm: Normal rate and regular rhythm.  ?   Heart sounds: No murmur heard. ?  No friction rub. No gallop.  ?Pulmonary:  ?   Breath sounds: No stridor. No wheezing or rales.  ?Chest:  ?   Chest wall: No tenderness.  ?Abdominal:  ?   General: There is no distension.  ?   Tenderness: There is no abdominal tenderness. There is no rebound.  ?Musculoskeletal:  ?   Cervical back: Neck supple.  ?   Comments: Mild tenderness to right shoulder and right knee  ?Lymphadenopathy:  ?   Cervical: No cervical adenopathy.  ?Skin: ?   Findings: No erythema or rash.  ?Neurological:   ?   Mental Status: She is alert and oriented to person, place, and time.  ?   Motor: No abnormal muscle tone.  ?   Coordination: Coordination normal.  ?Psychiatric:     ?   Behavior: Behavior normal.  ? ? ?ED Results / Procedures / Treatments   ?Labs ?(all labs ordered are listed, but only abnormal results are displayed) ?Labs Reviewed  ?CBC WITH DIFFERENTIAL/PLATELET - Abnormal; Notable for the following components:  ?    Result Value  ? RBC 3.38 (*)   ? Hemoglobin 10.0 (*)   ? HCT 34.5 (*)   ? MCV  102.1 (*)   ? MCHC 29.0 (*)   ? All other components within normal limits  ?COMPREHENSIVE METABOLIC PANEL - Abnormal; Notable for the following components:  ? CO2 34 (*)   ? Glucose, Bld 103 (*)   ? Creatinine, Ser 1.24 (*)   ? Total Protein 6.1 (*)   ? Albumin 3.2 (*)   ? AST 11 (*)   ? GFR, Estimated 41 (*)   ? All other components within normal limits  ?URINALYSIS, ROUTINE W REFLEX MICROSCOPIC - Abnormal; Notable for the following components:  ? APPearance HAZY (*)   ? Leukocytes,Ua SMALL (*)   ? All other components within normal limits  ?URINE CULTURE  ? ? ?EKG ?None ? ?Radiology ?DG Shoulder Right ? ?Result Date: 05/04/2021 ?CLINICAL DATA:  Acute RIGHT shoulder pain following fall. EXAM: RIGHT SHOULDER - 2+ VIEW COMPARISON:  05/28/2016 and prior radiographs.  11/13/2017 chest CT. FINDINGS: No acute fracture or dislocation noted. There is increased acromial humeral space which may represent an effusion or inflammatory changes. Moderate to severe degenerative changes at the glenohumeral joint again noted as well as faint calcifications along the humeral head. No suspicious focal bony lesions are present. IMPRESSION: 1. No evidence of acute bony abnormality. 2. Increased acromial humeral space which could represent an effusion or inflammatory changes. 3. Moderate to severe degenerative changes at the glenohumeral joint. Electronically Signed   By: Margarette Canada M.D.   On: 05/04/2021 12:18  ? ?CT Head Wo  Contrast ? ?Result Date: 05/04/2021 ?CLINICAL DATA:  Head trauma, unable to walk EXAM: CT HEAD WITHOUT CONTRAST CT CERVICAL SPINE WITHOUT CONTRAST TECHNIQUE: Multidetector CT imaging of the head and cervical spine was pe

## 2021-05-04 NOTE — ED Triage Notes (Signed)
Pt arrived via [facility] from home with c/o sliding out of bed. R shoulder pain and pain in leg. Pt is typically on 2 L of O2 at home. Per EMS pt is typically able to walk, but since sliding out of bed, she is unable to walk  ?

## 2021-05-04 NOTE — ED Notes (Signed)
Lab obtained blood for blood work  ?

## 2021-05-04 NOTE — ED Notes (Signed)
Spoke with daughterDeneise Lever)  updates given regarding medical condition ?

## 2021-05-05 MED ORDER — FLUOXETINE HCL 20 MG PO CAPS: 20.0000 mg | ORAL_CAPSULE | Freq: Every day | ORAL | Status: DC

## 2021-05-05 MED ORDER — ALBUTEROL SULFATE (2.5 MG/3ML) 0.083% IN NEBU: 2.5000 mg | INHALATION_SOLUTION | Freq: Four times a day (QID) | RESPIRATORY_TRACT | Status: DC | PRN

## 2021-05-05 MED ORDER — FOLIC ACID 1 MG PO TABS: 1.0000 mg | ORAL_TABLET | Freq: Every day | ORAL | Status: DC

## 2021-05-05 MED ORDER — LOSARTAN POTASSIUM 25 MG PO TABS: 50.0000 mg | ORAL_TABLET | Freq: Every day | ORAL | Status: DC

## 2021-05-05 MED ORDER — DONEPEZIL HCL 5 MG PO TABS: 10.0000 mg | ORAL_TABLET | Freq: Every day | ORAL | Status: DC

## 2021-05-05 MED ORDER — PANTOPRAZOLE SODIUM 40 MG PO TBEC: 40.0000 mg | DELAYED_RELEASE_TABLET | Freq: Every day | ORAL | Status: DC

## 2021-05-05 MED ORDER — HYDROCODONE-ACETAMINOPHEN 5-325 MG PO TABS: 1.0000 | ORAL_TABLET | Freq: Four times a day (QID) | ORAL | Status: DC | PRN

## 2021-05-05 MED ORDER — ALBUTEROL SULFATE HFA 108 (90 BASE) MCG/ACT IN AERS: 2.0000 | INHALATION_SPRAY | Freq: Four times a day (QID) | RESPIRATORY_TRACT | Status: DC | PRN

## 2021-05-05 MED ORDER — AMLODIPINE BESYLATE 5 MG PO TABS: 5.0000 mg | ORAL_TABLET | Freq: Every day | ORAL | Status: DC

## 2021-05-05 MED ORDER — LEVETIRACETAM 250 MG PO TABS: 250.0000 mg | ORAL_TABLET | Freq: Two times a day (BID) | ORAL | Status: DC

## 2021-05-05 MED ORDER — POLYETHYLENE GLYCOL 3350 17 G PO PACK: 17.0000 g | PACK | Freq: Every day | ORAL | Status: DC | PRN

## 2021-05-05 MED ORDER — CLOPIDOGREL BISULFATE 75 MG PO TABS: 75.0000 mg | ORAL_TABLET | Freq: Every day | ORAL | Status: DC

## 2021-05-05 MED ORDER — TIMOLOL MALEATE 0.5 % OP SOLN: 1.0000 [drp] | Freq: Every day | OPHTHALMIC | Status: DC

## 2021-05-05 MED ORDER — POTASSIUM CHLORIDE CRYS ER 20 MEQ PO TBCR: 20.0000 meq | EXTENDED_RELEASE_TABLET | Freq: Every day | ORAL | Status: DC

## 2021-05-05 MED ORDER — ALPRAZOLAM 0.5 MG PO TABS: 0.2500 mg | ORAL_TABLET | Freq: Three times a day (TID) | ORAL | Status: DC | PRN

## 2021-05-05 MED ORDER — LATANOPROST 0.005 % OP SOLN: 1.0000 [drp] | Freq: Every day | OPHTHALMIC | Status: DC

## 2021-05-05 MED ORDER — GLIPIZIDE ER 2.5 MG PO TB24
2.5000 mg | ORAL_TABLET | Freq: Every day | ORAL | Status: DC
  Filled 2021-05-05 (×2): qty 1

## 2021-05-05 NOTE — ED Notes (Signed)
Per daughter, [facility] will be coming to bring patients portable oxygen, walker, and clothes to take her home.  ?

## 2021-05-05 NOTE — ED Notes (Signed)
Patient and daughter Tiffany Hartman) verbalized understanding of discharge instructions.  ?

## 2021-05-05 NOTE — ED Notes (Signed)
EMS at bedside to transport patients.  ?

## 2021-05-05 NOTE — ED Notes (Signed)
Pt given breakfast tray

## 2021-05-05 NOTE — ED Notes (Signed)
[facility] is sending transport. Patient soiled upon assessment. This nurse and nurse tech provided full linen change and pericare. Patient declined medication and stated that she would take home medications once she returns home today.  ?

## 2021-05-05 NOTE — ED Notes (Signed)
[facility] called at this time to see about an ETA on transportation back home. Communications is unable to advice at this time. ?

## 2021-05-05 NOTE — ED Notes (Signed)
Patient voiced concerns of not taking home medications. EDP made aware of EMS transport delay and patients concerns.  ?

## 2021-05-05 NOTE — ED Notes (Signed)
[facility] called back sending transportation at this time. ?

## 2021-05-06 LAB — URINE CULTURE

## 2021-05-08 DIAGNOSIS — Z20822 Contact with and (suspected) exposure to covid-19: Secondary | ICD-10-CM | POA: Diagnosis not present

## 2021-05-16 DIAGNOSIS — I10 Essential (primary) hypertension: Secondary | ICD-10-CM | POA: Diagnosis not present

## 2021-05-16 DIAGNOSIS — E1165 Type 2 diabetes mellitus with hyperglycemia: Secondary | ICD-10-CM | POA: Diagnosis not present

## 2021-05-25 ENCOUNTER — Encounter (HOSPITAL_COMMUNITY): Payer: Self-pay | Admitting: Emergency Medicine

## 2021-05-25 ENCOUNTER — Other Ambulatory Visit: Payer: Self-pay

## 2021-05-25 ENCOUNTER — Emergency Department (HOSPITAL_COMMUNITY)
Admission: EM | Admit: 2021-05-25 | Discharge: 2021-05-26 | Disposition: A | Discharge status: Home / Self Care | Payer: Medicare Other | Attending: Emergency Medicine | Admitting: Emergency Medicine

## 2021-05-25 DIAGNOSIS — G20 Parkinson's disease: Secondary | ICD-10-CM | POA: Insufficient documentation

## 2021-05-25 DIAGNOSIS — Z79899 Other long term (current) drug therapy: Secondary | ICD-10-CM | POA: Diagnosis not present

## 2021-05-25 DIAGNOSIS — M160 Bilateral primary osteoarthritis of hip: Secondary | ICD-10-CM | POA: Diagnosis not present

## 2021-05-25 DIAGNOSIS — R0902 Hypoxemia: Secondary | ICD-10-CM | POA: Diagnosis not present

## 2021-05-25 DIAGNOSIS — Z853 Personal history of malignant neoplasm of breast: Secondary | ICD-10-CM | POA: Insufficient documentation

## 2021-05-25 DIAGNOSIS — S0990XA Unspecified injury of head, initial encounter: Secondary | ICD-10-CM | POA: Diagnosis not present

## 2021-05-25 DIAGNOSIS — J45909 Unspecified asthma, uncomplicated: Secondary | ICD-10-CM | POA: Insufficient documentation

## 2021-05-25 DIAGNOSIS — R0689 Other abnormalities of breathing: Secondary | ICD-10-CM | POA: Diagnosis not present

## 2021-05-25 DIAGNOSIS — W19XXXA Unspecified fall, initial encounter: Secondary | ICD-10-CM | POA: Diagnosis not present

## 2021-05-25 DIAGNOSIS — W01198A Fall on same level from slipping, tripping and stumbling with subsequent striking against other object, initial encounter: Secondary | ICD-10-CM | POA: Insufficient documentation

## 2021-05-25 DIAGNOSIS — M25511 Pain in right shoulder: Secondary | ICD-10-CM | POA: Insufficient documentation

## 2021-05-25 DIAGNOSIS — Z7984 Long term (current) use of oral hypoglycemic drugs: Secondary | ICD-10-CM | POA: Insufficient documentation

## 2021-05-25 DIAGNOSIS — R519 Headache, unspecified: Secondary | ICD-10-CM | POA: Insufficient documentation

## 2021-05-25 DIAGNOSIS — I10 Essential (primary) hypertension: Secondary | ICD-10-CM | POA: Diagnosis not present

## 2021-05-25 DIAGNOSIS — Z96653 Presence of artificial knee joint, bilateral: Secondary | ICD-10-CM | POA: Insufficient documentation

## 2021-05-25 DIAGNOSIS — F0390 Unspecified dementia without behavioral disturbance: Secondary | ICD-10-CM | POA: Insufficient documentation

## 2021-05-25 DIAGNOSIS — R4020 Unspecified coma: Secondary | ICD-10-CM | POA: Diagnosis not present

## 2021-05-25 DIAGNOSIS — Z043 Encounter for examination and observation following other accident: Secondary | ICD-10-CM | POA: Diagnosis not present

## 2021-05-25 DIAGNOSIS — E119 Type 2 diabetes mellitus without complications: Secondary | ICD-10-CM | POA: Diagnosis not present

## 2021-05-25 DIAGNOSIS — S199XXA Unspecified injury of neck, initial encounter: Secondary | ICD-10-CM | POA: Diagnosis not present

## 2021-05-25 DIAGNOSIS — I499 Cardiac arrhythmia, unspecified: Secondary | ICD-10-CM | POA: Diagnosis not present

## 2021-05-25 DIAGNOSIS — Y92002 Bathroom of unspecified non-institutional (private) residence single-family (private) house as the place of occurrence of the external cause: Secondary | ICD-10-CM | POA: Insufficient documentation

## 2021-05-25 DIAGNOSIS — E041 Nontoxic single thyroid nodule: Secondary | ICD-10-CM | POA: Diagnosis not present

## 2021-05-25 NOTE — ED Triage Notes (Addendum)
Patient arrived with EMS from home lost her balance and fell in the bathroom this evening , no LOC , reports pain at right shoulder with mild headache , she is taking Plavix.

## 2021-05-26 ENCOUNTER — Emergency Department (HOSPITAL_COMMUNITY): Payer: Medicare Other

## 2021-05-26 DIAGNOSIS — M160 Bilateral primary osteoarthritis of hip: Secondary | ICD-10-CM | POA: Diagnosis not present

## 2021-05-26 DIAGNOSIS — E041 Nontoxic single thyroid nodule: Secondary | ICD-10-CM | POA: Diagnosis not present

## 2021-05-26 DIAGNOSIS — S0990XA Unspecified injury of head, initial encounter: Secondary | ICD-10-CM | POA: Diagnosis not present

## 2021-05-26 DIAGNOSIS — Z743 Need for continuous supervision: Secondary | ICD-10-CM | POA: Diagnosis not present

## 2021-05-26 DIAGNOSIS — M25511 Pain in right shoulder: Secondary | ICD-10-CM | POA: Diagnosis not present

## 2021-05-26 DIAGNOSIS — R279 Unspecified lack of coordination: Secondary | ICD-10-CM | POA: Diagnosis not present

## 2021-05-26 DIAGNOSIS — W19XXXA Unspecified fall, initial encounter: Secondary | ICD-10-CM | POA: Diagnosis not present

## 2021-05-26 DIAGNOSIS — S199XXA Unspecified injury of neck, initial encounter: Secondary | ICD-10-CM | POA: Diagnosis not present

## 2021-05-26 DIAGNOSIS — Z043 Encounter for examination and observation following other accident: Secondary | ICD-10-CM | POA: Diagnosis not present

## 2021-05-26 NOTE — ED Notes (Signed)
Daughter notified on patient's condition and discharge plan.

## 2021-05-26 NOTE — ED Notes (Signed)
[facility] called 

## 2021-05-26 NOTE — Discharge Instructions (Addendum)
It was a pleasure caring for you today in the emergency department. ° °Please return to the emergency department for any worsening or worrisome symptoms. ° ° °

## 2021-05-26 NOTE — ED Provider Notes (Signed)
[facility] EMERGENCY DEPARTMENT Provider Note   CSN: [id number] Arrival date & time: 05/25/21  2329     History  Chief Complaint  Patient presents with   Lytle Michaels    Tiffany Hartman is a 88 y.o. female.  Patient as above with significant medical history as below, including dementia, diabetes, hypertension, Parkinson's, stroke who presents to the ED with complaint of fall.  Patient reports that she fell at home in her bathroom.  She tripped over an object on the ground.  Fell onto her right side.  Does not believe she struck her head on the ground.  She has pain to her right shoulder. Mild HA.   No nausea, vomiting, chest pain, dyspnea.  No acute numbness or tingling . otherwise no significant acute complaints.  History is limited secondary to dementia and Parkinson's disease, prior stroke.    Past Medical History:  Diagnosis Date   Asthma    Breast cancer ([place]) 02/20/83   LT mastectomy   Bronchitis    Dementia ([place])    Diabetes mellitus ([place])    Diverticulitis 2009   Glaucoma    HTN (hypertension)    OA (osteoarthritis)    Obesity 08/17/2014   Parkinson disease ([place])    Stroke [facility])    Thyroid nodule    Dr Legrand Rams    Past Surgical History:  Procedure Laterality Date    bilateral catracts     ABDOMINAL HYSTERECTOMY     complete   APPENDECTOMY     EUS  09/10/2011   Procedure: UPPER ENDOSCOPIC ULTRASOUND (EUS) RADIAL;  Surgeon: Arta Silence, MD;  Location: [facility] ENDOSCOPY;  Service: Endoscopy;  Laterality: N/A;  christina/ebp   FINE NEEDLE ASPIRATION  09/10/2011   Procedure: FINE NEEDLE ASPIRATION (FNA) LINEAR;  Surgeon: Arta Silence, MD;  Location: [facility] ENDOSCOPY;  Service: Endoscopy;  Laterality: N/A;   FRACTURE SURGERY  2008    right and left femurs   JOINT REPLACEMENT  1998/1999   knee boths   KNEE SURGERY Right    TKA   KNEE SURGERY Left    TKA   KNEE SURGERY Right    Periprosthetic fracture /otif   MASTECTOMY  02/20/1983   left    PARS PLANA  VITRECTOMY Right 06/06/2016   Procedure: VITRECTOMY WITH CULTURES AND INJECTION OF ANTIBIOTICS; ANTERIOR CHAMBER WASHOUT;  Surgeon: Jalene Mullet, MD;  Location: [place];  Service: Ophthalmology;  Laterality: Right;     The history is provided by the patient. No language interpreter was used.  Fall Associated symptoms include headaches. Pertinent negatives include no chest pain, no abdominal pain and no shortness of breath.      Home Medications Prior to Admission medications   Medication Sig Start Date End Date Taking? Authorizing Provider  albuterol (PROVENTIL HFA;VENTOLIN HFA) 108 (90 Base) MCG/ACT inhaler Inhale 2 puffs into the lungs every 6 (six) hours as needed for wheezing or shortness of breath. 03/01/18   Gerlene Fee, NP  ALPRAZolam Duanne Moron) 0.25 MG tablet Take 1 tablet (0.25 mg total) by mouth 3 (three) times daily as needed for anxiety or sleep. 03/01/18   Gerlene Fee, NP  amLODipine (NORVASC) 5 MG tablet Take 1 tablet (5 mg total) by mouth daily. 03/01/18   Gerlene Fee, NP  Calcium Carbonate-Vitamin D 600-200 MG-UNIT TABS Take 1 tablet by mouth every evening.     [provider]  clopidogrel (PLAVIX) 75 MG tablet Take 1 tablet (75 mg total) by mouth daily with  breakfast. 03/01/18   Gerlene Fee, NP  docusate sodium (COLACE) 100 MG capsule Take 100 mg by mouth 2 (two) times daily as needed for mild constipation. 02/23/18   [provider]  donepezil (ARICEPT) 10 MG tablet Take 1 tablet (10 mg total) by mouth at bedtime. 03/01/18   Gerlene Fee, NP  ferrous gluconate (FERGON) 324 MG tablet Take 1 tablet (324 mg total) by mouth daily with breakfast. 03/01/18   Gerlene Fee, NP  FLUoxetine (PROZAC) 20 MG capsule Take 1 capsule (20 mg total) by mouth daily. 03/01/18   Gerlene Fee, NP  folic acid (FOLVITE) 1 MG tablet Take 1 tablet (1 mg total) by mouth daily. 03/01/18   Gerlene Fee, NP  furosemide (LASIX) 20 MG tablet Take 20 mg by mouth  daily.    [provider]  gabapentin (NEURONTIN) 300 MG capsule Take 1 capsule (300 mg total) by mouth 3 (three) times daily. 03/01/18   Gerlene Fee, NP  glipiZIDE (GLUCOTROL XL) 2.5 MG 24 hr tablet Take 2.5 mg by mouth daily. 03/15/19   [provider]  HYDROcodone-acetaminophen (NORCO/VICODIN) 5-325 MG tablet Take 1 tablet by mouth every 6 (six) hours as needed for moderate pain. 10/27/19   Carole Civil, MD  ipratropium-albuterol (DUONEB) 0.5-2.5 (3) MG/3ML SOLN Take 3 mLs by nebulization 2 (two) times daily. And every 6 hours as needed 03/01/18   Gerlene Fee, NP  KLOR-CON M20 20 MEQ tablet Take 20 mEq by mouth daily. 04/06/19   [provider]  latanoprost (XALATAN) 0.005 % ophthalmic solution Place 1 drop into both eyes at bedtime. 03/01/18   Gerlene Fee, NP  levETIRAcetam (KEPPRA) 250 MG tablet Take 1 tablet (250 mg total) by mouth 2 (two) times daily. 03/01/18   Gerlene Fee, NP  losartan (COZAAR) 25 MG tablet Take 50 mg by mouth daily. 02/01/19   [provider]  lovastatin (MEVACOR) 10 MG tablet Take 1 tablet (10 mg total) by mouth at bedtime. 03/01/18   Gerlene Fee, NP  NON FORMULARY Diet Type:  NAS 02/16/18   [provider]  omeprazole (PRILOSEC) 40 MG capsule Take 40 mg by mouth daily. 04/25/19   [provider]  oxyCODONE-acetaminophen (PERCOCET) 5-325 MG tablet Take 1 pain medicine every 6 hours for pain not relieved by Tylenol or Motrin 05/04/21   Milton Ferguson, MD  OXYGEN Inhale 2 L/min into the lungs continuous. Titrate as needed to maintain satuation > 90% 02/16/18   [provider]  polyethylene glycol (MIRALAX / GLYCOLAX) packet Take 17 g by mouth daily as needed for mild constipation. 02/23/18   [provider]  timolol (BETIMOL) 0.5 % ophthalmic solution Place 1 drop into the left eye daily. 03/01/18   Gerlene Fee, NP      Allergies    Patient has no known allergies.    Review of  Systems   Review of Systems  Constitutional:  Negative for chills and fever.  HENT:  Negative for facial swelling and trouble swallowing.   Eyes:  Negative for photophobia and visual disturbance.  Respiratory:  Negative for cough and shortness of breath.   Cardiovascular:  Negative for chest pain and palpitations.  Gastrointestinal:  Negative for abdominal pain, nausea and vomiting.  Endocrine: Negative for polydipsia and polyuria.  Genitourinary:  Negative for difficulty urinating and hematuria.  Musculoskeletal:  Positive for arthralgias. Negative for gait problem and joint swelling.  Skin:  Negative for  pallor and rash.  Neurological:  Positive for headaches. Negative for syncope.  Psychiatric/Behavioral:  Negative for agitation and confusion.    Physical Exam Updated Vital Signs BP (!) 136/49   Pulse 75   Resp (!) 22   SpO2 98%  Physical Exam Vitals and nursing note reviewed.  Constitutional:      General: She is not in acute distress.    Appearance: Normal appearance. She is obese. She is not ill-appearing.  HENT:     Head: Normocephalic and atraumatic. No raccoon eyes, Battle's sign, right periorbital erythema or left periorbital erythema.     Jaw: There is normal jaw occlusion. No trismus or swelling.     Right Ear: External ear normal.     Left Ear: External ear normal.     Nose: Nose normal.     Mouth/Throat:     Mouth: Mucous membranes are moist.     Pharynx: Uvula midline.  Eyes:     General: No scleral icterus.       Right eye: No discharge.        Left eye: No discharge.     Comments: Right-sided pupillary abnormality concerning for cataract  Cardiovascular:     Rate and Rhythm: Normal rate and regular rhythm.     Pulses: Normal pulses.     Heart sounds: Normal heart sounds.  Pulmonary:     Effort: Pulmonary effort is normal. No respiratory distress.     Breath sounds: Normal breath sounds.  Abdominal:     General: Abdomen is flat.     Tenderness: There  is no abdominal tenderness.  Musculoskeletal:        General: Normal range of motion.     Cervical back: Normal range of motion.     Right lower leg: No edema.     Left lower leg: No edema.  Skin:    General: Skin is warm and dry.     Capillary Refill: Capillary refill takes less than 2 seconds.  Neurological:     Mental Status: She is alert and oriented to person, place, and time.     GCS: GCS eye subscore is 4. GCS verbal subscore is 5. GCS motor subscore is 6.  Psychiatric:        Mood and Affect: Mood normal.        Behavior: Behavior normal.    ED Results / Procedures / Treatments   Labs (all labs ordered are listed, but only abnormal results are displayed) Labs Reviewed - No data to display  EKG EKG Interpretation  Date/Time:  Saturday May 25 2021 23:33:15 EDT Ventricular Rate:  71 PR Interval:  74 QRS Duration: 106 QT Interval:  408 QTC Calculation: 444 R Axis:   204 Text Interpretation: Right and left arm electrode reversal, interpretation assumes no reversal Sinus rhythm Atrial premature complex Short PR interval Interpretation limited secondary to artifact similar to prior Confirmed by Wynona Dove ([id number]) on 05/26/2021 12:04:22 AM  Radiology DG Pelvis 1-2 Views  Result Date: 05/26/2021 CLINICAL DATA:  88 year old post fall. EXAM: PELVIS - 1-2 VIEW COMPARISON:  Pelvis and right hip radiograph 05/04/2021 FINDINGS: No evidence of acute pelvic or hip fracture. Bilateral hip osteoarthritis. Intact pubic rami. Pubic symphysis and sacroiliac joints are congruent. Soft tissue attenuation from habitus limits assessment. IMPRESSION: No evidence of pelvic or hip fracture. Electronically Signed   By: Keith Rake M.D.   On: 05/26/2021 00:41   DG Shoulder Right  Result Date: 05/26/2021 CLINICAL DATA:  88 year old post fall.  Shoulder pain. EXAM: RIGHT SHOULDER - 2+ VIEW COMPARISON:  Radiograph 05/04/2021 FINDINGS: No acute fracture or dislocation. Chronic glenohumeral and  acromioclavicular arthropathy. Ossified intra-articular bodies versus capsular calcifications in the axillary recess. There is subcortical cystic change in the lateral head typical of rotator cuff arthropathy. Again seen increased subacromial space which can be seen in the setting of effusion, this is chronic IMPRESSION: 1. No acute fracture or dislocation of the right shoulder. 2. Chronic glenohumeral and acromioclavicular arthropathy. 3. Increased subacromial space may be due to effusion, unchanged from prior. Electronically Signed   By: Keith Rake M.D.   On: 05/26/2021 00:39   CT Head Wo Contrast  Result Date: 05/26/2021 CLINICAL DATA:  Trauma. EXAM: CT HEAD WITHOUT CONTRAST CT CERVICAL SPINE WITHOUT CONTRAST TECHNIQUE: Multidetector CT imaging of the head and cervical spine was performed following the standard protocol without intravenous contrast. Multiplanar CT image reconstructions of the cervical spine were also generated. RADIATION DOSE REDUCTION: This exam was performed according to the departmental dose-optimization program which includes automated exposure control, adjustment of the mA and/or kV according to patient size and/or use of iterative reconstruction technique. COMPARISON:  CT dated 05/04/2021. FINDINGS: CT HEAD FINDINGS Brain: Mild age-related atrophy and chronic microvascular ischemic changes. There is no acute intracranial hemorrhage. No mass effect or midline shift. No extra-axial fluid collection. Vascular: No hyperdense vessel or unexpected calcification. Skull: Normal. Negative for fracture or focal lesion. Sinuses/Orbits: No acute finding. Other: None CT CERVICAL SPINE FINDINGS Alignment: No acute subluxation. Skull base and vertebrae: No acute fracture.  Osteopenia. Soft tissues and spinal canal: No prevertebral fluid or swelling. No visible canal hematoma. Disc levels:  Multilevel degenerative changes. Upper chest: Negative. Other: Bilateral carotid bulb calcified plaques. A  2 cm right thyroid nodule. In the setting of significant comorbidities or limited life expectancy, no follow-up recommended (ref: J Am Coll Radiol. 2015 Feb;12(2): 143-50). IMPRESSION: 1. No acute intracranial pathology. Mild age-related atrophy and chronic microvascular ischemic changes. 2. No acute fracture or subluxation of the cervical spine. Electronically Signed   By: Anner Crete M.D.   On: 05/26/2021 00:47   CT Cervical Spine Wo Contrast  Result Date: 05/26/2021 CLINICAL DATA:  Trauma. EXAM: CT HEAD WITHOUT CONTRAST CT CERVICAL SPINE WITHOUT CONTRAST TECHNIQUE: Multidetector CT imaging of the head and cervical spine was performed following the standard protocol without intravenous contrast. Multiplanar CT image reconstructions of the cervical spine were also generated. RADIATION DOSE REDUCTION: This exam was performed according to the departmental dose-optimization program which includes automated exposure control, adjustment of the mA and/or kV according to patient size and/or use of iterative reconstruction technique. COMPARISON:  CT dated 05/04/2021. FINDINGS: CT HEAD FINDINGS Brain: Mild age-related atrophy and chronic microvascular ischemic changes. There is no acute intracranial hemorrhage. No mass effect or midline shift. No extra-axial fluid collection. Vascular: No hyperdense vessel or unexpected calcification. Skull: Normal. Negative for fracture or focal lesion. Sinuses/Orbits: No acute finding. Other: None CT CERVICAL SPINE FINDINGS Alignment: No acute subluxation. Skull base and vertebrae: No acute fracture.  Osteopenia. Soft tissues and spinal canal: No prevertebral fluid or swelling. No visible canal hematoma. Disc levels:  Multilevel degenerative changes. Upper chest: Negative. Other: Bilateral carotid bulb calcified plaques. A 2 cm right thyroid nodule. In the setting of significant comorbidities or limited life expectancy, no follow-up recommended (ref: J Am Coll Radiol. 2015  Feb;12(2): 143-50). IMPRESSION: 1. No acute intracranial pathology. Mild age-related atrophy and chronic microvascular ischemic changes. 2.  No acute fracture or subluxation of the cervical spine. Electronically Signed   By: Anner Crete M.D.   On: 05/26/2021 00:47   DG Chest Portable 1 View  Result Date: 05/26/2021 CLINICAL DATA:  88 year old post fall. EXAM: PORTABLE CHEST 1 VIEW COMPARISON:  Chest radiograph 11/02/2017. FINDINGS: Low lung volumes. Normal heart size with stable mediastinal contours, aortic atherosclerosis. No pneumothorax, pleural effusion, or focal airspace disease. Normal pulmonary vasculature. Left shoulder arthropathy appears chronic. IMPRESSION: Low lung volumes without acute abnormality. Electronically Signed   By: Keith Rake M.D.   On: 05/26/2021 00:38    Procedures Procedures    Medications Ordered in ED Medications - No data to display  ED Course/ Medical Decision Making/ A&P                           Medical Decision Making Amount and/or Complexity of Data Reviewed Radiology: ordered.    CC: Fall  This patient presents to the Emergency Department for the above complaint. This involves an extensive number of treatment options and is a complaint that carries with it a high risk of complications and morbidity. Vital signs were reviewed. Serious etiologies considered.  Differential diagnoses for head trauma includes subdural hematoma, epidural hematoma, acute concussion, traumatic subarachnoid hemorrhage, cerebral contusions, etc.   Record review:  Previous records obtained and reviewed  Prior ED visits, prior labs and imaging  Medical and surgical history as noted above.   Work up as above, notable for:  Labs & imaging results that were available during my care of the patient were visualized by me and considered in my medical decision making.   I ordered imaging studies which included CT head, CT cervical spine, chest x-ray shoulder x-ray,  pelvis x-ray and I visualized the imaging and I agree with radiologist interpretation.  Thyroid nodule, chronic changes noted.  No acute findings.  Cardiac monitoring reviewed and interpreted personally which shows NSR  Reassessment:  Patient reports that she is feeling better.  No fracture or acute findings on CT or x-ray.  Patient with likely mechanical fall.  No syncope.  No thinners although she is on Plavix.  Patient reports that she is feeling at her baseline, she is agreeable to plan to discharge back home.   The patient improved significantly and was discharged in stable condition. Detailed discussions were had with the patient regarding current findings, and need for close f/u with PCP or on call doctor. The patient has been instructed to return immediately if the symptoms worsen in any way for re-evaluation. Patient verbalized understanding and is in agreement with current care plan. All questions answered prior to discharge.                   Social determinants of health include -  Lives at nursing facility  Social History   Socioeconomic History   Marital status: Widowed    Spouse name: Not on file   Number of children: 6   Years of education: 12   Highest education level: Not on file  Occupational History   Occupation: retired, CNA [employer]  Tobacco Use   Smoking status: Never   Smokeless tobacco: Never  Substance and Sexual Activity   Alcohol use: No   Drug use: No   Sexual activity: Never    Birth control/protection: Surgical  Other Topics Concern   Not on file  Social History Narrative   Lives w/ daughter Carolynn Comment).  Retired   Right handed.   Education high school   Caffeine None            Social Determinants of Health   Financial Resource Strain: Not on file  Food Insecurity: Not on file  Transportation Needs: Not on file  Physical Activity: Not on file  Stress: Not on file  Social Connections: Not on file  Intimate Partner  Violence: Not on file      This chart was dictated using voice recognition software.  Despite best efforts to proofread,  errors can occur which can change the documentation meaning.         Final Clinical Impression(s) / ED Diagnoses Final diagnoses:  Fall, initial encounter    Rx / DC Orders ED Discharge Orders     None         Jeanell Sparrow, DO 05/26/21 [id number]

## 2021-05-26 NOTE — ED Notes (Signed)
Patient transported to CT scan . 

## 2021-06-13 ENCOUNTER — Emergency Department (HOSPITAL_COMMUNITY): Payer: Medicare Other

## 2021-06-13 ENCOUNTER — Encounter (HOSPITAL_COMMUNITY): Payer: Self-pay | Admitting: Oncology

## 2021-06-13 ENCOUNTER — Other Ambulatory Visit: Payer: Self-pay

## 2021-06-13 ENCOUNTER — Inpatient Hospital Stay (HOSPITAL_COMMUNITY)
Admission: EM | Admit: 2021-06-13 | Discharge: 2021-06-21 | DRG: 871 | Disposition: A | Discharge status: Skilled Nursing Facility | Payer: Medicare Other | Attending: Internal Medicine | Admitting: Internal Medicine

## 2021-06-13 DIAGNOSIS — H409 Unspecified glaucoma: Secondary | ICD-10-CM | POA: Diagnosis not present

## 2021-06-13 DIAGNOSIS — J45909 Unspecified asthma, uncomplicated: Secondary | ICD-10-CM | POA: Diagnosis not present

## 2021-06-13 DIAGNOSIS — E870 Hyperosmolality and hypernatremia: Secondary | ICD-10-CM | POA: Diagnosis present

## 2021-06-13 DIAGNOSIS — K219 Gastro-esophageal reflux disease without esophagitis: Secondary | ICD-10-CM | POA: Diagnosis not present

## 2021-06-13 DIAGNOSIS — B370 Candidal stomatitis: Secondary | ICD-10-CM | POA: Diagnosis not present

## 2021-06-13 DIAGNOSIS — F02A4 Dementia in other diseases classified elsewhere, mild, with anxiety: Secondary | ICD-10-CM | POA: Diagnosis not present

## 2021-06-13 DIAGNOSIS — J449 Chronic obstructive pulmonary disease, unspecified: Secondary | ICD-10-CM | POA: Diagnosis present

## 2021-06-13 DIAGNOSIS — J9621 Acute and chronic respiratory failure with hypoxia: Secondary | ICD-10-CM | POA: Diagnosis present

## 2021-06-13 DIAGNOSIS — D472 Monoclonal gammopathy: Secondary | ICD-10-CM | POA: Diagnosis present

## 2021-06-13 DIAGNOSIS — E669 Obesity, unspecified: Secondary | ICD-10-CM | POA: Diagnosis present

## 2021-06-13 DIAGNOSIS — Z823 Family history of stroke: Secondary | ICD-10-CM

## 2021-06-13 DIAGNOSIS — R410 Disorientation, unspecified: Secondary | ICD-10-CM | POA: Diagnosis not present

## 2021-06-13 DIAGNOSIS — J9611 Chronic respiratory failure with hypoxia: Secondary | ICD-10-CM | POA: Diagnosis not present

## 2021-06-13 DIAGNOSIS — Z79899 Other long term (current) drug therapy: Secondary | ICD-10-CM

## 2021-06-13 DIAGNOSIS — M6281 Muscle weakness (generalized): Secondary | ICD-10-CM | POA: Diagnosis not present

## 2021-06-13 DIAGNOSIS — E1165 Type 2 diabetes mellitus with hyperglycemia: Secondary | ICD-10-CM | POA: Diagnosis present

## 2021-06-13 DIAGNOSIS — R6521 Severe sepsis with septic shock: Secondary | ICD-10-CM | POA: Diagnosis present

## 2021-06-13 DIAGNOSIS — I10 Essential (primary) hypertension: Secondary | ICD-10-CM | POA: Diagnosis present

## 2021-06-13 DIAGNOSIS — J188 Other pneumonia, unspecified organism: Secondary | ICD-10-CM | POA: Diagnosis present

## 2021-06-13 DIAGNOSIS — R4182 Altered mental status, unspecified: Secondary | ICD-10-CM | POA: Diagnosis not present

## 2021-06-13 DIAGNOSIS — E876 Hypokalemia: Secondary | ICD-10-CM | POA: Diagnosis not present

## 2021-06-13 DIAGNOSIS — N179 Acute kidney failure, unspecified: Secondary | ICD-10-CM | POA: Diagnosis present

## 2021-06-13 DIAGNOSIS — F0283 Dementia in other diseases classified elsewhere, unspecified severity, with mood disturbance: Secondary | ICD-10-CM | POA: Diagnosis present

## 2021-06-13 DIAGNOSIS — Z9012 Acquired absence of left breast and nipple: Secondary | ICD-10-CM

## 2021-06-13 DIAGNOSIS — F02A3 Dementia in other diseases classified elsewhere, mild, with mood disturbance: Secondary | ICD-10-CM | POA: Diagnosis not present

## 2021-06-13 DIAGNOSIS — E11649 Type 2 diabetes mellitus with hypoglycemia without coma: Secondary | ICD-10-CM | POA: Diagnosis not present

## 2021-06-13 DIAGNOSIS — Z6841 Body Mass Index (BMI) 40.0 and over, adult: Secondary | ICD-10-CM | POA: Diagnosis not present

## 2021-06-13 DIAGNOSIS — R41841 Cognitive communication deficit: Secondary | ICD-10-CM | POA: Diagnosis not present

## 2021-06-13 DIAGNOSIS — R627 Adult failure to thrive: Secondary | ICD-10-CM | POA: Diagnosis present

## 2021-06-13 DIAGNOSIS — Z66 Do not resuscitate: Secondary | ICD-10-CM | POA: Diagnosis present

## 2021-06-13 DIAGNOSIS — Z20822 Contact with and (suspected) exposure to covid-19: Secondary | ICD-10-CM | POA: Diagnosis present

## 2021-06-13 DIAGNOSIS — K143 Hypertrophy of tongue papillae: Secondary | ICD-10-CM | POA: Diagnosis not present

## 2021-06-13 DIAGNOSIS — M5136 Other intervertebral disc degeneration, lumbar region: Secondary | ICD-10-CM | POA: Diagnosis not present

## 2021-06-13 DIAGNOSIS — Z9181 History of falling: Secondary | ICD-10-CM | POA: Diagnosis not present

## 2021-06-13 DIAGNOSIS — Z9981 Dependence on supplemental oxygen: Secondary | ICD-10-CM

## 2021-06-13 DIAGNOSIS — F418 Other specified anxiety disorders: Secondary | ICD-10-CM | POA: Diagnosis present

## 2021-06-13 DIAGNOSIS — B3789 Other sites of candidiasis: Secondary | ICD-10-CM | POA: Diagnosis present

## 2021-06-13 DIAGNOSIS — R531 Weakness: Principal | ICD-10-CM

## 2021-06-13 DIAGNOSIS — Z741 Need for assistance with personal care: Secondary | ICD-10-CM | POA: Diagnosis not present

## 2021-06-13 DIAGNOSIS — I129 Hypertensive chronic kidney disease with stage 1 through stage 4 chronic kidney disease, or unspecified chronic kidney disease: Secondary | ICD-10-CM | POA: Diagnosis present

## 2021-06-13 DIAGNOSIS — N1832 Chronic kidney disease, stage 3b: Secondary | ICD-10-CM | POA: Diagnosis not present

## 2021-06-13 DIAGNOSIS — J440 Chronic obstructive pulmonary disease with acute lower respiratory infection: Secondary | ICD-10-CM | POA: Diagnosis present

## 2021-06-13 DIAGNOSIS — E1122 Type 2 diabetes mellitus with diabetic chronic kidney disease: Secondary | ICD-10-CM | POA: Diagnosis present

## 2021-06-13 DIAGNOSIS — E119 Type 2 diabetes mellitus without complications: Secondary | ICD-10-CM

## 2021-06-13 DIAGNOSIS — R1310 Dysphagia, unspecified: Secondary | ICD-10-CM | POA: Diagnosis not present

## 2021-06-13 DIAGNOSIS — Z7984 Long term (current) use of oral hypoglycemic drugs: Secondary | ICD-10-CM

## 2021-06-13 DIAGNOSIS — Z96653 Presence of artificial knee joint, bilateral: Secondary | ICD-10-CM | POA: Diagnosis present

## 2021-06-13 DIAGNOSIS — Z7902 Long term (current) use of antithrombotics/antiplatelets: Secondary | ICD-10-CM

## 2021-06-13 DIAGNOSIS — I152 Hypertension secondary to endocrine disorders: Secondary | ICD-10-CM | POA: Diagnosis not present

## 2021-06-13 DIAGNOSIS — R680 Hypothermia, not associated with low environmental temperature: Secondary | ICD-10-CM | POA: Diagnosis not present

## 2021-06-13 DIAGNOSIS — G40909 Epilepsy, unspecified, not intractable, without status epilepticus: Secondary | ICD-10-CM | POA: Diagnosis not present

## 2021-06-13 DIAGNOSIS — I1310 Hypertensive heart and chronic kidney disease without heart failure, with stage 1 through stage 4 chronic kidney disease, or unspecified chronic kidney disease: Secondary | ICD-10-CM | POA: Diagnosis not present

## 2021-06-13 DIAGNOSIS — J189 Pneumonia, unspecified organism: Secondary | ICD-10-CM | POA: Diagnosis not present

## 2021-06-13 DIAGNOSIS — E1149 Type 2 diabetes mellitus with other diabetic neurological complication: Secondary | ICD-10-CM

## 2021-06-13 DIAGNOSIS — M2550 Pain in unspecified joint: Secondary | ICD-10-CM | POA: Diagnosis not present

## 2021-06-13 DIAGNOSIS — G20 Parkinson's disease: Secondary | ICD-10-CM | POA: Diagnosis present

## 2021-06-13 DIAGNOSIS — R29810 Facial weakness: Secondary | ICD-10-CM | POA: Diagnosis not present

## 2021-06-13 DIAGNOSIS — F0390 Unspecified dementia without behavioral disturbance: Secondary | ICD-10-CM | POA: Diagnosis not present

## 2021-06-13 DIAGNOSIS — E785 Hyperlipidemia, unspecified: Secondary | ICD-10-CM | POA: Diagnosis present

## 2021-06-13 DIAGNOSIS — Z8673 Personal history of transient ischemic attack (TIA), and cerebral infarction without residual deficits: Secondary | ICD-10-CM

## 2021-06-13 DIAGNOSIS — R278 Other lack of coordination: Secondary | ICD-10-CM | POA: Diagnosis not present

## 2021-06-13 DIAGNOSIS — A419 Sepsis, unspecified organism: Secondary | ICD-10-CM | POA: Diagnosis not present

## 2021-06-13 DIAGNOSIS — J431 Panlobular emphysema: Secondary | ICD-10-CM | POA: Diagnosis not present

## 2021-06-13 DIAGNOSIS — Z7401 Bed confinement status: Secondary | ICD-10-CM | POA: Diagnosis not present

## 2021-06-13 DIAGNOSIS — R197 Diarrhea, unspecified: Secondary | ICD-10-CM | POA: Diagnosis present

## 2021-06-13 DIAGNOSIS — R262 Difficulty in walking, not elsewhere classified: Secondary | ICD-10-CM | POA: Diagnosis not present

## 2021-06-13 LAB — URINALYSIS, ROUTINE W REFLEX MICROSCOPIC
Bilirubin Urine: NEGATIVE
Glucose, UA: NEGATIVE mg/dL
Hgb urine dipstick: NEGATIVE
Ketones, ur: 5 mg/dL — AB
Nitrite: NEGATIVE
Protein, ur: 30 mg/dL — AB
Specific Gravity, Urine: 1.015 (ref 1.005–1.030)
pH: 5 (ref 5.0–8.0)

## 2021-06-13 LAB — BLOOD GAS, ARTERIAL
Acid-Base Excess: 4.4 mmol/L — ABNORMAL HIGH (ref 0.0–2.0)
Bicarbonate: 35.9 mmol/L — ABNORMAL HIGH (ref 20.0–28.0)
Drawn by: 23532
O2 Content: 4 L/min
O2 Saturation: 98.7 %
Patient temperature: 36.2
pCO2 arterial: 91 mmHg (ref 32–48)
pH, Arterial: 7.2 — ABNORMAL LOW (ref 7.35–7.45)
pO2, Arterial: 100 mmHg (ref 83–108)

## 2021-06-13 LAB — CBC WITH DIFFERENTIAL/PLATELET
Abs Immature Granulocytes: 0.17 10*3/uL — ABNORMAL HIGH (ref 0.00–0.07)
Basophils Absolute: 0.1 10*3/uL (ref 0.0–0.1)
Basophils Relative: 1 %
Eosinophils Absolute: 0.1 10*3/uL (ref 0.0–0.5)
Eosinophils Relative: 1 %
HCT: 37.6 % (ref 36.0–46.0)
Hemoglobin: 10.4 g/dL — ABNORMAL LOW (ref 12.0–15.0)
Immature Granulocytes: 2 %
Lymphocytes Relative: 9 %
Lymphs Abs: 1 10*3/uL (ref 0.7–4.0)
MCH: 29.6 pg (ref 26.0–34.0)
MCHC: 27.7 g/dL — ABNORMAL LOW (ref 30.0–36.0)
MCV: 107.1 fL — ABNORMAL HIGH (ref 80.0–100.0)
Monocytes Absolute: 0.7 10*3/uL (ref 0.1–1.0)
Monocytes Relative: 7 %
Neutro Abs: 9 10*3/uL — ABNORMAL HIGH (ref 1.7–7.7)
Neutrophils Relative %: 80 %
Platelets: 575 10*3/uL — ABNORMAL HIGH (ref 150–400)
RBC: 3.51 MIL/uL — ABNORMAL LOW (ref 3.87–5.11)
RDW: 13.2 % (ref 11.5–15.5)
WBC: 11 10*3/uL — ABNORMAL HIGH (ref 4.0–10.5)
nRBC: 0 % (ref 0.0–0.2)

## 2021-06-13 LAB — COMPREHENSIVE METABOLIC PANEL
ALT: 8 U/L (ref 0–44)
AST: 12 U/L — ABNORMAL LOW (ref 15–41)
Albumin: 2.3 g/dL — ABNORMAL LOW (ref 3.5–5.0)
Alkaline Phosphatase: 61 U/L (ref 38–126)
Anion gap: 12 (ref 5–15)
BUN: 39 mg/dL — ABNORMAL HIGH (ref 8–23)
CO2: 32 mmol/L (ref 22–32)
Calcium: 9.1 mg/dL (ref 8.9–10.3)
Chloride: 103 mmol/L (ref 98–111)
Creatinine, Ser: 1.88 mg/dL — ABNORMAL HIGH (ref 0.44–1.00)
GFR, Estimated: 25 mL/min — ABNORMAL LOW (ref >60)
Glucose, Bld: 71 mg/dL (ref 70–99)
Potassium: 3.6 mmol/L (ref 3.5–5.1)
Sodium: 147 mmol/L — ABNORMAL HIGH (ref 135–145)
Total Bilirubin: 0.9 mg/dL (ref 0.3–1.2)
Total Protein: 6 g/dL — ABNORMAL LOW (ref 6.5–8.1)

## 2021-06-13 LAB — PROCALCITONIN: Procalcitonin: 0.53 ng/mL

## 2021-06-13 LAB — LACTIC ACID, PLASMA: Lactic Acid, Venous: 0.7 mmol/L (ref 0.5–1.9)

## 2021-06-13 LAB — LIPASE, BLOOD: Lipase: 36 U/L (ref 11–51)

## 2021-06-13 LAB — SARS CORONAVIRUS 2 BY RT PCR: SARS Coronavirus 2 by RT PCR: NEGATIVE

## 2021-06-13 LAB — CBG MONITORING, ED
Glucose-Capillary: 66 mg/dL — ABNORMAL LOW (ref 70–99)
Glucose-Capillary: 109 mg/dL — ABNORMAL HIGH (ref 70–99)

## 2021-06-13 LAB — AMMONIA: Ammonia: 36 umol/L — ABNORMAL HIGH (ref 9–35)

## 2021-06-13 MED ORDER — SODIUM CHLORIDE 0.9 % IV SOLN
500.0000 mg | INTRAVENOUS | Status: AC
  Administered 2021-06-14 – 2021-06-18 (×5): 500 mg via INTRAVENOUS
  Filled 2021-06-13 (×5): qty 5

## 2021-06-13 MED ORDER — ACETAMINOPHEN 650 MG RE SUPP: 650.0000 mg | Freq: Four times a day (QID) | RECTAL | Status: DC | PRN

## 2021-06-13 MED ORDER — DEXTROSE 50 % IV SOLN
12.5000 g | Freq: Once | INTRAVENOUS | Status: AC
  Administered 2021-06-13: 12.5 g via INTRAVENOUS
  Filled 2021-06-13: qty 50

## 2021-06-13 MED ORDER — GABAPENTIN 300 MG PO CAPS
300.0000 mg | ORAL_CAPSULE | Freq: Two times a day (BID) | ORAL | Status: DC
  Administered 2021-06-16 – 2021-06-21 (×10): 300 mg via ORAL
  Filled 2021-06-13 (×10): qty 1

## 2021-06-13 MED ORDER — SODIUM CHLORIDE 0.9 % IV SOLN: INTRAVENOUS | Status: DC

## 2021-06-13 MED ORDER — SODIUM CHLORIDE 0.9 % IV BOLUS
1000.0000 mL | Freq: Once | INTRAVENOUS | Status: AC
  Administered 2021-06-13: 1000 mL via INTRAVENOUS

## 2021-06-13 MED ORDER — SODIUM CHLORIDE 0.9 % IV SOLN
500.0000 mg | Freq: Once | INTRAVENOUS | Status: AC
  Administered 2021-06-13: 500 mg via INTRAVENOUS
  Filled 2021-06-13: qty 5

## 2021-06-13 MED ORDER — SODIUM CHLORIDE 0.9 % IV SOLN
1.0000 g | Freq: Once | INTRAVENOUS | Status: AC
  Administered 2021-06-13: 1 g via INTRAVENOUS
  Filled 2021-06-13: qty 10

## 2021-06-13 MED ORDER — TIMOLOL MALEATE 0.5 % OP SOLN
1.0000 [drp] | Freq: Every day | OPHTHALMIC | Status: DC
Start: 2021-06-13 — End: 2021-06-21
  Administered 2021-06-14 – 2021-06-21 (×9): 1 [drp] via OPHTHALMIC
  Filled 2021-06-13: qty 5

## 2021-06-13 MED ORDER — ACETAMINOPHEN 325 MG PO TABS
650.0000 mg | ORAL_TABLET | Freq: Four times a day (QID) | ORAL | Status: DC | PRN
  Administered 2021-06-17 – 2021-06-19 (×5): 650 mg via ORAL
  Filled 2021-06-13 (×5): qty 2

## 2021-06-13 MED ORDER — SODIUM CHLORIDE 0.9 % IV SOLN
2.0000 g | INTRAVENOUS | Status: AC
  Administered 2021-06-14 – 2021-06-18 (×5): 2 g via INTRAVENOUS
  Filled 2021-06-13 (×5): qty 20

## 2021-06-13 MED ORDER — ONDANSETRON HCL 4 MG PO TABS: 4.0000 mg | ORAL_TABLET | Freq: Four times a day (QID) | ORAL | Status: DC | PRN

## 2021-06-13 MED ORDER — LATANOPROST 0.005 % OP SOLN
1.0000 [drp] | Freq: Every day | OPHTHALMIC | Status: DC
Start: 2021-06-13 — End: 2021-06-21
  Administered 2021-06-14 – 2021-06-20 (×8): 1 [drp] via OPHTHALMIC
  Filled 2021-06-13: qty 2.5

## 2021-06-13 MED ORDER — IPRATROPIUM-ALBUTEROL 0.5-2.5 (3) MG/3ML IN SOLN
3.0000 mL | Freq: Four times a day (QID) | RESPIRATORY_TRACT | Status: DC
  Administered 2021-06-13: 3 mL via RESPIRATORY_TRACT
  Filled 2021-06-13: qty 3

## 2021-06-13 MED ORDER — GUAIFENESIN ER 600 MG PO TB12
600.0000 mg | ORAL_TABLET | Freq: Two times a day (BID) | ORAL | Status: DC
  Administered 2021-06-16 – 2021-06-18 (×4): 600 mg via ORAL
  Filled 2021-06-13 (×4): qty 1

## 2021-06-13 MED ORDER — IPRATROPIUM-ALBUTEROL 0.5-2.5 (3) MG/3ML IN SOLN
3.0000 mL | Freq: Three times a day (TID) | RESPIRATORY_TRACT | Status: DC
  Administered 2021-06-14 (×2): 3 mL via RESPIRATORY_TRACT
  Filled 2021-06-13 (×2): qty 3

## 2021-06-13 MED ORDER — CALCIUM CARBONATE ANTACID 500 MG PO CHEW: 1.0000 | CHEWABLE_TABLET | Freq: Every day | ORAL | Status: DC | PRN

## 2021-06-13 MED ORDER — ONDANSETRON HCL 4 MG/2ML IJ SOLN: 4.0000 mg | Freq: Four times a day (QID) | INTRAMUSCULAR | Status: DC | PRN

## 2021-06-13 MED ORDER — DONEPEZIL HCL 10 MG PO TABS
10.0000 mg | ORAL_TABLET | Freq: Every day | ORAL | Status: DC
  Administered 2021-06-16 – 2021-06-20 (×5): 10 mg via ORAL
  Filled 2021-06-13 (×5): qty 1

## 2021-06-13 MED ORDER — FLUOXETINE HCL 20 MG PO CAPS
40.0000 mg | ORAL_CAPSULE | Freq: Every day | ORAL | Status: DC
  Administered 2021-06-17 – 2021-06-21 (×5): 40 mg via ORAL
  Filled 2021-06-13 (×5): qty 2

## 2021-06-13 MED ORDER — LEVETIRACETAM 250 MG PO TABS
250.0000 mg | ORAL_TABLET | Freq: Every day | ORAL | Status: DC
  Administered 2021-06-16 – 2021-06-21 (×6): 250 mg via ORAL
  Filled 2021-06-13 (×9): qty 1

## 2021-06-13 MED ORDER — PRAVASTATIN SODIUM 20 MG PO TABS
10.0000 mg | ORAL_TABLET | Freq: Every day | ORAL | Status: DC
  Administered 2021-06-16 – 2021-06-20 (×5): 10 mg via ORAL
  Filled 2021-06-13 (×5): qty 1

## 2021-06-13 MED ORDER — DEXTROSE IN LACTATED RINGERS 5 % IV SOLN: INTRAVENOUS | Status: DC

## 2021-06-13 MED ORDER — CLOPIDOGREL BISULFATE 75 MG PO TABS
75.0000 mg | ORAL_TABLET | Freq: Every day | ORAL | Status: DC
  Administered 2021-06-17 – 2021-06-21 (×5): 75 mg via ORAL
  Filled 2021-06-13 (×8): qty 1

## 2021-06-13 NOTE — ED Provider Notes (Signed)
[facility] DEPT Provider Note   CSN: [id number] Arrival date & time: 06/13/21  9735     History  Chief Complaint  Patient presents with   Weakness    Tiffany Hartman is a 88 y.o. female.  She has prior history of diabetes Parkinson's COPD.  She is newly living with her daughter here in [place] and has been there for a month.  It sounds like for the past couple weeks she has not been ambulatory and has been laying in bed.  Refusing to eat.  Daughter states she can barely stand at the side of the bed and use the commode.  She has been trying to feed her with minimal success.  Generally declining.  Ultimately called the ambulance today for medical evaluation.  Patient difficult historian denies any specific complaints.  She is normally on 2 L nasal cannula oxygen 24/7  The history is provided by the patient, the EMS personnel and a relative.  Weakness Severity:  Severe Onset quality:  Gradual Duration:  2 weeks Timing:  Constant Progression:  Worsening Chronicity:  New Relieved by:  Nothing Worsened by:  Activity Ineffective treatments:  Drinking fluids Associated symptoms: difficulty walking and foul-smelling urine   Associated symptoms: no abdominal pain, no chest pain, no cough, no falls, no fever, no loss of consciousness and no vomiting   Risk factors: neurologic disease        Home Medications Prior to Admission medications   Medication Sig Start Date End Date Taking? Authorizing Provider  albuterol (PROVENTIL HFA;VENTOLIN HFA) 108 (90 Base) MCG/ACT inhaler Inhale 2 puffs into the lungs every 6 (six) hours as needed for wheezing or shortness of breath. 03/01/18   Gerlene Fee, NP  ALPRAZolam Duanne Moron) 0.25 MG tablet Take 1 tablet (0.25 mg total) by mouth 3 (three) times daily as needed for anxiety or sleep. 03/01/18   Gerlene Fee, NP  amLODipine (NORVASC) 5 MG tablet Take 1 tablet (5 mg total) by mouth daily. 03/01/18   Gerlene Fee, NP   Calcium Carbonate-Vitamin D 600-200 MG-UNIT TABS Take 1 tablet by mouth every evening.     [provider]  clopidogrel (PLAVIX) 75 MG tablet Take 1 tablet (75 mg total) by mouth daily with breakfast. 03/01/18   Gerlene Fee, NP  docusate sodium (COLACE) 100 MG capsule Take 100 mg by mouth 2 (two) times daily as needed for mild constipation. 02/23/18   [provider]  donepezil (ARICEPT) 10 MG tablet Take 1 tablet (10 mg total) by mouth at bedtime. 03/01/18   Gerlene Fee, NP  ferrous gluconate (FERGON) 324 MG tablet Take 1 tablet (324 mg total) by mouth daily with breakfast. 03/01/18   Gerlene Fee, NP  FLUoxetine (PROZAC) 20 MG capsule Take 1 capsule (20 mg total) by mouth daily. 03/01/18   Gerlene Fee, NP  folic acid (FOLVITE) 1 MG tablet Take 1 tablet (1 mg total) by mouth daily. 03/01/18   Gerlene Fee, NP  furosemide (LASIX) 20 MG tablet Take 20 mg by mouth daily.    [provider]  gabapentin (NEURONTIN) 300 MG capsule Take 1 capsule (300 mg total) by mouth 3 (three) times daily. 03/01/18   Gerlene Fee, NP  glipiZIDE (GLUCOTROL XL) 2.5 MG 24 hr tablet Take 2.5 mg by mouth daily. 03/15/19   [provider]  HYDROcodone-acetaminophen (NORCO/VICODIN) 5-325 MG tablet Take 1 tablet by mouth every 6 (six) hours as needed for moderate  pain. 10/27/19   Carole Civil, MD  ipratropium-albuterol (DUONEB) 0.5-2.5 (3) MG/3ML SOLN Take 3 mLs by nebulization 2 (two) times daily. And every 6 hours as needed 03/01/18   Gerlene Fee, NP  KLOR-CON M20 20 MEQ tablet Take 20 mEq by mouth daily. 04/06/19   [provider]  latanoprost (XALATAN) 0.005 % ophthalmic solution Place 1 drop into both eyes at bedtime. 03/01/18   Gerlene Fee, NP  levETIRAcetam (KEPPRA) 250 MG tablet Take 1 tablet (250 mg total) by mouth 2 (two) times daily. 03/01/18   Gerlene Fee, NP  losartan (COZAAR) 25 MG tablet Take 50 mg by mouth daily. 02/01/19    [provider]  lovastatin (MEVACOR) 10 MG tablet Take 1 tablet (10 mg total) by mouth at bedtime. 03/01/18   Gerlene Fee, NP  NON FORMULARY Diet Type:  NAS 02/16/18   [provider]  omeprazole (PRILOSEC) 40 MG capsule Take 40 mg by mouth daily. 04/25/19   [provider]  oxyCODONE-acetaminophen (PERCOCET) 5-325 MG tablet Take 1 pain medicine every 6 hours for pain not relieved by Tylenol or Motrin 05/04/21   Milton Ferguson, MD  OXYGEN Inhale 2 L/min into the lungs continuous. Titrate as needed to maintain satuation > 90% 02/16/18   [provider]  polyethylene glycol (MIRALAX / GLYCOLAX) packet Take 17 g by mouth daily as needed for mild constipation. 02/23/18   [provider]  timolol (BETIMOL) 0.5 % ophthalmic solution Place 1 drop into the left eye daily. 03/01/18   Gerlene Fee, NP      Allergies    Patient has no known allergies.    Review of Systems   Review of Systems  Unable to perform ROS: Mental status change  Constitutional:  Negative for fever.  Respiratory:  Negative for cough.   Cardiovascular:  Negative for chest pain.  Gastrointestinal:  Negative for abdominal pain and vomiting.  Musculoskeletal:  Negative for falls.  Neurological:  Positive for weakness. Negative for loss of consciousness.    Physical Exam Updated Vital Signs BP (!) 121/55 (BP Location: Right Arm)   Pulse 76   Temp (!) 97.5 F (36.4 C) (Oral)   Resp 14   SpO2 96%  Physical Exam Vitals and nursing note reviewed.  Constitutional:      General: She is not in acute distress.    Appearance: She is well-developed. She is obese.  HENT:     Head: Normocephalic and atraumatic.     Mouth/Throat:     Mouth: Mucous membranes are dry.  Eyes:     Conjunctiva/sclera: Conjunctivae normal.     Comments: Cloudy eye on right consistent with glaucoma  Cardiovascular:     Rate and Rhythm: Normal rate and regular rhythm.     Heart sounds: Murmur heard.   Pulmonary:     Effort: Pulmonary effort is normal. No respiratory distress.     Breath sounds: Normal breath sounds.  Abdominal:     Palpations: Abdomen is soft.     Tenderness: There is no abdominal tenderness. There is no guarding or rebound.  Musculoskeletal:        General: No deformity.     Cervical back: Neck supple.  Skin:    General: Skin is warm and dry.     Capillary Refill: Capillary refill takes less than 2 seconds.  Neurological:     General: No focal deficit present.     Comments: Patient is awake and will  converse although has thick speech.  Is able to move her arms and legs to command although is generally weak.  Appears to have left facial droop.     ED Results / Procedures / Treatments   Labs (all labs ordered are listed, but only abnormal results are displayed) Labs Reviewed  CBC WITH DIFFERENTIAL/PLATELET - Abnormal; Notable for the following components:      Result Value   WBC 11.0 (*)    RBC 3.51 (*)    Hemoglobin 10.4 (*)    MCV 107.1 (*)    MCHC 27.7 (*)    Platelets 575 (*)    Neutro Abs 9.0 (*)    Abs Immature Granulocytes 0.17 (*)    All other components within normal limits  COMPREHENSIVE METABOLIC PANEL - Abnormal; Notable for the following components:   Sodium 147 (*)    BUN 39 (*)    Creatinine, Ser 1.88 (*)    Total Protein 6.0 (*)    Albumin 2.3 (*)    AST 12 (*)    GFR, Estimated 25 (*)    All other components within normal limits  URINALYSIS, ROUTINE W REFLEX MICROSCOPIC - Abnormal; Notable for the following components:   APPearance HAZY (*)    Ketones, ur 5 (*)    Protein, ur 30 (*)    Leukocytes,Ua TRACE (*)    Bacteria, UA RARE (*)    All other components within normal limits  AMMONIA - Abnormal; Notable for the following components:   Ammonia 36 (*)    All other components within normal limits  CBG MONITORING, ED - Abnormal; Notable for the following components:   Glucose-Capillary 66 (*)    All other components within  normal limits  CBG MONITORING, ED - Abnormal; Notable for the following components:   Glucose-Capillary 109 (*)    All other components within normal limits  SARS CORONAVIRUS 2 BY RT PCR  CULTURE, BLOOD (ROUTINE X 2)  CULTURE, BLOOD (ROUTINE X 2)  LACTIC ACID, PLASMA  LIPASE, BLOOD  PROCALCITONIN    EKG EKG Interpretation  Date/Time:  Thursday June 13 2021 09:01:07 EDT Ventricular Rate:  76 PR Interval:  138 QRS Duration: 85 QT Interval:  418 QTC Calculation: 470 R Axis:   -17 Text Interpretation: Sinus rhythm Borderline left axis deviation ST elevation, consider inferior injury No significant change since prior 4/23 Confirmed by Aletta Edouard [phone]) on 06/13/2021 9:47:00 AM  Radiology CT CHEST WO CONTRAST  Result Date: 06/13/2021 CLINICAL DATA:  Pneumonia. Complication suspected. Right upper lobe collapse. Progressive decline. Poor p.o. intake and confusion. Approximally 3 weeks since patient has ambulated. Left-sided facial droop. EXAM: CT CHEST WITHOUT CONTRAST; CT ABDOMEN AND PELVIS WITHOUT CONTRAST TECHNIQUE: Multidetector CT imaging of the chest, abdomen, and pelvis was performed following the standard protocol without IV contrast. RADIATION DOSE REDUCTION: This exam was performed according to the departmental dose-optimization program which includes automated exposure control, adjustment of the mA and/or kV according to patient size and/or use of iterative reconstruction technique. COMPARISON:  AP chest 06/13/2021 and 05/26/2021; pelvis and right hip radiographs 05/04/2021; CT chest 11/13/2017 FINDINGS: Cardiovascular: Heart size is mildly enlarged. No pericardial effusion. Moderate to high-grade coronary artery calcifications. No thoracic aortic aneurysm. Moderate atherosclerotic calcifications within the thoracic aorta. Mediastinum/Nodes: No axillary, mediastinal, or hilar pathologically enlarged lymph nodes by CT criteria. Left axillary surgical clips. The right thyroid lobe is  again enlarged. An approximate 2 cm low-density right thyroid lobe nodule is grossly unchanged from prior. No  follow-up imaging recommended. The esophagus follows a normal course of normal caliber. Lungs/Pleura: There is opacification of the distal right mainstem bronchus and portions of the proximal right upper lobe segmental airways. There is high-grade opacification of the posterior right upper lobe with moderate opacification of the anterior and apical segments. There is also partial opacification of multiple portions of the posterolateral right lower lobe segmental airways. Small right pleural effusion. There are heterogeneous ground-glass and centrilobular opacities within the mid to posterior right upper lobe and mid to posterior right lower lobe, presumably infectious/inflammatory process. Mild ground-glass and curvilinear likely subsegmental atelectasis and/or scarring within the posterior left lung. No pneumothorax. Musculoskeletal: Moderate multilevel disc space narrowing and high-grade anterior right bridging osteophytes throughout the thoracic spine. Vertebral body heights are maintained. Severe bilateral glenohumeral osteoarthritis. -- Lack of intra-articular fluid limits evaluation of the abdominal and pelvic organ parenchyma. The following findings are made within this limitation. Hepatobiliary: Smooth liver contours. No gross liver lesion is seen. Tiny dependent gallstone. Pancreas: Grossly unremarkable. No gross pancreatic ductal dilatation or surrounding inflammatory changes. Spleen: Normal in size without focal abnormality. Adrenals/Urinary Tract: Normal adrenals. No renal stone or hydronephrosis. Within the limitations of lack of IV contrast, no contour deforming renal mass. The urinary bladder is only minimally distended, limiting evaluation but grossly unremarkable. Stomach/Bowel: High-grade sigmoid and descending colon and moderate transverse greater than ascending colon diverticulosis with  underdistention and likely chronic mild wall thickening. No definite acute surrounding inflammatory fat stranding. The terminal ileum is unremarkable. The appendix is within normal limits. No dilated loops of bowel to indicate bowel obstruction. Vascular/Lymphatic: No abdominal aortic aneurysm. High-grade atherosclerotic calcifications. No enlarged abdominal or pelvic lymph nodes. Reproductive: The uterus appears to be surgically absent. No gross adnexal abnormality is seen. Other: No abdominal wall hernia. No free air or free fluid. Musculoskeletal: Grade 1 anterolisthesis of L4 on L5. Moderate to severe L2-3 disc space narrowing and degenerative vacuum phenomenon. IMPRESSION: Chest: 1. Distal right mainstem bronchus and portions of the proximal right upper lobe segmental airway opacification with postobstructive atelectasis greatest within the posterior segment of the right upper lobe. This may be secondary to mucosal opacification. There are airspace opacities concerning for pneumonia within the right upper and lower lobes. Small right pleural effusion. It is difficult to exclude the presence of underlying solid pulmonary nodules. Consider follow-up PA and lateral radiographs 6 weeks after treatment to ensure resolution of the right lung findings. 2. Mild cardiomegaly. Abdomen/pelvis: 1. High-grade distal and more moderate proximal diffuse colonic diverticulosis without inflammatory changes to indicate acute diverticulitis. 2. Tiny dependent gallstone. 3. Grade 1 anterolisthesis of L4 on L5, degenerative in etiology without a pars defect visualized. Aortic Atherosclerosis (ICD10-I70.0). Electronically Signed   By: Yvonne Kendall M.D.   On: 06/13/2021 12:06   CT Abdomen Pelvis Wo Contrast  Result Date: 06/13/2021 CLINICAL DATA:  Pneumonia. Complication suspected. Right upper lobe collapse. Progressive decline. Poor p.o. intake and confusion. Approximally 3 weeks since patient has ambulated. Left-sided facial  droop. EXAM: CT CHEST WITHOUT CONTRAST; CT ABDOMEN AND PELVIS WITHOUT CONTRAST TECHNIQUE: Multidetector CT imaging of the chest, abdomen, and pelvis was performed following the standard protocol without IV contrast. RADIATION DOSE REDUCTION: This exam was performed according to the departmental dose-optimization program which includes automated exposure control, adjustment of the mA and/or kV according to patient size and/or use of iterative reconstruction technique. COMPARISON:  AP chest 06/13/2021 and 05/26/2021; pelvis and right hip radiographs 05/04/2021; CT chest 11/13/2017 FINDINGS: Cardiovascular: Heart  size is mildly enlarged. No pericardial effusion. Moderate to high-grade coronary artery calcifications. No thoracic aortic aneurysm. Moderate atherosclerotic calcifications within the thoracic aorta. Mediastinum/Nodes: No axillary, mediastinal, or hilar pathologically enlarged lymph nodes by CT criteria. Left axillary surgical clips. The right thyroid lobe is again enlarged. An approximate 2 cm low-density right thyroid lobe nodule is grossly unchanged from prior. No follow-up imaging recommended. The esophagus follows a normal course of normal caliber. Lungs/Pleura: There is opacification of the distal right mainstem bronchus and portions of the proximal right upper lobe segmental airways. There is high-grade opacification of the posterior right upper lobe with moderate opacification of the anterior and apical segments. There is also partial opacification of multiple portions of the posterolateral right lower lobe segmental airways. Small right pleural effusion. There are heterogeneous ground-glass and centrilobular opacities within the mid to posterior right upper lobe and mid to posterior right lower lobe, presumably infectious/inflammatory process. Mild ground-glass and curvilinear likely subsegmental atelectasis and/or scarring within the posterior left lung. No pneumothorax. Musculoskeletal: Moderate  multilevel disc space narrowing and high-grade anterior right bridging osteophytes throughout the thoracic spine. Vertebral body heights are maintained. Severe bilateral glenohumeral osteoarthritis. -- Lack of intra-articular fluid limits evaluation of the abdominal and pelvic organ parenchyma. The following findings are made within this limitation. Hepatobiliary: Smooth liver contours. No gross liver lesion is seen. Tiny dependent gallstone. Pancreas: Grossly unremarkable. No gross pancreatic ductal dilatation or surrounding inflammatory changes. Spleen: Normal in size without focal abnormality. Adrenals/Urinary Tract: Normal adrenals. No renal stone or hydronephrosis. Within the limitations of lack of IV contrast, no contour deforming renal mass. The urinary bladder is only minimally distended, limiting evaluation but grossly unremarkable. Stomach/Bowel: High-grade sigmoid and descending colon and moderate transverse greater than ascending colon diverticulosis with underdistention and likely chronic mild wall thickening. No definite acute surrounding inflammatory fat stranding. The terminal ileum is unremarkable. The appendix is within normal limits. No dilated loops of bowel to indicate bowel obstruction. Vascular/Lymphatic: No abdominal aortic aneurysm. High-grade atherosclerotic calcifications. No enlarged abdominal or pelvic lymph nodes. Reproductive: The uterus appears to be surgically absent. No gross adnexal abnormality is seen. Other: No abdominal wall hernia. No free air or free fluid. Musculoskeletal: Grade 1 anterolisthesis of L4 on L5. Moderate to severe L2-3 disc space narrowing and degenerative vacuum phenomenon. IMPRESSION: Chest: 1. Distal right mainstem bronchus and portions of the proximal right upper lobe segmental airway opacification with postobstructive atelectasis greatest within the posterior segment of the right upper lobe. This may be secondary to mucosal opacification. There are airspace  opacities concerning for pneumonia within the right upper and lower lobes. Small right pleural effusion. It is difficult to exclude the presence of underlying solid pulmonary nodules. Consider follow-up PA and lateral radiographs 6 weeks after treatment to ensure resolution of the right lung findings. 2. Mild cardiomegaly. Abdomen/pelvis: 1. High-grade distal and more moderate proximal diffuse colonic diverticulosis without inflammatory changes to indicate acute diverticulitis. 2. Tiny dependent gallstone. 3. Grade 1 anterolisthesis of L4 on L5, degenerative in etiology without a pars defect visualized. Aortic Atherosclerosis (ICD10-I70.0). Electronically Signed   By: Yvonne Kendall M.D.   On: 06/13/2021 12:06   CT Head Wo Contrast  Result Date: 06/13/2021 CLINICAL DATA:  Mental status changes, progressive decline, poor p.o. intake, confusion, LEFT facial droop, has not ambulated in 3 weeks EXAM: CT HEAD WITHOUT CONTRAST TECHNIQUE: Contiguous axial images were obtained from the base of the skull through the vertex without intravenous contrast. RADIATION DOSE REDUCTION: This  exam was performed according to the departmental dose-optimization program which includes automated exposure control, adjustment of the mA and/or kV according to patient size and/or use of iterative reconstruction technique. COMPARISON:  05/26/2021 FINDINGS: Brain: Generalized atrophy. Normal ventricular morphology. No midline shift or mass effect. Small vessel chronic ischemic changes of deep cerebral white matter. No intracranial hemorrhage, mass lesion, evidence of acute infarction, or extra-axial fluid collection. Vascular: Atherosclerotic calcification of internal carotid arteries at skull base Skull: Intact.  Hyperostosis frontalis interna. Sinuses/Orbits: Clear Other: N/A IMPRESSION: Atrophy with small vessel chronic ischemic changes of deep cerebral white matter. No acute intracranial abnormalities. Electronically Signed   By: Lavonia Dana M.D.   On: 06/13/2021 09:46   DG Chest Port 1 View  Result Date: 06/13/2021 CLINICAL DATA:  Weakness.  Increased failure to thrive.  Confusion. EXAM: PORTABLE CHEST 1 VIEW COMPARISON:  AP chest 05/26/2021 FINDINGS: Cardiac silhouette is at the upper limits of normal size for AP technique. Mediastinal contours are grossly within normal limits. Calcification is seen within the aortic arch. There is right-sided volume loss and homogeneous opacification within the superomedial right hemithorax indicating right upper lobe collapse. There is mild rightward deviation of the trachea due to the volume loss. The left lung is clear. No pleural effusion is seen. No pneumothorax. Moderate multilevel degenerative disc space narrowing and endplate osteophytes of the thoracic spine. IMPRESSION: 1. New right upper lobe collapse with associated volume loss and mild rightward mediastinal shift. 2. The left lung is clear. Electronically Signed   By: Yvonne Kendall M.D.   On: 06/13/2021 09:19    Procedures .Critical Care  Performed by: Hayden Rasmussen, MD Authorized by: Hayden Rasmussen, MD   Critical care provider statement:    Critical care time (minutes):  45   Critical care time was exclusive of:  Separately billable procedures and treating other patients   Critical care was necessary to treat or prevent imminent or life-threatening deterioration of the following conditions:  CNS failure or compromise and respiratory failure   Critical care was time spent personally by me on the following activities:  Development of treatment plan with patient or surrogate, discussions with consultants, evaluation of patient's response to treatment, examination of patient, obtaining history from patient or surrogate, ordering and performing treatments and interventions, ordering and review of laboratory studies, ordering and review of radiographic studies, pulse oximetry, re-evaluation of patient's condition and review of old  charts   I assumed direction of critical care for this patient from another provider in my specialty: no       Medications Ordered in ED Medications  dextrose 5 % in lactated ringers infusion ( Intravenous New Bag/Given 06/13/21 1335)  sodium chloride 0.9 % bolus 1,000 mL (0 mLs Intravenous Stopped 06/13/21 1129)  dextrose 50 % solution 12.5 g (12.5 g Intravenous Given 06/13/21 0947)  cefTRIAXone (ROCEPHIN) 1 g in sodium chloride 0.9 % 100 mL IVPB (0 g Intravenous Stopped 06/13/21 1618)  azithromycin (ZITHROMAX) 500 mg in sodium chloride 0.9 % 250 mL IVPB (0 mg Intravenous Stopped 06/13/21 1618)    ED Course/ Medical Decision Making/ A&P Clinical Course as of 06/13/21 1910  Thu Jun 13, 2021  0921 Chest x-ray interpreted by me as right upper lobe density unable to clarify whether this is a pneumo hydrothorax versus lobar collapse.  Awaiting radiology reading. [MB]  1610 Fingerstick borderline low at 66.  Will give half amp of D50.  Continue observe. [MB]  9604 CT head does  not show any acute findings.  Awaiting radiology reading. [MB]  1059 I updated the patient's granddaughter with current labs back and imaging.  I let her know that I have a phone call to the hospitalist for admission.  We will also need to discuss whether further imaging needed at this time. [MB]  1106 Discussed with [facility] hospitalist Dr. Marylyn Ishihara.  He asked if we could order the chest and abdominal CT.  He is going to also get a procalcitonin level to see if antibiotics are indicated.  He will evaluate patient for admission. [MB]    Clinical Course User Index [MB] Hayden Rasmussen, MD                           Medical Decision Making Amount and/or Complexity of Data Reviewed Labs: ordered. Radiology: ordered.  Risk Prescription drug management. Decision regarding hospitalization.  This patient complains of weakness failure to thrive difficulty ambulating; this involves an extensive number of treatment Options and is a  complaint that carries with it a high risk of complications and morbidity. The differential includes stroke, bleed, metabolic derangement, dehydration, renal failure, infection including pneumonia or UTI  I ordered, reviewed and interpreted labs, which included CBC with elevated white count, hemoglobin low stable from priors, chemistries with elevated sodium elevated BUN and creatinine reflecting some dehydration, lactate normal, COVID-negative, procalcitonin mildly elevated, urinalysis without clear signs of infection I ordered medication IV fluids IV antibiotics IV dextrose and reviewed PMP when indicated. I ordered imaging studies which included chest x-ray, CT chest, CT head, CT abdomen and pelvis and I independently    visualized and interpreted imaging which showed right upper lobe collapse and possible infiltrate Additional history obtained from patient's daughter and granddaughter, EMS Previous records obtained and reviewed in epic, was seen here last month for a fall with negative imaging I consulted Dr. Marylyn Ishihara [facility] hospital[facility] and discussed lab and imaging findings and discussed disposition.  Cardiac monitoring reviewed, normal sinus rhythm Social determinants considered, no significant barriers Critical Interventions: Initiation of broad-spectrum antibiotics for pneumonia altered mental status  After the interventions stated above, I reevaluated the patient and found patient still be very weak and tired appearing Admission and further testing considered, patient benefit from admission for IV antibiotics and further work-up of her symptoms.  Family in agreement with plan.          Final Clinical Impression(s) / ED Diagnoses Final diagnoses:  Generalized weakness  Multifocal pneumonia  AKI (acute kidney injury) [facility])    Rx / DC Orders ED Discharge Orders     None         Hayden Rasmussen, MD 06/13/21 1913

## 2021-06-13 NOTE — H&P (Signed)
History and Physical    Patient: Tiffany Hartman DOB: [birth year] DOA: 06/13/2021 DOS: the patient was seen and examined on 06/13/2021 PCP: Carrolyn Meiers, MD  Patient coming from: Home  Chief Complaint:  Chief Complaint  Patient presents with   Weakness   HPI: Tiffany Hartman is a 88 y.o. female with medical history significant of HTN, HLD, DM2, MGUS, CKD3b, chronic hypoxic respiratory failure on 2L [place], CVA. Presenting with weakness, lethargy. History from granddaughter at bedside. She reports that the patient has not eaten much at all for the last couple of weeks. She has become immobile during that time. She is not as interactive as her normal self. She has been very somnolent during this time. Her granddaughter also reports cough and congestion over the last week. The patient has not had any therapy for it. She has not have any fevers, N/V/D. She has not had any other complaints. When her condition did not improve this morning, her family decided to call EMS.   Review of Systems: unable to review all systems due to the inability of the patient to answer questions. Past Medical History:  Diagnosis Date   Asthma    Breast cancer ([place]) 02/20/83   LT mastectomy   Bronchitis    Dementia ([place])    Diabetes mellitus ([place])    Diverticulitis 2009   Glaucoma    HTN (hypertension)    OA (osteoarthritis)    Obesity 08/17/2014   Parkinson disease ([place])    Stroke [facility])    Thyroid nodule    Dr Legrand Rams   Past Surgical History:  Procedure Laterality Date    bilateral catracts     ABDOMINAL HYSTERECTOMY     complete   APPENDECTOMY     EUS  09/10/2011   Procedure: UPPER ENDOSCOPIC ULTRASOUND (EUS) RADIAL;  Surgeon: Arta Silence, MD;  Location: [facility] ENDOSCOPY;  Service: Endoscopy;  Laterality: N/A;  christina/ebp   FINE NEEDLE ASPIRATION  09/10/2011   Procedure: FINE NEEDLE ASPIRATION (FNA) LINEAR;  Surgeon: Arta Silence, MD;  Location: [facility] ENDOSCOPY;  Service: Endoscopy;  Laterality:  N/A;   FRACTURE SURGERY  2008    right and left femurs   JOINT REPLACEMENT  1998/1999   knee boths   KNEE SURGERY Right    TKA   KNEE SURGERY Left    TKA   KNEE SURGERY Right    Periprosthetic fracture /otif   MASTECTOMY  02/20/1983   left    PARS PLANA VITRECTOMY Right 06/06/2016   Procedure: VITRECTOMY WITH CULTURES AND INJECTION OF ANTIBIOTICS; ANTERIOR CHAMBER WASHOUT;  Surgeon: Jalene Mullet, MD;  Location: [facility];  Service: Ophthalmology;  Laterality: Right;   Social History:  reports that she has never smoked. She has never used smokeless tobacco. She reports that she does not drink alcohol and does not use drugs.  No Known Allergies  Family History  Problem Relation Age of Onset   Huntington's disease Mother    Stroke Father    Colon polyps Daughter 80   Throat cancer Son     Prior to Admission medications   Medication Sig Start Date End Date Taking? Authorizing Provider  albuterol (PROVENTIL HFA;VENTOLIN HFA) 108 (90 Base) MCG/ACT inhaler Inhale 2 puffs into the lungs every 6 (six) hours as needed for wheezing or shortness of breath. 03/01/18   Gerlene Fee, NP  ALPRAZolam Duanne Moron) 0.25 MG tablet Take 1 tablet (0.25 mg total) by mouth 3 (three) times daily as needed for anxiety or  sleep. 03/01/18   Gerlene Fee, NP  amLODipine (NORVASC) 5 MG tablet Take 1 tablet (5 mg total) by mouth daily. 03/01/18   Gerlene Fee, NP  Calcium Carbonate-Vitamin D 600-200 MG-UNIT TABS Take 1 tablet by mouth every evening.     [provider]  clopidogrel (PLAVIX) 75 MG tablet Take 1 tablet (75 mg total) by mouth daily with breakfast. 03/01/18   Gerlene Fee, NP  docusate sodium (COLACE) 100 MG capsule Take 100 mg by mouth 2 (two) times daily as needed for mild constipation. 02/23/18   [provider]  donepezil (ARICEPT) 10 MG tablet Take 1 tablet (10 mg total) by mouth at bedtime. 03/01/18   Gerlene Fee, NP  ferrous gluconate (FERGON) 324 MG tablet Take 1  tablet (324 mg total) by mouth daily with breakfast. 03/01/18   Gerlene Fee, NP  FLUoxetine (PROZAC) 20 MG capsule Take 1 capsule (20 mg total) by mouth daily. 03/01/18   Gerlene Fee, NP  folic acid (FOLVITE) 1 MG tablet Take 1 tablet (1 mg total) by mouth daily. 03/01/18   Gerlene Fee, NP  furosemide (LASIX) 20 MG tablet Take 20 mg by mouth daily.    [provider]  gabapentin (NEURONTIN) 300 MG capsule Take 1 capsule (300 mg total) by mouth 3 (three) times daily. 03/01/18   Gerlene Fee, NP  glipiZIDE (GLUCOTROL XL) 2.5 MG 24 hr tablet Take 2.5 mg by mouth daily. 03/15/19   [provider]  HYDROcodone-acetaminophen (NORCO/VICODIN) 5-325 MG tablet Take 1 tablet by mouth every 6 (six) hours as needed for moderate pain. 10/27/19   Carole Civil, MD  ipratropium-albuterol (DUONEB) 0.5-2.5 (3) MG/3ML SOLN Take 3 mLs by nebulization 2 (two) times daily. And every 6 hours as needed 03/01/18   Gerlene Fee, NP  KLOR-CON M20 20 MEQ tablet Take 20 mEq by mouth daily. 04/06/19   [provider]  latanoprost (XALATAN) 0.005 % ophthalmic solution Place 1 drop into both eyes at bedtime. 03/01/18   Gerlene Fee, NP  levETIRAcetam (KEPPRA) 250 MG tablet Take 1 tablet (250 mg total) by mouth 2 (two) times daily. 03/01/18   Gerlene Fee, NP  losartan (COZAAR) 25 MG tablet Take 50 mg by mouth daily. 02/01/19   [provider]  lovastatin (MEVACOR) 10 MG tablet Take 1 tablet (10 mg total) by mouth at bedtime. 03/01/18   Gerlene Fee, NP  NON FORMULARY Diet Type:  NAS 02/16/18   [provider]  omeprazole (PRILOSEC) 40 MG capsule Take 40 mg by mouth daily. 04/25/19   [provider]  oxyCODONE-acetaminophen (PERCOCET) 5-325 MG tablet Take 1 pain medicine every 6 hours for pain not relieved by Tylenol or Motrin 05/04/21   Milton Ferguson, MD  OXYGEN Inhale 2 L/min into the lungs continuous. Titrate as needed to maintain satuation > 90%  02/16/18   [provider]  polyethylene glycol (MIRALAX / GLYCOLAX) packet Take 17 g by mouth daily as needed for mild constipation. 02/23/18   [provider]  timolol (BETIMOL) 0.5 % ophthalmic solution Place 1 drop into the left eye daily. 03/01/18   Gerlene Fee, NP    Physical Exam: Vitals:   06/13/21 0851 06/13/21 0900 06/13/21 0915 06/13/21 1102  BP: (!) 121/55 (!) 118/53 (!) 125/56 (!) 137/48  Pulse: 76 72 75 74  Resp: '14 13 17 16  '$ Temp: (!) 97.5 F (36.4 C)     TempSrc:  Oral     SpO2: 96% 96% 99% 94%   General: 88 y.o. female resting in bed in NAD Eyes: PERRL, normal sclera ENMT: Nares patent w/o discharge, orophaynx clear, dentition normal, ears w/o discharge/lesions/ulcers Neck: Supple, trachea midline Cardiovascular: RRR, +S1, S2, no m/g/r, equal pulses throughout Respiratory: CTABL, no w/r/r, normal WOB GI: BS+, NDNT, no masses noted, no organomegaly noted MSK: No e/c/c Neuro: limited exam d/t somnolence  Data Reviewed:  Na+  147 K+  3.6 BUN  39 Scr  1.88 WBC  11.0  CTH Atrophy with small vessel chronic ischemic changes of deep cerebral white matter. No acute intracranial abnormalities.  CT Chest/ab/pelvis Chest: 1. Distal right mainstem bronchus and portions of the proximal right upper lobe segmental airway opacification with postobstructive atelectasis greatest within the posterior segment of the right upper lobe. This may be secondary to mucosal opacification. There are airspace opacities concerning for pneumonia within the right upper and lower lobes. Small right pleural effusion. It is difficult to exclude the presence of underlying solid pulmonary nodules. Consider follow-up PA and lateral radiographs 6 weeks after treatment to ensure resolution of the right lung findings. 2. Mild cardiomegaly.   Abdomen/pelvis: 1. High-grade distal and more moderate proximal diffuse colonic diverticulosis without inflammatory changes to  indicate acute diverticulitis. 2. Tiny dependent gallstone. 3. Grade 1 anterolisthesis of L4 on L5, degenerative in etiology without a pars defect visualized.  Assessment and Plan: Multifocal PNA Right mainstem obstruction Acute on chronic hypoxic respiratory failure COPD     - admit to inpt, tele     - continue CAP coverage     - check urine legionella, strep     - nebs, physiotherapy     - COVID negative     - have asked PCCM to weigh in on her imaging, appreciate assistance  Failure to Thrive Generalized weakness/lethargy     - dietitian consult     - PT/OT consult     - CTH is negative  HTN     - normotensive     - hold for today; resume in AM after BP assessment  DM2     - she's hypoglycemic right now; holding home regimen     - encourage diet     - she will get some D5LR for right now     - A1c, glucose checks  AKI on CKD3b     - fluids, watch nephrotoxins     - baseline is about the 1.3 - 1.4 range     - no obstruction seen on CT  MGUS     - continue outpt follow up  HLD     - continue statin  Anxiety/depression     - continue home regimen  Glaucoma     - continue home regimen  Advance Care Planning:   Code Status: DNR confirmed w/ daughter by phone, granddaughter at bedside  Consults: PCCM  Family Communication: w/ granddaughter at bedside  Severity of Illness: The appropriate patient status for this patient is OBSERVATION. Observation status is judged to be reasonable and necessary in order to provide the required intensity of service to ensure the patient's safety. The patient's presenting symptoms, physical exam findings, and initial radiographic and laboratory data in the context of their medical condition is felt to place them at decreased risk for further clinical deterioration. Furthermore, it is anticipated that the patient will be medically stable for discharge from the hospital within 2 midnights of admission.   Author:  Jonnie Finner,  DO 06/13/2021 11:07 AM  For on call review [email or web address].

## 2021-06-13 NOTE — ED Notes (Signed)
Attempted to give pt fluids. Pt unable to take fluids from straw. Failed swallow screen. Hospitalist NP notified.

## 2021-06-13 NOTE — ED Triage Notes (Signed)
Pt bib [facility] from home d/t progressive decline. Family reports poor po intake, confusion. Per family its been approximately 3 weeks since pt has ambulated. Left sided facial droop. Pt can communicate, answers questions appropriately.

## 2021-06-14 DIAGNOSIS — J431 Panlobular emphysema: Secondary | ICD-10-CM

## 2021-06-14 DIAGNOSIS — I10 Essential (primary) hypertension: Secondary | ICD-10-CM

## 2021-06-14 DIAGNOSIS — N179 Acute kidney failure, unspecified: Secondary | ICD-10-CM | POA: Diagnosis not present

## 2021-06-14 DIAGNOSIS — B370 Candidal stomatitis: Secondary | ICD-10-CM | POA: Diagnosis present

## 2021-06-14 DIAGNOSIS — K143 Hypertrophy of tongue papillae: Secondary | ICD-10-CM | POA: Diagnosis present

## 2021-06-14 DIAGNOSIS — R531 Weakness: Secondary | ICD-10-CM | POA: Diagnosis not present

## 2021-06-14 DIAGNOSIS — J9621 Acute and chronic respiratory failure with hypoxia: Secondary | ICD-10-CM

## 2021-06-14 DIAGNOSIS — N1832 Chronic kidney disease, stage 3b: Secondary | ICD-10-CM

## 2021-06-14 DIAGNOSIS — A419 Sepsis, unspecified organism: Secondary | ICD-10-CM | POA: Diagnosis present

## 2021-06-14 DIAGNOSIS — J189 Pneumonia, unspecified organism: Secondary | ICD-10-CM | POA: Diagnosis not present

## 2021-06-14 DIAGNOSIS — D472 Monoclonal gammopathy: Secondary | ICD-10-CM

## 2021-06-14 DIAGNOSIS — R627 Adult failure to thrive: Secondary | ICD-10-CM

## 2021-06-14 DIAGNOSIS — R6521 Severe sepsis with septic shock: Secondary | ICD-10-CM

## 2021-06-14 LAB — RESPIRATORY PANEL BY PCR

## 2021-06-14 LAB — BLOOD GAS, ARTERIAL
Acid-Base Excess: 6.6 mmol/L — ABNORMAL HIGH (ref 0.0–2.0)
Acid-Base Excess: 9 mmol/L — ABNORMAL HIGH (ref 0.0–2.0)
Bicarbonate: 36.8 mmol/L — ABNORMAL HIGH (ref 20.0–28.0)
Bicarbonate: 35.8 mmol/L — ABNORMAL HIGH (ref 20.0–28.0)
Delivery systems: POSITIVE
Drawn by: 42261
Expiratory PAP: 5 cmH2O
FIO2: 40 %
Inspiratory PAP: 22 cmH2O
O2 Saturation: 98.6 %
O2 Saturation: 99 %
Patient temperature: 36.3
Patient temperature: 37
pCO2 arterial: 80 mmHg (ref 32–48)
pCO2 arterial: 62 mmHg — ABNORMAL HIGH (ref 32–48)
pH, Arterial: 7.27 — ABNORMAL LOW (ref 7.35–7.45)
pH, Arterial: 7.37 (ref 7.35–7.45)
pO2, Arterial: 81 mmHg — ABNORMAL LOW (ref 83–108)
pO2, Arterial: 74 mmHg — ABNORMAL LOW (ref 83–108)

## 2021-06-14 LAB — HEMOGLOBIN A1C
Hgb A1c MFr Bld: 5.6 % (ref 4.8–5.6)
Mean Plasma Glucose: 114.02 mg/dL

## 2021-06-14 LAB — COMPREHENSIVE METABOLIC PANEL
ALT: 8 U/L (ref 0–44)
AST: 9 U/L — ABNORMAL LOW (ref 15–41)
Albumin: 2.2 g/dL — ABNORMAL LOW (ref 3.5–5.0)
Alkaline Phosphatase: 50 U/L (ref 38–126)
Anion gap: 9 (ref 5–15)
BUN: 36 mg/dL — ABNORMAL HIGH (ref 8–23)
CO2: 31 mmol/L (ref 22–32)
Calcium: 9.1 mg/dL (ref 8.9–10.3)
Chloride: 106 mmol/L (ref 98–111)
Creatinine, Ser: 1.6 mg/dL — ABNORMAL HIGH (ref 0.44–1.00)
GFR, Estimated: 30 mL/min — ABNORMAL LOW (ref >60)
Glucose, Bld: 276 mg/dL — ABNORMAL HIGH (ref 70–99)
Potassium: 3.3 mmol/L — ABNORMAL LOW (ref 3.5–5.1)
Sodium: 146 mmol/L — ABNORMAL HIGH (ref 135–145)
Total Bilirubin: 0.6 mg/dL (ref 0.3–1.2)
Total Protein: 5.5 g/dL — ABNORMAL LOW (ref 6.5–8.1)

## 2021-06-14 LAB — GLUCOSE, CAPILLARY
Glucose-Capillary: 103 mg/dL — ABNORMAL HIGH (ref 70–99)
Glucose-Capillary: 122 mg/dL — ABNORMAL HIGH (ref 70–99)
Glucose-Capillary: 130 mg/dL — ABNORMAL HIGH (ref 70–99)
Glucose-Capillary: 191 mg/dL — ABNORMAL HIGH (ref 70–99)
Glucose-Capillary: 225 mg/dL — ABNORMAL HIGH (ref 70–99)
Glucose-Capillary: 226 mg/dL — ABNORMAL HIGH (ref 70–99)
Glucose-Capillary: 247 mg/dL — ABNORMAL HIGH (ref 70–99)
Glucose-Capillary: 271 mg/dL — ABNORMAL HIGH (ref 70–99)

## 2021-06-14 LAB — CBC
HCT: 35 % — ABNORMAL LOW (ref 36.0–46.0)
Hemoglobin: 9.7 g/dL — ABNORMAL LOW (ref 12.0–15.0)
MCH: 29.7 pg (ref 26.0–34.0)
MCHC: 27.7 g/dL — ABNORMAL LOW (ref 30.0–36.0)
MCV: 107 fL — ABNORMAL HIGH (ref 80.0–100.0)
Platelets: 496 10*3/uL — ABNORMAL HIGH (ref 150–400)
RBC: 3.27 MIL/uL — ABNORMAL LOW (ref 3.87–5.11)
RDW: 13.1 % (ref 11.5–15.5)
WBC: 9.7 10*3/uL (ref 4.0–10.5)
nRBC: 0.2 % (ref 0.0–0.2)

## 2021-06-14 LAB — MRSA NEXT GEN BY PCR, NASAL: MRSA by PCR Next Gen: NOT DETECTED

## 2021-06-14 LAB — STREP PNEUMONIAE URINARY ANTIGEN: Strep Pneumo Urinary Antigen: NEGATIVE

## 2021-06-14 MED ORDER — POTASSIUM CHLORIDE 10 MEQ/100ML IV SOLN
10.0000 meq | INTRAVENOUS | Status: AC
  Administered 2021-06-14 (×3): 10 meq via INTRAVENOUS
  Filled 2021-06-14 (×3): qty 100

## 2021-06-14 MED ORDER — INSULIN DETEMIR 100 UNIT/ML ~~LOC~~ SOLN
8.0000 [IU] | Freq: Every day | SUBCUTANEOUS | Status: DC
  Administered 2021-06-14 – 2021-06-15 (×2): 8 [IU] via SUBCUTANEOUS
  Filled 2021-06-14 (×3): qty 0.08

## 2021-06-14 MED ORDER — HEPARIN SODIUM (PORCINE) 5000 UNIT/ML IJ SOLN
5000.0000 [IU] | Freq: Three times a day (TID) | INTRAMUSCULAR | Status: DC
  Administered 2021-06-14 – 2021-06-21 (×20): 5000 [IU] via SUBCUTANEOUS
  Filled 2021-06-14 (×20): qty 1

## 2021-06-14 MED ORDER — CHLORHEXIDINE GLUCONATE 0.12 % MT SOLN
15.0000 mL | Freq: Two times a day (BID) | OROMUCOSAL | Status: DC
  Administered 2021-06-14 – 2021-06-16 (×7): 15 mL via OROMUCOSAL
  Filled 2021-06-14 (×7): qty 15

## 2021-06-14 MED ORDER — CHLORHEXIDINE GLUCONATE CLOTH 2 % EX PADS
6.0000 | MEDICATED_PAD | Freq: Every day | CUTANEOUS | Status: DC
  Administered 2021-06-14 – 2021-06-20 (×8): 6 via TOPICAL

## 2021-06-14 MED ORDER — ORAL CARE MOUTH RINSE
15.0000 mL | Freq: Two times a day (BID) | OROMUCOSAL | Status: DC
  Administered 2021-06-14 – 2021-06-16 (×6): 15 mL via OROMUCOSAL

## 2021-06-14 MED ORDER — ALBUMIN HUMAN 25 % IV SOLN
50.0000 g | Freq: Once | INTRAVENOUS | Status: AC
  Administered 2021-06-14: 50 g via INTRAVENOUS
  Filled 2021-06-14: qty 200

## 2021-06-14 MED ORDER — HYDROCORTISONE SOD SUC (PF) 100 MG IJ SOLR
100.0000 mg | Freq: Three times a day (TID) | INTRAMUSCULAR | Status: DC
  Administered 2021-06-14 – 2021-06-17 (×8): 100 mg via INTRAVENOUS
  Filled 2021-06-14 (×8): qty 2

## 2021-06-14 MED ORDER — POTASSIUM CHLORIDE 10 MEQ/100ML IV SOLN
10.0000 meq | INTRAVENOUS | Status: AC
  Administered 2021-06-14 (×2): 10 meq via INTRAVENOUS
  Filled 2021-06-14 (×2): qty 100

## 2021-06-14 MED ORDER — IPRATROPIUM-ALBUTEROL 0.5-2.5 (3) MG/3ML IN SOLN
3.0000 mL | Freq: Four times a day (QID) | RESPIRATORY_TRACT | Status: DC
  Administered 2021-06-14 – 2021-06-18 (×17): 3 mL via RESPIRATORY_TRACT
  Filled 2021-06-14 (×17): qty 3

## 2021-06-14 MED ORDER — SODIUM CHLORIDE 0.9 % IV SOLN: INTRAVENOUS | Status: DC | PRN

## 2021-06-14 MED ORDER — LIP MEDEX EX OINT
TOPICAL_OINTMENT | CUTANEOUS | Status: DC | PRN
  Administered 2021-06-15 – 2021-06-19 (×2): 1 via TOPICAL
  Filled 2021-06-14: qty 7

## 2021-06-14 MED ORDER — INSULIN ASPART 100 UNIT/ML IJ SOLN
0.0000 [IU] | INTRAMUSCULAR | Status: DC
  Administered 2021-06-14 (×2): 1 [IU] via SUBCUTANEOUS
  Administered 2021-06-14: 2 [IU] via SUBCUTANEOUS
  Administered 2021-06-14: 3 [IU] via SUBCUTANEOUS
  Administered 2021-06-15: 1 [IU] via SUBCUTANEOUS
  Administered 2021-06-15: 2 [IU] via SUBCUTANEOUS
  Administered 2021-06-15: 1 [IU] via SUBCUTANEOUS
  Administered 2021-06-15: 3 [IU] via SUBCUTANEOUS
  Administered 2021-06-15 – 2021-06-16 (×4): 2 [IU] via SUBCUTANEOUS
  Administered 2021-06-16 (×2): 3 [IU] via SUBCUTANEOUS
  Administered 2021-06-16 – 2021-06-17 (×2): 2 [IU] via SUBCUTANEOUS
  Administered 2021-06-17: 3 [IU] via SUBCUTANEOUS
  Administered 2021-06-17: 1 [IU] via SUBCUTANEOUS

## 2021-06-14 MED ORDER — FLUCONAZOLE IN SODIUM CHLORIDE 200-0.9 MG/100ML-% IV SOLN
200.0000 mg | Freq: Once | INTRAVENOUS | Status: AC
  Administered 2021-06-14: 200 mg via INTRAVENOUS
  Filled 2021-06-14: qty 100

## 2021-06-14 MED ORDER — POTASSIUM CHLORIDE 10 MEQ/100ML IV SOLN: 10.0000 meq | INTRAVENOUS | Status: DC

## 2021-06-14 MED ORDER — FLUCONAZOLE 100MG IVPB
100.0000 mg | INTRAVENOUS | Status: DC
  Administered 2021-06-15 – 2021-06-19 (×5): 100 mg via INTRAVENOUS
  Filled 2021-06-14 (×8): qty 50

## 2021-06-14 NOTE — Progress Notes (Signed)
Brief Pharmacy Note: Subq heparin dosing for VTE Prophylaxis   Provider consulted pharmacy to dose subq heparin for VTE prophylaxis for this 88 y.o. BF with PMHx  HTN, HLD, DM2, MGUS, CKD3b, chronic hypoxic respiratory failure on 2L [place], CVA, Parkinson's disease, and dementia.  Serum creatinine elevated at 1.6.   CBC: hemoglobin low at 9.7 and platelets elevated at 496.     Plan: Heparin 5000 units subq every 8 hours Need for further dosage adjustment unlikely and pharmacy will sign off.  Please reconsult if a change in clinical status warrants re-evaluation of dosage.    Royetta Asal, PharmD, BCPS Clinical Pharmacist [place] Please utilize Amion for appropriate phone number to reach the unit pharmacist ([place]) 06/14/2021 7:31 PM

## 2021-06-14 NOTE — Progress Notes (Signed)
PROGRESS NOTE    Tiffany Hartman  HLK:[id number] DOB: 06-Nov-1927 DOA: 06/13/2021 PCP: Carrolyn Meiers, MD     Brief Narrative:  88 y.o. BF PMHx  HTN, HLD, DM2, MGUS, CKD3b, chronic hypoxic respiratory failure on 2L [place], CVA.  Parkinson's disease, dementia  Presenting with weakness, lethargy. History from granddaughter at bedside. She reports that the patient has not eaten much at all for the last couple of weeks. She has become immobile during that time. She is not as interactive as her normal self. She has been very somnolent during this time. Her granddaughter also reports cough and congestion over the last week. The patient has not had any therapy for it. She has not have any fevers, N/V/D. She has not had any other complaints. When her condition did not improve this morning, her family decided to call EMS.    Subjective: 6/9 hypothermic overnight T min 35.6 C.Somnolent, on BiPAP.  Will open her eyes to sternal rub,   Assessment & Plan: Covid vaccination;   Principal Problem:   Multifocal pneumonia Active Problems:   Diabetes ([place])   COPD (chronic obstructive pulmonary disease) (HCC)   GERD without esophagitis   Dyslipidemia   Depression with anxiety   Acute on chronic respiratory failure with hypoxia (HCC)   Stage 3b chronic kidney disease (CKD) (HCC)   MGUS (monoclonal gammopathy of unknown significance)   Glaucoma   Generalized weakness   Failure to thrive in adult   HTN (hypertension), benign   Oropharyngeal candidiasis   Black hairy tongue  Septic shock - On admission<36 C, RR> 20, hypotensive not responsive to fluid resuscitation, -See pneumonia  Multifocal PNA/Right mainstem obstruction/CAP -6/9 patient with very labile BP place arterial line - 6/9 Solu-Cortef '100mg'$  TID (stress dose) - 6/9 continue antibiotics x7 days - DuoNeb QID -BiPAP as required -Titrate O2 to maintain SPO2 89 to 95% - Incentive spirometry - Flutter valve -6/10 ABG pending  COPD   -See pneumonia  Acute on Chronic Hypoxic Respiratory Failure -See pneumonia  Oropharyngeal candidiasis - Diflucan IV 200 mg x 1 then 100 mg x 14 days  Black Hairy tongue syndrome -No treatment required.   Failure to Thrive/generalized weakness/lethargy     - dietitian consult     - PT/OT consult     - CTH is negative   HTN/Hypotensive/Septic Shock -6/9 hold all BP medication -6/9 currently hypotensive -6/9 Albumin 50 g x 1 - 6/9 awaiting placement of arterial line, if BP actually true after fluid resuscitation and albumin we will start pressors   DM type II controlled with hyperglycemia - 6/9 hemoglobin A1c= 5.6 -6/9 Levemir 8 units daily -6/9 sensitive SSI       AKI on CKD stage 3b (baseline Cr 1.3--1.4) Lab Results  Component Value Date   CREATININE 1.60 (H) 06/14/2021   CREATININE 1.88 (H) 06/13/2021   CREATININE 1.24 (H) 05/04/2021   CREATININE 1.24 (H) 07/03/2018   CREATININE 1.13 (H) 02/25/2018  -Hold all nephrotoxins - CT: No obstruction       MGUS     - continue outpt follow up   HLD     - continue statin   Anxiety/depression     - continue home regimen   Glaucoma     - continue home regimen  Hypokalemia - Potassium goal> 4 - 6/9 Potassium IV 50 mEq  Obesity BMI 42.7 kg/m. -       Mobility Assessment (last 72 hours)     Mobility Assessment  Dane Name 06/13/21 2230           Does patient have an order for bedrest or is patient medically unstable Yes- Bedfast (Level 1) - Complete                  Interdisciplinary Goals of Care Family Meeting   Date carried out: 06/14/2021  Location of the meeting:   Member's involved:   Durable Power of Tour manager:     Discussion: We discussed goals of care for [employer] .    Code status:   Disposition:   Time spent for the meeting:     Jeromie Gainor J, MD  06/14/2021, 10:13 AM         DVT prophylaxis: Subcu heparin Code Status:  DNR Family Communication: 6/9 granddaughter at bedside for discussion of plan of care all questions answered Status is: Inpatient    Dispo: The patient is from: Home              Anticipated d/c is to: Home              Anticipated d/c date is: > 3 days              Patient currently is not medically stable to d/c.      Consultants:  PCCM  Procedures/Significant Events:    I have personally reviewed and interpreted all radiology studies and my findings are as above.  VENTILATOR SETTINGS: BiPAP 6/9 RR 22 IPAP 20 EPAP 5 O2 25% SPO2 100%   Cultures 6/8 strep pneumo urine antigen pending 6/8 Legionella urine antigen pending 6/8 SARS coronavirus negative 6/8 influenza A/B negative 6/9 sputum pending 6/9 respiratory virus panel negative   Antimicrobials: Anti-infectives (From admission, onward)    Start     Dose/Rate Route Frequency Ordered Stop   06/15/21 1000  fluconazole (DIFLUCAN) IVPB 100 mg        100 mg 50 mL/hr over 60 Minutes Intravenous Every 24 hours 06/14/21 1010     06/14/21 1100  fluconazole (DIFLUCAN) IVPB 200 mg        200 mg 100 mL/hr over 60 Minutes Intravenous  Once 06/14/21 1010 06/14/21 1309   06/14/21 1000  cefTRIAXone (ROCEPHIN) 2 g in sodium chloride 0.9 % 100 mL IVPB        2 g 200 mL/hr over 30 Minutes Intravenous Every 24 hours 06/13/21 2010 06/19/21 0959   06/14/21 1000  azithromycin (ZITHROMAX) 500 mg in sodium chloride 0.9 % 250 mL IVPB        500 mg 250 mL/hr over 60 Minutes Intravenous Every 24 hours 06/13/21 2010 06/19/21 0959   06/13/21 1215  cefTRIAXone (ROCEPHIN) 1 g in sodium chloride 0.9 % 100 mL IVPB        1 g 200 mL/hr over 30 Minutes Intravenous  Once 06/13/21 1211 06/13/21 1618   06/13/21 1215  azithromycin (ZITHROMAX) 500 mg in sodium chloride 0.9 % 250 mL IVPB        500 mg 250 mL/hr over 60 Minutes Intravenous  Once 06/13/21 1211 06/13/21 1618         Devices    LINES / TUBES:      Continuous  Infusions:  albumin human     azithromycin     cefTRIAXone (ROCEPHIN)  IV Stopped (06/14/21 1001)   dextrose 5% lactated ringers 50 mL/hr at 06/14/21 1003   [START ON 06/15/2021] fluconazole (DIFLUCAN) IV  fluconazole (DIFLUCAN) IV       Objective: Vitals:   06/14/21 0758 06/14/21 0800 06/14/21 0809 06/14/21 0856  BP:  (!) 105/38    Pulse:  (!) 53  61  Resp:      Temp:   (!) 97.2 F (36.2 C)   TempSrc:   Axillary   SpO2: 100%   99%  Weight:        Intake/Output Summary (Last 24 hours) at 06/14/2021 1013 Last data filed at 06/14/2021 1003 Gross per 24 hour  Intake 3282.49 ml  Output 100 ml  Net 3182.49 ml   Filed Weights   06/13/21 2233  Weight: 102.5 kg    Examination:  General: Somnolent, on BiPAP.  Will open her eyes to sternal rub, nods her head yes and no to questions.  Positive acute respiratory distress Eyes: negative scleral hemorrhage, negative anisocoria, negative icterus ENT: Negative Runny nose, negative gingival bleeding, Neck:  Negative scars, masses, torticollis, lymphadenopathy, JVD Lungs: decreased breath sounds bilaterally without wheezes or crackles Cardiovascular: Regular rate and rhythm without murmur gallop or rub normal S1 and S2 Abdomen: Morbidly OBESE, negative abdominal pain, nondistended, positive soft, bowel sounds, no rebound, no ascites, no appreciable mass Extremities: No significant cyanosis, clubbing, or edema bilateral lower extremities Skin: Negative rashes, lesions, ulcers Psychiatric:  Negative depression, negative anxiety, negative fatigue, negative mania  Central nervous system:  Cranial nerves II through XII intact, tongue/uvula midline, all extremities muscle strength 5/5, sensation intact throughout, negative dysarthria, negative expressive aphasia, negative receptive aphasia.  .     Data Reviewed: Care during the described time interval was provided by me .  I have reviewed this patient's available data, including medical  history, events of note, physical examination, and all test results as part of my evaluation.  CBC: Recent Labs  Lab 06/13/21 0856 06/14/21 0313  WBC 11.0* 9.7  NEUTROABS 9.0*  --   HGB 10.4* 9.7*  HCT 37.6 35.0*  MCV 107.1* 107.0*  PLT 575* 496*   Basic Metabolic Panel: Recent Labs  Lab 06/13/21 0856 06/14/21 0313  NA 147* 146*  K 3.6 3.3*  CL 103 106  CO2 32 31  GLUCOSE 71 276*  BUN 39* 36*  CREATININE 1.88* 1.60*  CALCIUM 9.1 9.1   GFR: CrCl cannot be calculated (Unknown ideal weight.). Liver Function Tests: Recent Labs  Lab 06/13/21 0856 06/14/21 0313  AST 12* 9*  ALT 8 8  ALKPHOS 61 50  BILITOT 0.9 0.6  PROT 6.0* 5.5*  ALBUMIN 2.3* 2.2*   Recent Labs  Lab 06/13/21 0856  LIPASE 36   Recent Labs  Lab 06/13/21 0856  AMMONIA 36*   Coagulation Profile: No results for input(s): "INR", "PROTIME" in the last 168 hours. Cardiac Enzymes: No results for input(s): "CKTOTAL", "CKMB", "CKMBINDEX", "TROPONINI" in the last 168 hours. BNP (last 3 results) No results for input(s): "PROBNP" in the last 8760 hours. HbA1C: Recent Labs    06/13/21 2308  HGBA1C 5.6   CBG: Recent Labs  Lab 06/13/21 1332 06/14/21 0021 06/14/21 0150 06/14/21 0516 06/14/21 0745  GLUCAP 109* 225* 226* 271* 247*   Lipid Profile: No results for input(s): "CHOL", "HDL", "LDLCALC", "TRIG", "CHOLHDL", "LDLDIRECT" in the last 72 hours. Thyroid Function Tests: No results for input(s): "TSH", "T4TOTAL", "FREET4", "T3FREE", "THYROIDAB" in the last 72 hours. Anemia Panel: No results for input(s): "VITAMINB12", "FOLATE", "FERRITIN", "TIBC", "IRON", "RETICCTPCT" in the last 72 hours. Sepsis Labs: Recent Labs  Lab 06/13/21 0907 06/13/21 1230  PROCALCITON  --  0.43  LATICACIDVEN 0.7  --     Recent Results (from the past 240 hour(s))  SARS Coronavirus 2 by RT PCR (hospital order, performed in [facility] hospital lab) *cepheid single result test* Anterior Nasal Swab     Status:  None   Collection Time: 06/13/21  9:59 AM   Specimen: Anterior Nasal Swab  Result Value Ref Range Status   SARS Coronavirus 2 by RT PCR NEGATIVE NEGATIVE Final    Comment: (NOTE) SARS-CoV-2 target nucleic acids are NOT DETECTED.  The SARS-CoV-2 RNA is generally detectable in upper and lower respiratory specimens during the acute phase of infection. The lowest concentration of SARS-CoV-2 viral copies this assay can detect is 250 copies / mL. A negative result does not preclude SARS-CoV-2 infection and should not be used as the sole basis for treatment or other patient management decisions.  A negative result may occur with improper specimen collection / handling, submission of specimen other than nasopharyngeal swab, presence of viral mutation(s) within the areas targeted by this assay, and inadequate number of viral copies (<250 copies / mL). A negative result must be combined with clinical observations, patient history, and epidemiological information.  Fact Sheet for Patients:   https://www.patel.info/  Fact Sheet for Healthcare Providers: https://hall.com/  This test is not yet approved or  cleared by the [place] FDA and has been authorized for detection and/or diagnosis of SARS-CoV-2 by FDA under an Emergency Use Authorization (EUA).  This EUA will remain in effect (meaning this test can be used) for the duration of the COVID-19 declaration under Section 564(b)(1) of the Act, 21 U.S.C. section 360bbb-3(b)(1), unless the authorization is terminated or revoked sooner.  Performed at [facility], [place] [address], [place], [place] [address]   MRSA Next Gen by PCR, Nasal     Status: None   Collection Time: 06/14/21  1:31 AM   Specimen: Nasal Mucosa; Nasal Swab  Result Value Ref Range Status   MRSA by PCR Next Gen NOT DETECTED NOT DETECTED Final    Comment: (NOTE) The GeneXpert MRSA Assay (FDA approved for  NASAL specimens only), is one component of a comprehensive MRSA colonization surveillance program. It is not intended to diagnose MRSA infection nor to guide or monitor treatment for MRSA infections. Test performance is not FDA approved in patients less than 79 years old. Performed at [facility] [address], [place], [place] [address]          Radiology Studies: CT CHEST WO CONTRAST  Result Date: 06/13/2021 CLINICAL DATA:  Pneumonia. Complication suspected. Right upper lobe collapse. Progressive decline. Poor p.o. intake and confusion. Approximally 3 weeks since patient has ambulated. Left-sided facial droop. EXAM: CT CHEST WITHOUT CONTRAST; CT ABDOMEN AND PELVIS WITHOUT CONTRAST TECHNIQUE: Multidetector CT imaging of the chest, abdomen, and pelvis was performed following the standard protocol without IV contrast. RADIATION DOSE REDUCTION: This exam was performed according to the departmental dose-optimization program which includes automated exposure control, adjustment of the mA and/or kV according to patient size and/or use of iterative reconstruction technique. COMPARISON:  AP chest 06/13/2021 and 05/26/2021; pelvis and right hip radiographs 05/04/2021; CT chest 11/13/2017 FINDINGS: Cardiovascular: Heart size is mildly enlarged. No pericardial effusion. Moderate to high-grade coronary artery calcifications. No thoracic aortic aneurysm. Moderate atherosclerotic calcifications within the thoracic aorta. Mediastinum/Nodes: No axillary, mediastinal, or hilar pathologically enlarged lymph nodes by CT criteria. Left axillary surgical clips. The right thyroid lobe is again enlarged. An approximate 2 cm low-density  right thyroid lobe nodule is grossly unchanged from prior. No follow-up imaging recommended. The esophagus follows a normal course of normal caliber. Lungs/Pleura: There is opacification of the distal right mainstem bronchus and portions of the proximal right upper  lobe segmental airways. There is high-grade opacification of the posterior right upper lobe with moderate opacification of the anterior and apical segments. There is also partial opacification of multiple portions of the posterolateral right lower lobe segmental airways. Small right pleural effusion. There are heterogeneous ground-glass and centrilobular opacities within the mid to posterior right upper lobe and mid to posterior right lower lobe, presumably infectious/inflammatory process. Mild ground-glass and curvilinear likely subsegmental atelectasis and/or scarring within the posterior left lung. No pneumothorax. Musculoskeletal: Moderate multilevel disc space narrowing and high-grade anterior right bridging osteophytes throughout the thoracic spine. Vertebral body heights are maintained. Severe bilateral glenohumeral osteoarthritis. -- Lack of intra-articular fluid limits evaluation of the abdominal and pelvic organ parenchyma. The following findings are made within this limitation. Hepatobiliary: Smooth liver contours. No gross liver lesion is seen. Tiny dependent gallstone. Pancreas: Grossly unremarkable. No gross pancreatic ductal dilatation or surrounding inflammatory changes. Spleen: Normal in size without focal abnormality. Adrenals/Urinary Tract: Normal adrenals. No renal stone or hydronephrosis. Within the limitations of lack of IV contrast, no contour deforming renal mass. The urinary bladder is only minimally distended, limiting evaluation but grossly unremarkable. Stomach/Bowel: High-grade sigmoid and descending colon and moderate transverse greater than ascending colon diverticulosis with underdistention and likely chronic mild wall thickening. No definite acute surrounding inflammatory fat stranding. The terminal ileum is unremarkable. The appendix is within normal limits. No dilated loops of bowel to indicate bowel obstruction. Vascular/Lymphatic: No abdominal aortic aneurysm. High-grade  atherosclerotic calcifications. No enlarged abdominal or pelvic lymph nodes. Reproductive: The uterus appears to be surgically absent. No gross adnexal abnormality is seen. Other: No abdominal wall hernia. No free air or free fluid. Musculoskeletal: Grade 1 anterolisthesis of L4 on L5. Moderate to severe L2-3 disc space narrowing and degenerative vacuum phenomenon. IMPRESSION: Chest: 1. Distal right mainstem bronchus and portions of the proximal right upper lobe segmental airway opacification with postobstructive atelectasis greatest within the posterior segment of the right upper lobe. This may be secondary to mucosal opacification. There are airspace opacities concerning for pneumonia within the right upper and lower lobes. Small right pleural effusion. It is difficult to exclude the presence of underlying solid pulmonary nodules. Consider follow-up PA and lateral radiographs 6 weeks after treatment to ensure resolution of the right lung findings. 2. Mild cardiomegaly. Abdomen/pelvis: 1. High-grade distal and more moderate proximal diffuse colonic diverticulosis without inflammatory changes to indicate acute diverticulitis. 2. Tiny dependent gallstone. 3. Grade 1 anterolisthesis of L4 on L5, degenerative in etiology without a pars defect visualized. Aortic Atherosclerosis (ICD10-I70.0). Electronically Signed   By: Yvonne Kendall M.D.   On: 06/13/2021 12:06   CT Abdomen Pelvis Wo Contrast  Result Date: 06/13/2021 CLINICAL DATA:  Pneumonia. Complication suspected. Right upper lobe collapse. Progressive decline. Poor p.o. intake and confusion. Approximally 3 weeks since patient has ambulated. Left-sided facial droop. EXAM: CT CHEST WITHOUT CONTRAST; CT ABDOMEN AND PELVIS WITHOUT CONTRAST TECHNIQUE: Multidetector CT imaging of the chest, abdomen, and pelvis was performed following the standard protocol without IV contrast. RADIATION DOSE REDUCTION: This exam was performed according to the departmental  dose-optimization program which includes automated exposure control, adjustment of the mA and/or kV according to patient size and/or use of iterative reconstruction technique. COMPARISON:  AP chest 06/13/2021 and 05/26/2021; pelvis  and right hip radiographs 05/04/2021; CT chest 11/13/2017 FINDINGS: Cardiovascular: Heart size is mildly enlarged. No pericardial effusion. Moderate to high-grade coronary artery calcifications. No thoracic aortic aneurysm. Moderate atherosclerotic calcifications within the thoracic aorta. Mediastinum/Nodes: No axillary, mediastinal, or hilar pathologically enlarged lymph nodes by CT criteria. Left axillary surgical clips. The right thyroid lobe is again enlarged. An approximate 2 cm low-density right thyroid lobe nodule is grossly unchanged from prior. No follow-up imaging recommended. The esophagus follows a normal course of normal caliber. Lungs/Pleura: There is opacification of the distal right mainstem bronchus and portions of the proximal right upper lobe segmental airways. There is high-grade opacification of the posterior right upper lobe with moderate opacification of the anterior and apical segments. There is also partial opacification of multiple portions of the posterolateral right lower lobe segmental airways. Small right pleural effusion. There are heterogeneous ground-glass and centrilobular opacities within the mid to posterior right upper lobe and mid to posterior right lower lobe, presumably infectious/inflammatory process. Mild ground-glass and curvilinear likely subsegmental atelectasis and/or scarring within the posterior left lung. No pneumothorax. Musculoskeletal: Moderate multilevel disc space narrowing and high-grade anterior right bridging osteophytes throughout the thoracic spine. Vertebral body heights are maintained. Severe bilateral glenohumeral osteoarthritis. -- Lack of intra-articular fluid limits evaluation of the abdominal and pelvic organ parenchyma.  The following findings are made within this limitation. Hepatobiliary: Smooth liver contours. No gross liver lesion is seen. Tiny dependent gallstone. Pancreas: Grossly unremarkable. No gross pancreatic ductal dilatation or surrounding inflammatory changes. Spleen: Normal in size without focal abnormality. Adrenals/Urinary Tract: Normal adrenals. No renal stone or hydronephrosis. Within the limitations of lack of IV contrast, no contour deforming renal mass. The urinary bladder is only minimally distended, limiting evaluation but grossly unremarkable. Stomach/Bowel: High-grade sigmoid and descending colon and moderate transverse greater than ascending colon diverticulosis with underdistention and likely chronic mild wall thickening. No definite acute surrounding inflammatory fat stranding. The terminal ileum is unremarkable. The appendix is within normal limits. No dilated loops of bowel to indicate bowel obstruction. Vascular/Lymphatic: No abdominal aortic aneurysm. High-grade atherosclerotic calcifications. No enlarged abdominal or pelvic lymph nodes. Reproductive: The uterus appears to be surgically absent. No gross adnexal abnormality is seen. Other: No abdominal wall hernia. No free air or free fluid. Musculoskeletal: Grade 1 anterolisthesis of L4 on L5. Moderate to severe L2-3 disc space narrowing and degenerative vacuum phenomenon. IMPRESSION: Chest: 1. Distal right mainstem bronchus and portions of the proximal right upper lobe segmental airway opacification with postobstructive atelectasis greatest within the posterior segment of the right upper lobe. This may be secondary to mucosal opacification. There are airspace opacities concerning for pneumonia within the right upper and lower lobes. Small right pleural effusion. It is difficult to exclude the presence of underlying solid pulmonary nodules. Consider follow-up PA and lateral radiographs 6 weeks after treatment to ensure resolution of the right lung  findings. 2. Mild cardiomegaly. Abdomen/pelvis: 1. High-grade distal and more moderate proximal diffuse colonic diverticulosis without inflammatory changes to indicate acute diverticulitis. 2. Tiny dependent gallstone. 3. Grade 1 anterolisthesis of L4 on L5, degenerative in etiology without a pars defect visualized. Aortic Atherosclerosis (ICD10-I70.0). Electronically Signed   By: Yvonne Kendall M.D.   On: 06/13/2021 12:06   CT Head Wo Contrast  Result Date: 06/13/2021 CLINICAL DATA:  Mental status changes, progressive decline, poor p.o. intake, confusion, LEFT facial droop, has not ambulated in 3 weeks EXAM: CT HEAD WITHOUT CONTRAST TECHNIQUE: Contiguous axial images were obtained from the base of the  skull through the vertex without intravenous contrast. RADIATION DOSE REDUCTION: This exam was performed according to the departmental dose-optimization program which includes automated exposure control, adjustment of the mA and/or kV according to patient size and/or use of iterative reconstruction technique. COMPARISON:  05/26/2021 FINDINGS: Brain: Generalized atrophy. Normal ventricular morphology. No midline shift or mass effect. Small vessel chronic ischemic changes of deep cerebral white matter. No intracranial hemorrhage, mass lesion, evidence of acute infarction, or extra-axial fluid collection. Vascular: Atherosclerotic calcification of internal carotid arteries at skull base Skull: Intact.  Hyperostosis frontalis interna. Sinuses/Orbits: Clear Other: N/A IMPRESSION: Atrophy with small vessel chronic ischemic changes of deep cerebral white matter. No acute intracranial abnormalities. Electronically Signed   By: Lavonia Dana M.D.   On: 06/13/2021 09:46   DG Chest Port 1 View  Result Date: 06/13/2021 CLINICAL DATA:  Weakness.  Increased failure to thrive.  Confusion. EXAM: PORTABLE CHEST 1 VIEW COMPARISON:  AP chest 05/26/2021 FINDINGS: Cardiac silhouette is at the upper limits of normal size for AP  technique. Mediastinal contours are grossly within normal limits. Calcification is seen within the aortic arch. There is right-sided volume loss and homogeneous opacification within the superomedial right hemithorax indicating right upper lobe collapse. There is mild rightward deviation of the trachea due to the volume loss. The left lung is clear. No pleural effusion is seen. No pneumothorax. Moderate multilevel degenerative disc space narrowing and endplate osteophytes of the thoracic spine. IMPRESSION: 1. New right upper lobe collapse with associated volume loss and mild rightward mediastinal shift. 2. The left lung is clear. Electronically Signed   By: Yvonne Kendall M.D.   On: 06/13/2021 09:19        Scheduled Meds:  chlorhexidine  15 mL Mouth Rinse BID   Chlorhexidine Gluconate Cloth  6 each Topical Daily   clopidogrel  75 mg Oral Q breakfast   donepezil  10 mg Oral QHS   FLUoxetine  40 mg Oral Daily   gabapentin  300 mg Oral BID   guaiFENesin  600 mg Oral BID   insulin aspart  0-9 Units Subcutaneous Q4H   insulin detemir  8 Units Subcutaneous Daily   ipratropium-albuterol  3 mL Nebulization TID   latanoprost  1 drop Left Eye QHS   levETIRAcetam  250 mg Oral Daily   mouth rinse  15 mL Mouth Rinse q12n4p   pravastatin  10 mg Oral q1800   timolol  1 drop Left Eye Daily   Continuous Infusions:  albumin human     azithromycin     cefTRIAXone (ROCEPHIN)  IV Stopped (06/14/21 1001)   dextrose 5% lactated ringers 50 mL/hr at 06/14/21 1003   [START ON 06/15/2021] fluconazole (DIFLUCAN) IV     fluconazole (DIFLUCAN) IV       LOS: 1 day    Time spent:50 min    Nare Gaspari, Geraldo Docker, MD [facility]   If 7PM-7AM, please contact night-coverage 06/14/2021, 10:13 AM

## 2021-06-14 NOTE — Evaluation (Signed)
Occupational Therapy Evaluation Patient Details Name: Tiffany Hartman MRN: [id number] DOB: September 01, 1927 Today's Date: 06/14/2021   History of Present Illness Pt is 88 yo female presenting with weakness, lethargy. PMH includes HTN, HLD, DM2, MGUS, CKD3b, chronic hypoxic respiratory failure on 2L [place], CVA.   Clinical Impression   Pt admitted with the above diagnoses and presents with below problem list. Pt will benefit from continued acute OT to address the below listed deficits and maximize independence with basic ADLs prior to d/c to next venue. At baseline, pt lives with family, walks short distances (ex bathroom) utilizing rw, assist with bathing/dressing. Pt presents with deficits in cognition, communication, strength,balance, and activity tolerance impacting ADLs. Pt currently requires total A with all ADLs. Of note, pt's bp at start of session in supine: 97/36, supine after sitting EOB for about 1.5 minutes (total A) bp 173/135.        Recommendations for follow up therapy are one component of a multi-disciplinary discharge planning process, led by the attending physician.  Recommendations may be updated based on patient status, additional functional criteria and insurance authorization.   Follow Up Recommendations  Skilled nursing-short term rehab (<3 hours/day)    Assistance Recommended at Discharge Frequent or constant Supervision/Assistance  Patient can return home with the following Two people to help with walking and/or transfers;Two people to help with bathing/dressing/bathroom;Assistance with cooking/housework;Assistance with feeding;Direct supervision/assist for medications management;Direct supervision/assist for financial management;Assist for transportation;Help with stairs or ramp for entrance    Functional Status Assessment  Patient has had a recent decline in their functional status and demonstrates the ability to make significant improvements in function in a reasonable and  predictable amount of time.  Equipment Recommendations  Other (comment) (defer to next venue)    Recommendations for Other Services       Precautions / Restrictions Precautions Precautions: Fall Precaution Comments: family reports pt slid to the floor in the bathroom prior to admission, but prior to getting sick she didn't have falls Restrictions Weight Bearing Restrictions: No      Mobility Bed Mobility Overal bed mobility: Needs Assistance Bed Mobility: Supine to Sit, Sit to Supine     Supine to sit: +2 for physical assistance, Total assist Sit to supine: Total assist, +2 for physical assistance   General bed mobility comments: pt unable to participate with mobility 2* decreased cognition    Transfers                   General transfer comment: NT 2* poor sitting balance; will need mechanical lift for transfers      Balance Overall balance assessment: Needs assistance Sitting-balance support: Bilateral upper extremity supported, Feet unsupported Sitting balance-Leahy Scale: Poor Sitting balance - Comments: mod A for posterior lean Postural control: Posterior lean                                 ADL either performed or assessed with clinical judgement   ADL Overall ADL's : Needs assistance/impaired Eating/Feeding: NPO   Grooming: Total assistance   Upper Body Bathing: Total assistance   Lower Body Bathing: Total assistance   Upper Body Dressing : Total assistance   Lower Body Dressing: Total assistance                 General ADL Comments: Pt tolerated EOB sitting about 1.5 minutes with total A.     Vision  Perception     Praxis      Pertinent Vitals/Pain Pain Assessment Pain Assessment: Faces Faces Pain Scale: Hurts little more Pain Location: generalized, L shoulder at 30* flexion. LLE at end of session Pain Descriptors / Indicators: Grimacing, Guarding Pain Intervention(s): Limited activity within patient's  tolerance, Monitored during session, Repositioned     Hand Dominance Right   Extremity/Trunk Assessment Upper Extremity Assessment Upper Extremity Assessment: Generalized weakness;LUE deficits/detail LUE Deficits / Details: pain limiting AROM shoulder flexion to about 30* in supported seating.   Lower Extremity Assessment Lower Extremity Assessment: Defer to PT evaluation   Cervical / Trunk Assessment Cervical / Trunk Assessment: Normal   Communication Communication Communication: Expressive difficulties (slurred speech, family reports this is not baseline, started a few days ago, gradual worsening)   Cognition Arousal/Alertness: Awake/alert Behavior During Therapy: Flat affect Overall Cognitive Status: Impaired/Different from baseline Area of Impairment: Orientation, Memory, Following commands, Safety/judgement, Awareness, Problem solving                 Orientation Level: Place, Time, Situation   Memory: Decreased recall of precautions, Decreased short-term memory Following Commands: Follows one step commands inconsistently Safety/Judgement: Decreased awareness of safety, Decreased awareness of deficits Awareness: Intellectual Problem Solving: Difficulty sequencing, Requires verbal cues, Requires tactile cues, Slow processing, Decreased initiation General Comments: Family reports worsening cognition over the past several days. Pt not currently at baseline.     General Comments  Multiple family members present    Exercises     Shoulder Instructions      Home Living Family/patient expects to be discharged to:: Private residence Living Arrangements: Children Available Help at Discharge: Family;Available 24 hours/day Type of Home: House Home Access: Stairs to enter [employer] of Steps: 2   Home Layout: One level     Bathroom Shower/Tub: Occupational psychologist: Handicapped height     Home Equipment: Conservation officer, nature (2 wheels);Shower  seat;Other (comment);BSC/3in1;Wheelchair - manual;Rollator (4 wheels)   Additional Comments: O2 dependent at home (2L 24/7); lives with daughter      Prior Functioning/Environment Prior Level of Function : Needs assist             Mobility Comments: walked short distances with RW ADLs Comments: assist for bathing/dressing        OT Problem List: Decreased strength;Decreased activity tolerance;Impaired balance (sitting and/or standing);Decreased cognition;Decreased safety awareness;Decreased knowledge of use of DME or AE;Decreased knowledge of precautions;Cardiopulmonary status limiting activity;Pain      OT Treatment/Interventions: Self-care/ADL training;DME and/or AE instruction;Therapeutic activities;Cognitive remediation/compensation;Patient/family education;Balance training    OT Goals(Current goals can be found in the care plan section) Acute Rehab OT Goals Patient Stated Goal: unable to state OT Goal Formulation: Patient unable to participate in goal setting Time For Goal Achievement: 06/28/21 Potential to Achieve Goals: Fair ADL Goals Pt Will Perform Grooming: bed level;with max assist Pt Will Perform Upper Body Bathing: with max assist;bed level Pt Will Perform Lower Body Bathing: sitting/lateral leans;with max assist;bed level Additional ADL Goal #1: Pt will complete bed mobility with mod A +2 to prepare for EOB/OOB ADLs.  OT Frequency: Min 2X/week    Co-evaluation PT/OT/SLP Co-Evaluation/Treatment: Yes Reason for Co-Treatment: Complexity of the patient's impairments (multi-system involvement);Necessary to address cognition/behavior during functional activity;For patient/therapist safety;To address functional/ADL transfers PT goals addressed during session: Mobility/safety with mobility;Balance;Strengthening/ROM OT goals addressed during session: ADL's and self-care;Strengthening/ROM      AM-PAC OT "6 Clicks" Daily Activity     Outcome Measure Help from  another  person eating meals?: Total Help from another person taking care of personal grooming?: Total Help from another person toileting, which includes using toliet, bedpan, or urinal?: Total Help from another person bathing (including washing, rinsing, drying)?: Total Help from another person to put on and taking off regular upper body clothing?: Total Help from another person to put on and taking off regular lower body clothing?: Total 6 Click Score: 6   End of Session Equipment Utilized During Treatment: Oxygen  Activity Tolerance: Patient limited by lethargy Patient left: in bed;with call bell/phone within reach;with bed alarm set;with family/visitor present;with SCD's reapplied  OT Visit Diagnosis: Muscle weakness (generalized) (M62.81);Pain;Other symptoms and signs involving cognitive function;History of falling (Z91.81);Other abnormalities of gait and mobility (R26.89);Unsteadiness on feet (R26.81)                Time: 7867-6720 OT Time Calculation (min): 25 min Charges:  OT General Charges $OT Visit: 1 Visit OT Evaluation $OT Eval Moderate Complexity: [place], OT Acute Rehabilitation Services Office: [phone]   Hortencia Pilar 06/14/2021, 2:28 PM

## 2021-06-14 NOTE — Progress Notes (Signed)
Consent received via telephone from daughter for arterial line.    Verified arterial line order with Dr. Marvel Plan. Arterial line not necessary at this time per MD. Continue to monitor for BP MAP goal > 55.

## 2021-06-14 NOTE — Progress Notes (Signed)
RT removed the BIPAP due to her ABG being compensated. The Pt still seems a little sleepy but is talking to the family. The family states that the Pt usually goes to the bathroom on her on and sits in a chair during the day. RT will continue to monitor

## 2021-06-14 NOTE — Progress Notes (Signed)
Initial Nutrition Assessment  DOCUMENTATION CODES:   Not applicable  INTERVENTION:  - will monitor for plan.   NUTRITION DIAGNOSIS:   Inadequate oral intake related to acute illness, inability to eat as evidenced by per patient/family report, NPO status.  GOAL:   Patient will meet greater than or equal to 90% of their needs  MONITOR:   Diet advancement, Labs, Weight trends  REASON FOR ASSESSMENT:   Malnutrition Screening Tool  ASSESSMENT:   88 y.o. female with medical history of HTN, HLD, type 2 DM, MGUS, stage 3 CKD, chronic hypoxic respiratory failure on 2L [place], dementia, Parkinson's disease, osteoarthritis, diverticulitis, and CVA. She presented to the ED due to weakness and lethargy. History from granddaughter who reported that patient has not eaten much for the last couple of weeks. She has become immobile during that time. Her granddaughter also reported cough and congestion over the last week. She was admitted for multifocal pneumonia.  Patient laying in bed with no visitors present at the time of RD visit. Diet changed from Carb Modified, which was ordered yesterday at 2011, to [place] today at 0735. Patient remains NPO.   Patient able to vocalize but is non-coherent during RD visit.   She has not been seen by a [facility] RD since 08/2014.  Weight yesterday was 226 lb and PTA the most recently documented weight was 229 lb on 03/01/21. This indicates 3 lb weight loss (1.3% body weight) in the past 4 months.     Labs reviewed; CBGs: 225, 226, 271, 247 mg/dl, Na: 146 mmol/l, K: 3.3 mmol/l, BUN: 36 mg/dl, creatinine: 1.6 mg/dl, GFR: 30 ml/min.  Medications reviewed; sliding scale novolog, 8 units levemir/day, 10 mEq IV KCl x2 run 6/9.  IVF; D5-LR @ 50 ml/hr (204 kcal/24 hrs).    NUTRITION - FOCUSED PHYSICAL EXAM:  Flowsheet Row Most Recent Value  Orbital Region No depletion  Upper Arm Region No depletion  Thoracic and Lumbar Region Unable to assess  Buccal Region  No depletion  Temple Region No depletion  Clavicle Bone Region No depletion  Clavicle and Acromion Bone Region No depletion  Scapular Bone Region Unable to assess  Dorsal Hand No depletion  Patellar Region No depletion  Anterior Thigh Region No depletion  Posterior Calf Region No depletion  Edema (RD Assessment) Mild  [BLE]  Hair Reviewed  Eyes Reviewed  Mouth Reviewed  [black patches on tongue]  Skin Reviewed  Nails Reviewed       Diet Order:   Diet Order             Diet NPO time specified  Diet effective now                   EDUCATION NEEDS:   No education needs have been identified at this time  Skin:  Skin Assessment: Reviewed RN Assessment  Last BM:  PTA/unknown  Height:   Ht Readings from Last 1 Encounters:  10/27/19 '5\' 1"'$  (1.549 m)    Weight:   Wt Readings from Last 1 Encounters:  06/13/21 102.5 kg     BMI:  Body mass index is 42.7 kg/m.  Estimated Nutritional Needs:  Kcal:  1650-1850 kcal Protein:  75-85 grams Fluid:  >/= 1.7 L/day     Jarome Matin, MS, RD, LDN Registered Dietitian II Inpatient Clinical Nutrition RD pager # and on-call/weekend pager # available in [facility]

## 2021-06-14 NOTE — Evaluation (Signed)
Physical Therapy Evaluation Patient Details Name: Tiffany Hartman MRN: [id number] DOB: [birth year] Today's Date: 06/14/2021  History of Present Illness  Pt is 88 yo female presenting with weakness, lethargy. PMH includes HTN, HLD, DM2, MGUS, CKD3b, chronic hypoxic respiratory failure on 2L [place], CVA.  Clinical Impression  Pt admitted with above diagnosis. Pt required +2 total assist for supine to sit, she sat at edge of bed for ~8 minutes with mod A for sitting balance. BP 172/135 in supine at end of session, RN notified. Pt is oriented to self only. Mechanical lift recommended for transfers. ST-SNF recommended.  Pt currently with functional limitations due to the deficits listed below (see PT Problem List). Pt will benefit from skilled PT to increase their independence and safety with mobility to allow discharge to the venue listed below.          Recommendations for follow up therapy are one component of a multi-disciplinary discharge planning process, led by the attending physician.  Recommendations may be updated based on patient status, additional functional criteria and insurance authorization.  Follow Up Recommendations Skilled nursing-short term rehab (<3 hours/day)    Assistance Recommended at Discharge Frequent or constant Supervision/Assistance  Patient can return home with the following  Two people to help with walking and/or transfers;Assistance with cooking/housework;Assist for transportation;Direct supervision/assist for medications management;Help with stairs or ramp for entrance;Direct supervision/assist for financial management;Two people to help with bathing/dressing/bathroom    Equipment Recommendations None recommended by PT  Recommendations for Other Services       Functional Status Assessment Patient has had a recent decline in their functional status and demonstrates the ability to make significant improvements in function in a reasonable and predictable amount of time.      Precautions / Restrictions Precautions Precautions: Fall Precaution Comments: family reports pt slid to the floor in the bathroom prior to admission, but prior to getting sick she didn't have falls Restrictions Weight Bearing Restrictions: No      Mobility  Bed Mobility Overal bed mobility: Needs Assistance Bed Mobility: Supine to Sit, Sit to Supine     Supine to sit: +2 for physical assistance, Total assist Sit to supine: Total assist, +2 for physical assistance   General bed mobility comments: pt unable to participate with mobility 2* decreased cognition    Transfers                   General transfer comment: NT 2* poor sitting balance; will need mechanical lift for transfers    Ambulation/Gait                  Stairs            Wheelchair Mobility    Modified Rankin (Stroke Patients Only)       Balance Overall balance assessment: Needs assistance Sitting-balance support: Bilateral upper extremity supported, Feet unsupported Sitting balance-Leahy Scale: Poor Sitting balance - Comments: mod A for posterior lean Postural control: Posterior lean                                   Pertinent Vitals/Pain Pain Assessment Breathing: normal Negative Vocalization: none Facial Expression: smiling or inexpressive Body Language: relaxed Consolability: no need to console PAINAD Score: 0    Home Living Family/patient expects to be discharged to:: Private residence Living Arrangements: Children Available Help at Discharge: Family;Available 24 hours/day Type of Home: House Home Access: Stairs to  enter   Entrance Stairs-Number of Steps: 2   Home Layout: One level Home Equipment: Conservation officer, nature (2 wheels);Shower seat;Other (comment);BSC/3in1;Wheelchair - manual;Rollator (4 wheels) Additional Comments: O2 dependent at home (2L 24/7); lives with daughter    Prior Function Prior Level of Function : Needs assist              Mobility Comments: walked short distances with RW ADLs Comments: assist for bathing/dressing     Hand Dominance        Extremity/Trunk Assessment   Upper Extremity Assessment Upper Extremity Assessment: Defer to OT evaluation    Lower Extremity Assessment Lower Extremity Assessment: Difficult to assess due to impaired cognition (pt resisted ROM assessment of BLEs)    Cervical / Trunk Assessment Cervical / Trunk Assessment: Normal  Communication   Communication: Expressive difficulties (slurred speech, family reports this is not baseline)  Cognition Arousal/Alertness: Awake/alert Behavior During Therapy: WFL for tasks assessed/performed Overall Cognitive Status: Impaired/Different from baseline Area of Impairment: Orientation, Memory, Following commands, Safety/judgement, Awareness, Problem solving                 Orientation Level: Place, Time, Situation     Following Commands: Follows one step commands inconsistently Safety/Judgement: Decreased awareness of safety, Decreased awareness of deficits   Problem Solving: Difficulty sequencing, Requires verbal cues, Requires tactile cues, Slow processing, Decreased initiation General Comments: pt able to state her birthdate, not able to state her location nor the year        General Comments      Exercises     Assessment/Plan    PT Assessment Patient needs continued PT services  PT Problem List Decreased strength;Decreased activity tolerance;Decreased balance;Decreased cognition;Decreased mobility       PT Treatment Interventions Therapeutic activities;Gait training;Therapeutic exercise;Functional mobility training;Balance training;Patient/family education;DME instruction    PT Goals (Current goals can be found in the Care Plan section)  Acute Rehab PT Goals Patient Stated Goal: to get her strength back PT Goal Formulation: With family Time For Goal Achievement: 06/28/21 Potential to Achieve Goals:  Fair    Frequency Min 2X/week     Co-evaluation PT/OT/SLP Co-Evaluation/Treatment: Yes Reason for Co-Treatment: Complexity of the patient's impairments (multi-system involvement);For patient/therapist safety;Necessary to address cognition/behavior during functional activity;To address functional/ADL transfers PT goals addressed during session: Mobility/safety with mobility;Balance;Strengthening/ROM         AM-PAC PT "6 Clicks" Mobility  Outcome Measure Help needed turning from your back to your side while in a flat bed without using bedrails?: Total Help needed moving from lying on your back to sitting on the side of a flat bed without using bedrails?: Total Help needed moving to and from a bed to a chair (including a wheelchair)?: Total Help needed standing up from a chair using your arms (e.g., wheelchair or bedside chair)?: Total Help needed to walk in hospital room?: Total Help needed climbing 3-5 steps with a railing? : Total 6 Click Score: 6    End of Session Equipment Utilized During Treatment: Oxygen Activity Tolerance: No increased pain;Patient limited by fatigue Patient left: in bed;with bed alarm set;with call bell/phone within reach;with family/visitor present Nurse Communication: Need for lift equipment;Mobility status PT Visit Diagnosis: Muscle weakness (generalized) (M62.81);Difficulty in walking, not elsewhere classified (R26.2);Other abnormalities of gait and mobility (R26.89);History of falling (Z91.81)    Time: 4196-2229 PT Time Calculation (min) (ACUTE ONLY): 26 min   Charges:   PT Evaluation $PT Eval Moderate Complexity: 1 Mod  Blondell Reveal Kistler PT 06/14/2021  Acute Rehabilitation Services  Office [phone]

## 2021-06-14 NOTE — Progress Notes (Signed)
Pt usually wears 2l of O2 at home. RT left the O2 off and will put it back on if her SATS drop

## 2021-06-15 DIAGNOSIS — J9621 Acute and chronic respiratory failure with hypoxia: Secondary | ICD-10-CM | POA: Diagnosis not present

## 2021-06-15 DIAGNOSIS — N179 Acute kidney failure, unspecified: Secondary | ICD-10-CM | POA: Diagnosis not present

## 2021-06-15 DIAGNOSIS — J189 Pneumonia, unspecified organism: Secondary | ICD-10-CM | POA: Diagnosis not present

## 2021-06-15 DIAGNOSIS — R531 Weakness: Secondary | ICD-10-CM | POA: Diagnosis not present

## 2021-06-15 LAB — CBC WITH DIFFERENTIAL/PLATELET
Abs Immature Granulocytes: 0.16 10*3/uL — ABNORMAL HIGH (ref 0.00–0.07)
Basophils Absolute: 0 10*3/uL (ref 0.0–0.1)
Basophils Relative: 0 %
Eosinophils Absolute: 0 10*3/uL (ref 0.0–0.5)
Eosinophils Relative: 0 %
HCT: 32.1 % — ABNORMAL LOW (ref 36.0–46.0)
Hemoglobin: 9.1 g/dL — ABNORMAL LOW (ref 12.0–15.0)
Immature Granulocytes: 2 %
Lymphocytes Relative: 8 %
Lymphs Abs: 0.8 10*3/uL (ref 0.7–4.0)
MCH: 29.6 pg (ref 26.0–34.0)
MCHC: 28.3 g/dL — ABNORMAL LOW (ref 30.0–36.0)
MCV: 104.6 fL — ABNORMAL HIGH (ref 80.0–100.0)
Monocytes Absolute: 0.2 10*3/uL (ref 0.1–1.0)
Monocytes Relative: 2 %
Neutro Abs: 9.7 10*3/uL — ABNORMAL HIGH (ref 1.7–7.7)
Neutrophils Relative %: 88 %
Platelets: 465 10*3/uL — ABNORMAL HIGH (ref 150–400)
RBC: 3.07 MIL/uL — ABNORMAL LOW (ref 3.87–5.11)
RDW: 13.2 % (ref 11.5–15.5)
WBC: 10.9 10*3/uL — ABNORMAL HIGH (ref 4.0–10.5)
nRBC: 0.2 % (ref 0.0–0.2)

## 2021-06-15 LAB — GLUCOSE, CAPILLARY
Glucose-Capillary: 145 mg/dL — ABNORMAL HIGH (ref 70–99)
Glucose-Capillary: 144 mg/dL — ABNORMAL HIGH (ref 70–99)
Glucose-Capillary: 154 mg/dL — ABNORMAL HIGH (ref 70–99)
Glucose-Capillary: 201 mg/dL — ABNORMAL HIGH (ref 70–99)
Glucose-Capillary: 174 mg/dL — ABNORMAL HIGH (ref 70–99)
Glucose-Capillary: 182 mg/dL — ABNORMAL HIGH (ref 70–99)

## 2021-06-15 LAB — COMPREHENSIVE METABOLIC PANEL
ALT: 10 U/L (ref 0–44)
AST: 10 U/L — ABNORMAL LOW (ref 15–41)
Albumin: 2.6 g/dL — ABNORMAL LOW (ref 3.5–5.0)
Alkaline Phosphatase: 43 U/L (ref 38–126)
Anion gap: 6 (ref 5–15)
BUN: 31 mg/dL — ABNORMAL HIGH (ref 8–23)
CO2: 33 mmol/L — ABNORMAL HIGH (ref 22–32)
Calcium: 9.6 mg/dL (ref 8.9–10.3)
Chloride: 111 mmol/L (ref 98–111)
Creatinine, Ser: 1.41 mg/dL — ABNORMAL HIGH (ref 0.44–1.00)
GFR, Estimated: 35 mL/min — ABNORMAL LOW (ref >60)
Glucose, Bld: 154 mg/dL — ABNORMAL HIGH (ref 70–99)
Potassium: 3.7 mmol/L (ref 3.5–5.1)
Sodium: 150 mmol/L — ABNORMAL HIGH (ref 135–145)
Total Bilirubin: 0.4 mg/dL (ref 0.3–1.2)
Total Protein: 5.7 g/dL — ABNORMAL LOW (ref 6.5–8.1)

## 2021-06-15 LAB — BLOOD GAS, ARTERIAL
Acid-Base Excess: 8.4 mmol/L — ABNORMAL HIGH (ref 0.0–2.0)
Bicarbonate: 34.9 mmol/L — ABNORMAL HIGH (ref 20.0–28.0)
O2 Saturation: 98.5 %
Patient temperature: 36.5
pCO2 arterial: 54 mmHg — ABNORMAL HIGH (ref 32–48)
pH, Arterial: 7.42 (ref 7.35–7.45)
pO2, Arterial: 76 mmHg — ABNORMAL LOW (ref 83–108)

## 2021-06-15 LAB — MAGNESIUM: Magnesium: 2 mg/dL (ref 1.7–2.4)

## 2021-06-15 LAB — PHOSPHORUS: Phosphorus: 1.6 mg/dL — ABNORMAL LOW (ref 2.5–4.6)

## 2021-06-15 MED ORDER — POTASSIUM PHOSPHATES 15 MMOLE/5ML IV SOLN
30.0000 mmol | Freq: Once | INTRAVENOUS | Status: AC
  Administered 2021-06-15: 30 mmol via INTRAVENOUS
  Filled 2021-06-15: qty 10

## 2021-06-15 MED ORDER — STERILE WATER FOR INJECTION IJ SOLN
INTRAMUSCULAR | Status: AC
  Filled 2021-06-15: qty 10

## 2021-06-15 MED ORDER — DEXTROSE 5 % IV SOLN: INTRAVENOUS | Status: DC

## 2021-06-15 NOTE — Progress Notes (Signed)
Bipap stand by pt said she is ok without it. No resp distress noted. Family bedside said she use only O2 at home. RT will continue to monitor. Machine remained bedside.

## 2021-06-15 NOTE — Progress Notes (Signed)
PROGRESS NOTE    Tiffany Hartman  JJH:[id number] DOB: September 01, 1927 DOA: 06/13/2021 PCP: Carrolyn Meiers, MD     Brief Narrative:  88 y.o. BF PMHx  HTN, HLD, DM2, MGUS, CKD3b, chronic hypoxic respiratory failure on 2L [place], CVA.  Parkinson's disease, dementia  Presenting with weakness, lethargy. History from granddaughter at bedside. She reports that the patient has not eaten much at all for the last couple of weeks. She has become immobile during that time. She is not as interactive as her normal self. She has been very somnolent during this time. Her granddaughter also reports cough and congestion over the last week. The patient has not had any therapy for it. She has not have any fevers, N/V/D. She has not had any other complaints. When her condition did not improve this morning, her family decided to call EMS.    Subjective: 6/10 afebrile overnight A/O x1 (does not know where, when, why).  Pleasantly confused.  Cooperates.   Assessment & Plan: Covid vaccination;   Principal Problem:   Multifocal pneumonia Active Problems:   Diabetes ([place])   COPD (chronic obstructive pulmonary disease) ([place])   GERD without esophagitis   Dyslipidemia   Depression with anxiety   Acute on chronic respiratory failure with hypoxia (HCC)   Stage 3b chronic kidney disease (CKD) (HCC)   MGUS (monoclonal gammopathy of unknown significance)   Glaucoma   Generalized weakness   Failure to thrive in adult   HTN (hypertension), benign   Oropharyngeal candidiasis   Black hairy tongue   Septic shock (HCC)  Septic shock - On admission<36 C, RR> 20, hypotensive not responsive to fluid resuscitation, -See pneumonia  Multifocal PNA/Right mainstem obstruction/CAP -6/9 patient with very labile BP place arterial line - 6/9 Solu-Cortef '100mg'$  TID (stress dose) - 6/9 continue antibiotics x7 days - DuoNeb QID -BiPAP as required -Titrate O2 to maintain SPO2 89 to 95% - Incentive spirometry - Flutter  valve -6/10 ABG pending  COPD  -See pneumonia  Acute on Chronic Hypoxic Respiratory Failure -See pneumonia  Oropharyngeal candidiasis - Diflucan IV 200 mg x 1 then 100 mg x 14 days  Black Hairy tongue syndrome -No treatment required.   Failure to Thrive/generalized weakness/lethargy     - dietitian consult     - PT/OT consult     - CTH is negative   HTN/Hypotensive/Septic Shock -6/9 hold all BP medication -6/9 currently hypotensive -6/9 Albumin 50 g x 1 - 6/9 awaiting placement of arterial line, if BP actually true after fluid resuscitation and albumin we will start pressors   DM type II controlled with hyperglycemia - 6/9 hemoglobin A1c= 5.6 -6/9 Levemir 8 units daily -6/9 sensitive SSI       AKI on CKD stage 3b (baseline Cr 1.3--1.4) Lab Results  Component Value Date   CREATININE 1.41 (H) 06/15/2021   CREATININE 1.60 (H) 06/14/2021   CREATININE 1.88 (H) 06/13/2021   CREATININE 1.24 (H) 05/04/2021   CREATININE 1.24 (H) 07/03/2018  -Hold all nephrotoxins - CT: No obstruction       MGUS     - continue outpt follow up   HLD     - continue statin   Anxiety/depression     - continue home regimen   Glaucoma     - continue home regimen  Hypokalemia - Potassium goal> 4 - 6/9 Potassium IV 50 mEq  Hypophosphatemia - Phosphorus goal> 2.5  Hypernatremia - D5W 87m/hr  Obesity BMI 42.7 kg/m. -  Dysphagia? -  6/10 requesting speech evaluate patient with swallow evaluation      Mobility Assessment (last 72 hours)     Mobility Assessment     Row Name 06/15/21 0809 06/14/21 1400 06/14/21 1230 06/13/21 2230     Does patient have an order for bedrest or is patient medically unstable Yes- Bedfast (Level 1) - Complete -- -- Yes- Bedfast (Level 1) - Complete    What is the highest level of mobility based on the progressive mobility assessment? Level 1 (Bedfast) - Unable to balance while sitting on edge of bed Level 1 (Bedfast) - Unable to balance while  sitting on edge of bed Level 1 (Bedfast) - Unable to balance while sitting on edge of bed --               Interdisciplinary Goals of Care Family Meeting   Date carried out: 06/15/2021  Location of the meeting:   Member's involved:   Durable Power of Tour manager:     Discussion: We discussed goals of care for Tiffany Hartman .    Code status:   Disposition:   Time spent for the meeting:     Tiffany Hartman J, MD  06/15/2021, 10:31 AM         DVT prophylaxis: Subcu heparin Code Status: DNR Family Communication: 6/10 granddaughter at bedside for discussion of plan of care all questions answered Status is: Inpatient    Dispo: The patient is from: Home              Anticipated d/c is to: Home              Anticipated d/c date is: > 3 days              Patient currently is not medically stable to d/c.      Consultants:  PCCM  Procedures/Significant Events:    I have personally reviewed and interpreted all radiology studies and my findings are as above.  VENTILATOR SETTINGS:   Cultures 6/8 strep pneumo urine antigen pending 6/8 Legionella urine antigen pending 6/8 SARS coronavirus negative 6/8 influenza A/B negative 6/9 sputum pending 6/9 respiratory virus panel negative   Antimicrobials: Anti-infectives (From admission, onward)    Start     Ordered Stop   06/15/21 1000  fluconazole (DIFLUCAN) IVPB 100 mg        06/14/21 1010     06/14/21 1100  fluconazole (DIFLUCAN) IVPB 200 mg        06/14/21 1010 06/14/21 1309   06/14/21 1000  cefTRIAXone (ROCEPHIN) 2 g in sodium chloride 0.9 % 100 mL IVPB        06/13/21 2010 06/19/21 0959   06/14/21 1000  azithromycin (ZITHROMAX) 500 mg in sodium chloride 0.9 % 250 mL IVPB        06/13/21 2010 06/19/21 0959   06/13/21 1215  cefTRIAXone (ROCEPHIN) 1 g in sodium chloride 0.9 % 100 mL IVPB        06/13/21 1211 06/13/21 1618   06/13/21 1215  azithromycin (ZITHROMAX) 500 mg in  sodium chloride 0.9 % 250 mL IVPB        06/13/21 1211 06/13/21 1618         Devices    LINES / TUBES:      Continuous Infusions:  sodium chloride     azithromycin Stopped (06/14/21 1137)   cefTRIAXone (ROCEPHIN)  IV Stopped (06/14/21 1001)   dextrose 5% lactated ringers 50 mL/hr at 06/15/21  0600   fluconazole (DIFLUCAN) IV     potassium PHOSPHATE IVPB (in mmol) 85 mL/hr at 06/15/21 0600     Objective: Vitals:   06/15/21 0600 06/15/21 0804 06/15/21 0822 06/15/21 0905  BP: (!) 149/53 (!) 164/49    Pulse: 62 91    Resp: 18 17    Temp:   99 F (37.2 C)   TempSrc:   Oral   SpO2: 100% 100%  100%  Weight:        Intake/Output Summary (Last 24 hours) at 06/15/2021 1031 Last data filed at 06/15/2021 0600 Gross per 24 hour  Intake 1547.42 ml  Output 300 ml  Net 1247.42 ml    Filed Weights   06/13/21 2233  Weight: 102.5 kg    Examination:  General: A/O x1 (does not know where, when, why).  Positive acute respiratory distress Eyes: negative scleral hemorrhage, negative anisocoria, negative icterus ENT: Negative Runny nose, negative gingival bleeding, Neck:  Negative scars, masses, torticollis, lymphadenopathy, JVD Lungs: decreased breath sounds bilaterally without wheezes or crackles Cardiovascular: Regular rate and rhythm without murmur gallop or rub normal S1 and S2 Abdomen: Morbidly OBESE, negative abdominal pain, nondistended, positive soft, bowel sounds, no rebound, no ascites, no appreciable mass Extremities: No significant cyanosis, clubbing, or edema bilateral lower extremities Skin: Negative rashes, lesions, ulcers Psychiatric:  Negative depression, negative anxiety, negative fatigue, negative mania  Central nervous system:  Cranial nerves II through XII intact, tongue/uvula midline, all extremities muscle strength 5/5, sensation intact throughout, negative dysarthria, negative expressive aphasia, negative receptive aphasia.  .     Data Reviewed: Care  during the described time interval was provided by me .  I have reviewed this patient's available data, including medical history, events of note, physical examination, and all test results as part of my evaluation.  CBC: Recent Labs  Lab 06/13/21 0856 06/14/21 0313 06/15/21 0300  WBC 11.0* 9.7 10.9*  NEUTROABS 9.0*  --  9.7*  HGB 10.4* 9.7* 9.1*  HCT 37.6 35.0* 32.1*  MCV 107.1* 107.0* 104.6*  PLT 575* 496* 465*    Basic Metabolic Panel: Recent Labs  Lab 06/13/21 0856 06/14/21 0313 06/15/21 0300  NA 147* 146* 150*  K 3.6 3.3* 3.7  CL 103 106 111  CO2 32 31 33*  GLUCOSE 71 276* 154*  BUN 39* 36* 31*  CREATININE 1.88* 1.60* 1.41*  CALCIUM 9.1 9.1 9.6  MG  --   --  2.0  PHOS  --   --  1.6*    GFR: CrCl cannot be calculated (Unknown ideal weight.). Liver Function Tests: Recent Labs  Lab 06/13/21 0856 06/14/21 0313 06/15/21 0300  AST 12* 9* 10*  ALT '8 8 10  '$ ALKPHOS 61 50 43  BILITOT 0.9 0.6 0.4  PROT 6.0* 5.5* 5.7*  ALBUMIN 2.3* 2.2* 2.6*    Recent Labs  Lab 06/13/21 0856  LIPASE 36    Recent Labs  Lab 06/13/21 0856  AMMONIA 36*    Coagulation Profile: No results for input(s): "INR", "PROTIME" in the last 168 hours. Cardiac Enzymes: No results for input(s): "CKTOTAL", "CKMB", "CKMBINDEX", "TROPONINI" in the last 168 hours. BNP (last 3 results) No results for input(s): "PROBNP" in the last 8760 hours. HbA1C: Recent Labs    06/13/21 2308  HGBA1C 5.6    CBG: Recent Labs  Lab 06/14/21 1652 06/14/21 2023 06/14/21 2355 06/15/21 0404 06/15/21 0738  GLUCAP 130* 122* 103* 145* 144*    Lipid Profile: No results for input(s): "CHOL", "HDL", "LDLCALC", "  TRIG", "CHOLHDL", "LDLDIRECT" in the last 72 hours. Thyroid Function Tests: No results for input(s): "TSH", "T4TOTAL", "FREET4", "T3FREE", "THYROIDAB" in the last 72 hours. Anemia Panel: No results for input(s): "VITAMINB12", "FOLATE", "FERRITIN", "TIBC", "IRON", "RETICCTPCT" in the last 72  hours. Sepsis Labs: Recent Labs  Lab 06/13/21 0907 06/13/21 1230  PROCALCITON  --  0.53  LATICACIDVEN 0.7  --      Recent Results (from the past 240 hour(s))  SARS Coronavirus 2 by RT PCR (hospital order, performed in [facility] lab) *cepheid single result test* Anterior Nasal Swab     Status: None   Collection Time: 06/13/21  9:59 AM   Specimen: Anterior Nasal Swab  Result Value Ref Range Status   SARS Coronavirus 2 by RT PCR NEGATIVE NEGATIVE Final    Comment: (NOTE) SARS-CoV-2 target nucleic acids are NOT DETECTED.  The SARS-CoV-2 RNA is generally detectable in upper and lower respiratory specimens during the acute phase of infection. The lowest concentration of SARS-CoV-2 viral copies this assay can detect is 250 copies / mL. A negative result does not preclude SARS-CoV-2 infection and should not be used as the sole basis for treatment or other patient management decisions.  A negative result may occur with improper specimen collection / handling, submission of specimen other than nasopharyngeal swab, presence of viral mutation(s) within the areas targeted by this assay, and inadequate number of viral copies (<250 copies / mL). A negative result must be combined with clinical observations, patient history, and epidemiological information.  Fact Sheet for Patients:   https://www.patel.info/  Fact Sheet for Healthcare Providers: https://hall.com/  This test is not yet approved or  cleared by the [place] FDA and has been authorized for detection and/or diagnosis of SARS-CoV-2 by FDA under an Emergency Use Authorization (EUA).  This EUA will remain in effect (meaning this test can be used) for the duration of the COVID-19 declaration under Section 564(b)(1) of the Act, 21 U.S.C. section 360bbb-3(b)(1), unless the authorization is terminated or revoked sooner.  Performed at [facility], [place]  [address], [place], [place] [address]   Culture, blood (routine x 2)     Status: None (Preliminary result)   Collection Time: 06/13/21 12:30 PM   Specimen: BLOOD RIGHT HAND  Result Value Ref Range Status   Specimen Description   Final    BLOOD RIGHT HAND Performed at [facility] [address] [place], [place] [phone]    Special Requests   Final    BOTTLES DRAWN AEROBIC AND ANAEROBIC Blood Culture results may not be optimal due to an inadequate volume of blood received in culture bottles Performed at [facility] [address], [place], [place] [address]    Culture   Final    NO GROWTH 2 DAYS Performed at [facility] [address], [place], [place] [address]    Report Status PENDING  Incomplete  Culture, blood (routine x 2)     Status: None (Preliminary result)   Collection Time: 06/13/21 12:30 PM   Specimen: BLOOD RIGHT HAND  Result Value Ref Range Status   Specimen Description   Final    BLOOD RIGHT HAND Performed at [facility] [address] [place], [place] [address]    Special Requests   Final    BOTTLES DRAWN AEROBIC AND ANAEROBIC Blood Culture adequate volume Performed at [facility] [address], [place], [place] [address]    Culture  Final    NO GROWTH 2 DAYS Performed at [facility], [place] [address], [place], [place] [address]    Report Status PENDING  Incomplete  MRSA Next Gen by PCR, Nasal     Status: None   Collection Time: 06/14/21  1:31 AM   Specimen: Nasal Mucosa; Nasal Swab  Result Value Ref Range Status   MRSA by PCR Next Gen NOT DETECTED NOT DETECTED Final    Comment: (NOTE) The GeneXpert MRSA Assay (FDA approved for NASAL specimens only), is one component of a comprehensive MRSA colonization surveillance program. It is not intended to diagnose MRSA infection nor to guide or monitor treatment for MRSA infections. Test performance is not FDA  approved in patients less than 37 years old. Performed at [facility], [place] [address], [place], [place] [address]   Respiratory (~20 pathogens) panel by PCR     Status: None   Collection Time: 06/14/21  7:46 AM   Specimen: Nasopharyngeal Swab; Respiratory  Result Value Ref Range Status   Adenovirus NOT DETECTED NOT DETECTED Final   Coronavirus 229E NOT DETECTED NOT DETECTED Final    Comment: (NOTE) The Coronavirus on the Respiratory Panel, DOES NOT test for the novel  Coronavirus (2019 nCoV)    Coronavirus HKU1 NOT DETECTED NOT DETECTED Final   Coronavirus NL63 NOT DETECTED NOT DETECTED Final   Coronavirus OC43 NOT DETECTED NOT DETECTED Final   Metapneumovirus NOT DETECTED NOT DETECTED Final   Rhinovirus / Enterovirus NOT DETECTED NOT DETECTED Final   Influenza A NOT DETECTED NOT DETECTED Final   Influenza B NOT DETECTED NOT DETECTED Final   Parainfluenza Virus 1 NOT DETECTED NOT DETECTED Final   Parainfluenza Virus 2 NOT DETECTED NOT DETECTED Final   Parainfluenza Virus 3 NOT DETECTED NOT DETECTED Final   Parainfluenza Virus 4 NOT DETECTED NOT DETECTED Final   Respiratory Syncytial Virus NOT DETECTED NOT DETECTED Final   Bordetella pertussis NOT DETECTED NOT DETECTED Final   Bordetella Parapertussis NOT DETECTED NOT DETECTED Final   Chlamydophila pneumoniae NOT DETECTED NOT DETECTED Final   Mycoplasma pneumoniae NOT DETECTED NOT DETECTED Final    Comment: Performed at [facility], [place]. [address], [place],  [address]         Radiology Studies: CT CHEST WO CONTRAST  Result Date: 06/13/2021 CLINICAL DATA:  Pneumonia. Complication suspected. Right upper lobe collapse. Progressive decline. Poor p.o. intake and confusion. Approximally 3 weeks since patient has ambulated. Left-sided facial droop. EXAM: CT CHEST WITHOUT CONTRAST; CT ABDOMEN AND PELVIS WITHOUT CONTRAST TECHNIQUE: Multidetector CT imaging of the chest, abdomen, and pelvis was performed  following the standard protocol without IV contrast. RADIATION DOSE REDUCTION: This exam was performed according to the departmental dose-optimization program which includes automated exposure control, adjustment of the mA and/or kV according to patient size and/or use of iterative reconstruction technique. COMPARISON:  AP chest 06/13/2021 and 05/26/2021; pelvis and right hip radiographs 05/04/2021; CT chest 11/13/2017 FINDINGS: Cardiovascular: Heart size is mildly enlarged. No pericardial effusion. Moderate to high-grade coronary artery calcifications. No thoracic aortic aneurysm. Moderate atherosclerotic calcifications within the thoracic aorta. Mediastinum/Nodes: No axillary, mediastinal, or hilar pathologically enlarged lymph nodes by CT criteria. Left axillary surgical clips. The right thyroid lobe is again enlarged. An approximate 2 cm low-density right thyroid lobe nodule is grossly unchanged from prior. No follow-up imaging recommended. The esophagus follows a normal course of normal caliber. Lungs/Pleura: There is opacification of the distal right mainstem bronchus and portions of  the proximal right upper lobe segmental airways. There is high-grade opacification of the posterior right upper lobe with moderate opacification of the anterior and apical segments. There is also partial opacification of multiple portions of the posterolateral right lower lobe segmental airways. Small right pleural effusion. There are heterogeneous ground-glass and centrilobular opacities within the mid to posterior right upper lobe and mid to posterior right lower lobe, presumably infectious/inflammatory process. Mild ground-glass and curvilinear likely subsegmental atelectasis and/or scarring within the posterior left lung. No pneumothorax. Musculoskeletal: Moderate multilevel disc space narrowing and high-grade anterior right bridging osteophytes throughout the thoracic spine. Vertebral body heights are maintained. Severe  bilateral glenohumeral osteoarthritis. -- Lack of intra-articular fluid limits evaluation of the abdominal and pelvic organ parenchyma. The following findings are made within this limitation. Hepatobiliary: Smooth liver contours. No gross liver lesion is seen. Tiny dependent gallstone. Pancreas: Grossly unremarkable. No gross pancreatic ductal dilatation or surrounding inflammatory changes. Spleen: Normal in size without focal abnormality. Adrenals/Urinary Tract: Normal adrenals. No renal stone or hydronephrosis. Within the limitations of lack of IV contrast, no contour deforming renal mass. The urinary bladder is only minimally distended, limiting evaluation but grossly unremarkable. Stomach/Bowel: High-grade sigmoid and descending colon and moderate transverse greater than ascending colon diverticulosis with underdistention and likely chronic mild wall thickening. No definite acute surrounding inflammatory fat stranding. The terminal ileum is unremarkable. The appendix is within normal limits. No dilated loops of bowel to indicate bowel obstruction. Vascular/Lymphatic: No abdominal aortic aneurysm. High-grade atherosclerotic calcifications. No enlarged abdominal or pelvic lymph nodes. Reproductive: The uterus appears to be surgically absent. No gross adnexal abnormality is seen. Other: No abdominal wall hernia. No free air or free fluid. Musculoskeletal: Grade 1 anterolisthesis of L4 on L5. Moderate to severe L2-3 disc space narrowing and degenerative vacuum phenomenon. IMPRESSION: Chest: 1. Distal right mainstem bronchus and portions of the proximal right upper lobe segmental airway opacification with postobstructive atelectasis greatest within the posterior segment of the right upper lobe. This may be secondary to mucosal opacification. There are airspace opacities concerning for pneumonia within the right upper and lower lobes. Small right pleural effusion. It is difficult to exclude the presence of underlying  solid pulmonary nodules. Consider follow-up PA and lateral radiographs 6 weeks after treatment to ensure resolution of the right lung findings. 2. Mild cardiomegaly. Abdomen/pelvis: 1. High-grade distal and more moderate proximal diffuse colonic diverticulosis without inflammatory changes to indicate acute diverticulitis. 2. Tiny dependent gallstone. 3. Grade 1 anterolisthesis of L4 on L5, degenerative in etiology without a pars defect visualized. Aortic Atherosclerosis (ICD10-I70.0). Electronically Signed   By: Yvonne Kendall M.D.   On: 06/13/2021 12:06   CT Abdomen Pelvis Wo Contrast  Result Date: 06/13/2021 CLINICAL DATA:  Pneumonia. Complication suspected. Right upper lobe collapse. Progressive decline. Poor p.o. intake and confusion. Approximally 3 weeks since patient has ambulated. Left-sided facial droop. EXAM: CT CHEST WITHOUT CONTRAST; CT ABDOMEN AND PELVIS WITHOUT CONTRAST TECHNIQUE: Multidetector CT imaging of the chest, abdomen, and pelvis was performed following the standard protocol without IV contrast. RADIATION DOSE REDUCTION: This exam was performed according to the departmental dose-optimization program which includes automated exposure control, adjustment of the mA and/or kV according to patient size and/or use of iterative reconstruction technique. COMPARISON:  AP chest 06/13/2021 and 05/26/2021; pelvis and right hip radiographs 05/04/2021; CT chest 11/13/2017 FINDINGS: Cardiovascular: Heart size is mildly enlarged. No pericardial effusion. Moderate to high-grade coronary artery calcifications. No thoracic aortic aneurysm. Moderate atherosclerotic calcifications within the thoracic aorta. Mediastinum/Nodes:  No axillary, mediastinal, or hilar pathologically enlarged lymph nodes by CT criteria. Left axillary surgical clips. The right thyroid lobe is again enlarged. An approximate 2 cm low-density right thyroid lobe nodule is grossly unchanged from prior. No follow-up imaging recommended. The  esophagus follows a normal course of normal caliber. Lungs/Pleura: There is opacification of the distal right mainstem bronchus and portions of the proximal right upper lobe segmental airways. There is high-grade opacification of the posterior right upper lobe with moderate opacification of the anterior and apical segments. There is also partial opacification of multiple portions of the posterolateral right lower lobe segmental airways. Small right pleural effusion. There are heterogeneous ground-glass and centrilobular opacities within the mid to posterior right upper lobe and mid to posterior right lower lobe, presumably infectious/inflammatory process. Mild ground-glass and curvilinear likely subsegmental atelectasis and/or scarring within the posterior left lung. No pneumothorax. Musculoskeletal: Moderate multilevel disc space narrowing and high-grade anterior right bridging osteophytes throughout the thoracic spine. Vertebral body heights are maintained. Severe bilateral glenohumeral osteoarthritis. -- Lack of intra-articular fluid limits evaluation of the abdominal and pelvic organ parenchyma. The following findings are made within this limitation. Hepatobiliary: Smooth liver contours. No gross liver lesion is seen. Tiny dependent gallstone. Pancreas: Grossly unremarkable. No gross pancreatic ductal dilatation or surrounding inflammatory changes. Spleen: Normal in size without focal abnormality. Adrenals/Urinary Tract: Normal adrenals. No renal stone or hydronephrosis. Within the limitations of lack of IV contrast, no contour deforming renal mass. The urinary bladder is only minimally distended, limiting evaluation but grossly unremarkable. Stomach/Bowel: High-grade sigmoid and descending colon and moderate transverse greater than ascending colon diverticulosis with underdistention and likely chronic mild wall thickening. No definite acute surrounding inflammatory fat stranding. The terminal ileum is  unremarkable. The appendix is within normal limits. No dilated loops of bowel to indicate bowel obstruction. Vascular/Lymphatic: No abdominal aortic aneurysm. High-grade atherosclerotic calcifications. No enlarged abdominal or pelvic lymph nodes. Reproductive: The uterus appears to be surgically absent. No gross adnexal abnormality is seen. Other: No abdominal wall hernia. No free air or free fluid. Musculoskeletal: Grade 1 anterolisthesis of L4 on L5. Moderate to severe L2-3 disc space narrowing and degenerative vacuum phenomenon. IMPRESSION: Chest: 1. Distal right mainstem bronchus and portions of the proximal right upper lobe segmental airway opacification with postobstructive atelectasis greatest within the posterior segment of the right upper lobe. This may be secondary to mucosal opacification. There are airspace opacities concerning for pneumonia within the right upper and lower lobes. Small right pleural effusion. It is difficult to exclude the presence of underlying solid pulmonary nodules. Consider follow-up PA and lateral radiographs 6 weeks after treatment to ensure resolution of the right lung findings. 2. Mild cardiomegaly. Abdomen/pelvis: 1. High-grade distal and more moderate proximal diffuse colonic diverticulosis without inflammatory changes to indicate acute diverticulitis. 2. Tiny dependent gallstone. 3. Grade 1 anterolisthesis of L4 on L5, degenerative in etiology without a pars defect visualized. Aortic Atherosclerosis (ICD10-I70.0). Electronically Signed   By: Yvonne Kendall M.D.   On: 06/13/2021 12:06        Scheduled Meds:  chlorhexidine  15 mL Mouth Rinse BID   Chlorhexidine Gluconate Cloth  6 each Topical Daily   clopidogrel  75 mg Oral Q breakfast   donepezil  10 mg Oral QHS   FLUoxetine  40 mg Oral Daily   gabapentin  300 mg Oral BID   guaiFENesin  600 mg Oral BID   heparin injection (subcutaneous)  5,000 Units Subcutaneous Q8H   hydrocortisone sod succinate (  SOLU-CORTEF)  inj  100 mg Intravenous Q8H   insulin aspart  0-9 Units Subcutaneous Q4H   insulin detemir  8 Units Subcutaneous Daily   ipratropium-albuterol  3 mL Nebulization QID   latanoprost  1 drop Left Eye QHS   levETIRAcetam  250 mg Oral Daily   mouth rinse  15 mL Mouth Rinse q12n4p   pravastatin  10 mg Oral q1800   timolol  1 drop Left Eye Daily   Continuous Infusions:  sodium chloride     azithromycin Stopped (06/14/21 1137)   cefTRIAXone (ROCEPHIN)  IV Stopped (06/14/21 1001)   dextrose 5% lactated ringers 50 mL/hr at 06/15/21 0600   fluconazole (DIFLUCAN) IV     potassium PHOSPHATE IVPB (in mmol) 85 mL/hr at 06/15/21 0600     LOS: 2 days    Time spent:50 min    Nelva Hauk, Geraldo Docker, MD [facility]   If 7PM-7AM, please contact night-coverage 06/15/2021, 10:31 AM

## 2021-06-16 DIAGNOSIS — N179 Acute kidney failure, unspecified: Secondary | ICD-10-CM | POA: Diagnosis not present

## 2021-06-16 DIAGNOSIS — J189 Pneumonia, unspecified organism: Secondary | ICD-10-CM | POA: Diagnosis not present

## 2021-06-16 DIAGNOSIS — R531 Weakness: Secondary | ICD-10-CM | POA: Diagnosis not present

## 2021-06-16 DIAGNOSIS — J9621 Acute and chronic respiratory failure with hypoxia: Secondary | ICD-10-CM | POA: Diagnosis not present

## 2021-06-16 DIAGNOSIS — R197 Diarrhea, unspecified: Secondary | ICD-10-CM | POA: Diagnosis present

## 2021-06-16 LAB — CBC WITH DIFFERENTIAL/PLATELET
Abs Immature Granulocytes: 0.3 10*3/uL — ABNORMAL HIGH (ref 0.00–0.07)
Basophils Absolute: 0 10*3/uL (ref 0.0–0.1)
Basophils Relative: 0 %
Eosinophils Absolute: 0 10*3/uL (ref 0.0–0.5)
Eosinophils Relative: 0 %
HCT: 29.8 % — ABNORMAL LOW (ref 36.0–46.0)
Hemoglobin: 8.5 g/dL — ABNORMAL LOW (ref 12.0–15.0)
Immature Granulocytes: 2 %
Lymphocytes Relative: 8 %
Lymphs Abs: 1 10*3/uL (ref 0.7–4.0)
MCH: 28.9 pg (ref 26.0–34.0)
MCHC: 28.5 g/dL — ABNORMAL LOW (ref 30.0–36.0)
MCV: 101.4 fL — ABNORMAL HIGH (ref 80.0–100.0)
Monocytes Absolute: 0.5 10*3/uL (ref 0.1–1.0)
Monocytes Relative: 4 %
Neutro Abs: 11 10*3/uL — ABNORMAL HIGH (ref 1.7–7.7)
Neutrophils Relative %: 86 %
Platelets: 467 10*3/uL — ABNORMAL HIGH (ref 150–400)
RBC: 2.94 MIL/uL — ABNORMAL LOW (ref 3.87–5.11)
RDW: 13.2 % (ref 11.5–15.5)
WBC: 12.8 10*3/uL — ABNORMAL HIGH (ref 4.0–10.5)
nRBC: 0.5 % — ABNORMAL HIGH (ref 0.0–0.2)

## 2021-06-16 LAB — GLUCOSE, CAPILLARY
Glucose-Capillary: 217 mg/dL — ABNORMAL HIGH (ref 70–99)
Glucose-Capillary: 206 mg/dL — ABNORMAL HIGH (ref 70–99)
Glucose-Capillary: 155 mg/dL — ABNORMAL HIGH (ref 70–99)
Glucose-Capillary: 154 mg/dL — ABNORMAL HIGH (ref 70–99)
Glucose-Capillary: 191 mg/dL — ABNORMAL HIGH (ref 70–99)

## 2021-06-16 LAB — C DIFFICILE (CDIFF) QUICK SCRN (NO PCR REFLEX)
C Diff antigen: NEGATIVE
C Diff interpretation: NOT DETECTED
C Diff toxin: NEGATIVE

## 2021-06-16 LAB — COMPREHENSIVE METABOLIC PANEL
ALT: 10 U/L (ref 0–44)
AST: 12 U/L — ABNORMAL LOW (ref 15–41)
Albumin: 2.4 g/dL — ABNORMAL LOW (ref 3.5–5.0)
Alkaline Phosphatase: 43 U/L (ref 38–126)
Anion gap: 7 (ref 5–15)
BUN: 24 mg/dL — ABNORMAL HIGH (ref 8–23)
CO2: 33 mmol/L — ABNORMAL HIGH (ref 22–32)
Calcium: 9.1 mg/dL (ref 8.9–10.3)
Chloride: 109 mmol/L (ref 98–111)
Creatinine, Ser: 1.26 mg/dL — ABNORMAL HIGH (ref 0.44–1.00)
GFR, Estimated: 40 mL/min — ABNORMAL LOW (ref >60)
Glucose, Bld: 207 mg/dL — ABNORMAL HIGH (ref 70–99)
Potassium: 3.6 mmol/L (ref 3.5–5.1)
Sodium: 149 mmol/L — ABNORMAL HIGH (ref 135–145)
Total Bilirubin: 0.5 mg/dL (ref 0.3–1.2)
Total Protein: 5.5 g/dL — ABNORMAL LOW (ref 6.5–8.1)

## 2021-06-16 LAB — PHOSPHORUS: Phosphorus: 2.2 mg/dL — ABNORMAL LOW (ref 2.5–4.6)

## 2021-06-16 LAB — MAGNESIUM: Magnesium: 1.8 mg/dL (ref 1.7–2.4)

## 2021-06-16 MED ORDER — INSULIN DETEMIR 100 UNIT/ML ~~LOC~~ SOLN
14.0000 [IU] | Freq: Every day | SUBCUTANEOUS | Status: DC
  Administered 2021-06-16 – 2021-06-19 (×4): 14 [IU] via SUBCUTANEOUS
  Filled 2021-06-16 (×4): qty 0.14

## 2021-06-16 NOTE — Evaluation (Signed)
Clinical/Bedside Swallow Evaluation Patient Details  Name: Tiffany Hartman MRN: [id number] Date of Birth: 1927-09-12  Today's Date: 06/16/2021 Time: SLP Start Time (ACUTE ONLY): 64 SLP Stop Time (ACUTE ONLY): 4818 SLP Time Calculation (min) (ACUTE ONLY): 17 min  Past Medical History:  Past Medical History:  Diagnosis Date   Asthma    Breast cancer ([place]) 02/20/83   LT mastectomy   Bronchitis    Dementia ([place])    Diabetes mellitus ([place])    Diverticulitis 2009   Glaucoma    HTN (hypertension)    OA (osteoarthritis)    Obesity 08/17/2014   Parkinson disease ([place])    Stroke [facility])    Thyroid nodule    Dr Legrand Rams   Past Surgical History:  Past Surgical History:  Procedure Laterality Date    bilateral catracts     ABDOMINAL HYSTERECTOMY     complete   APPENDECTOMY     EUS  09/10/2011   Procedure: UPPER ENDOSCOPIC ULTRASOUND (EUS) RADIAL;  Surgeon: Arta Silence, MD;  Location: [facility];  Service: Endoscopy;  Laterality: N/A;  Tiffany Hartman/ebp   FINE NEEDLE ASPIRATION  09/10/2011   Procedure: FINE NEEDLE ASPIRATION (FNA) LINEAR;  Surgeon: Arta Silence, MD;  Location: [facility];  Service: Endoscopy;  Laterality: N/A;   FRACTURE SURGERY  2008    right and left femurs   JOINT REPLACEMENT  1998/1999   knee boths   KNEE SURGERY Right    TKA   KNEE SURGERY Left    TKA   KNEE SURGERY Right    Periprosthetic fracture /otif   MASTECTOMY  02/20/1983   left    PARS PLANA VITRECTOMY Right 06/06/2016   Procedure: VITRECTOMY WITH CULTURES AND INJECTION OF ANTIBIOTICS; ANTERIOR CHAMBER WASHOUT;  Surgeon: Jalene Mullet, MD;  Location: [place];  Service: Ophthalmology;  Laterality: Right;   HPI:  Ms. Tiffany Hartman comes from home d/t increasing generalized weakness and very poor intake. PMH includes HTN, HLD, DM2, MGUS, CKD3b, chronic hypoxic respiratory failure on 2L [place], CVA.  Parkinson's disease, dementia    Assessment / Plan / Recommendation  Clinical Impression  Pt presents with normal swallow  function, as can be determined at bedside. She was seen with regular consistency solids (despite edentulous state), purees, and thin liquids. Pt had no difficulty observed with any consistency. She stated, "I don't want any more of that" after each trial, which is consistent with performance at home. Family reports she has not had any coughing/choking/difficulty swallowing prior to admission, but that she has just had very poor appetite and frequently refuses to eat. She was unable to state any food or drink that sounded appetizing to her, but eventually agreed that she would drink a Vanilla supplement drink if it was provided. Pt is recommended to initiate a regular diet with thin liquids (in hopes of patient having free choice of menu and increasing intake). SLP service to follow up for diet tolerance and for instrumental assessment as needed d/t high risk indicators of: PNA, dementia, Parkinson's disease, assist required for oral care, COPD, bedbound.   SLP Visit Diagnosis: Dysphagia, unspecified (R13.10)    Aspiration Risk  Moderate aspiration risk    Diet Recommendation Regular;Thin liquid   Liquid Administration via: Cup;Straw Medication Administration: Whole meds with liquid Compensations: Small sips/bites;Follow solids with liquid Postural Changes: Seated upright at 90 degrees;Remain upright for at least 30 minutes after po intake    Other  Recommendations Oral Care Recommendations: Oral care BID    Recommendations for follow up  therapy are one component of a multi-disciplinary discharge planning process, led by the attending physician.  Recommendations may be updated based on patient status, additional functional criteria and insurance authorization.  Follow up Recommendations No SLP follow up      Assistance Recommended at Discharge    Functional Status Assessment Patient has had a recent decline in their functional status and demonstrates the ability to make significant  improvements in function in a reasonable and predictable amount of time.  Frequency and Duration min 1 x/week  1 week       Prognosis Prognosis for Safe Diet Advancement: Good      Swallow Study   General Date of Onset: 06/16/21 HPI: Ms. Tiffany Hartman comes from home d/t increasing generalized weakness and very poor intake. PMH includes HTN, HLD, DM2, MGUS, CKD3b, chronic hypoxic respiratory failure on 2L [place], CVA.  Parkinson's disease, dementia Type of Study: Bedside Swallow Evaluation Previous Swallow Assessment: none Diet Prior to this Study: NPO Temperature Spikes Noted: No Respiratory Status: Nasal cannula History of Recent Intubation: No Behavior/Cognition: Cooperative;Alert;Pleasant mood Oral Cavity Assessment: Within Functional Limits Oral Cavity - Dentition: Adequate natural dentition Vision: Functional for self-feeding Self-Feeding Abilities: Able to feed self Patient Positioning: Upright in bed Baseline Vocal Quality: Normal Volitional Cough: Strong Volitional Swallow: Able to elicit    Oral/Motor/Sensory Function Overall Oral Motor/Sensory Function: Other (comment) (significant tremor (baseline d/t PD))   Ice Chips Ice chips: Within functional limits Presentation: Spoon   Thin Liquid Thin Liquid: Within functional limits Presentation: Straw    Puree Puree: Within functional limits Presentation: Spoon   Solid     Solid: Within functional limits Presentation: [place]. Jaxn Chiquito, M.S., CCC-SLP Speech-Language Pathologist Acute Rehabilitation Services Pager: [place] 06/16/2021,1:46 PM

## 2021-06-16 NOTE — Progress Notes (Signed)
PROGRESS NOTE    Tiffany Hartman  AUQ:[id number] DOB: 08/31/1927 DOA: 06/13/2021 PCP: Carrolyn Meiers, MD     Brief Narrative:  88 y.o. BF PMHx  HTN, HLD, DM2, MGUS, CKD3b, chronic hypoxic respiratory failure on 2L , CVA.  Parkinson's disease, dementia  Presenting with weakness, lethargy. History from granddaughter at bedside. She reports that the patient has not eaten much at all for the last couple of weeks. She has become immobile during that time. She is not as interactive as her normal self. She has been very somnolent during this time. Her granddaughter also reports cough and congestion over the last week. The patient has not had any therapy for it. She has not have any fevers, N/V/D. She has not had any other complaints. When her condition did not improve this morning, her family decided to call EMS.    Subjective: 6/11 overnight Tmax 38.1 C.  A/O x4, states she has been having diarrhea x3 weeks.  Checked with RN Cheek who verified patient is continue to have diarrhea ranging between watery and soft greenish.    Assessment & Plan: Covid vaccination;   Principal Problem:   Multifocal pneumonia Active Problems:   Diabetes ([place])   COPD (chronic obstructive pulmonary disease) ([place])   GERD without esophagitis   Dyslipidemia   Depression with anxiety   Acute on chronic respiratory failure with hypoxia (HCC)   Stage 3b chronic kidney disease (CKD) (HCC)   MGUS (monoclonal gammopathy of unknown significance)   Glaucoma   Generalized weakness   Failure to thrive in adult   HTN (hypertension), benign   Oropharyngeal candidiasis   Black hairy tongue   Septic shock (HCC)   Acute diarrhea  Septic shock - On admission<36 C, RR> 20, hypotensive not responsive to fluid resuscitation, -See pneumonia  Multifocal PNA/Right mainstem obstruction/CAP -6/9 patient with very labile BP place arterial line - 6/9 Solu-Cortef '100mg'$  TID (stress dose) - 6/9 continue antibiotics x7  days - DuoNeb QID -BiPAP as required -Titrate O2 to maintain SPO2 89 to 95% - Incentive spirometry - Flutter valve -6/10 ABG pending  COPD  -See pneumonia  Acute on Chronic Hypoxic Respiratory Failure -See pneumonia  Oropharyngeal candidiasis - Diflucan IV 200 mg x 1 then 100 mg x 14 days  Black Hairy tongue syndrome -No treatment required.   Failure to Thrive/generalized weakness/lethargy  - dietitian consult  - PT/OT consult  Acute diarrhea - 6/11 stool culture pending - 6/11 C. difficile pending   HTN/Hypotensive/Septic Shock -6/9 hold all BP medication -6/9 currently hypotensive -6/9 Albumin 50 g x 1 - 6/9 awaiting placement of arterial line, if BP actually true after fluid resuscitation and albumin we will start pressors   DM type II controlled with hyperglycemia - 6/9 hemoglobin A1c= 5.6 CBG (last 3)  Recent Labs    06/16/21 0740 06/16/21 1126 06/16/21 1603  GLUCAP 206* 155* 154*  -6/11 increase Levemir 14 units daily -6/9 sensitive SSI       AKI on CKD stage 3b (baseline Cr 1.3--1.4) Lab Results  Component Value Date   CREATININE 1.26 (H) 06/16/2021   CREATININE 1.41 (H) 06/15/2021   CREATININE 1.60 (H) 06/14/2021   CREATININE 1.88 (H) 06/13/2021   CREATININE 1.24 (H) 05/04/2021  -Hold all nephrotoxins - CT: No obstruction -6/11 WNL       MGUS     - continue outpt follow up   HLD     - continue statin   Anxiety/depression     -  continue home regimen   Glaucoma     - continue home regimen  Hypokalemia - Potassium goal> 4 - 6/9 Potassium IV 50 mEq  Hypophosphatemia - Phosphorus goal> 2.5  Hypernatremia - D5W 68m/hr  Obesity BMI 42.7 kg/m. -  Dysphagia? - 6/10 requesting speech evaluate patient with swallow evaluation      Mobility Assessment (last 72 hours)     Mobility Assessment     Row Name 06/16/21 0800 06/15/21 0809 06/14/21 1400 06/14/21 1230 06/13/21 2230   Does patient have an order for bedrest or is patient  medically unstable Yes- Bedfast (Level 1) - Complete Yes- Bedfast (Level 1) - Complete -- -- Yes- Bedfast (Level 1) - Complete   What is the highest level of mobility based on the progressive mobility assessment? Level 1 (Bedfast) - Unable to balance while sitting on edge of bed Level 1 (Bedfast) - Unable to balance while sitting on edge of bed Level 1 (Bedfast) - Unable to balance while sitting on edge of bed Level 1 (Bedfast) - Unable to balance while sitting on edge of bed --              Interdisciplinary Goals of Care Family Meeting   Date carried out: 06/16/2021  Location of the meeting:   Member's involved:   Durable Power of ATour manager     Discussion: We discussed goals of care for LDeere & Hartman.    Code status:   Disposition:   Time spent for the meeting:     Tiffany Matty J, MD  06/16/2021, 7:39 PM         DVT prophylaxis: Subcu heparin Code Status: DNR Family Communication: 6/10 granddaughter at bedside for discussion of plan of care all questions answered Status is: Inpatient    Dispo: The patient is from: Home              Anticipated d/c is to: Home              Anticipated d/c date is: > 3 days              Patient currently is not medically stable to d/c.      Consultants:  PCCM  Procedures/Significant Events:    I have personally reviewed and interpreted all radiology studies and my findings are as above.  VENTILATOR SETTINGS: Nasal cannula 6/11 Flow 3 L/min SPO2 100%    Cultures 6/8 strep pneumo urine antigen pending 6/8 Legionella urine antigen pending 6/8 SARS coronavirus negative 6/8 influenza A/B negative 6/9 sputum pending 6/9 respiratory virus panel negative 6/11 stool culture pending 6/11 C. difficile pending   Antimicrobials: Anti-infectives (From admission, onward)    Start     Ordered Stop   06/15/21 1000  fluconazole (DIFLUCAN) IVPB 100 mg        06/14/21 1010      06/14/21 1100  fluconazole (DIFLUCAN) IVPB 200 mg        06/14/21 1010 06/14/21 1309   06/14/21 1000  cefTRIAXone (ROCEPHIN) 2 g in sodium chloride 0.9 % 100 mL IVPB        06/13/21 2010 06/19/21 0959   06/14/21 1000  azithromycin (ZITHROMAX) 500 mg in sodium chloride 0.9 % 250 mL IVPB        06/13/21 2010 06/19/21 0959   06/13/21 1215  cefTRIAXone (ROCEPHIN) 1 g in sodium chloride 0.9 % 100 mL IVPB        06/13/21 1211 06/13/21 1618  06/13/21 1215  azithromycin (ZITHROMAX) 500 mg in sodium chloride 0.9 % 250 mL IVPB        06/13/21 1211 06/13/21 1618         Devices    LINES / TUBES:      Continuous Infusions:  sodium chloride     azithromycin Stopped (06/16/21 1035)   cefTRIAXone (ROCEPHIN)  IV Stopped (06/16/21 0900)   dextrose Stopped (06/16/21 1834)   fluconazole (DIFLUCAN) IV Stopped (06/16/21 1038)     Objective: Vitals:   06/16/21 1425 06/16/21 1639 06/16/21 1654 06/16/21 1926  BP:  (!) 140/49    Pulse:      Resp:  (!) 21    Temp: 98.9 F (37.2 C)   98.2 F (36.8 C)  TempSrc: Oral   Oral  SpO2:  100% 100%   Weight:        Intake/Output Summary (Last 24 hours) at 06/16/2021 1939 Last data filed at 06/16/2021 1845 Gross per 24 hour  Intake 743.81 ml  Output 875 ml  Net -131.19 ml   Filed Weights   06/13/21 2233  Weight: 102.5 kg    Examination:  General: A/O x4, negative acute respiratory distress Eyes: negative scleral hemorrhage, negative anisocoria, negative icterus ENT: Negative Runny nose, negative gingival bleeding, Neck:  Negative scars, masses, torticollis, lymphadenopathy, JVD Lungs: decreased breath sounds bilaterally (secondary to body habitus difficult to auscultate) without wheezes or crackles Cardiovascular: Regular rate and rhythm without murmur gallop or rub normal S1 and S2 Abdomen: Morbidly OBESE, negative abdominal pain, nondistended, positive soft, bowel sounds, no rebound, no ascites, no appreciable mass Extremities: No  significant cyanosis, clubbing, or edema bilateral lower extremities Skin: Negative rashes, lesions, ulcers Psychiatric:  Negative depression, negative anxiety, negative fatigue, negative mania  Central nervous system:  Cranial nerves II through XII intact, tongue/uvula midline, all extremities muscle strength 5/5, sensation intact throughout, negative dysarthria, negative expressive aphasia, negative receptive aphasia.  .     Data Reviewed: Care during the described time interval was provided by me .  I have reviewed this patient's available data, including medical history, events of note, physical examination, and all test results as part of my evaluation.  CBC: Recent Labs  Lab 06/13/21 0856 06/14/21 0313 06/15/21 0300 06/16/21 0250  WBC 11.0* 9.7 10.9* 12.8*  NEUTROABS 9.0*  --  9.7* 11.0*  HGB 10.4* 9.7* 9.1* 8.5*  HCT 37.6 35.0* 32.1* 29.8*  MCV 107.1* 107.0* 104.6* 101.4*  PLT 575* 496* 465* 962*   Basic Metabolic Panel: Recent Labs  Lab 06/13/21 0856 06/14/21 0313 06/15/21 0300 06/16/21 0250  NA 147* 146* 150* 149*  K 3.6 3.3* 3.7 3.6  CL 103 106 111 109  CO2 32 31 33* 33*  GLUCOSE 71 276* 154* 207*  BUN 39* 36* 31* 24*  CREATININE 1.88* 1.60* 1.41* 1.26*  CALCIUM 9.1 9.1 9.6 9.1  MG  --   --  2.0 1.8  PHOS  --   --  1.6* 2.2*   GFR: CrCl cannot be calculated (Unknown ideal weight.). Liver Function Tests: Recent Labs  Lab 06/13/21 0856 06/14/21 0313 06/15/21 0300 06/16/21 0250  AST 12* 9* 10* 12*  ALT '8 8 10 10  '$ ALKPHOS 61 50 43 43  BILITOT 0.9 0.6 0.4 0.5  PROT 6.0* 5.5* 5.7* 5.5*  ALBUMIN 2.3* 2.2* 2.6* 2.4*   Recent Labs  Lab 06/13/21 0856  LIPASE 36   Recent Labs  Lab 06/13/21 0856  AMMONIA 36*   Coagulation Profile: No  results for input(s): "INR", "PROTIME" in the last 168 hours. Cardiac Enzymes: No results for input(s): "CKTOTAL", "CKMB", "CKMBINDEX", "TROPONINI" in the last 168 hours. BNP (last 3 results) No results for  input(s): "PROBNP" in the last 8760 hours. HbA1C: Recent Labs    06/13/21 2308  HGBA1C 5.6   CBG: Recent Labs  Lab 06/15/21 2323 06/16/21 0418 06/16/21 0740 06/16/21 1126 06/16/21 1603  GLUCAP 182* 217* 206* 155* 154*   Lipid Profile: No results for input(s): "CHOL", "HDL", "LDLCALC", "TRIG", "CHOLHDL", "LDLDIRECT" in the last 72 hours. Thyroid Function Tests: No results for input(s): "TSH", "T4TOTAL", "FREET4", "T3FREE", "THYROIDAB" in the last 72 hours. Anemia Panel: No results for input(s): "VITAMINB12", "FOLATE", "FERRITIN", "TIBC", "IRON", "RETICCTPCT" in the last 72 hours. Sepsis Labs: Recent Labs  Lab 06/13/21 0907 06/13/21 1230  PROCALCITON  --  0.53  LATICACIDVEN 0.7  --     Recent Results (from the past 240 hour(s))  SARS Coronavirus 2 by RT PCR (hospital order, performed in [facility] hospital lab) *cepheid single result test* Anterior Nasal Swab     Status: None   Collection Time: 06/13/21  9:59 AM   Specimen: Anterior Nasal Swab  Result Value Ref Range Status   SARS Coronavirus 2 by RT PCR NEGATIVE NEGATIVE Final    Comment: (NOTE) SARS-CoV-2 target nucleic acids are NOT DETECTED.  The SARS-CoV-2 RNA is generally detectable in upper and lower respiratory specimens during the acute phase of infection. The lowest concentration of SARS-CoV-2 viral copies this assay can detect is 250 copies / mL. A negative result does not preclude SARS-CoV-2 infection and should not be used as the sole basis for treatment or other patient management decisions.  A negative result may occur with improper specimen collection / handling, submission of specimen other than nasopharyngeal swab, presence of viral mutation(s) within the areas targeted by this assay, and inadequate number of viral copies (<250 copies / mL). A negative result must be combined with clinical observations, patient history, and epidemiological information.  Fact Sheet for Patients:    https://www.patel.info/  Fact Sheet for Healthcare Providers: https://hall.com/  This test is not yet approved or  cleared by the [place] FDA and has been authorized for detection and/or diagnosis of SARS-CoV-2 by FDA under an Emergency Use Authorization (EUA).  This EUA will remain in effect (meaning this test can be used) for the duration of the COVID-19 declaration under Section 564(b)(1) of the Act, 21 U.S.C. section 360bbb-3(b)(1), unless the authorization is terminated or revoked sooner.  Performed at [facility], [place] [address] [place], [place] [address]   Culture, blood (routine x 2)     Status: None (Preliminary result)   Collection Time: 06/13/21 12:30 PM   Specimen: BLOOD RIGHT HAND  Result Value Ref Range Status   Specimen Description   Final    BLOOD RIGHT HAND Performed at [place] [address] [place], [place] [address]    Special Requests   Final    BOTTLES DRAWN AEROBIC AND ANAEROBIC Blood Culture results may not be optimal due to an inadequate volume of blood received in culture bottles Performed at [place] [address], [place], [place] [address]    Culture   Final    NO GROWTH 3 DAYS Performed at [facility], [place] [address] [place],  [address]    Report Status PENDING  Incomplete  Culture, blood (routine x 2)     Status: None (Preliminary  result)   Collection Time: 06/13/21 12:30 PM   Specimen: BLOOD RIGHT HAND  Result Value Ref Range Status   Specimen Description   Final    BLOOD RIGHT HAND Performed at [place] [address], [place], [place] [address]    Special Requests   Final    BOTTLES DRAWN AEROBIC AND ANAEROBIC Blood Culture adequate volume Performed at [place] [address] [place], [place] [address]    Culture   Final    NO GROWTH 3 DAYS Performed at  [facility], [place] [address] [place], [place] [address]    Report Status PENDING  Incomplete  MRSA Next Gen by PCR, Nasal     Status: None   Collection Time: 06/14/21  1:31 AM   Specimen: Nasal Mucosa; Nasal Swab  Result Value Ref Range Status   MRSA by PCR Next Gen NOT DETECTED NOT DETECTED Final    Comment: (NOTE) The GeneXpert MRSA Assay (FDA approved for NASAL specimens only), is one component of a comprehensive MRSA colonization surveillance program. It is not intended to diagnose MRSA infection nor to guide or monitor treatment for MRSA infections. Test performance is not FDA approved in patients less than 24 years old. Performed at [facility], [place] [address] [place], [place] [address]   Respiratory (~20 pathogens) panel by PCR     Status: None   Collection Time: 06/14/21  7:46 AM   Specimen: Nasopharyngeal Swab; Respiratory  Result Value Ref Range Status   Adenovirus NOT DETECTED NOT DETECTED Final   Coronavirus 229E NOT DETECTED NOT DETECTED Final    Comment: (NOTE) The Coronavirus on the Respiratory Panel, DOES NOT test for the novel  Coronavirus (2019 nCoV)    Coronavirus HKU1 NOT DETECTED NOT DETECTED Final   Coronavirus NL63 NOT DETECTED NOT DETECTED Final   Coronavirus OC43 NOT DETECTED NOT DETECTED Final   Metapneumovirus NOT DETECTED NOT DETECTED Final   Rhinovirus / Enterovirus NOT DETECTED NOT DETECTED Final   Influenza A NOT DETECTED NOT DETECTED Final   Influenza B NOT DETECTED NOT DETECTED Final   Parainfluenza Virus 1 NOT DETECTED NOT DETECTED Final   Parainfluenza Virus 2 NOT DETECTED NOT DETECTED Final   Parainfluenza Virus 3 NOT DETECTED NOT DETECTED Final   Parainfluenza Virus 4 NOT DETECTED NOT DETECTED Final   Respiratory Syncytial Virus NOT DETECTED NOT DETECTED Final   Bordetella pertussis NOT DETECTED NOT DETECTED Final   Bordetella Parapertussis NOT DETECTED NOT DETECTED Final   Chlamydophila pneumoniae NOT DETECTED  NOT DETECTED Final   Mycoplasma pneumoniae NOT DETECTED NOT DETECTED Final    Comment: Performed at [facility], [place]. [address], [place], [place] [address]         Radiology Studies: No results found.      Scheduled Meds:  chlorhexidine  15 mL Mouth Rinse BID   Chlorhexidine Gluconate Cloth  6 each Topical Daily   clopidogrel  75 mg Oral Q breakfast   donepezil  10 mg Oral QHS   FLUoxetine  40 mg Oral Daily   gabapentin  300 mg Oral BID   guaiFENesin  600 mg Oral BID   heparin injection (subcutaneous)  5,000 Units Subcutaneous Q8H   hydrocortisone sod succinate (SOLU-CORTEF) inj  100 mg Intravenous Q8H   insulin aspart  0-9 Units Subcutaneous Q4H   insulin detemir  14 Units Subcutaneous Daily   ipratropium-albuterol  3 mL Nebulization QID   latanoprost  1 drop Left Eye QHS   levETIRAcetam  250 mg Oral Daily   mouth rinse  15 mL Mouth Rinse q12n4p   pravastatin  10 mg Oral q1800   timolol  1 drop Left Eye Daily   Continuous Infusions:  sodium chloride     azithromycin Stopped (06/16/21 1035)   cefTRIAXone (ROCEPHIN)  IV Stopped (06/16/21 0900)   dextrose Stopped (06/16/21 1834)   fluconazole (DIFLUCAN) IV Stopped (06/16/21 1038)     LOS: 3 days    Time spent:50 min    Ada Woodbury, Geraldo Docker, MD [facility]   If 7PM-7AM, please contact night-coverage 06/16/2021, 7:39 PM

## 2021-06-16 NOTE — Progress Notes (Signed)
Pt A&O to self only at beginning of shift.  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

## 2021-06-17 DIAGNOSIS — J189 Pneumonia, unspecified organism: Secondary | ICD-10-CM | POA: Diagnosis not present

## 2021-06-17 DIAGNOSIS — R4182 Altered mental status, unspecified: Secondary | ICD-10-CM | POA: Diagnosis present

## 2021-06-17 DIAGNOSIS — G20 Parkinson's disease: Secondary | ICD-10-CM

## 2021-06-17 DIAGNOSIS — J9621 Acute and chronic respiratory failure with hypoxia: Secondary | ICD-10-CM | POA: Diagnosis not present

## 2021-06-17 DIAGNOSIS — R531 Weakness: Secondary | ICD-10-CM | POA: Diagnosis not present

## 2021-06-17 DIAGNOSIS — N179 Acute kidney failure, unspecified: Secondary | ICD-10-CM | POA: Diagnosis not present

## 2021-06-17 LAB — CBC WITH DIFFERENTIAL/PLATELET
Abs Immature Granulocytes: 0.42 10*3/uL — ABNORMAL HIGH (ref 0.00–0.07)
Basophils Absolute: 0 10*3/uL (ref 0.0–0.1)
Basophils Relative: 0 %
Eosinophils Absolute: 0 10*3/uL (ref 0.0–0.5)
Eosinophils Relative: 0 %
HCT: 30.8 % — ABNORMAL LOW (ref 36.0–46.0)
Hemoglobin: 9 g/dL — ABNORMAL LOW (ref 12.0–15.0)
Immature Granulocytes: 3 %
Lymphocytes Relative: 7 %
Lymphs Abs: 1.1 10*3/uL (ref 0.7–4.0)
MCH: 29.5 pg (ref 26.0–34.0)
MCHC: 29.2 g/dL — ABNORMAL LOW (ref 30.0–36.0)
MCV: 101 fL — ABNORMAL HIGH (ref 80.0–100.0)
Monocytes Absolute: 0.5 10*3/uL (ref 0.1–1.0)
Monocytes Relative: 3 %
Neutro Abs: 13 10*3/uL — ABNORMAL HIGH (ref 1.7–7.7)
Neutrophils Relative %: 87 %
Platelets: 429 10*3/uL — ABNORMAL HIGH (ref 150–400)
RBC: 3.05 MIL/uL — ABNORMAL LOW (ref 3.87–5.11)
RDW: 13.4 % (ref 11.5–15.5)
WBC: 15.1 10*3/uL — ABNORMAL HIGH (ref 4.0–10.5)
nRBC: 0.4 % — ABNORMAL HIGH (ref 0.0–0.2)

## 2021-06-17 LAB — COMPREHENSIVE METABOLIC PANEL
ALT: 11 U/L (ref 0–44)
AST: 14 U/L — ABNORMAL LOW (ref 15–41)
Albumin: 2.7 g/dL — ABNORMAL LOW (ref 3.5–5.0)
Alkaline Phosphatase: 38 U/L (ref 38–126)
Anion gap: 7 (ref 5–15)
BUN: 20 mg/dL (ref 8–23)
CO2: 34 mmol/L — ABNORMAL HIGH (ref 22–32)
Calcium: 8.9 mg/dL (ref 8.9–10.3)
Chloride: 104 mmol/L (ref 98–111)
Creatinine, Ser: 1.07 mg/dL — ABNORMAL HIGH (ref 0.44–1.00)
GFR, Estimated: 49 mL/min — ABNORMAL LOW (ref >60)
Glucose, Bld: 179 mg/dL — ABNORMAL HIGH (ref 70–99)
Potassium: 3.2 mmol/L — ABNORMAL LOW (ref 3.5–5.1)
Sodium: 145 mmol/L (ref 135–145)
Total Bilirubin: 0.5 mg/dL (ref 0.3–1.2)
Total Protein: 5.6 g/dL — ABNORMAL LOW (ref 6.5–8.1)

## 2021-06-17 LAB — GLUCOSE, CAPILLARY
Glucose-Capillary: 136 mg/dL — ABNORMAL HIGH (ref 70–99)
Glucose-Capillary: 139 mg/dL — ABNORMAL HIGH (ref 70–99)
Glucose-Capillary: 175 mg/dL — ABNORMAL HIGH (ref 70–99)
Glucose-Capillary: 182 mg/dL — ABNORMAL HIGH (ref 70–99)
Glucose-Capillary: 187 mg/dL — ABNORMAL HIGH (ref 70–99)
Glucose-Capillary: 194 mg/dL — ABNORMAL HIGH (ref 70–99)
Glucose-Capillary: 209 mg/dL — ABNORMAL HIGH (ref 70–99)

## 2021-06-17 LAB — PHOSPHORUS: Phosphorus: 2 mg/dL — ABNORMAL LOW (ref 2.5–4.6)

## 2021-06-17 LAB — MAGNESIUM: Magnesium: 2 mg/dL (ref 1.7–2.4)

## 2021-06-17 MED ORDER — POTASSIUM PHOSPHATES 15 MMOLE/5ML IV SOLN
30.0000 mmol | Freq: Once | INTRAVENOUS | Status: AC
  Administered 2021-06-17: 30 mmol via INTRAVENOUS
  Filled 2021-06-17: qty 10

## 2021-06-17 MED ORDER — SODIUM CHLORIDE 0.9 % IV SOLN: INTRAVENOUS | Status: DC | PRN

## 2021-06-17 MED ORDER — POTASSIUM CHLORIDE CRYS ER 20 MEQ PO TBCR
40.0000 meq | EXTENDED_RELEASE_TABLET | Freq: Once | ORAL | Status: AC
  Administered 2021-06-17: 40 meq via ORAL
  Filled 2021-06-17: qty 2

## 2021-06-17 MED ORDER — HYDROCORTISONE SOD SUC (PF) 100 MG IJ SOLR
100.0000 mg | Freq: Two times a day (BID) | INTRAMUSCULAR | Status: DC
Start: 2021-06-17 — End: 2021-06-18
  Administered 2021-06-17: 100 mg via INTRAVENOUS
  Filled 2021-06-17: qty 2

## 2021-06-17 MED ORDER — POTASSIUM PHOSPHATES 15 MMOLE/5ML IV SOLN: 15.0000 mmol | Freq: Once | INTRAVENOUS | Status: DC

## 2021-06-17 MED ORDER — ORAL CARE MOUTH RINSE
15.0000 mL | Freq: Two times a day (BID) | OROMUCOSAL | Status: DC
  Administered 2021-06-17 – 2021-06-21 (×9): 15 mL via OROMUCOSAL

## 2021-06-17 MED ORDER — INSULIN ASPART 100 UNIT/ML IJ SOLN
0.0000 [IU] | INTRAMUSCULAR | Status: DC
  Administered 2021-06-17: 3 [IU] via SUBCUTANEOUS
  Administered 2021-06-17: 2 [IU] via SUBCUTANEOUS
  Administered 2021-06-17 (×2): 3 [IU] via SUBCUTANEOUS
  Administered 2021-06-18 (×2): 2 [IU] via SUBCUTANEOUS
  Administered 2021-06-18 (×3): 3 [IU] via SUBCUTANEOUS
  Administered 2021-06-18: 2 [IU] via SUBCUTANEOUS
  Administered 2021-06-19: 3 [IU] via SUBCUTANEOUS
  Administered 2021-06-19 (×2): 2 [IU] via SUBCUTANEOUS

## 2021-06-17 NOTE — TOC Initial Note (Signed)
Transition of Care [facility]) - Initial/Assessment Note    Patient Details  Name: Tiffany Hartman MRN: [id number] Date of Birth: [birth year]  Transition of Care [facility]) CM/SW Contact:    Lennart Pall, LCSW Phone Number: 06/17/2021, 1:09 PM  Clinical Narrative:                 Met with pt, daughter Deneise Lever) and daughter-in-law today to introduce self/ TOC role and discuss dc planning needs.  Family very engaged and confirming they are aware of therapy recommendations for SNF/ rehab and in agreement with this plan.  Daughter reports pt has been to [facility] in Jan/Feb 2020 and this would be their preferred facility if possible.  TOC will follow along and await medical readiness to begin bed search.  Expected Discharge Plan: Skilled Nursing Facility Barriers to Discharge: Continued Medical Work up   Patient Goals and CMS Choice Patient states their goals for this hospitalization and ongoing recovery are:: to return home following rehab   Choice offered to / list presented to : Patient, Adult Children  Expected Discharge Plan and Services Expected Discharge Plan: [place] In-house Referral: Clinical Social Work   Post Acute Care Choice: [place] Living arrangements for the past 2 months: [place]                                      Prior Living Arrangements/Services Living arrangements for the past 2 months: Single Family Home Lives with:: Adult Children Patient language and need for interpreter reviewed:: Yes Do you feel safe going back to the place where you live?: Yes      Need for Family Participation in Patient Care: Yes (Comment) Care giver support system in place?: Yes (comment)      Activities of Daily Living Home Assistive Devices/Equipment: Oxygen ADL Screening (condition at time of admission) Patient's cognitive ability adequate to safely complete daily activities?: No Is the patient deaf or have difficulty hearing?:  Yes Does the patient have difficulty seeing, even when wearing glasses/contacts?: Yes Does the patient have difficulty concentrating, remembering, or making decisions?: Yes Patient able to express need for assistance with ADLs?: No Does the patient have difficulty dressing or bathing?: Yes Independently performs ADLs?: No Communication: Independent Dressing (OT): Needs assistance Is this a change from baseline?: Pre-admission baseline Grooming: Needs assistance Is this a change from baseline?: Pre-admission baseline Feeding: Needs assistance Is this a change from baseline?: Pre-admission baseline Bathing: Needs assistance Is this a change from baseline?: Pre-admission baseline Toileting: Needs assistance Is this a change from baseline?: Pre-admission baseline In/Out Bed: Needs assistance Is this a change from baseline?: Pre-admission baseline Walks in Home: Needs assistance Is this a change from baseline?: Pre-admission baseline Does the patient have difficulty walking or climbing stairs?: Yes Weakness of Legs: Both Weakness of Arms/Hands: Both  Permission Sought/Granted Permission sought to share information with : Facility Sport and exercise psychologist, Family Supports Permission granted to share information with : Yes, Verbal Permission Granted  Share Information with NAME: Jarrod Bodkins     Permission granted to share info w Relationship: daughter  Permission granted to share info w Contact Information: [phone]  Emotional Assessment   Attitude/Demeanor/Rapport: Lethargic Affect (typically observed): Flat Orientation: : Oriented to Self, Oriented to Place, Oriented to Situation Alcohol / Substance Use: Not Applicable Psych Involvement: No (comment)  Admission diagnosis:  Generalized weakness [R53.1] AKI (acute kidney injury) (  [place]) [N17.9] Multifocal pneumonia [J18.9] Patient Active Problem List   Diagnosis Date Noted   Altered mental status 06/17/2021   Acute diarrhea  06/16/2021   Oropharyngeal candidiasis 06/14/2021   Black hairy tongue 06/14/2021   Septic shock ([place]) 06/14/2021   Multifocal pneumonia 06/13/2021   Acute on chronic respiratory failure with hypoxia ([place]) 06/13/2021   Stage 3b chronic kidney disease (CKD) ([place]) 06/13/2021   MGUS (monoclonal gammopathy of unknown significance) 06/13/2021   Glaucoma 06/13/2021   Generalized weakness 06/13/2021   Failure to thrive in adult 06/13/2021   HTN (hypertension), benign 06/13/2021   GERD without esophagitis 03/01/2018   Dyslipidemia 03/01/2018   Chronic anemia 03/01/2018   Bilateral lower extremity edema 03/01/2018   Depression with anxiety 03/01/2018   COPD (chronic obstructive pulmonary disease) ([place]) 02/18/2018   Fracture of distal end of right femur ([place]) 02/12/18 02/13/2018   Asthma exacerbation 01/31/2015   Asthma with status asthmaticus 01/31/2015   Asthma    Chest pain 08/17/2014   Dyspnea 08/17/2014   Obesity 08/17/2014   Diabetes ([place]) 08/17/2014   Dizziness 12/27/2013   Stroke risk 12/27/2013   Stroke-like episode 12/27/2013   History of stroke 08/10/2012   Parkinsons disease ([place]) 08/10/2012   Left sided numbness 08/10/2012   Left hemiparesis ([place]) 08/10/2012   Dysarthria 08/10/2012   Seizure disorder ([place]) 08/10/2012   Other and unspecified hyperlipidemia 08/10/2012   Sciatica neuralgia 06/02/2012   DDD (degenerative disc disease), lumbar 06/02/2012   Hip pain 06/02/2012   Knee pain 06/02/2012   Pes anserinus bursitis 06/02/2012   SCIATICA 10/01/2009   SHOULDER, ARTHRITIS, DEGEN./OSTEO 11/27/2008   KNEE PAIN 07/26/2007   SHOULDER PAIN 12/01/2006   History of cardiovascular disorder 12/01/2006   PCP:  Carrolyn Meiers, MD Pharmacy:   [id number] [place] [address] [place]Phone: [phone]: [phone]    Social Determinants of Health (SDOH) Interventions    Readmission  Risk Interventions    06/17/2021    1:05 PM  Readmission Risk Prevention Plan  Transportation Screening Complete  PCP or Specialist Appt within 3-5 Days Complete  [place] or [place]  Social Work [place] for [place]/Counseling Complete  Palliative Care Screening Not Applicable  Medication Review (Press photographer Complete

## 2021-06-17 NOTE — Progress Notes (Signed)
Occupational Therapy Treatment Patient Details Name: Tiffany Hartman MRN: [id number] DOB: 09-11-1927 Today's Date: 06/17/2021   History of present illness Pt is 88 yo female presenting with weakness, lethargy. PMH includes HTN, HLD, DM2, MGUS, CKD3b, chronic hypoxic respiratory failure on 2L [place], CVA.   OT comments  Patient was noted to be lethargic with noted falling asleep during session when not engaging with therapist. Patient was max A to engage in wiping mouth with oral swabbs and rinsing with noted shakiness in BUE. Patient declined further mobility at this time stating she needed to have BM right where she was. Nursing team made aware. Patient's discharge plan remains appropriate at this time. OT Hartman continue to follow acutely.     Recommendations for follow up therapy are one component of a multi-disciplinary discharge planning process, led by the attending physician.  Recommendations may be updated based on patient status, additional functional criteria and insurance authorization.    Follow Up Recommendations  Skilled nursing-short term rehab (<3 hours/day)    Assistance Recommended at Discharge Frequent or constant Supervision/Assistance  Patient can return home with the following  Two people to help with walking and/or transfers;Two people to help with bathing/dressing/bathroom;Assistance with cooking/housework;Assistance with feeding;Direct supervision/assist for medications management;Direct supervision/assist for financial management;Assist for transportation;Help with stairs or ramp for entrance   Equipment Recommendations       Recommendations for Other Services      Precautions / Restrictions Precautions Precautions: Fall Precaution Comments: family reports pt slid to the floor in the bathroom prior to admission, but prior to getting sick she didn't have falls Restrictions Weight Bearing Restrictions: No Other Position/Activity Restrictions: tremors BUE        Mobility Bed Mobility               General bed mobility comments: patient declined bed mobility on this date.    Transfers                         Balance                                           ADL either performed or assessed with clinical judgement   ADL Overall ADL's : Needs assistance/impaired     Grooming: Maximal assistance Grooming Details (indicate cue type and reason): patient was provided with oral sponge to clean mouth with patient bringing to mouth and rollign around with tounge v.s. attempting to wipe around mouth with UE. patient noted to have increased BUE shakiness with all movements on this date. unable to clarify if this is typical for patient.                               General ADL Comments: patient declined to sit up forwards in bed further than postiion for oral swabbs. patient reported that she needed to have a BM. patient declined to get onto bed pan with nursing made aware. patient recliend to use BUE to attempt to sit more forwards in bed to assist with passing as well. patient reported " i need to do this right here". nurse made aware.    Extremity/Trunk Assessment Upper Extremity Assessment LUE Deficits / Details: patient noted to have BUE shakiness with all movements on this date.  Vision       Perception     Praxis      Cognition Arousal/Alertness: Lethargic Behavior During Therapy: Flat affect Overall Cognitive Status: Impaired/Different from baseline                                 General Comments: patient was noted to have garbled speech at times.        Exercises      Shoulder Instructions       General Comments      Pertinent Vitals/ Pain       Pain Assessment Pain Assessment: Faces Faces Pain Scale: No hurt  Home Living                                          Prior Functioning/Environment               Frequency  Min 2X/week        Progress Toward Goals  OT Goals(current goals can now be found in the care plan section)  Progress towards OT goals: OT to reassess next treatment     Plan Discharge plan remains appropriate    Co-evaluation                 AM-PAC OT "6 Clicks" Daily Activity     Outcome Measure   Help from another person eating meals?: Total Help from another person taking care of personal grooming?: A Lot Help from another person toileting, which includes using toliet, bedpan, or urinal?: Total Help from another person bathing (including washing, rinsing, drying)?: Total Help from another person to put on and taking off regular upper body clothing?: Total Help from another person to put on and taking off regular lower body clothing?: Total 6 Click Score: 7    End of Session Equipment Utilized During Treatment: Oxygen  OT Visit Diagnosis: Muscle weakness (generalized) (M62.81);Pain;Other symptoms and signs involving cognitive function;History of falling (Z91.81);Other abnormalities of gait and mobility (R26.89);Unsteadiness on feet (R26.81)   Activity Tolerance Patient limited by lethargy   Patient Left in bed;with call bell/phone within reach;with bed alarm set   Nurse Communication Other (comment) (patient needing to have BM and declining to move further until it has passed)        Time: 3491-7915 OT Time Calculation (min): 18 min  Charges: OT General Charges $OT Visit: 1 Visit OT Treatments $Self Care/Home Management : 8-22 mins  Jackelyn Poling OTR/L, MS Acute Rehabilitation Department Office# [phone] Pager# [phone]   Marcellina Millin 06/17/2021, 2:27 PM

## 2021-06-17 NOTE — Progress Notes (Signed)
While getting patient out of the bed, patient demonstrated minimal effort to sit on the side of the bed and stand. Patient required maximum support from nursing staff to transfer from bed to chair. Lift pad placed in chair for future transfer.

## 2021-06-17 NOTE — Progress Notes (Addendum)
PROGRESS NOTE    Tiffany Hartman  ZOX:[id number] DOB: Feb 13, 1927 DOA: 06/13/2021 PCP: Carrolyn Meiers, MD     Brief Narrative:  88 y.o. BF PMHx  HTN, HLD, DM2, MGUS, CKD3b, chronic hypoxic respiratory failure on 2L [place], CVA.  Parkinson's disease, dementia  Presenting with weakness, lethargy. History from granddaughter at bedside. She reports that the patient has not eaten much at all for the last couple of weeks. She has become immobile during that time. She is not as interactive as her normal self. She has been very somnolent during this time. Her granddaughter also reports cough and congestion over the last week. The patient has not had any therapy for it. She has not have any fevers, N/V/D. She has not had any other complaints. When her condition did not improve this morning, her family decided to call EMS.    Subjective: 6/12 afebrile overnight A/O x4.  States approximately 4 to 5 years ago was seeing a Parkinson's neurologist however has not seen them since then because was told she really did not have Parkinson's disease.     Assessment & Plan: Covid vaccination;   Principal Problem:   Multifocal pneumonia Active Problems:   Diabetes ([place])   COPD (chronic obstructive pulmonary disease) ([place])   GERD without esophagitis   Dyslipidemia   Depression with anxiety   Acute on chronic respiratory failure with hypoxia (HCC)   Stage 3b chronic kidney disease (CKD) (HCC)   MGUS (monoclonal gammopathy of unknown significance)   Glaucoma   Generalized weakness   Failure to thrive in adult   HTN (hypertension), benign   Oropharyngeal candidiasis   Black hairy tongue   Septic shock (HCC)   Acute diarrhea   Altered mental status  Septic shock - On admission<36 C, RR> 20, hypotensive not responsive to fluid resuscitation, -See pneumonia  Multifocal PNA/Right mainstem obstruction/CAP -6/9 patient with very labile BP place arterial line - 6/9 Solu-Cortef '100mg'$  TID (stress  dose) - 6/9 continue antibiotics x7 days - DuoNeb QID -BiPAP as required -Titrate O2 to maintain SPO2 89 to 95% - Incentive spirometry - Flutter valve -6/10 ABG pending -6/12 decrease Solu-Cortef 100 mg BID -6/12 ambulate patient in hallway q shift - Out of bed to chair q shift   COPD  -See pneumonia  Acute on Chronic Hypoxic Respiratory Failure -See pneumonia  Oropharyngeal candidiasis - Diflucan IV 200 mg x 1 then 100 mg x 14 days  Leukocytosis -Patient on steroids  Black Hairy tongue syndrome -No treatment required.   Failure to Thrive/generalized weakness/lethargy  - dietitian consult  - PT/OT consult  Acute diarrhea - 6/11 stool culture pending - 6/11 C. Difficile negative   HTN/Hypotensive/Septic Shock -6/9 hold all BP medication -6/9 currently hypotensive -6/9 Albumin 50 g x 1 - 6/9 awaiting placement of arterial line, if BP actually true after fluid resuscitation and albumin we will start pressors -6/12 septic shock  Altered mental status - Minimize sedating medication benzodiazepine, narcotics. - 6/12 resolved   DM type II controlled with hyperglycemia - 6/9 hemoglobin A1c= 5.6 CBG (last 3)  Recent Labs    06/17/21 0307 06/17/21 0735 06/17/21 1130  GLUCAP 175* 209* 187*  -6/11 increase Levemir 14 units daily - 6/12 increase moderate SSI        AKI on CKD stage 3b (baseline Cr 1.3--1.4) Lab Results  Component Value Date   CREATININE 1.07 (H) 06/17/2021   CREATININE 1.26 (H) 06/16/2021   CREATININE 1.41 (H) 06/15/2021   CREATININE  1.60 (H) 06/14/2021   CREATININE 1.88 (H) 06/13/2021  -Hold all nephrotoxins - CT: No obstruction -6/11 WNL       MGUS     - continue outpt follow up   HLD -Pravastatin 10 mg daily - Lipid panel pending   Anxiety/depression     - continue home regimen  Parkinson disease - Patient has not seen Parkinson's neurologist in 4 to 5 years however does have signs and symptoms consistent with Parkinson's. -  6/12 now that patient's cognition has improved consult neurology for evaluation for Parkinson's and recommendation on starting medication -6/12 spoke with Dr. Theda Sers Neurology who will evaluate patient, if unable to evaluate patient stated he will ensure she has a outpatient appointment with movement disorder specialist.  Will await recommendations.   Glaucoma     - continue home regimen  Hypokalemia - Potassium goal> 4 - 6/12 see hypophosphatemia  Hypophosphatemia - Phosphorus goal> 2.5 -6/12 K-Phos 30 mmol  Hypernatremia - D5W 39m/hr  Obesity BMI 42.7 kg/m. -  Dysphagia - 6/10 requesting speech evaluate; high risk for aspiration   Goals of care - 6/12 PT/OT consult; morbidly obese patient with pneumonia, deconditioned would benefit from physical therapy.  Evaluate for CIR vs SNF      Mobility Assessment (last 72 hours)     Mobility Assessment     Row Name 06/16/21 0800 06/15/21 0809 06/14/21 1400       Does patient have an order for bedrest or is patient medically unstable Yes- Bedfast (Level 1) - Complete Yes- Bedfast (Level 1) - Complete --     What is the highest level of mobility based on the progressive mobility assessment? Level 1 (Bedfast) - Unable to balance while sitting on edge of bed Level 1 (Bedfast) - Unable to balance while sitting on edge of bed Level 1 (Bedfast) - Unable to balance while sitting on edge of bed                Interdisciplinary Goals of Care Family Meeting   Date carried out: 06/17/2021  Location of the meeting:   Member's involved:   Durable Power of ATour manager     Discussion: We discussed goals of care for [name].    Code status:   Disposition:   Time spent for the meeting:     Tiffany Hartman J, MD  06/17/2021, 12:33 PM         DVT prophylaxis: Subcu heparin Code Status: DNR Family Communication: 6/10 granddaughter at bedside for discussion of plan of care all  questions answered Status is: Inpatient    Dispo: The patient is from: Home              Anticipated d/c is to: Home              Anticipated d/c date is: > 3 days              Patient currently is not medically stable to d/c.      Consultants:  PCCM  Procedures/Significant Events:    I have personally reviewed and interpreted all radiology studies and my findings are as above.  VENTILATOR SETTINGS: Nasal cannula 6/12 Flow 3 L/min SPO2 97%    Cultures 6/8 strep pneumo urine antigen pending 6/8 Legionella urine antigen pending 6/8 SARS coronavirus negative 6/8 influenza A/B negative 6/9 sputum pending 6/9 respiratory virus panel negative 6/11 stool culture pending 6/11 C. Difficile Antigen/C. difficile toxin negative  Antimicrobials: Anti-infectives (From admission, onward)    Start     Ordered Stop   06/15/21 1000  fluconazole (DIFLUCAN) IVPB 100 mg        06/14/21 1010     06/14/21 1100  fluconazole (DIFLUCAN) IVPB 200 mg        06/14/21 1010 06/14/21 1309   06/14/21 1000  cefTRIAXone (ROCEPHIN) 2 g in sodium chloride 0.9 % 100 mL IVPB        06/13/21 2010 06/19/21 0959   06/14/21 1000  azithromycin (ZITHROMAX) 500 mg in sodium chloride 0.9 % 250 mL IVPB        06/13/21 2010 06/19/21 0959   06/13/21 1215  cefTRIAXone (ROCEPHIN) 1 g in sodium chloride 0.9 % 100 mL IVPB        06/13/21 1211 06/13/21 1618   06/13/21 1215  azithromycin (ZITHROMAX) 500 mg in sodium chloride 0.9 % 250 mL IVPB        06/13/21 1211 06/13/21 1618         Devices    LINES / TUBES:      Continuous Infusions:  sodium chloride     sodium chloride     azithromycin Stopped (06/16/21 1035)   cefTRIAXone (ROCEPHIN)  IV Stopped (06/16/21 0900)   dextrose Stopped (06/17/21 0051)   fluconazole (DIFLUCAN) IV Stopped (06/16/21 1034)   potassium PHOSPHATE IVPB (in mmol) 30 mmol (06/17/21 0836)     Objective: Vitals:   06/17/21 0806 06/17/21 0815 06/17/21 1149 06/17/21 1212   BP: (!) 101/52     Pulse: 91 (!) 46 (!) 129   Resp: (!) 23 (!) 22 (!) 28   Temp:    98.2 F (36.8 C)  TempSrc:    Oral  SpO2: 100% 97% 100%   Weight:        Intake/Output Summary (Last 24 hours) at 06/17/2021 1233 Last data filed at 06/17/2021 2297 Gross per 24 hour  Intake 1459.51 ml  Output 402 ml  Net 1057.51 ml   Filed Weights   06/13/21 2233  Weight: 102.5 kg    Examination:  General: A/O x4, negative acute respiratory distress Eyes: negative scleral hemorrhage, negative anisocoria, negative icterus ENT: Negative Runny nose, negative gingival bleeding, Neck:  Negative scars, masses, torticollis, lymphadenopathy, JVD Lungs: decreased breath sounds bilaterally (secondary to body habitus difficult to auscultate) without wheezes or crackles Cardiovascular: Regular rate and rhythm without murmur gallop or rub normal S1 and S2 Abdomen: Morbidly OBESE, negative abdominal pain, nondistended, positive soft, bowel sounds, no rebound, no ascites, no appreciable mass Extremities: No significant cyanosis, clubbing, or edema bilateral lower extremities Skin: Negative rashes, lesions, ulcers Psychiatric:  Negative depression, negative anxiety, negative fatigue, negative mania  Central nervous system:  Cranial nerves II through XII intact, tongue/uvula midline, all extremities muscle strength 5/5, sensation intact throughout, positive dysarthria, negative expressive aphasia, negative receptive aphasia.positive pill-rolling tremor bilateral hands  .     Data Reviewed: Care during the described time interval was provided by me .  I have reviewed this patient's available data, including medical history, events of note, physical examination, and all test results as part of my evaluation.  CBC: Recent Labs  Lab 06/13/21 0856 06/14/21 0313 06/15/21 0300 06/16/21 0250 06/17/21 0308  WBC 11.0* 9.7 10.9* 12.8* 15.1*  NEUTROABS 9.0*  --  9.7* 11.0* 13.0*  HGB 10.4* 9.7* 9.1* 8.5* 9.0*   HCT 37.6 35.0* 32.1* 29.8* 30.8*  MCV 107.1* 107.0* 104.6* 101.4* 101.0*  PLT 575* 496* 465* 467*  633*   Basic Metabolic Panel: Recent Labs  Lab 06/13/21 0856 06/14/21 0313 06/15/21 0300 06/16/21 0250 06/17/21 0308  NA 147* 146* 150* 149* 145  K 3.6 3.3* 3.7 3.6 3.2*  CL 103 106 111 109 104  CO2 32 31 33* 33* 34*  GLUCOSE 71 276* 154* 207* 179*  BUN 39* 36* 31* 24* 20  CREATININE 1.88* 1.60* 1.41* 1.26* 1.07*  CALCIUM 9.1 9.1 9.6 9.1 8.9  MG  --   --  2.0 1.8 2.0  PHOS  --   --  1.6* 2.2* 2.0*   GFR: CrCl cannot be calculated (Unknown ideal weight.). Liver Function Tests: Recent Labs  Lab 06/13/21 0856 06/14/21 0313 06/15/21 0300 06/16/21 0250 06/17/21 0308  AST 12* 9* 10* 12* 14*  ALT '8 8 10 10 11  '$ ALKPHOS 61 50 43 43 38  BILITOT 0.9 0.6 0.4 0.5 0.5  PROT 6.0* 5.5* 5.7* 5.5* 5.6*  ALBUMIN 2.3* 2.2* 2.6* 2.4* 2.7*   Recent Labs  Lab 06/13/21 0856  LIPASE 36   Recent Labs  Lab 06/13/21 0856  AMMONIA 36*   Coagulation Profile: No results for input(s): "INR", "PROTIME" in the last 168 hours. Cardiac Enzymes: No results for input(s): "CKTOTAL", "CKMB", "CKMBINDEX", "TROPONINI" in the last 168 hours. BNP (last 3 results) No results for input(s): "PROBNP" in the last 8760 hours. HbA1C: No results for input(s): "HGBA1C" in the last 72 hours.  CBG: Recent Labs  Lab 06/16/21 1958 06/17/21 0033 06/17/21 0307 06/17/21 0735 06/17/21 1130  GLUCAP 191* 139* 175* 209* 187*   Lipid Profile: No results for input(s): "CHOL", "HDL", "LDLCALC", "TRIG", "CHOLHDL", "LDLDIRECT" in the last 72 hours. Thyroid Function Tests: No results for input(s): "TSH", "T4TOTAL", "FREET4", "T3FREE", "THYROIDAB" in the last 72 hours. Anemia Panel: No results for input(s): "VITAMINB12", "FOLATE", "FERRITIN", "TIBC", "IRON", "RETICCTPCT" in the last 72 hours. Sepsis Labs: Recent Labs  Lab 06/13/21 0907 06/13/21 1230  PROCALCITON  --  0.53  LATICACIDVEN 0.7  --     Recent  Results (from the past 240 hour(s))  SARS Coronavirus 2 by RT PCR (hospital order, performed in [facility] hospital lab) *cepheid single result test* Anterior Nasal Swab     Status: None   Collection Time: 06/13/21  9:59 AM   Specimen: Anterior Nasal Swab  Result Value Ref Range Status   SARS Coronavirus 2 by RT PCR NEGATIVE NEGATIVE Final    Comment: (NOTE) SARS-CoV-2 target nucleic acids are NOT DETECTED.  The SARS-CoV-2 RNA is generally detectable in upper and lower respiratory specimens during the acute phase of infection. The lowest concentration of SARS-CoV-2 viral copies this assay can detect is 250 copies / mL. A negative result does not preclude SARS-CoV-2 infection and should not be used as the sole basis for treatment or other patient management decisions.  A negative result may occur with improper specimen collection / handling, submission of specimen other than nasopharyngeal swab, presence of viral mutation(s) within the areas targeted by this assay, and inadequate number of viral copies (<250 copies / mL). A negative result must be combined with clinical observations, patient history, and epidemiological information.  Fact Sheet for Patients:   https://www.patel.info/  Fact Sheet for Healthcare Providers: https://hall.com/  This test is not yet approved or  cleared by the [place] FDA and has been authorized for detection and/or diagnosis of SARS-CoV-2 by FDA under an Emergency Use Authorization (EUA).  This EUA will remain in effect (meaning this test can be used) for the  duration of the COVID-19 declaration under Section 564(b)(1) of the Act, 21 U.S.C. section 360bbb-3(b)(1), unless the authorization is terminated or revoked sooner.  Performed at [facility], [place] [address], [place], [place] [address]   Culture, blood (routine x 2)     Status: None (Preliminary result)   Collection Time:  06/13/21 12:30 PM   Specimen: BLOOD RIGHT HAND  Result Value Ref Range Status   Specimen Description   Final    BLOOD RIGHT HAND Performed at [facility] [address], [place], [place] [address]    Special Requests   Final    BOTTLES DRAWN AEROBIC AND ANAEROBIC Blood Culture results may not be optimal due to an inadequate volume of blood received in culture bottles Performed at [place] [address], [place], [place] [address]    Culture   Final    NO GROWTH 4 DAYS Performed at [facility], [place] [address], [place], [place] [address]    Report Status PENDING  Incomplete  Culture, blood (routine x 2)     Status: None (Preliminary result)   Collection Time: 06/13/21 12:30 PM   Specimen: BLOOD RIGHT HAND  Result Value Ref Range Status   Specimen Description   Final    BLOOD RIGHT HAND Performed at [facility] [address], [place], [place] [address]    Special Requests   Final    BOTTLES DRAWN AEROBIC AND ANAEROBIC Blood Culture adequate volume Performed at [place] [address] [place], [place] [address]    Culture   Final    NO GROWTH 4 DAYS Performed at [facility], [place] [address], [place], [place] [address]    Report Status PENDING  Incomplete  MRSA Next Gen by PCR, Nasal     Status: None   Collection Time: 06/14/21  1:31 AM   Specimen: Nasal Mucosa; Nasal Swab  Result Value Ref Range Status   MRSA by PCR Next Gen NOT DETECTED NOT DETECTED Final    Comment: (NOTE) The GeneXpert MRSA Assay (FDA approved for NASAL specimens only), is one component of a comprehensive MRSA colonization surveillance program. It is not intended to diagnose MRSA infection nor to guide or monitor treatment for MRSA infections. Test performance is not FDA approved in patients less than 33 years old. Performed at [facility], [place] [address] [place],   [address]   Respiratory (~20 pathogens) panel by PCR     Status: None   Collection Time: 06/14/21  7:46 AM   Specimen: Nasopharyngeal Swab; Respiratory  Result Value Ref Range Status   Adenovirus NOT DETECTED NOT DETECTED Final   Coronavirus 229E NOT DETECTED NOT DETECTED Final    Comment: (NOTE) The Coronavirus on the Respiratory Panel, DOES NOT test for the novel  Coronavirus (2019 nCoV)    Coronavirus HKU1 NOT DETECTED NOT DETECTED Final   Coronavirus NL63 NOT DETECTED NOT DETECTED Final   Coronavirus OC43 NOT DETECTED NOT DETECTED Final   Metapneumovirus NOT DETECTED NOT DETECTED Final   Rhinovirus / Enterovirus NOT DETECTED NOT DETECTED Final   Influenza A NOT DETECTED NOT DETECTED Final   Influenza B NOT DETECTED NOT DETECTED Final   Parainfluenza Virus 1 NOT DETECTED NOT DETECTED Final   Parainfluenza Virus 2 NOT DETECTED NOT DETECTED Final   Parainfluenza Virus 3 NOT DETECTED NOT DETECTED Final   Parainfluenza Virus 4 NOT DETECTED NOT DETECTED Final  Respiratory Syncytial Virus NOT DETECTED NOT DETECTED Final   Bordetella pertussis NOT DETECTED NOT DETECTED Final   Bordetella Parapertussis NOT DETECTED NOT DETECTED Final   Chlamydophila pneumoniae NOT DETECTED NOT DETECTED Final   Mycoplasma pneumoniae NOT DETECTED NOT DETECTED Final    Comment: Performed at [facility], [place] [address], [place], [place] [address]  C Difficile Quick Screen (NO PCR Reflex)     Status: None   Collection Time: 06/16/21  6:52 PM   Specimen: Stool  Result Value Ref Range Status   C Diff antigen NEGATIVE NEGATIVE Final   C Diff toxin NEGATIVE NEGATIVE Final   C Diff interpretation No C. difficile detected.  Final    Comment: Performed at [facility], [place] [address], [place], [place] [address]         Radiology Studies: No results found.      Scheduled Meds:  Chlorhexidine Gluconate Cloth  6 each Topical Daily   clopidogrel  75 mg Oral Q breakfast    donepezil  10 mg Oral QHS   FLUoxetine  40 mg Oral Daily   gabapentin  300 mg Oral BID   guaiFENesin  600 mg Oral BID   heparin injection (subcutaneous)  5,000 Units Subcutaneous Q8H   hydrocortisone sod succinate (SOLU-CORTEF) inj  100 mg Intravenous BID   insulin aspart  0-15 Units Subcutaneous Q4H   insulin detemir  14 Units Subcutaneous Daily   ipratropium-albuterol  3 mL Nebulization QID   latanoprost  1 drop Left Eye QHS   levETIRAcetam  250 mg Oral Daily   mouth rinse  15 mL Mouth Rinse BID   pravastatin  10 mg Oral q1800   timolol  1 drop Left Eye Daily   Continuous Infusions:  sodium chloride     sodium chloride     azithromycin Stopped (06/16/21 1035)   cefTRIAXone (ROCEPHIN)  IV Stopped (06/16/21 0900)   dextrose Stopped (06/17/21 0051)   fluconazole (DIFLUCAN) IV Stopped (06/16/21 1034)   potassium PHOSPHATE IVPB (in mmol) 30 mmol (06/17/21 0836)     LOS: 4 days    Time spent:50 min    Tiffany Hartman, Geraldo Docker, MD [facility]   If 7PM-7AM, please contact night-coverage 06/17/2021, 12:33 PM

## 2021-06-18 DIAGNOSIS — N179 Acute kidney failure, unspecified: Secondary | ICD-10-CM | POA: Diagnosis not present

## 2021-06-18 DIAGNOSIS — J9621 Acute and chronic respiratory failure with hypoxia: Secondary | ICD-10-CM | POA: Diagnosis not present

## 2021-06-18 DIAGNOSIS — J189 Pneumonia, unspecified organism: Secondary | ICD-10-CM | POA: Diagnosis not present

## 2021-06-18 DIAGNOSIS — R531 Weakness: Secondary | ICD-10-CM | POA: Diagnosis not present

## 2021-06-18 LAB — GASTROINTESTINAL PANEL BY PCR, STOOL (REPLACES STOOL CULTURE)

## 2021-06-18 LAB — CBC WITH DIFFERENTIAL/PLATELET
Abs Immature Granulocytes: 0.44 10*3/uL — ABNORMAL HIGH (ref 0.00–0.07)
Basophils Absolute: 0 10*3/uL (ref 0.0–0.1)
Basophils Relative: 0 %
Eosinophils Absolute: 0 10*3/uL (ref 0.0–0.5)
Eosinophils Relative: 0 %
HCT: 32.2 % — ABNORMAL LOW (ref 36.0–46.0)
Hemoglobin: 9.6 g/dL — ABNORMAL LOW (ref 12.0–15.0)
Immature Granulocytes: 3 %
Lymphocytes Relative: 8 %
Lymphs Abs: 1.3 10*3/uL (ref 0.7–4.0)
MCH: 29.8 pg (ref 26.0–34.0)
MCHC: 29.8 g/dL — ABNORMAL LOW (ref 30.0–36.0)
MCV: 100 fL (ref 80.0–100.0)
Monocytes Absolute: 0.9 10*3/uL (ref 0.1–1.0)
Monocytes Relative: 6 %
Neutro Abs: 14.3 10*3/uL — ABNORMAL HIGH (ref 1.7–7.7)
Neutrophils Relative %: 83 %
Platelets: 400 10*3/uL (ref 150–400)
RBC: 3.22 MIL/uL — ABNORMAL LOW (ref 3.87–5.11)
RDW: 13.3 % (ref 11.5–15.5)
WBC: 17 10*3/uL — ABNORMAL HIGH (ref 4.0–10.5)
nRBC: 0.4 % — ABNORMAL HIGH (ref 0.0–0.2)

## 2021-06-18 LAB — COMPREHENSIVE METABOLIC PANEL
ALT: 14 U/L (ref 0–44)
AST: 17 U/L (ref 15–41)
Albumin: 2.8 g/dL — ABNORMAL LOW (ref 3.5–5.0)
Alkaline Phosphatase: 44 U/L (ref 38–126)
Anion gap: 6 (ref 5–15)
BUN: 18 mg/dL (ref 8–23)
CO2: 32 mmol/L (ref 22–32)
Calcium: 9.2 mg/dL (ref 8.9–10.3)
Chloride: 107 mmol/L (ref 98–111)
Creatinine, Ser: 1.07 mg/dL — ABNORMAL HIGH (ref 0.44–1.00)
GFR, Estimated: 49 mL/min — ABNORMAL LOW (ref >60)
Glucose, Bld: 130 mg/dL — ABNORMAL HIGH (ref 70–99)
Potassium: 3.6 mmol/L (ref 3.5–5.1)
Sodium: 145 mmol/L (ref 135–145)
Total Bilirubin: 0.4 mg/dL (ref 0.3–1.2)
Total Protein: 5.8 g/dL — ABNORMAL LOW (ref 6.5–8.1)

## 2021-06-18 LAB — GLUCOSE, CAPILLARY
Glucose-Capillary: 135 mg/dL — ABNORMAL HIGH (ref 70–99)
Glucose-Capillary: 126 mg/dL — ABNORMAL HIGH (ref 70–99)
Glucose-Capillary: 165 mg/dL — ABNORMAL HIGH (ref 70–99)
Glucose-Capillary: 161 mg/dL — ABNORMAL HIGH (ref 70–99)
Glucose-Capillary: 159 mg/dL — ABNORMAL HIGH (ref 70–99)
Glucose-Capillary: 169 mg/dL — ABNORMAL HIGH (ref 70–99)

## 2021-06-18 LAB — CULTURE, BLOOD (ROUTINE X 2)
Culture: NO GROWTH
Culture: NO GROWTH
Special Requests: ADEQUATE

## 2021-06-18 LAB — LEGIONELLA PNEUMOPHILA SEROGP 1 UR AG: L. pneumophila Serogp 1 Ur Ag: NEGATIVE

## 2021-06-18 LAB — LIPID PANEL
Cholesterol: 142 mg/dL (ref 0–200)
HDL: 53 mg/dL (ref >40)
LDL Cholesterol: 65 mg/dL (ref 0–99)
Total CHOL/HDL Ratio: 2.7 RATIO
Triglycerides: 120 mg/dL (ref <150)
VLDL: 24 mg/dL (ref 0–40)

## 2021-06-18 LAB — PHOSPHORUS: Phosphorus: 3.2 mg/dL (ref 2.5–4.6)

## 2021-06-18 LAB — MAGNESIUM: Magnesium: 1.7 mg/dL (ref 1.7–2.4)

## 2021-06-18 MED ORDER — DM-GUAIFENESIN ER 30-600 MG PO TB12
1.0000 | ORAL_TABLET | Freq: Two times a day (BID) | ORAL | Status: DC
  Administered 2021-06-18 – 2021-06-21 (×7): 1 via ORAL
  Filled 2021-06-18 (×7): qty 1

## 2021-06-18 MED ORDER — ALPRAZOLAM 0.25 MG PO TABS
0.2500 mg | ORAL_TABLET | Freq: Three times a day (TID) | ORAL | Status: DC | PRN
  Administered 2021-06-18 – 2021-06-21 (×6): 0.25 mg via ORAL
  Filled 2021-06-18 (×6): qty 1

## 2021-06-18 MED ORDER — FLUOXETINE HCL 20 MG PO CAPS: 20.0000 mg | ORAL_CAPSULE | Freq: Every day | ORAL | Status: DC

## 2021-06-18 MED ORDER — HYDROCORTISONE SOD SUC (PF) 100 MG IJ SOLR
100.0000 mg | Freq: Every day | INTRAMUSCULAR | Status: AC
  Administered 2021-06-18 – 2021-06-19 (×2): 100 mg via INTRAVENOUS
  Filled 2021-06-18 (×2): qty 2

## 2021-06-18 NOTE — Progress Notes (Signed)
PROGRESS NOTE    Tiffany Hartman  PRF:[id number] DOB: [birth year] DOA: 06/13/2021 PCP: Carrolyn Meiers, MD     Brief Narrative:  88 y.o. BF PMHx  HTN, HLD, DM2, MGUS, CKD3b, chronic hypoxic respiratory failure on 2L [place], CVA.  Parkinson's disease, dementia  Presenting with weakness, lethargy. History from granddaughter at bedside. She reports that the patient has not eaten much at all for the last couple of weeks. She has become immobile during that time. She is not as interactive as her normal self. She has been very somnolent during this time. Her granddaughter also reports cough and congestion over the last week. The patient has not had any therapy for it. She has not have any fevers, N/V/D. She has not had any other complaints. When her condition did not improve this morning, her family decided to call EMS.    Subjective: 6/13 afebrile overnight A/O x4   Assessment & Plan: Covid vaccination;   Principal Problem:   Multifocal pneumonia Active Problems:   Diabetes ([place])   COPD (chronic obstructive pulmonary disease) ([place])   GERD without esophagitis   Dyslipidemia   Depression with anxiety   Acute on chronic respiratory failure with hypoxia (HCC)   Stage 3b chronic kidney disease (CKD) (HCC)   MGUS (monoclonal gammopathy of unknown significance)   Glaucoma   Generalized weakness   Failure to thrive in adult   HTN (hypertension), benign   Oropharyngeal candidiasis   Black hairy tongue   Septic shock (HCC)   Acute diarrhea   Altered mental status  Septic shock - On admission<36 C, RR> 20, hypotensive not responsive to fluid resuscitation, -See pneumonia  Multifocal PNA/Right mainstem obstruction/CAP -6/9 patient with very labile BP place arterial line - 6/9 Solu-Cortef '100mg'$  TID (stress dose) - 6/9 continue antibiotics x7 days - DuoNeb QID -BiPAP as required -Titrate O2 to maintain SPO2 89 to 95% - Incentive spirometry - Flutter valve -6/12 decrease Solu-Cortef  100 mg BID -6/12 ambulate patient in hallway q shift - Out of bed to chair q shift -6/13 decrease Solu-Cortef 100 mg daily   COPD  -See pneumonia  Acute on Chronic Hypoxic Respiratory Failure -See pneumonia  Oropharyngeal candidiasis - Diflucan IV 200 mg x 1 then 100 mg x 14 days  Leukocytosis -Patient on steroids  Black Hairy tongue syndrome -No treatment required.   Failure to Thrive/generalized weakness/lethargy  - dietitian consult  - PT/OT consult  Acute diarrhea - 6/11 stool culture negative - 6/11 C. Difficile negative   HTN/Hypotensive/Septic Shock -6/9 hold all BP medication -6/9 currently hypotensive -6/9 Albumin 50 g x 1 - 6/9 awaiting placement of arterial line, if BP actually true after fluid resuscitation and albumin we will start pressors -6/12 septic shock -6/13 resolved  Altered mental status - Minimize sedating medication benzodiazepine, narcotics. - 6/12 resolved   DM type II controlled with hyperglycemia - 6/9 hemoglobin A1c= 5.6 CBG (last 3)  Recent Labs    06/17/21 2312 06/18/21 0346 06/18/21 0754  GLUCAP 136* 135* 126*   -6/11 increase Levemir 14 units daily - 6/12 increase moderate SSI        AKI on CKD stage 3b (baseline Cr 1.3--1.4) Lab Results  Component Value Date   CREATININE 1.07 (H) 06/18/2021   CREATININE 1.07 (H) 06/17/2021   CREATININE 1.26 (H) 06/16/2021   CREATININE 1.41 (H) 06/15/2021   CREATININE 1.60 (H) 06/14/2021  -Hold all nephrotoxins - CT: No obstruction -6/11 WNL       MGUS     -  continue outpt follow up   HLD -Pravastatin 10 mg daily - Lipid panel pending   Anxiety/depression     - continue home regimen  Parkinson disease - Patient has not seen Parkinson's neurologist in 4 to 5 years however does have signs and symptoms consistent with Parkinson's. - 6/12 now that patient's cognition has improved consult neurology for evaluation for Parkinson's and recommendation on starting medication -6/12  spoke with Dr. Theda Sers Neurology who will evaluate patient, if unable to evaluate patient stated he will ensure she has a outpatient appointment with movement disorder specialist.  Will await recommendations. -6/13 upon discharge schedule establish care appointment with Dr. Wells Guiles Tat at [facility] outpatient neurology is a movent disorder specialist   Glaucoma     - continue home regimen  Hypokalemia - Potassium goal> 4 - 6/12 see hypophosphatemia  Hypophosphatemia - Phosphorus goal> 2.5 -6/12 K-Phos 30 mmol  Hypernatremia - D5W 45m/hr  Obesity BMI 42.7 kg/m. -  Dysphagia - 6/10 requesting speech evaluate; Dx high risk for aspiration   Goals of care - 6/12 PT/OT consult; morbidly obese patient with pneumonia, deconditioned would benefit from physical therapy.  Evaluate for CIR vs SNF      Mobility Assessment (last 72 hours)     Mobility Assessment     Row Name 06/17/21 1426 06/16/21 0800         Does patient have an order for bedrest or is patient medically unstable -- Yes- Bedfast (Level 1) - Complete      What is the highest level of mobility based on the progressive mobility assessment? Level 1 (Bedfast) - Unable to balance while sitting on edge of bed Level 1 (Bedfast) - Unable to balance while sitting on edge of bed                 Interdisciplinary Goals of Care Family Meeting   Date carried out: 06/18/2021  Location of the meeting:   Member's involved:   Durable Power of ATour manager     Discussion: We discussed goals of care for [name].    Code status:   Disposition:   Time spent for the meeting:     Yuki Brunsman J, MD  06/18/2021, 9:03 AM         DVT prophylaxis: Subcu heparin Code Status: DNR Family Communication: 6/10 granddaughter at bedside for discussion of plan of care all questions answered Status is: Inpatient    Dispo: The patient is from: Home              Anticipated d/c is  to: Home              Anticipated d/c date is: > 3 days              Patient currently is not medically stable to d/c.      Consultants:  PCCM  Procedures/Significant Events:    I have personally reviewed and interpreted all radiology studies and my findings are as above.  VENTILATOR SETTINGS: Nasal cannula 6/12 Flow 3 L/min SPO2 97%    Cultures 6/8 strep pneumo urine antigen pending 6/8 Legionella urine antigen pending 6/8 SARS coronavirus negative 6/8 influenza A/B negative 6/9 sputum pending 6/9 respiratory virus panel negative 6/11 stool culture negative 6/11 C. Difficile Antigen/C. difficile toxin negative   Antimicrobials: Anti-infectives (From admission, onward)    Start     Ordered Stop   06/15/21 1000  fluconazole (DIFLUCAN) IVPB 100 mg  06/14/21 1010     06/14/21 1100  fluconazole (DIFLUCAN) IVPB 200 mg        06/14/21 1010 06/14/21 1309   06/14/21 1000  cefTRIAXone (ROCEPHIN) 2 g in sodium chloride 0.9 % 100 mL IVPB        06/13/21 2010 06/19/21 0959   06/14/21 1000  azithromycin (ZITHROMAX) 500 mg in sodium chloride 0.9 % 250 mL IVPB        06/13/21 2010 06/19/21 0959   06/13/21 1215  cefTRIAXone (ROCEPHIN) 1 g in sodium chloride 0.9 % 100 mL IVPB        06/13/21 1211 06/13/21 1618   06/13/21 1215  azithromycin (ZITHROMAX) 500 mg in sodium chloride 0.9 % 250 mL IVPB        06/13/21 1211 06/13/21 1618         Devices    LINES / TUBES:      Continuous Infusions:  sodium chloride     sodium chloride Stopped (06/17/21 1458)   azithromycin Stopped (06/17/21 1431)   cefTRIAXone (ROCEPHIN)  IV Stopped (06/17/21 1327)   dextrose 75 mL/hr at 06/18/21 0700   fluconazole (DIFLUCAN) IV Stopped (06/17/21 1558)     Objective: Vitals:   06/18/21 0733 06/18/21 0754 06/18/21 0805 06/18/21 0819  BP:    (!) 159/46  Pulse: (!) 59   85  Resp: 16   (!) 25  Temp:   98.9 F (37.2 C)   TempSrc:   Axillary   SpO2: 91% 100%  99%  Weight:       Height:        Intake/Output Summary (Last 24 hours) at 06/18/2021 1610 Last data filed at 06/18/2021 0700 Gross per 24 hour  Intake 2398.07 ml  Output 900 ml  Net 1498.07 ml    Filed Weights   06/13/21 2233  Weight: 102.5 kg    Examination:  General: A/O x4, negative acute respiratory distress Eyes: negative scleral hemorrhage, negative anisocoria, negative icterus ENT: Negative Runny nose, negative gingival bleeding, Neck:  Negative scars, masses, torticollis, lymphadenopathy, JVD Lungs: decreased breath sounds bilaterally (secondary to body habitus difficult to auscultate) without wheezes or crackles Cardiovascular: Regular rate and rhythm without murmur gallop or rub normal S1 and S2 Abdomen: Morbidly OBESE, negative abdominal pain, nondistended, positive soft, bowel sounds, no rebound, no ascites, no appreciable mass Extremities: No significant cyanosis, clubbing, or edema bilateral lower extremities Skin: Negative rashes, lesions, ulcers Psychiatric:  Negative depression, negative anxiety, negative fatigue, negative mania  Central nervous system:  Cranial nerves II through XII intact, tongue/uvula midline, all extremities muscle strength 5/5, sensation intact throughout, positive dysarthria, negative expressive aphasia, negative receptive aphasia.positive pill-rolling tremor bilateral hands  .     Data Reviewed: Care during the described time interval was provided by me .  I have reviewed this patient's available data, including medical history, events of note, physical examination, and all test results as part of my evaluation.  CBC: Recent Labs  Lab 06/13/21 0856 06/14/21 0313 06/15/21 0300 06/16/21 0250 06/17/21 0308 06/18/21 0310  WBC 11.0* 9.7 10.9* 12.8* 15.1* 17.0*  NEUTROABS 9.0*  --  9.7* 11.0* 13.0* 14.3*  HGB 10.4* 9.7* 9.1* 8.5* 9.0* 9.6*  HCT 37.6 35.0* 32.1* 29.8* 30.8* 32.2*  MCV 107.1* 107.0* 104.6* 101.4* 101.0* 100.0  PLT 575* 496* 465* 467*  429* 960    Basic Metabolic Panel: Recent Labs  Lab 06/14/21 0313 06/15/21 0300 06/16/21 0250 06/17/21 0308 06/18/21 0310  NA 146* 150* 149* 145 145  K 3.3* 3.7 3.6 3.2* 3.6  CL 106 111 109 104 107  CO2 31 33* 33* 34* 32  GLUCOSE 276* 154* 207* 179* 130*  BUN 36* 31* 24* 20 18  CREATININE 1.60* 1.41* 1.26* 1.07* 1.07*  CALCIUM 9.1 9.6 9.1 8.9 9.2  MG  --  2.0 1.8 2.0 1.7  PHOS  --  1.6* 2.2* 2.0* 3.2    GFR: Estimated Creatinine Clearance: 36.9 mL/min (A) (by C-G formula based on SCr of 1.07 mg/dL (H)). Liver Function Tests: Recent Labs  Lab 06/14/21 0313 06/15/21 0300 06/16/21 0250 06/17/21 0308 06/18/21 0310  AST 9* 10* 12* 14* 17  ALT '8 10 10 11 14  '$ ALKPHOS 50 43 43 38 44  BILITOT 0.6 0.4 0.5 0.5 0.4  PROT 5.5* 5.7* 5.5* 5.6* 5.8*  ALBUMIN 2.2* 2.6* 2.4* 2.7* 2.8*    Recent Labs  Lab 06/13/21 0856  LIPASE 36    Recent Labs  Lab 06/13/21 0856  AMMONIA 36*    Coagulation Profile: No results for input(s): "INR", "PROTIME" in the last 168 hours. Cardiac Enzymes: No results for input(s): "CKTOTAL", "CKMB", "CKMBINDEX", "TROPONINI" in the last 168 hours. BNP (last 3 results) No results for input(s): "PROBNP" in the last 8760 hours. HbA1C: No results for input(s): "HGBA1C" in the last 72 hours.  CBG: Recent Labs  Lab 06/17/21 1611 06/17/21 1948 06/17/21 2312 06/18/21 0346 06/18/21 0754  GLUCAP 194* 182* 136* 135* 126*    Lipid Profile: Recent Labs    06/18/21 0310  CHOL 142  HDL 53  LDLCALC 65  TRIG 120  CHOLHDL 2.7   Thyroid Function Tests: No results for input(s): "TSH", "T4TOTAL", "FREET4", "T3FREE", "THYROIDAB" in the last 72 hours. Anemia Panel: No results for input(s): "VITAMINB12", "FOLATE", "FERRITIN", "TIBC", "IRON", "RETICCTPCT" in the last 72 hours. Sepsis Labs: Recent Labs  Lab 06/13/21 0907 06/13/21 1230  PROCALCITON  --  0.53  LATICACIDVEN 0.7  --      Recent Results (from the past 240 hour(s))  SARS  Coronavirus 2 by RT PCR (hospital order, performed in [facility] hospital lab) *cepheid single result test* Anterior Nasal Swab     Status: None   Collection Time: 06/13/21  9:59 AM   Specimen: Anterior Nasal Swab  Result Value Ref Range Status   SARS Coronavirus 2 by RT PCR NEGATIVE NEGATIVE Final    Comment: (NOTE) SARS-CoV-2 target nucleic acids are NOT DETECTED.  The SARS-CoV-2 RNA is generally detectable in upper and lower respiratory specimens during the acute phase of infection. The lowest concentration of SARS-CoV-2 viral copies this assay can detect is 250 copies / mL. A negative result does not preclude SARS-CoV-2 infection and should not be used as the sole basis for treatment or other patient management decisions.  A negative result may occur with improper specimen collection / handling, submission of specimen other than nasopharyngeal swab, presence of viral mutation(s) within the areas targeted by this assay, and inadequate number of viral copies (<250 copies / mL). A negative result must be combined with clinical observations, patient history, and epidemiological information.  Fact Sheet for Patients:   https://www.patel.info/  Fact Sheet for Healthcare Providers: https://hall.com/  This test is not yet approved or  cleared by the [place] FDA and has been authorized for detection and/or diagnosis of SARS-CoV-2 by FDA under an Emergency Use Authorization (EUA).  This EUA will remain in effect (meaning this test can be used) for the duration of the COVID-19 declaration under Section 564(b)(1)  of the Act, 21 U.S.C. section 360bbb-3(b)(1), unless the authorization is terminated or revoked sooner.  Performed at [facility], [place] [address] [place], [place] [address]   Culture, blood (routine x 2)     Status: None (Preliminary result)   Collection Time: 06/13/21 12:30 PM   Specimen: BLOOD RIGHT HAND   Result Value Ref Range Status   Specimen Description   Final    BLOOD RIGHT HAND Performed at [place] [address], [place], [place] [address]    Special Requests   Final    BOTTLES DRAWN AEROBIC AND ANAEROBIC Blood Culture results may not be optimal due to an inadequate volume of blood received in culture bottles Performed at [place] [address] [place], [place] [address]    Culture   Final    NO GROWTH 4 DAYS Performed at [facility], [place] [address], [place], [place] [address]    Report Status PENDING  Incomplete  Culture, blood (routine x 2)     Status: None (Preliminary result)   Collection Time: 06/13/21 12:30 PM   Specimen: BLOOD RIGHT HAND  Result Value Ref Range Status   Specimen Description   Final    BLOOD RIGHT HAND Performed at [place] [address], [place], [place] [address]    Special Requests   Final    BOTTLES DRAWN AEROBIC AND ANAEROBIC Blood Culture adequate volume Performed at [facility] [address], [place], [place] [address]    Culture   Final    NO GROWTH 4 DAYS Performed at [facility], [place] [address] [place], [place] [address]    Report Status PENDING  Incomplete  MRSA Next Gen by PCR, Nasal     Status: None   Collection Time: 06/14/21  1:31 AM   Specimen: Nasal Mucosa; Nasal Swab  Result Value Ref Range Status   MRSA by PCR Next Gen NOT DETECTED NOT DETECTED Final    Comment: (NOTE) The GeneXpert MRSA Assay (FDA approved for NASAL specimens only), is one component of a comprehensive MRSA colonization surveillance program. It is not intended to diagnose MRSA infection nor to guide or monitor treatment for MRSA infections. Test performance is not FDA approved in patients less than 70 years old. Performed at [facility], [place] [address], [place],  [address]   Respiratory (~20 pathogens) panel by PCR      Status: None   Collection Time: 06/14/21  7:46 AM   Specimen: Nasopharyngeal Swab; Respiratory  Result Value Ref Range Status   Adenovirus NOT DETECTED NOT DETECTED Final   Coronavirus 229E NOT DETECTED NOT DETECTED Final    Comment: (NOTE) The Coronavirus on the Respiratory Panel, DOES NOT test for the novel  Coronavirus (2019 nCoV)    Coronavirus HKU1 NOT DETECTED NOT DETECTED Final   Coronavirus NL63 NOT DETECTED NOT DETECTED Final   Coronavirus OC43 NOT DETECTED NOT DETECTED Final   Metapneumovirus NOT DETECTED NOT DETECTED Final   Rhinovirus / Enterovirus NOT DETECTED NOT DETECTED Final   Influenza A NOT DETECTED NOT DETECTED Final   Influenza B NOT DETECTED NOT DETECTED Final   Parainfluenza Virus 1 NOT DETECTED NOT DETECTED Final   Parainfluenza Virus 2 NOT DETECTED NOT DETECTED Final   Parainfluenza Virus 3 NOT DETECTED NOT DETECTED Final   Parainfluenza Virus 4 NOT DETECTED NOT DETECTED Final   Respiratory Syncytial Virus NOT DETECTED NOT DETECTED Final  Bordetella pertussis NOT DETECTED NOT DETECTED Final   Bordetella Parapertussis NOT DETECTED NOT DETECTED Final   Chlamydophila pneumoniae NOT DETECTED NOT DETECTED Final   Mycoplasma pneumoniae NOT DETECTED NOT DETECTED Final    Comment: Performed at [facility], [address] [place], [place] [address]  C Difficile Quick Screen (NO PCR Reflex)     Status: None   Collection Time: 06/16/21  6:52 PM   Specimen: Stool  Result Value Ref Range Status   C Diff antigen NEGATIVE NEGATIVE Final   C Diff toxin NEGATIVE NEGATIVE Final   C Diff interpretation No C. difficile detected.  Final    Comment: Performed at [facility], [place] [address] [place],  [address]         Radiology Studies: No results found.      Scheduled Meds:  Chlorhexidine Gluconate Cloth  6 each Topical Daily   clopidogrel  75 mg Oral Q breakfast   donepezil  10 mg Oral QHS   FLUoxetine  40 mg Oral Daily    gabapentin  300 mg Oral BID   guaiFENesin  600 mg Oral BID   heparin injection (subcutaneous)  5,000 Units Subcutaneous Q8H   hydrocortisone sod succinate (SOLU-CORTEF) inj  100 mg Intravenous BID   insulin aspart  0-15 Units Subcutaneous Q4H   insulin detemir  14 Units Subcutaneous Daily   ipratropium-albuterol  3 mL Nebulization QID   latanoprost  1 drop Left Eye QHS   levETIRAcetam  250 mg Oral Daily   mouth rinse  15 mL Mouth Rinse BID   pravastatin  10 mg Oral q1800   timolol  1 drop Left Eye Daily   Continuous Infusions:  sodium chloride     sodium chloride Stopped (06/17/21 1458)   azithromycin Stopped (06/17/21 1431)   cefTRIAXone (ROCEPHIN)  IV Stopped (06/17/21 1327)   dextrose 75 mL/hr at 06/18/21 0700   fluconazole (DIFLUCAN) IV Stopped (06/17/21 1558)     LOS: 5 days    Time spent:50 min    [place] Rios, Geraldo Docker, MD [facility]   If 7PM-7AM, please contact night-coverage 06/18/2021, 9:03 AM

## 2021-06-18 NOTE — Consult Note (Signed)
[facility] Inpatient Consult   06/18/2021  LADREA HOLLADAY July 26, 1927 [id number]  [facility] CM)   Patient chart has been reviewed with noted high risk score for unplanned readmissions.  Patient assessed for community [place] Management follow up needs. Per review, current recommendation is for SNF. No [facility] CM needs.   Of note, [facility] Care Management services does not replace or interfere with any services that are arranged by inpatient case management or social work.    Netta Cedars, MSN, RN [place] [facility] Liaison Toll free office [phone]

## 2021-06-18 NOTE — TOC Progression Note (Signed)
Transition of Care [facility]) - Progression Note    Patient Details  Name: Tiffany Hartman MRN: [id number] Date of Birth: 02/09/1927  Transition of Care [facility]) CM/SW Contact  Leeroy Cha, RN Phone Number: 06/18/2021, 7:27 AM  Clinical Narrative:    Following for snf placement when ready.   Expected Discharge Plan: [place] Barriers to Discharge: Continued Medical Work up  Expected Discharge Plan and Services Expected Discharge Plan: [place] In-house Referral: Clinical Social Work   Post Acute Care Choice: [place] Living arrangements for the past 2 months: Single Family Home                                       Social Determinants of Health (SDOH) Interventions    Readmission Risk Interventions    06/17/2021    1:05 PM  Readmission Risk Prevention Plan  Transportation Screening Complete  PCP or Specialist Appt within 3-5 Days Complete  HRI or [place] Complete  Social Work Consult for [place] Planning/Counseling Complete  Palliative Care Screening Not Applicable  Medication Review [employer]) Complete

## 2021-06-18 NOTE — Progress Notes (Signed)
Physical Therapy Treatment Patient Details Name: Tiffany Hartman MRN: [id number] DOB: 1927-09-07 Today's Date: 06/18/2021   History of Present Illness 88 y.o. BF PMHx  HTN, HLD, DM2, MGUS, CKD3b, chronic hypoxic respiratory failure on 2L [place], CVA.  Parkinson's disease, dementia     Presenting with weakness, lethargy. History from granddaughter at bedside. She reports that the patient has not eaten much at all for the last couple of weeks. She has become immobile during that time. She is not as interactive as her normal self. She has been very somnolent during this time. Her granddaughter also reports cough and congestion over the last week. The patient has not had any therapy for it. She has not have any fevers, N/V/D. She has not had any other complaints. When her condition did not improve this morning, her family decided to call EMS.    PT Comments    Pt seen in ICU room # 1234 General Comments: AxO x 3 pleasant Lady following all commands.  At times difficult speech. Asissted OOB was VERY difficult.  General bed mobility comments: pt required Total Assist + to partially sit EOB.  Present with MAX posterior lean/pushing fearful of falling and nearly sliding too much to EOB as pt was unable to plant B feet on floor.  Limited B knee flexion.  Pt unable to fully achieve upright sitting posture. Pt unable to forward flex at trunk due to ABD girth.   Pt stated she uses her Lift Chair "fully up" to be able to stand. General transfer comment: due to poor sitting ability, used MAXI Sky to asisst OOB to recliner.  Positioned upright to comfort.  Observerd B UE tremors.  Pt unable to hold a cup of Ginger Ale.   Pt will need ST Rehab at SNF prior to returning home with family.   Recommendations for follow up therapy are one component of a multi-disciplinary discharge planning process, led by the attending physician.  Recommendations may be updated based on patient status, additional functional criteria and insurance  authorization.  Follow Up Recommendations  Skilled nursing-short term rehab (<3 hours/day)     Assistance Recommended at Discharge Frequent or constant Supervision/Assistance  Patient can return home with the following Two people to help with walking and/or transfers;Assistance with cooking/housework;Assist for transportation;Direct supervision/assist for medications management;Help with stairs or ramp for entrance;Direct supervision/assist for financial management;Two people to help with bathing/dressing/bathroom   Equipment Recommendations  None recommended by PT    Recommendations for Other Services       Precautions / Restrictions Precautions Precautions: Fall Precaution Comments: LIFT CHAIR, Home Oxygen 2 lts, Restrictions Weight Bearing Restrictions: No     Mobility  Bed Mobility Overal bed mobility: Needs Assistance Bed Mobility: Supine to Sit     Supine to sit: +2 for physical assistance, Total assist     General bed mobility comments: pt required Total Assist + to partially sit EOB.  Present with MAX posterior lean/pushing fearful of falling and nearly sliding too much to EOB as pt was unable to plant B feet on floor.  Limited B knee flexion.  Pt unable to fully achieve upright sitting posture. Pt unable to forward flex at trunk due to ABD girth.   Pt stated she uses her Lift Chair "fully up" to be able to stand.    Transfers Overall transfer level: Needs assistance   Transfers: Bed to chair/wheelchair/BSC             General transfer comment: due to  poor sitting ability, used MAXI Sky to [employer] OOB to Pharmacologist Rankin (Stroke Patients Only)       Balance                                            Cognition Arousal/Alertness: Awake/alert Behavior During Therapy: WFL for tasks  assessed/performed Overall Cognitive Status: Within Functional Limits for tasks assessed                                 General Comments: AxO x 3 pleasant Lady following all commands.  At times difficult speech.        Exercises      General Comments        Pertinent Vitals/Pain Pain Assessment Pain Assessment: No/denies pain    Home Living                          Prior Function            PT Goals (current goals can now be found in the care plan section) Progress towards PT goals: Progressing toward goals    Frequency    Min 2X/week      PT Plan Current plan remains appropriate    Co-evaluation              AM-PAC PT "6 Clicks" Mobility   Outcome Measure  Help needed turning from your back to your side while in a flat bed without using bedrails?: Total Help needed moving from lying on your back to sitting on the side of a flat bed without using bedrails?: Total Help needed moving to and from a bed to a chair (including a wheelchair)?: Total Help needed standing up from a chair using your arms (e.g., wheelchair or bedside chair)?: Total Help needed to walk in hospital room?: Total Help needed climbing 3-5 steps with a railing? : Total 6 Click Score: 6    End of Session Equipment Utilized During Treatment: Gait belt;Oxygen Activity Tolerance: Patient limited by fatigue Patient left: in chair;with call bell/phone within reach Nurse Communication: Mobility status;Need for lift equipment PT Visit Diagnosis: Muscle weakness (generalized) (M62.81);Difficulty in walking, not elsewhere classified (R26.2);Other abnormalities of gait and mobility (R26.89);History of falling (Z91.81)     Time: 3817-7116 PT Time Calculation (min) (ACUTE ONLY): 27 min  Charges:  $Therapeutic Activity: 23-37 mins                     Rica Koyanagi  PTA Acute  Rehabilitation Services Pager      [phone] Office      [phone]

## 2021-06-19 DIAGNOSIS — J189 Pneumonia, unspecified organism: Secondary | ICD-10-CM | POA: Diagnosis not present

## 2021-06-19 LAB — CBC WITH DIFFERENTIAL/PLATELET
Abs Immature Granulocytes: 0.46 10*3/uL — ABNORMAL HIGH (ref 0.00–0.07)
Basophils Absolute: 0 10*3/uL (ref 0.0–0.1)
Basophils Relative: 0 %
Eosinophils Absolute: 0 10*3/uL (ref 0.0–0.5)
Eosinophils Relative: 0 %
HCT: 30.5 % — ABNORMAL LOW (ref 36.0–46.0)
Hemoglobin: 9.1 g/dL — ABNORMAL LOW (ref 12.0–15.0)
Immature Granulocytes: 3 %
Lymphocytes Relative: 9 %
Lymphs Abs: 1.5 10*3/uL (ref 0.7–4.0)
MCH: 29.4 pg (ref 26.0–34.0)
MCHC: 29.8 g/dL — ABNORMAL LOW (ref 30.0–36.0)
MCV: 98.7 fL (ref 80.0–100.0)
Monocytes Absolute: 1.1 10*3/uL — ABNORMAL HIGH (ref 0.1–1.0)
Monocytes Relative: 7 %
Neutro Abs: 13.3 10*3/uL — ABNORMAL HIGH (ref 1.7–7.7)
Neutrophils Relative %: 81 %
Platelets: 336 10*3/uL (ref 150–400)
RBC: 3.09 MIL/uL — ABNORMAL LOW (ref 3.87–5.11)
RDW: 13.4 % (ref 11.5–15.5)
WBC: 16.3 10*3/uL — ABNORMAL HIGH (ref 4.0–10.5)
nRBC: 0.5 % — ABNORMAL HIGH (ref 0.0–0.2)

## 2021-06-19 LAB — COMPREHENSIVE METABOLIC PANEL
ALT: 13 U/L (ref 0–44)
AST: 14 U/L — ABNORMAL LOW (ref 15–41)
Albumin: 2.4 g/dL — ABNORMAL LOW (ref 3.5–5.0)
Alkaline Phosphatase: 40 U/L (ref 38–126)
Anion gap: 6 (ref 5–15)
BUN: 16 mg/dL (ref 8–23)
CO2: 31 mmol/L (ref 22–32)
Calcium: 8.7 mg/dL — ABNORMAL LOW (ref 8.9–10.3)
Chloride: 103 mmol/L (ref 98–111)
Creatinine, Ser: 1.04 mg/dL — ABNORMAL HIGH (ref 0.44–1.00)
GFR, Estimated: 50 mL/min — ABNORMAL LOW (ref >60)
Glucose, Bld: 150 mg/dL — ABNORMAL HIGH (ref 70–99)
Potassium: 3.5 mmol/L (ref 3.5–5.1)
Sodium: 140 mmol/L (ref 135–145)
Total Bilirubin: 0.4 mg/dL (ref 0.3–1.2)
Total Protein: 5 g/dL — ABNORMAL LOW (ref 6.5–8.1)

## 2021-06-19 LAB — MAGNESIUM: Magnesium: 1.7 mg/dL (ref 1.7–2.4)

## 2021-06-19 LAB — GLUCOSE, CAPILLARY
Glucose-Capillary: 134 mg/dL — ABNORMAL HIGH (ref 70–99)
Glucose-Capillary: 133 mg/dL — ABNORMAL HIGH (ref 70–99)
Glucose-Capillary: 114 mg/dL — ABNORMAL HIGH (ref 70–99)
Glucose-Capillary: 142 mg/dL — ABNORMAL HIGH (ref 70–99)
Glucose-Capillary: 141 mg/dL — ABNORMAL HIGH (ref 70–99)

## 2021-06-19 LAB — PHOSPHORUS: Phosphorus: 3.1 mg/dL (ref 2.5–4.6)

## 2021-06-19 MED ORDER — IPRATROPIUM-ALBUTEROL 0.5-2.5 (3) MG/3ML IN SOLN
3.0000 mL | Freq: Three times a day (TID) | RESPIRATORY_TRACT | Status: DC
  Administered 2021-06-19 – 2021-06-20 (×4): 3 mL via RESPIRATORY_TRACT
  Filled 2021-06-19 (×4): qty 3

## 2021-06-19 MED ORDER — ORAL CARE MOUTH RINSE: 15.0000 mL | OROMUCOSAL | Status: DC | PRN

## 2021-06-19 MED ORDER — PREDNISONE 50 MG PO TABS
50.0000 mg | ORAL_TABLET | Freq: Every day | ORAL | Status: DC
  Administered 2021-06-20: 50 mg via ORAL
  Filled 2021-06-19: qty 1

## 2021-06-19 MED ORDER — FLUCONAZOLE 100 MG PO TABS
100.0000 mg | ORAL_TABLET | Freq: Every day | ORAL | Status: DC
Start: 2021-06-19 — End: 2021-06-21
  Administered 2021-06-20 – 2021-06-21 (×2): 100 mg via ORAL
  Filled 2021-06-19 (×2): qty 1

## 2021-06-19 MED ORDER — INSULIN ASPART 100 UNIT/ML IJ SOLN: 0.0000 [IU] | Freq: Three times a day (TID) | INTRAMUSCULAR | Status: DC

## 2021-06-19 MED ORDER — PANTOPRAZOLE SODIUM 40 MG PO TBEC
40.0000 mg | DELAYED_RELEASE_TABLET | Freq: Every day | ORAL | Status: DC
  Administered 2021-06-19 – 2021-06-21 (×3): 40 mg via ORAL
  Filled 2021-06-19 (×3): qty 1

## 2021-06-19 NOTE — Progress Notes (Signed)
Speech Language Pathology Treatment:    Patient Details Name: Tiffany Hartman MRN: [id number] DOB: 27-Jun-1927 Today's Date: 06/19/2021 Time: 6389-3734 SLP Time Calculation (min) (ACUTE ONLY): 35 min  Assessment / Plan / Recommendation Clinical Impression  Pt reports some issues with swallowing foods - most especially meats- has dentures that are not in place and she reports she normally wears them for intake.  She complained of dyspnea even while talking and she spoke for approx 20 minutes, thus did not provide her with po intake.  Pt has previously been seen by SLP for BSE in 2014 after she complained of dysphagia/choking - and she reports this improved.  She does endorse new issues with swallowing since this hospital admission.  Tongue with minimal black coating posterior oral cavity and pt denies odynophagia.   Pt requests diet change to chopped foods due to choking concerns.  Daughter to bring pt's dentures tomorrow.  Modified diet per pt request and will follow up for dysphagia management.  Given right UL collapse from right mainstem obstruction  and h/o dysphagia with CVA, ? if pt may be having some aspiration and/or reflux.  She denies any issues with reflux and is currently receiving a PPI.  Pt also has seen Dr Krista Blue in 2015 who documented pt wihtout parkinsonism symptoms and had received trial of Sinemet without motoric improvement.  Per Dr Eliseo Squires, pt to follow up with OP neurology, Dr Tat.  Will follow up next date for functional meal observation to assure pt tolerating po diet.  Was to conduct 3 ounce [place] but did not due to pt's dyspnea.  Using teach back, pt agreeable to plan and able to verbalize precautions.    HPI HPI: Tiffany Hartman comes from home d/t increasing generalized weakness and very poor intake. PMH includes HTN, HLD, DM2, MGUS, CKD3b, chronic hypoxic respiratory failure on 2L [place], CVA.  Parkinson's disease, dementia      SLP Plan  Continue with current plan of care       Recommendations for follow up therapy are one component of a multi-disciplinary discharge planning process, led by the attending physician.  Recommendations may be updated based on patient status, additional functional criteria and insurance authorization.    Recommendations  Diet recommendations: Dysphagia 2 (fine chop);Thin liquid Liquids provided via: Cup;Straw Medication Administration: Whole meds with liquid Supervision: Full supervision/cueing for compensatory strategies Compensations: Small sips/bites;Follow solids with liquid Postural Changes and/or Swallow Maneuvers: Seated upright 90 degrees;Upright 30-60 min after meal                Oral Care Recommendations: Oral care BID Follow Up Recommendations: No SLP follow up Assistance recommended at discharge: Frequent or constant Supervision/Assistance SLP Visit Diagnosis: Dysphagia, unspecified (R13.10) Plan: Continue with current plan of care         Kathleen Lime, MS [place] Office [phone] Pager [phone]   Macario Golds  06/19/2021, 5:43 PM

## 2021-06-19 NOTE — TOC Progression Note (Addendum)
Transition of Care [facility]) - Progression Note    Patient Details  Name: Tiffany Hartman MRN: [id number] Date of Birth: 1927/04/04  Transition of Care [facility]) CM/SW Contact  Artavia Jeanlouis, Juliann Pulse, RN Phone Number: 06/19/2021, 2:38 PM  Clinical Narrative: Spoke to dtr [place] out await bed offers.Prefers [employer].     Expected Discharge Plan: [place] Barriers to Discharge: Continued Medical Work up  Expected Discharge Plan and Services Expected Discharge Plan: [place] In-house Referral: Clinical Social Work   Post Acute Care Choice: [place] Living arrangements for the past 2 months: Single Family Home                                       Social Determinants of Health (SDOH) Interventions    Readmission Risk Interventions    06/17/2021    1:05 PM  Readmission Risk Prevention Plan  Transportation Screening Complete  PCP or Specialist Appt within 3-5 Days Complete  HRI or [place] Complete  Social Work Consult for [place] Planning/Counseling Complete  Palliative Care Screening Not Applicable  Medication Review [employer]) Complete

## 2021-06-19 NOTE — Progress Notes (Signed)
PROGRESS NOTE    Tiffany Hartman  VOH:[id number] DOB: 06/04/1927 DOA: 06/13/2021 PCP: Carrolyn Meiers, MD    Brief Narrative:  88 y.o. BF PMHx  HTN, HLD, DM2, MGUS, CKD3b, chronic hypoxic respiratory failure on 2L [place], CVA.  Parkinson's disease, mild dementia   Presenting with weakness, lethargy. History from granddaughter at bedside. She reports that the patient has not eaten much at all for the last couple of weeks. She has become immobile during that time. She is not as interactive as her normal self. She has been very somnolent during this time. Her granddaughter also reports cough and congestion over the last week. The patient has not had any therapy for it. She has not have any fevers, N/V/D. She has not had any other complaints. When her condition did not improve, her family decided to call EMS  Slowly improving, tx from SDU to floor, await SNF placement.  Assessment and Plan:  Septic shock - On admission<36 C, RR> 20, hypotensive not responsive to fluid resuscitation, -resolved   Multifocal PNA/Right mainstem obstruction/CAP -has been treated with abx -wean steroids to oral and then off -nebs -flutter valve   Acute on Chronic Hypoxic Respiratory Failure -wean off O2   Oropharyngeal candidiasis - Diflucan x 14 days   Leukocytosis -Patient on steroids   Failure to Thrive/generalized weakness/lethargy  - dietitian consult    Acute diarrhea - 6/11 stool culture negative - 6/11 C. Difficile negative -resolved   HTN/Hypotensive/Septic Shock -BP normal   Altered mental status - Minimize sedating medication benzodiazepine, narcotics. -resolved   DM type II controlled with hyperglycemia - 6/9 hemoglobin A1c= 5.6 SSI - d/c levemir as off D5 IVF        AKI on CKD stage 3b (baseline Cr 1.3--1.4) -improved       MGUS     - continue outpt follow up   HLD -Pravastatin 10 mg daily    Anxiety/depression     - continue home regimen   Parkinson disease -  Patient has not seen Parkinson's neurologist in 4 to 5 years however does have signs and symptoms consistent with Parkinson's. -6/13 upon discharge patient to establish care with Dr. Wells Guiles Tat at [facility] outpatient neurology is a movent disorder specialist   Glaucoma    - continue home regimen   Hypokalemia - replete   Hypophosphatemia - replete   Hypernatremia -resolved -d/c IVF   Obesity BMI 42.7 kg/m. Estimated body mass index is 42.7 kg/m as calculated from the following:   Height as of this encounter: '5\' 1"'$  (1.549 m).   Weight as of this encounter: 102.5 kg.    Dysphagia - speech eval- regular diet       DVT prophylaxis: heparin injection 5,000 Units Start: 06/14/21 2200 SCDs Start: 06/13/21 2011    Code Status: DNR Family Communication: at bedside  Disposition Plan:  Level of care: Telemetry Status is: Inpatient Remains inpatient appropriate because: needs SNF placement    Consultants:    Subjective: Feeling better, did cough a lot last night  Objective: Vitals:   06/19/21 0500 06/19/21 0746 06/19/21 0800 06/19/21 0813  BP: (!) 106/46  (!) 110/52   Pulse: 66 (!) 55 (!) 37   Resp: 16 (!) 24 (!) 23   Temp:    97.7 F (36.5 C)  TempSrc:    Axillary  SpO2: 100% 100% 100%   Weight:      Height:        Intake/Output Summary (Last 24 hours)  at 06/19/2021 1206 Last data filed at 06/19/2021 0800 Gross per 24 hour  Intake 1300.5 ml  Output 900 ml  Net 400.5 ml   Filed Weights   06/13/21 2233  Weight: 102.5 kg    Examination:   General: Appearance:    Severely obese female in no acute distress     Lungs:     respirations unlabored, diminished, no wheezing  Heart:    Bradycardic.    MS:   All extremities are intact.    Neurologic:   Awake, alert, pleasant and cooperative       Data Reviewed: I have personally reviewed following labs and imaging studies  CBC: Recent Labs  Lab 06/15/21 0300 06/16/21 0250 06/17/21 0308  06/18/21 0310 06/19/21 0331  WBC 10.9* 12.8* 15.1* 17.0* 16.3*  NEUTROABS 9.7* 11.0* 13.0* 14.3* 13.3*  HGB 9.1* 8.5* 9.0* 9.6* 9.1*  HCT 32.1* 29.8* 30.8* 32.2* 30.5*  MCV 104.6* 101.4* 101.0* 100.0 98.7  PLT 465* 467* 429* 400 662   Basic Metabolic Panel: Recent Labs  Lab 06/15/21 0300 06/16/21 0250 06/17/21 0308 06/18/21 0310 06/19/21 0331  NA 150* 149* 145 145 140  K 3.7 3.6 3.2* 3.6 3.5  CL 111 109 104 107 103  CO2 33* 33* 34* 32 31  GLUCOSE 154* 207* 179* 130* 150*  BUN 31* 24* '20 18 16  '$ CREATININE 1.41* 1.26* 1.07* 1.07* 1.04*  CALCIUM 9.6 9.1 8.9 9.2 8.7*  MG 2.0 1.8 2.0 1.7 1.7  PHOS 1.6* 2.2* 2.0* 3.2 3.1   GFR: Estimated Creatinine Clearance: 38 mL/min (A) (by C-G formula based on SCr of 1.04 mg/dL (H)). Liver Function Tests: Recent Labs  Lab 06/15/21 0300 06/16/21 0250 06/17/21 0308 06/18/21 0310 06/19/21 0331  AST 10* 12* 14* 17 14*  ALT '10 10 11 14 13  '$ ALKPHOS 43 43 38 44 40  BILITOT 0.4 0.5 0.5 0.4 0.4  PROT 5.7* 5.5* 5.6* 5.8* 5.0*  ALBUMIN 2.6* 2.4* 2.7* 2.8* 2.4*   Recent Labs  Lab 06/13/21 0856  LIPASE 36   Recent Labs  Lab 06/13/21 0856  AMMONIA 36*   Coagulation Profile: No results for input(s): "INR", "PROTIME" in the last 168 hours. Cardiac Enzymes: No results for input(s): "CKTOTAL", "CKMB", "CKMBINDEX", "TROPONINI" in the last 168 hours. BNP (last 3 results) No results for input(s): "PROBNP" in the last 8760 hours. HbA1C: No results for input(s): "HGBA1C" in the last 72 hours. CBG: Recent Labs  Lab 06/18/21 1950 06/18/21 2311 06/19/21 0332 06/19/21 0727 06/19/21 1130  GLUCAP 159* 169* 134* 133* 114*   Lipid Profile: Recent Labs    06/18/21 0310  CHOL 142  HDL 53  LDLCALC 65  TRIG 120  CHOLHDL 2.7   Thyroid Function Tests: No results for input(s): "TSH", "T4TOTAL", "FREET4", "T3FREE", "THYROIDAB" in the last 72 hours. Anemia Panel: No results for input(s): "VITAMINB12", "FOLATE", "FERRITIN", "TIBC", "IRON",  "RETICCTPCT" in the last 72 hours. Sepsis Labs: Recent Labs  Lab 06/13/21 0907 06/13/21 1230  PROCALCITON  --  0.53  LATICACIDVEN 0.7  --     Recent Results (from the past 240 hour(s))  SARS Coronavirus 2 by RT PCR (hospital order, performed in [facility] hospital lab) *cepheid single result test* Anterior Nasal Swab     Status: None   Collection Time: 06/13/21  9:59 AM   Specimen: Anterior Nasal Swab  Result Value Ref Range Status   SARS Coronavirus 2 by RT PCR NEGATIVE NEGATIVE Final    Comment: (NOTE) SARS-CoV-2 target nucleic  acids are NOT DETECTED.  The SARS-CoV-2 RNA is generally detectable in upper and lower respiratory specimens during the acute phase of infection. The lowest concentration of SARS-CoV-2 viral copies this assay can detect is 250 copies / mL. A negative result does not preclude SARS-CoV-2 infection and should not be used as the sole basis for treatment or other patient management decisions.  A negative result may occur with improper specimen collection / handling, submission of specimen other than nasopharyngeal swab, presence of viral mutation(s) within the areas targeted by this assay, and inadequate number of viral copies (<250 copies / mL). A negative result must be combined with clinical observations, patient history, and epidemiological information.  Fact Sheet for Patients:   https://www.patel.info/  Fact Sheet for Healthcare Providers: https://hall.com/  This test is not yet approved or  cleared by the [place] FDA and has been authorized for detection and/or diagnosis of SARS-CoV-2 by FDA under an Emergency Use Authorization (EUA).  This EUA will remain in effect (meaning this test can be used) for the duration of the COVID-19 declaration under Section 564(b)(1) of the Act, 21 U.S.C. section 360bbb-3(b)(1), unless the authorization is terminated or revoked sooner.  Performed at [facility], [place] [address] [place], [place] [address]   Culture, blood (routine x 2)     Status: None   Collection Time: 06/13/21 12:30 PM   Specimen: BLOOD RIGHT HAND  Result Value Ref Range Status   Specimen Description   Final    BLOOD RIGHT HAND Performed at [place] [address] [place], [place] [address]    Special Requests   Final    BOTTLES DRAWN AEROBIC AND ANAEROBIC Blood Culture results may not be optimal due to an inadequate volume of blood received in culture bottles Performed at [place] [address] [place], [place] [address]    Culture   Final    NO GROWTH 5 DAYS Performed at [facility], [place] [address], [place], [place] [address]    Report Status 06/18/2021 FINAL  Final  Culture, blood (routine x 2)     Status: None   Collection Time: 06/13/21 12:30 PM   Specimen: BLOOD RIGHT HAND  Result Value Ref Range Status   Specimen Description   Final    BLOOD RIGHT HAND Performed at [place] [address], [place], [place] [address]    Special Requests   Final    BOTTLES DRAWN AEROBIC AND ANAEROBIC Blood Culture adequate volume Performed at [place] [address], [place], [place] [address]    Culture   Final    NO GROWTH 5 DAYS Performed at [facility], [place] [address] [place], [place] [address]    Report Status 06/18/2021 FINAL  Final  MRSA Next Gen by PCR, Nasal     Status: None   Collection Time: 06/14/21  1:31 AM   Specimen: Nasal Mucosa; Nasal Swab  Result Value Ref Range Status   MRSA by PCR Next Gen NOT DETECTED NOT DETECTED Final    Comment: (NOTE) The GeneXpert MRSA Assay (FDA approved for NASAL specimens only), is one component of a comprehensive MRSA colonization surveillance program. It is not intended to diagnose MRSA infection nor to guide or monitor treatment for MRSA infections. Test performance is not FDA approved in  patients less than 3 years old. Performed at [facility], [place] [address] [place], [place] [address]  Respiratory (~20 pathogens) panel by PCR     Status: None   Collection Time: 06/14/21  7:46 AM   Specimen: Nasopharyngeal Swab; Respiratory  Result Value Ref Range Status   Adenovirus NOT DETECTED NOT DETECTED Final   Coronavirus 229E NOT DETECTED NOT DETECTED Final    Comment: (NOTE) The Coronavirus on the Respiratory Panel, DOES NOT test for the novel  Coronavirus (2019 nCoV)    Coronavirus HKU1 NOT DETECTED NOT DETECTED Final   Coronavirus NL63 NOT DETECTED NOT DETECTED Final   Coronavirus OC43 NOT DETECTED NOT DETECTED Final   Metapneumovirus NOT DETECTED NOT DETECTED Final   Rhinovirus / Enterovirus NOT DETECTED NOT DETECTED Final   Influenza A NOT DETECTED NOT DETECTED Final   Influenza B NOT DETECTED NOT DETECTED Final   Parainfluenza Virus 1 NOT DETECTED NOT DETECTED Final   Parainfluenza Virus 2 NOT DETECTED NOT DETECTED Final   Parainfluenza Virus 3 NOT DETECTED NOT DETECTED Final   Parainfluenza Virus 4 NOT DETECTED NOT DETECTED Final   Respiratory Syncytial Virus NOT DETECTED NOT DETECTED Final   Bordetella pertussis NOT DETECTED NOT DETECTED Final   Bordetella Parapertussis NOT DETECTED NOT DETECTED Final   Chlamydophila pneumoniae NOT DETECTED NOT DETECTED Final   Mycoplasma pneumoniae NOT DETECTED NOT DETECTED Final    Comment: Performed at [facility], [place] [address], [place], [place] [address]  Gastrointestinal Panel by PCR , Stool     Status: None   Collection Time: 06/16/21  6:52 PM   Specimen: Stool  Result Value Ref Range Status   Campylobacter species NOT DETECTED NOT DETECTED Final   Plesimonas shigelloides NOT DETECTED NOT DETECTED Final   Salmonella species NOT DETECTED NOT DETECTED Final   Yersinia enterocolitica NOT DETECTED NOT DETECTED Final   Vibrio species NOT DETECTED NOT DETECTED Final   Vibrio cholerae NOT DETECTED  NOT DETECTED Final   Enteroaggregative E coli (EAEC) NOT DETECTED NOT DETECTED Final   Enteropathogenic E coli (EPEC) NOT DETECTED NOT DETECTED Final   Enterotoxigenic E coli (ETEC) NOT DETECTED NOT DETECTED Final   Shiga like toxin producing E coli (STEC) NOT DETECTED NOT DETECTED Final   Shigella/Enteroinvasive E coli (EIEC) NOT DETECTED NOT DETECTED Final   Cryptosporidium NOT DETECTED NOT DETECTED Final   Cyclospora cayetanensis NOT DETECTED NOT DETECTED Final   Entamoeba histolytica NOT DETECTED NOT DETECTED Final   Giardia lamblia NOT DETECTED NOT DETECTED Final   Adenovirus F40/41 NOT DETECTED NOT DETECTED Final   Astrovirus NOT DETECTED NOT DETECTED Final   Norovirus GI/GII NOT DETECTED NOT DETECTED Final   Rotavirus A NOT DETECTED NOT DETECTED Final   Sapovirus (I, II, IV, and V) NOT DETECTED NOT DETECTED Final    Comment: Performed at [facility], [place] [place], [place] [address]  C Difficile Quick Screen (NO PCR Reflex)     Status: None   Collection Time: 06/16/21  6:52 PM   Specimen: Stool  Result Value Ref Range Status   C Diff antigen NEGATIVE NEGATIVE Final   C Diff toxin NEGATIVE NEGATIVE Final   C Diff interpretation No C. difficile detected.  Final    Comment: Performed at [facility], [place] [address], [place], [place] [address]         Radiology Studies: No results found.      Scheduled Meds:  Chlorhexidine Gluconate Cloth  6 each Topical Daily   clopidogrel  75 mg Oral Q breakfast   dextromethorphan-guaiFENesin  1 tablet  Oral BID   donepezil  10 mg Oral QHS   fluconazole  100 mg Oral Daily   FLUoxetine  40 mg Oral Daily   gabapentin  300 mg Oral BID   heparin injection (subcutaneous)  5,000 Units Subcutaneous Q8H   insulin aspart  0-6 Units Subcutaneous TID WC   insulin detemir  14 Units Subcutaneous Daily   ipratropium-albuterol  3 mL Nebulization TID   latanoprost  1 drop Left Eye QHS   levETIRAcetam  250  mg Oral Daily   mouth rinse  15 mL Mouth Rinse BID   pantoprazole  40 mg Oral Daily   pravastatin  10 mg Oral q1800   [START ON 06/20/2021] predniSONE  50 mg Oral Q breakfast   timolol  1 drop Left Eye Daily   Continuous Infusions:  sodium chloride     sodium chloride Stopped (06/17/21 1458)     LOS: 6 days    Time spent: 45 minutes spent on chart review, discussion with nursing staff, consultants, updating family and interview/physical exam; more than 50% of that time was spent in counseling and/or coordination of care.    Geradine Girt, DO [facility] Available via Epic secure chat 7am-7pm After these hours, please refer to coverage provider listed on [email or web address] 06/19/2021, 12:06 PM

## 2021-06-19 NOTE — Plan of Care (Signed)
  Problem: Education: Goal: Knowledge of disease or condition will improve Outcome: Progressing Goal: Knowledge of the prescribed therapeutic regimen will improve Outcome: Progressing Goal: Individualized Educational Video(s) Outcome: Progressing   Problem: Activity: Goal: Ability to tolerate increased activity will improve Outcome: Progressing Goal: Will verbalize the importance of balancing activity with adequate rest periods Outcome: Progressing   Problem: Respiratory: Goal: Ability to maintain a clear airway will improve Outcome: Progressing Goal: Levels of oxygenation will improve Outcome: Progressing Goal: Ability to maintain adequate ventilation will improve Outcome: Progressing   Problem: Activity: Goal: Ability to tolerate increased activity will improve Outcome: Progressing   Problem: Clinical Measurements: Goal: Ability to maintain a body temperature in the normal range will improve Outcome: Progressing   Problem: Respiratory: Goal: Ability to maintain adequate ventilation will improve Outcome: Progressing Goal: Ability to maintain a clear airway will improve Outcome: Progressing   Problem: Education: Goal: Knowledge of General Education information will improve Description: Including pain rating scale, medication(s)/side effects and non-pharmacologic comfort measures Outcome: Progressing   Problem: Health Behavior/Discharge Planning: Goal: Ability to manage health-related needs will improve Outcome: Progressing   Problem: Clinical Measurements: Goal: Ability to maintain clinical measurements within normal limits will improve Outcome: Progressing Goal: Will remain free from infection Outcome: Progressing Goal: Diagnostic test results will improve Outcome: Progressing Goal: Respiratory complications will improve Outcome: Progressing Goal: Cardiovascular complication will be avoided Outcome: Progressing   Problem: Activity: Goal: Risk for activity  intolerance will decrease Outcome: Progressing   Problem: Nutrition: Goal: Adequate nutrition will be maintained Outcome: Progressing   Problem: Coping: Goal: Level of anxiety will decrease Outcome: Progressing   Problem: Elimination: Goal: Will not experience complications related to bowel motility Outcome: Progressing Goal: Will not experience complications related to urinary retention Outcome: Progressing   Problem: Pain Managment: Goal: General experience of comfort will improve Outcome: Progressing   Problem: Safety: Goal: Ability to remain free from injury will improve Outcome: Progressing   Problem: Skin Integrity: Goal: Risk for impaired skin integrity will decrease Outcome: Progressing   Problem: Education: Goal: Ability to describe self-care measures that may prevent or decrease complications (Diabetes Survival Skills Education) will improve Outcome: Progressing Goal: Individualized Educational Video(s) Outcome: Progressing   Problem: Coping: Goal: Ability to adjust to condition or change in health will improve Outcome: Progressing   Problem: Fluid Volume: Goal: Ability to maintain a balanced intake and output will improve Outcome: Progressing   Problem: Health Behavior/Discharge Planning: Goal: Ability to identify and utilize available resources and services will improve Outcome: Progressing Goal: Ability to manage health-related needs will improve Outcome: Progressing   Problem: Metabolic: Goal: Ability to maintain appropriate glucose levels will improve Outcome: Progressing   Problem: Nutritional: Goal: Maintenance of adequate nutrition will improve Outcome: Progressing Goal: Progress toward achieving an optimal weight will improve Outcome: Progressing   Problem: Skin Integrity: Goal: Risk for impaired skin integrity will decrease Outcome: Progressing   Problem: Tissue Perfusion: Goal: Adequacy of tissue perfusion will improve Outcome:  Progressing   Problem: Education: Goal: Ability to describe self-care measures that may prevent or decrease complications (Diabetes Survival Skills Education) will improve Outcome: Progressing Goal: Individualized Educational Video(s) Outcome: Progressing

## 2021-06-19 NOTE — NC FL2 (Signed)
[place] LEVEL OF CARE SCREENING TOOL     IDENTIFICATION  Patient Name: Tiffany Hartman Birthdate: 1927-05-29 Sex: female Admission Date (Current Location): 06/13/2021  [facility] and [place] Number:  Herbalist and Address:  [facility],  [place], [place]      Provider Number: [id number]  Attending Physician Name and Address:  Geradine Girt, DO  Relative Name and Phone Number:  [name] Reitz(dtr) [phone]    Current Level of Care: Hospital Recommended Level of Care:   Prior Approval Number:    Date Approved/Denied:   PASRR Number: [id number]  Discharge Plan: SNF    Current Diagnoses: Patient Active Problem List   Diagnosis Date Noted   Altered mental status 06/17/2021   Acute diarrhea 06/16/2021   Oropharyngeal candidiasis 06/14/2021   Black hairy tongue 06/14/2021   Septic shock ([place]) 06/14/2021   Multifocal pneumonia 06/13/2021   Acute on chronic respiratory failure with hypoxia ([place]) 06/13/2021   Stage 3b chronic kidney disease (CKD) ([place]) 06/13/2021   MGUS (monoclonal gammopathy of unknown significance) 06/13/2021   Glaucoma 06/13/2021   Generalized weakness 06/13/2021   Failure to thrive in adult 06/13/2021   HTN (hypertension), benign 06/13/2021   GERD without esophagitis 03/01/2018   Dyslipidemia 03/01/2018   Chronic anemia 03/01/2018   Bilateral lower extremity edema 03/01/2018   Depression with anxiety 03/01/2018   COPD (chronic obstructive pulmonary disease) ([place]) 02/18/2018   Fracture of distal end of right femur ([place]) 02/12/18 02/13/2018   Asthma exacerbation 01/31/2015   Asthma with status asthmaticus 01/31/2015   Asthma    Chest pain 08/17/2014   Dyspnea 08/17/2014   Obesity 08/17/2014   Diabetes ([place]) 08/17/2014   Dizziness 12/27/2013   Stroke risk 12/27/2013   Stroke-like episode 12/27/2013   History of stroke 08/10/2012   Parkinsons disease ([place]) 08/10/2012   Left sided numbness  08/10/2012   Left hemiparesis ([place]) 08/10/2012   Dysarthria 08/10/2012   Seizure disorder ([place]) 08/10/2012   Other and unspecified hyperlipidemia 08/10/2012   Sciatica neuralgia 06/02/2012   DDD (degenerative disc disease), lumbar 06/02/2012   Hip pain 06/02/2012   Knee pain 06/02/2012   Pes anserinus bursitis 06/02/2012   SCIATICA 10/01/2009   SHOULDER, ARTHRITIS, DEGEN./OSTEO 11/27/2008   KNEE PAIN 07/26/2007   SHOULDER PAIN 12/01/2006   History of cardiovascular disorder 12/01/2006    Orientation RESPIRATION BLADDER Height & Weight     Self, Time, Situation, Place  O2 Incontinent Weight: 102.5 kg Height:  '5\' 1"'$  (154.9 cm)  BEHAVIORAL SYMPTOMS/MOOD NEUROLOGICAL BOWEL NUTRITION STATUS      Incontinent Diet (Regular)  AMBULATORY STATUS COMMUNICATION OF NEEDS Skin   Extensive Assist Verbally Normal                       Personal Care Assistance Level of Assistance  Bathing, Feeding, Dressing Bathing Assistance: Limited assistance Feeding assistance: Limited assistance Dressing Assistance: Limited assistance     Functional Limitations Info  Sight, Hearing, Speech Sight Info: Impaired (eyeglasses) Hearing Info: Adequate Speech Info: Impaired (Dentures top/bottom)    SPECIAL CARE FACTORS FREQUENCY  PT (By licensed PT), OT (By licensed OT)     PT Frequency:  (5x week) OT Frequency:  (5x week)            Contractures Contractures Info: Not present    Additional Factors Info  Code Status, Allergies Code Status Info:  (DNR) Allergies Info:  (Chloral Hydrate)  Current Medications (06/19/2021):  This is the current hospital active medication list Current Facility-Administered Medications  Medication Dose Route Frequency Provider Last Rate Last Admin   0.9 %  sodium chloride infusion   Intra-arterial PRN Allie Bossier, MD       0.9 %  sodium chloride infusion   Intravenous PRN Allie Bossier, MD   Paused at 06/17/21 1458   acetaminophen  (TYLENOL) tablet 650 mg  650 mg Oral Q6H PRN Marylyn Ishihara, Tyrone A, DO   650 mg at 06/19/21 0111   Or   acetaminophen (TYLENOL) suppository 650 mg  650 mg Rectal Q6H PRN Marylyn Ishihara, Tyrone A, DO       ALPRAZolam Duanne Moron) tablet 0.25 mg  0.25 mg Oral TID PRN Allie Bossier, MD   0.25 mg at 06/19/21 0948   calcium carbonate (TUMS - dosed in mg elemental calcium) chewable tablet 200 mg of elemental calcium  1 tablet Oral Daily PRN Marylyn Ishihara, Tyrone A, DO       Chlorhexidine Gluconate Cloth 2 % PADS 6 each  6 each Topical Daily Kathryne Eriksson, NP   6 each at 06/18/21 2228   clopidogrel (PLAVIX) tablet 75 mg  75 mg Oral Q breakfast Marylyn Ishihara, Tyrone A, DO   75 mg at 06/19/21 0807   dextromethorphan-guaiFENesin ([place] DM) 30-600 MG per 12 hr tablet 1 tablet  1 tablet Oral BID Allie Bossier, MD   1 tablet at 06/19/21 0948   donepezil (ARICEPT) tablet 10 mg  10 mg Oral QHS Kyle, Tyrone A, DO   10 mg at 06/18/21 2228   fluconazole (DIFLUCAN) tablet 100 mg  100 mg Oral Daily Vann, Jessica U, DO       FLUoxetine (PROZAC) capsule 40 mg  40 mg Oral Daily Kyle, Tyrone A, DO   40 mg at 06/19/21 0948   gabapentin (NEURONTIN) capsule 300 mg  300 mg Oral BID Marylyn Ishihara, Tyrone A, DO   300 mg at 06/19/21 0948   heparin injection 5,000 Units  5,000 Units Subcutaneous Q8H Suzzanne Cloud, RPH   5,000 Units at 06/19/21 0556   insulin aspart (novoLOG) injection 0-6 Units  0-6 Units Subcutaneous TID WC Vann, Jessica U, DO       ipratropium-albuterol (DUONEB) 0.5-2.5 (3) MG/3ML nebulizer solution 3 mL  3 mL Nebulization TID Allie Bossier, MD   3 mL at 06/19/21 1357   latanoprost (XALATAN) 0.005 % ophthalmic solution 1 drop  1 drop Left Eye QHS Kyle, Tyrone A, DO   1 drop at 06/18/21 2230   levETIRAcetam (KEPPRA) tablet 250 mg  250 mg Oral Daily Kyle, Tyrone A, DO   250 mg at 06/19/21 0949   lip balm (CARMEX) ointment   Topical PRN Allie Bossier, MD   1 application  at 63/84/66 0557   MEDLINE mouth rinse  15 mL Mouth Rinse BID Allie Bossier,  MD   15 mL at 06/19/21 0954   ondansetron (ZOFRAN) tablet 4 mg  4 mg Oral Q6H PRN Cherylann Ratel A, DO       Or   ondansetron (ZOFRAN) injection 4 mg  4 mg Intravenous Q6H PRN Marylyn Ishihara, Tyrone A, DO       pantoprazole (PROTONIX) EC tablet 40 mg  40 mg Oral Daily Vann, Jessica U, DO   40 mg at 06/19/21 1002   pravastatin (PRAVACHOL) tablet 10 mg  10 mg Oral q1800 Kyle, Tyrone A, DO   10 mg at 06/18/21 1749   [START  ON 06/20/2021] predniSONE (DELTASONE) tablet 50 mg  50 mg Oral Q breakfast Vann, Jessica U, DO       timolol (TIMOPTIC) 0.5 % ophthalmic solution 1 drop  1 drop Left Eye Daily Kyle, Tyrone A, DO   1 drop at 06/19/21 6244     Discharge Medications: Please see discharge summary for a list of discharge medications.  Relevant Imaging Results:  Relevant Lab Results:   Additional Information SS#[phone] Moderna x2,Booster x1  Daksha Koone, Juliann Pulse, RN

## 2021-06-19 NOTE — Progress Notes (Signed)
Occupational Therapy Treatment Patient Details Name: Tiffany Hartman MRN: [id number] DOB: 1927-09-11 Today's Date: 06/19/2021   History of present illness 88 y.o. BF PMHx  HTN, HLD, DM2, MGUS, CKD3b, chronic hypoxic respiratory failure on 2L [place], CVA.  Parkinson's disease, dementia     Presenting with weakness, lethargy. History from granddaughter at bedside. She reports that the patient has not eaten much at all for the last couple of weeks. She has become immobile during that time. She is not as interactive as her normal self. She has been very somnolent during this time. Her granddaughter also reports cough and congestion over the last week. The patient has not had any therapy for it. She has not have any fevers, N/V/D. She has not had any other complaints. When her condition did not improve this morning, her family decided to call EMS.   OT comments  Treatment focused on sitting balance and tolerance with attempts at standing. Initially attempted stedy but patient unable to tolerate shoulder ROM at 90 degrees holding on to stedy bar due to shoulder pain. Then planned on scooting to chair but patient began complaining of whooziness and dizziness and ultimately returned to supine. +2 physical assist for bed mobility and no actual attempt at sit to stand. Tolerate > than 10 minutes at edge of bed with min guard to min assist. BP elevated with sitting and once returned to bed. Rn informed. Cont POC.    Recommendations for follow up therapy are one component of a multi-disciplinary discharge planning process, led by the attending physician.  Recommendations may be updated based on patient status, additional functional criteria and insurance authorization.    Follow Up Recommendations  Skilled nursing-short term rehab (<3 hours/day)    Assistance Recommended at Discharge Frequent or constant Supervision/Assistance  Patient can return home with the following  Two people to help with walking and/or  transfers;Two people to help with bathing/dressing/bathroom;Assistance with cooking/housework;Assistance with feeding;Direct supervision/assist for medications management;Direct supervision/assist for financial management;Assist for transportation;Help with stairs or ramp for entrance   Equipment Recommendations  Other (comment) (defer to next venue)    Recommendations for Other Services      Precautions / Restrictions Precautions Precautions: Fall Precaution Comments: LIFT CHAIR, Home Oxygen 2 lts, Restrictions Weight Bearing Restrictions: No Other Position/Activity Restrictions: tremors BUE, bilateral shoulder ROM deficits       Mobility Bed Mobility Overal bed mobility: Needs Assistance Bed Mobility: Supine to Sit, Sit to Supine, Rolling Rolling: Max assist, +2 for physical assistance   Supine to sit: Max assist, +2 for physical assistance Sit to supine: Max assist, +2 for physical assistance   General bed mobility comments: Patient able to assist with transfers but still requires +2 physical assistance    Transfers Overall transfer level: Needs assistance                 General transfer comment: unable to stand. unable to tolerate shoulder position when holding on to stedy bar and unable to assist with sit to stand.     Balance Overall balance assessment: Needs assistance Sitting-balance support: Single extremity supported Sitting balance-Leahy Scale: Fair Sitting balance - Comments: able to briefly maintain sitting balance without upper extremity support but wants to prop on RUE                                   ADL either performed or assessed with clinical judgement  ADL                                              Extremity/Trunk Assessment Upper Extremity Assessment Upper Extremity Assessment:  (decreased shoulder ROM bilaterally, reports pain in shoulders at 90 degrees when reaching for stedy bar)       Cervical  / Trunk Assessment Cervical / Trunk Assessment: Normal    Vision Patient Visual Report: No change from baseline     Perception     Praxis      Cognition Arousal/Alertness: Awake/alert Behavior During Therapy: WFL for tasks assessed/performed Overall Cognitive Status: Within Functional Limits for tasks assessed                                          Exercises      Shoulder Instructions       General Comments      Pertinent Vitals/ Pain       Pain Assessment Pain Assessment: PAINAD Breathing: occasional labored breathing, short period of hyperventilation Negative Vocalization: occasional moan/groan, low speech, negative/disapproving quality Facial Expression: sad, frightened, frown Body Language: tense, distressed pacing, fidgeting Consolability: distracted or reassured by voice/touch PAINAD Score: 5 Pain Location: Bil shoulders Pain Descriptors / Indicators: Grimacing, Guarding Pain Intervention(s): Limited activity within patient's tolerance, Monitored during session  Home Living                                          Prior Functioning/Environment              Frequency  Min 2X/week        Progress Toward Goals  OT Goals(current goals can now be found in the care plan section)  Progress towards OT goals: Progressing toward goals  Acute Rehab OT Goals Patient Stated Goal: to stand and pivot OT Goal Formulation: With patient Time For Goal Achievement: 06/28/21 Potential to Achieve Goals: [place] Discharge plan remains appropriate    Co-evaluation          OT goals addressed during session:  (activity tolerance, functional mobility)      AM-PAC OT "6 Clicks" Daily Activity     Outcome Measure   Help from another person eating meals?: A Little Help from another person taking care of personal grooming?: A Little Help from another person toileting, which includes using toliet, bedpan, or urinal?:  Total Help from another person bathing (including washing, rinsing, drying)?: A Lot Help from another person to put on and taking off regular upper body clothing?: A Lot Help from another person to put on and taking off regular lower body clothing?: Total 6 Click Score: 12    End of Session Equipment Utilized During Treatment: Oxygen  OT Visit Diagnosis: Muscle weakness (generalized) (M62.81);Pain;Other symptoms and signs involving cognitive function;History of falling (Z91.81);Other abnormalities of gait and mobility (R26.89);Unsteadiness on feet (R26.81)   Activity Tolerance Patient tolerated treatment well;Other (comment) (complaining of dizziness at edge of bed)   Patient Left in bed;with call bell/phone within reach;with bed alarm set   Nurse Communication  (BP, complaints of dizziness)        Time: 4765-4650 OT Time Calculation (min):  32 min  Charges: OT General Charges $OT Visit: 1 Visit OT Treatments $Therapeutic Activity: 23-37 mins  Sol Englert, OTR/L [place]  Office [phone] Pager: [place] 06/19/2021, 1:14 PM

## 2021-06-20 DIAGNOSIS — J189 Pneumonia, unspecified organism: Secondary | ICD-10-CM | POA: Diagnosis not present

## 2021-06-20 LAB — COMPREHENSIVE METABOLIC PANEL
ALT: 11 U/L (ref 0–44)
AST: 16 U/L (ref 15–41)
Albumin: 2.4 g/dL — ABNORMAL LOW (ref 3.5–5.0)
Alkaline Phosphatase: 36 U/L — ABNORMAL LOW (ref 38–126)
Anion gap: 6 (ref 5–15)
BUN: 17 mg/dL (ref 8–23)
CO2: 32 mmol/L (ref 22–32)
Calcium: 8.4 mg/dL — ABNORMAL LOW (ref 8.9–10.3)
Chloride: 102 mmol/L (ref 98–111)
Creatinine, Ser: 1.23 mg/dL — ABNORMAL HIGH (ref 0.44–1.00)
GFR, Estimated: 41 mL/min — ABNORMAL LOW (ref >60)
Glucose, Bld: 102 mg/dL — ABNORMAL HIGH (ref 70–99)
Potassium: 4.1 mmol/L (ref 3.5–5.1)
Sodium: 140 mmol/L (ref 135–145)
Total Bilirubin: 0.5 mg/dL (ref 0.3–1.2)
Total Protein: 4.8 g/dL — ABNORMAL LOW (ref 6.5–8.1)

## 2021-06-20 LAB — CBC WITH DIFFERENTIAL/PLATELET
Abs Immature Granulocytes: 0.35 10*3/uL — ABNORMAL HIGH (ref 0.00–0.07)
Basophils Absolute: 0 10*3/uL (ref 0.0–0.1)
Basophils Relative: 0 %
Eosinophils Absolute: 0 10*3/uL (ref 0.0–0.5)
Eosinophils Relative: 0 %
HCT: 30.1 % — ABNORMAL LOW (ref 36.0–46.0)
Hemoglobin: 9.1 g/dL — ABNORMAL LOW (ref 12.0–15.0)
Immature Granulocytes: 2 %
Lymphocytes Relative: 12 %
Lymphs Abs: 1.8 10*3/uL (ref 0.7–4.0)
MCH: 29.7 pg (ref 26.0–34.0)
MCHC: 30.2 g/dL (ref 30.0–36.0)
MCV: 98.4 fL (ref 80.0–100.0)
Monocytes Absolute: 1 10*3/uL (ref 0.1–1.0)
Monocytes Relative: 7 %
Neutro Abs: 12 10*3/uL — ABNORMAL HIGH (ref 1.7–7.7)
Neutrophils Relative %: 79 %
Platelets: 274 10*3/uL (ref 150–400)
RBC: 3.06 MIL/uL — ABNORMAL LOW (ref 3.87–5.11)
RDW: 13.6 % (ref 11.5–15.5)
WBC: 15.2 10*3/uL — ABNORMAL HIGH (ref 4.0–10.5)
nRBC: 0.5 % — ABNORMAL HIGH (ref 0.0–0.2)

## 2021-06-20 LAB — GLUCOSE, CAPILLARY
Glucose-Capillary: 110 mg/dL — ABNORMAL HIGH (ref 70–99)
Glucose-Capillary: 85 mg/dL (ref 70–99)
Glucose-Capillary: 130 mg/dL — ABNORMAL HIGH (ref 70–99)
Glucose-Capillary: 147 mg/dL — ABNORMAL HIGH (ref 70–99)
Glucose-Capillary: 220 mg/dL — ABNORMAL HIGH (ref 70–99)

## 2021-06-20 LAB — MAGNESIUM: Magnesium: 1.5 mg/dL — ABNORMAL LOW (ref 1.7–2.4)

## 2021-06-20 LAB — PHOSPHORUS: Phosphorus: 3.2 mg/dL (ref 2.5–4.6)

## 2021-06-20 MED ORDER — ENSURE ENLIVE PO LIQD
237.0000 mL | Freq: Two times a day (BID) | ORAL | Status: DC
Start: 2021-06-20 — End: 2021-06-21
  Administered 2021-06-21: 237 mL via ORAL

## 2021-06-20 MED ORDER — IPRATROPIUM-ALBUTEROL 0.5-2.5 (3) MG/3ML IN SOLN
3.0000 mL | Freq: Two times a day (BID) | RESPIRATORY_TRACT | Status: DC
  Administered 2021-06-20 – 2021-06-21 (×2): 3 mL via RESPIRATORY_TRACT
  Filled 2021-06-20 (×2): qty 3

## 2021-06-20 MED ORDER — MECLIZINE HCL 25 MG PO TABS
12.5000 mg | ORAL_TABLET | Freq: Three times a day (TID) | ORAL | Status: DC | PRN
  Administered 2021-06-20: 12.5 mg via ORAL
  Filled 2021-06-20: qty 1

## 2021-06-20 MED ORDER — MAGNESIUM SULFATE 2 GM/50ML IV SOLN
2.0000 g | Freq: Once | INTRAVENOUS | Status: AC
  Administered 2021-06-20: 2 g via INTRAVENOUS
  Filled 2021-06-20: qty 50

## 2021-06-20 NOTE — Progress Notes (Addendum)
Nutrition Follow-up  INTERVENTION:   -Ensure Plus High Protein po BID, each supplement provides 350 kcal and 20 grams of protein.   NUTRITION DIAGNOSIS:   Inadequate oral intake related to acute illness, inability to eat as evidenced by per patient/family report, NPO status.  Now on dysphagia 2 diet.  GOAL:   Patient will meet greater than or equal to 90% of their needs  Progressing.  MONITOR:   Diet advancement, Labs, Weight trends, PO intakes  ASSESSMENT:   88 y.o. female with medical history of HTN, HLD, type 2 DM, MGUS, stage 3 CKD, chronic hypoxic respiratory failure on 2L [place], dementia, Parkinson's disease, osteoarthritis, diverticulitis, and CVA. She presented to the ED due to weakness and lethargy. History from granddaughter who reported that patient has not eaten much for the last couple of weeks. She has become immobile during that time. Her granddaughter also reported cough and congestion over the last week. She was admitted for multifocal pneumonia.  Patient currently consuming 20-80% of meals. Pt evaluated by SLP and recommended dysphagia 2 diet. Will add Ensure supplements as well for additional kcals and protein.  Admission weight: 225 lbs. No other weights this admission.  Medications: IV Mg sulfate  Labs reviewed: CBGs: 85-142 Low Mg  Diet Order:   Diet Order             DIET DYS 2 Room service appropriate? Yes; Fluid consistency: Thin  Diet effective now                   EDUCATION NEEDS:   No education needs have been identified at this time  Skin:  Skin Assessment: Reviewed RN Assessment  Last BM:  6/15 -type 2  Height:   Ht Readings from Last 1 Encounters:  06/17/21 '5\' 1"'$  (1.549 m)    Weight:   Wt Readings from Last 1 Encounters:  06/13/21 102.5 kg    Ideal Body Weight:  47.7 kg  BMI:  Body mass index is 42.7 kg/m.  Estimated Nutritional Needs:   Kcal:  1650-1850 kcal  Protein:  75-85 grams  Fluid:  >/= 1.7  L/day  Clayton Bibles, MS, RD, LDN Inpatient Clinical Dietitian Contact information available via Amion

## 2021-06-20 NOTE — Care Management Important Message (Signed)
Important Message  Patient Details IM Letter placed in Patients room. Name: Tiffany Hartman MRN: [id number] Date of Birth: 09-Jun-1927   Medicare Important Message Given:  Yes     Kerin Salen 06/20/2021, 10:22 AM

## 2021-06-20 NOTE — TOC Progression Note (Signed)
Transition of Care [facility]) - Progression Note    Patient Details  Name: Tiffany Hartman MRN: [id number] Date of Birth: 08-14-1927  Transition of Care [facility]) CM/SW Contact  Joyous Gleghorn, Marjie Skiff, RN Phone Number: 06/20/2021, 3:33 PM  Clinical Narrative:    Received call from [place] at [facility]. They are able to accept her to their SNF tomorrow if she is medically ready. Pt daughter in law works at [employer] and pt states that she wants to go there. TOC will continue to follow.   Expected Discharge Plan: [place] Barriers to Discharge: Continued Medical Work up  Expected Discharge Plan and Services Expected Discharge Plan: [place] In-house Referral: Clinical Social Work   Post Acute Care Choice: [place] Living arrangements for the past 2 months: Single Family Home                    Readmission Risk Interventions    06/17/2021    1:05 PM  Readmission Risk Prevention Plan  Transportation Screening Complete  PCP or Specialist Appt within 3-5 Days Complete  HRI or [place] Complete  Social Work Consult for [place] Planning/Counseling Complete  Palliative Care Screening Not Applicable  Medication Review Press photographer) Complete

## 2021-06-20 NOTE — Progress Notes (Addendum)
PROGRESS NOTE    Tiffany Hartman  WUJ:[id number] DOB: 02-13-1927 DOA: 06/13/2021 PCP: Carrolyn Meiers, MD    Brief Narrative:  88 y.o. BF PMHx  HTN, HLD, DM2, MGUS, CKD3b, chronic hypoxic respiratory failure on 2L [place], CVA.  Parkinson's disease, mild dementia   Presenting with weakness, lethargy. History from granddaughter at bedside. She reports that the patient has not eaten much at all for the last couple of weeks. She has become immobile during that time. She is not as interactive as her normal self. She has been very somnolent during this time. Her granddaughter also reports cough and congestion over the last week. The patient has not had any therapy for it. She has not have any fevers, N/V/D. She has not had any other complaints. When her condition did not improve, her family decided to call EMS  Slowly improving, tx from SDU to floor, await SNF placement.  Assessment and Plan:  Septic shock - On admission<36 C, RR> 20, hypotensive not responsive to fluid resuscitation, -resolved   Multifocal PNA/Right mainstem obstruction/CAP -has been treated with abx -wean steroids to oral and then off -nebs -flutter valve   Acute on Chronic Hypoxic Respiratory Failure -wean off O2   Oropharyngeal candidiasis - Diflucan x 14 days   Leukocytosis -Patient on steroids   Failure to Thrive/generalized weakness/lethargy  - dietitian consult    Acute diarrhea - 6/11 stool culture negative - 6/11 C. Difficile negative -resolved   HTN/Hypotensive/Septic Shock -BP normal   Altered mental status - Minimize sedating medication benzodiazepine, narcotics. -resolved   DM type II controlled with hyperglycemia - 6/9 hemoglobin A1c= 5.6 SSI - d/c levemir as off D5 IVF        AKI on CKD stage 3b (baseline Cr 1.3--1.4) -improved       MGUS     - continue outpt follow up   HLD -Pravastatin 10 mg daily    Anxiety/depression     - continue home regimen   Parkinson disease -  Patient has not seen Parkinson's neurologist in 4 to 5 years however does have signs and symptoms consistent with Parkinson's. -6/13 upon discharge patient to establish care with Dr. Wells Guiles Tat at [facility] outpatient neurology is a movent disorder specialist   Glaucoma    - continue home regimen   Hypokalemia/hypomagnesemia - replete   Hypophosphatemia - replete   Hypernatremia -resolved -d/c IVF   Obesity  Estimated body mass index is 42.7 kg/m as calculated from the following:   Height as of this encounter: '5\' 1"'$  (1.549 m).   Weight as of this encounter: 102.5 kg.    Dysphagia - speech eval- regular diet    Nutrition Status: Nutrition Problem: Inadequate oral intake Etiology: acute illness, inability to eat Signs/Symptoms: per patient/family report, NPO status Interventions: Refer to RD note for recommendations       DVT prophylaxis: heparin injection 5,000 Units Start: 06/14/21 2200 SCDs Start: 06/13/21 2011    Code Status: DNR Family Communication: at bedside  Disposition Plan:  Level of care: Telemetry Status is: Inpatient Remains inpatient appropriate because: needs SNF placement      Subjective: Headache this AM  Objective: Vitals:   06/20/21 0300 06/20/21 0400 06/20/21 0837 06/20/21 0922  BP:  (!) 105/39  (!) 108/44  Pulse:  (!) 54  78  Resp:  19  18  Temp: 98.8 F (37.1 C)   98.7 F (37.1 C)  TempSrc: Oral   Oral  SpO2:  100% 98% 93%  Weight:      Height:        Intake/Output Summary (Last 24 hours) at 06/20/2021 1136 Last data filed at 06/20/2021 3536 Gross per 24 hour  Intake 647.41 ml  Output 900 ml  Net -252.59 ml   Filed Weights   06/13/21 2233  Weight: 102.5 kg    Examination:    General: Appearance:    Severely obese female in no acute distress     Lungs:     diminished, respirations unlabored  Heart:    Normal heart rate. Normal rhythm. No murmurs, rubs, or gallops.   MS:   All extremities are intact.   Neurologic:    Awake, alert, oriented x 3. No apparent focal neurological           defect.          Data Reviewed: I have personally reviewed following labs and imaging studies  CBC: Recent Labs  Lab 06/16/21 0250 06/17/21 0308 06/18/21 0310 06/19/21 0331 06/20/21 0308  WBC 12.8* 15.1* 17.0* 16.3* 15.2*  NEUTROABS 11.0* 13.0* 14.3* 13.3* 12.0*  HGB 8.5* 9.0* 9.6* 9.1* 9.1*  HCT 29.8* 30.8* 32.2* 30.5* 30.1*  MCV 101.4* 101.0* 100.0 98.7 98.4  PLT 467* 429* 400 336 144   Basic Metabolic Panel: Recent Labs  Lab 06/16/21 0250 06/17/21 0308 06/18/21 0310 06/19/21 0331 06/20/21 0308  NA 149* 145 145 140 140  K 3.6 3.2* 3.6 3.5 4.1  CL 109 104 107 103 102  CO2 33* 34* 32 31 32  GLUCOSE 207* 179* 130* 150* 102*  BUN 24* '20 18 16 17  '$ CREATININE 1.26* 1.07* 1.07* 1.04* 1.23*  CALCIUM 9.1 8.9 9.2 8.7* 8.4*  MG 1.8 2.0 1.7 1.7 1.5*  PHOS 2.2* 2.0* 3.2 3.1 3.2   GFR: Estimated Creatinine Clearance: 32.1 mL/min (A) (by C-G formula based on SCr of 1.23 mg/dL (H)). Liver Function Tests: Recent Labs  Lab 06/16/21 0250 06/17/21 0308 06/18/21 0310 06/19/21 0331 06/20/21 0308  AST 12* 14* 17 14* 16  ALT '10 11 14 13 11  '$ ALKPHOS 43 38 44 40 36*  BILITOT 0.5 0.5 0.4 0.4 0.5  PROT 5.5* 5.6* 5.8* 5.0* 4.8*  ALBUMIN 2.4* 2.7* 2.8* 2.4* 2.4*   No results for input(s): "LIPASE", "AMYLASE" in the last 168 hours.  No results for input(s): "AMMONIA" in the last 168 hours.  Coagulation Profile: No results for input(s): "INR", "PROTIME" in the last 168 hours. Cardiac Enzymes: No results for input(s): "CKTOTAL", "CKMB", "CKMBINDEX", "TROPONINI" in the last 168 hours. BNP (last 3 results) No results for input(s): "PROBNP" in the last 8760 hours. HbA1C: No results for input(s): "HGBA1C" in the last 72 hours. CBG: Recent Labs  Lab 06/19/21 1130 06/19/21 1653 06/19/21 2123 06/20/21 0318 06/20/21 0813  GLUCAP 114* 142* 141* 110* 85   Lipid Profile: Recent Labs    06/18/21 0310   CHOL 142  HDL 53  LDLCALC 65  TRIG 120  CHOLHDL 2.7   Thyroid Function Tests: No results for input(s): "TSH", "T4TOTAL", "FREET4", "T3FREE", "THYROIDAB" in the last 72 hours. Anemia Panel: No results for input(s): "VITAMINB12", "FOLATE", "FERRITIN", "TIBC", "IRON", "RETICCTPCT" in the last 72 hours. Sepsis Labs: Recent Labs  Lab 06/13/21 1230  PROCALCITON 0.53    Recent Results (from the past 240 hour(s))  SARS Coronavirus 2 by RT PCR (hospital order, performed in [facility] hospital lab) *cepheid single result test* Anterior Nasal Swab     Status: None   Collection Time: 06/13/21  9:59 AM   Specimen: Anterior Nasal Swab  Result Value Ref Range Status   SARS Coronavirus 2 by RT PCR NEGATIVE NEGATIVE Final    Comment: (NOTE) SARS-CoV-2 target nucleic acids are NOT DETECTED.  The SARS-CoV-2 RNA is generally detectable in upper and lower respiratory specimens during the acute phase of infection. The lowest concentration of SARS-CoV-2 viral copies this assay can detect is 250 copies / mL. A negative result does not preclude SARS-CoV-2 infection and should not be used as the sole basis for treatment or other patient management decisions.  A negative result may occur with improper specimen collection / handling, submission of specimen other than nasopharyngeal swab, presence of viral mutation(s) within the areas targeted by this assay, and inadequate number of viral copies (<250 copies / mL). A negative result must be combined with clinical observations, patient history, and epidemiological information.  Fact Sheet for Patients:   https://www.patel.info/  Fact Sheet for Healthcare Providers: https://hall.com/  This test is not yet approved or  cleared by the [place] FDA and has been authorized for detection and/or diagnosis of SARS-CoV-2 by FDA under an Emergency Use Authorization (EUA).  This EUA will remain in effect  (meaning this test can be used) for the duration of the COVID-19 declaration under Section 564(b)(1) of the Act, 21 U.S.C. section 360bbb-3(b)(1), unless the authorization is terminated or revoked sooner.  Performed at [facility], [place] [address], [place], [place] [address]   Culture, blood (routine x 2)     Status: None   Collection Time: 06/13/21 12:30 PM   Specimen: BLOOD RIGHT HAND  Result Value Ref Range Status   Specimen Description   Final    BLOOD RIGHT HAND Performed at [facility] [address] [place], [place] [address]    Special Requests   Final    BOTTLES DRAWN AEROBIC AND ANAEROBIC Blood Culture results may not be optimal due to an inadequate volume of blood received in culture bottles Performed at [facility] [address] [place], [place] [address]    Culture   Final    NO GROWTH 5 DAYS Performed at [facility], [place] [address] [place], [place] [address]    Report Status 06/18/2021 FINAL  Final  Culture, blood (routine x 2)     Status: None   Collection Time: 06/13/21 12:30 PM   Specimen: BLOOD RIGHT HAND  Result Value Ref Range Status   Specimen Description   Final    BLOOD RIGHT HAND Performed at [facility] [address] [place], [place] [address]    Special Requests   Final    BOTTLES DRAWN AEROBIC AND ANAEROBIC Blood Culture adequate volume Performed at [facility] [address] [place], [place] [address]    Culture   Final    NO GROWTH 5 DAYS Performed at [facility], [place] [address], [place], [place] [address]    Report Status 06/18/2021 FINAL  Final  MRSA Next Gen by PCR, Nasal     Status: None   Collection Time: 06/14/21  1:31 AM   Specimen: Nasal Mucosa; Nasal Swab  Result Value Ref Range Status   MRSA by PCR Next Gen NOT DETECTED NOT DETECTED Final    Comment: (NOTE) The GeneXpert MRSA Assay (FDA approved for NASAL  specimens only), is one component of a comprehensive MRSA colonization surveillance program. It is not intended to diagnose MRSA infection nor to  guide or monitor treatment for MRSA infections. Test performance is not FDA approved in patients less than 64 years old. Performed at [facility], [place] [address] [place], [place] [address]   Respiratory (~20 pathogens) panel by PCR     Status: None   Collection Time: 06/14/21  7:46 AM   Specimen: Nasopharyngeal Swab; Respiratory  Result Value Ref Range Status   Adenovirus NOT DETECTED NOT DETECTED Final   Coronavirus 229E NOT DETECTED NOT DETECTED Final    Comment: (NOTE) The Coronavirus on the Respiratory Panel, DOES NOT test for the novel  Coronavirus (2019 nCoV)    Coronavirus HKU1 NOT DETECTED NOT DETECTED Final   Coronavirus NL63 NOT DETECTED NOT DETECTED Final   Coronavirus OC43 NOT DETECTED NOT DETECTED Final   Metapneumovirus NOT DETECTED NOT DETECTED Final   Rhinovirus / Enterovirus NOT DETECTED NOT DETECTED Final   Influenza A NOT DETECTED NOT DETECTED Final   Influenza B NOT DETECTED NOT DETECTED Final   Parainfluenza Virus 1 NOT DETECTED NOT DETECTED Final   Parainfluenza Virus 2 NOT DETECTED NOT DETECTED Final   Parainfluenza Virus 3 NOT DETECTED NOT DETECTED Final   Parainfluenza Virus 4 NOT DETECTED NOT DETECTED Final   Respiratory Syncytial Virus NOT DETECTED NOT DETECTED Final   Bordetella pertussis NOT DETECTED NOT DETECTED Final   Bordetella Parapertussis NOT DETECTED NOT DETECTED Final   Chlamydophila pneumoniae NOT DETECTED NOT DETECTED Final   Mycoplasma pneumoniae NOT DETECTED NOT DETECTED Final    Comment: Performed at [facility], [place], [place], [place] [address]  Gastrointestinal Panel by PCR , Stool     Status: None   Collection Time: 06/16/21  6:52 PM   Specimen: Stool  Result Value Ref Range Status   Campylobacter species NOT DETECTED NOT DETECTED Final   Plesimonas  shigelloides NOT DETECTED NOT DETECTED Final   Salmonella species NOT DETECTED NOT DETECTED Final   Yersinia enterocolitica NOT DETECTED NOT DETECTED Final   Vibrio species NOT DETECTED NOT DETECTED Final   Vibrio cholerae NOT DETECTED NOT DETECTED Final   Enteroaggregative E coli (EAEC) NOT DETECTED NOT DETECTED Final   Enteropathogenic E coli (EPEC) NOT DETECTED NOT DETECTED Final   Enterotoxigenic E coli (ETEC) NOT DETECTED NOT DETECTED Final   Shiga like toxin producing E coli (STEC) NOT DETECTED NOT DETECTED Final   Shigella/Enteroinvasive E coli (EIEC) NOT DETECTED NOT DETECTED Final   Cryptosporidium NOT DETECTED NOT DETECTED Final   Cyclospora cayetanensis NOT DETECTED NOT DETECTED Final   Entamoeba histolytica NOT DETECTED NOT DETECTED Final   Giardia lamblia NOT DETECTED NOT DETECTED Final   Adenovirus F40/41 NOT DETECTED NOT DETECTED Final   Astrovirus NOT DETECTED NOT DETECTED Final   Norovirus GI/GII NOT DETECTED NOT DETECTED Final   Rotavirus A NOT DETECTED NOT DETECTED Final   Sapovirus (I, II, IV, and V) NOT DETECTED NOT DETECTED Final    Comment: Performed at [facility], [address] [place], [place] [address]  C Difficile Quick Screen (NO PCR Reflex)     Status: None   Collection Time: 06/16/21  6:52 PM   Specimen: Stool  Result Value Ref Range Status   C Diff antigen NEGATIVE NEGATIVE Final   C Diff toxin NEGATIVE NEGATIVE Final   C Diff interpretation No C. difficile detected.  Final    Comment: Performed at [facility], [place] [address], [place], [place] [address]         Radiology  Studies: No results found.      Scheduled Meds:  Chlorhexidine Gluconate Cloth  6 each Topical Daily   clopidogrel  75 mg Oral Q breakfast   dextromethorphan-guaiFENesin  1 tablet Oral BID   donepezil  10 mg Oral QHS   fluconazole  100 mg Oral Daily   FLUoxetine  40 mg Oral Daily   gabapentin  300 mg Oral BID   heparin injection  (subcutaneous)  5,000 Units Subcutaneous Q8H   insulin aspart  0-6 Units Subcutaneous TID WC   ipratropium-albuterol  3 mL Nebulization BID   latanoprost  1 drop Left Eye QHS   levETIRAcetam  250 mg Oral Daily   mouth rinse  15 mL Mouth Rinse BID   pantoprazole  40 mg Oral Daily   pravastatin  10 mg Oral q1800   predniSONE  50 mg Oral Q breakfast   timolol  1 drop Left Eye Daily   Continuous Infusions:  sodium chloride     sodium chloride Stopped (06/17/21 1458)     LOS: 7 days    Time spent: 45 minutes spent on chart review, discussion with nursing staff, consultants, updating family and interview/physical exam; more than 50% of that time was spent in counseling and/or coordination of care.    Geradine Girt, DO [facility] Available via Epic secure chat 7am-7pm After these hours, please refer to coverage provider listed on [email or web address] 06/20/2021, 11:36 AM

## 2021-06-21 DIAGNOSIS — F0150 Vascular dementia without behavioral disturbance: Secondary | ICD-10-CM | POA: Diagnosis not present

## 2021-06-21 DIAGNOSIS — X500XXA Overexertion from strenuous movement or load, initial encounter: Secondary | ICD-10-CM | POA: Diagnosis not present

## 2021-06-21 DIAGNOSIS — E1122 Type 2 diabetes mellitus with diabetic chronic kidney disease: Secondary | ICD-10-CM | POA: Diagnosis not present

## 2021-06-21 DIAGNOSIS — F339 Major depressive disorder, recurrent, unspecified: Secondary | ICD-10-CM | POA: Diagnosis not present

## 2021-06-21 DIAGNOSIS — S9305XA Dislocation of left ankle joint, initial encounter: Secondary | ICD-10-CM | POA: Diagnosis not present

## 2021-06-21 DIAGNOSIS — M5136 Other intervertebral disc degeneration, lumbar region: Secondary | ICD-10-CM | POA: Diagnosis not present

## 2021-06-21 DIAGNOSIS — M7732 Calcaneal spur, left foot: Secondary | ICD-10-CM | POA: Diagnosis not present

## 2021-06-21 DIAGNOSIS — I1310 Hypertensive heart and chronic kidney disease without heart failure, with stage 1 through stage 4 chronic kidney disease, or unspecified chronic kidney disease: Secondary | ICD-10-CM | POA: Diagnosis not present

## 2021-06-21 DIAGNOSIS — S82832A Other fracture of upper and lower end of left fibula, initial encounter for closed fracture: Secondary | ICD-10-CM | POA: Diagnosis not present

## 2021-06-21 DIAGNOSIS — H409 Unspecified glaucoma: Secondary | ICD-10-CM | POA: Diagnosis not present

## 2021-06-21 DIAGNOSIS — M6281 Muscle weakness (generalized): Secondary | ICD-10-CM | POA: Diagnosis not present

## 2021-06-21 DIAGNOSIS — J189 Pneumonia, unspecified organism: Secondary | ICD-10-CM | POA: Diagnosis not present

## 2021-06-21 DIAGNOSIS — R1310 Dysphagia, unspecified: Secondary | ICD-10-CM | POA: Diagnosis not present

## 2021-06-21 DIAGNOSIS — M2550 Pain in unspecified joint: Secondary | ICD-10-CM | POA: Diagnosis not present

## 2021-06-21 DIAGNOSIS — S99912A Unspecified injury of left ankle, initial encounter: Secondary | ICD-10-CM | POA: Diagnosis present

## 2021-06-21 DIAGNOSIS — J431 Panlobular emphysema: Secondary | ICD-10-CM | POA: Diagnosis not present

## 2021-06-21 DIAGNOSIS — Z7901 Long term (current) use of anticoagulants: Secondary | ICD-10-CM | POA: Diagnosis not present

## 2021-06-21 DIAGNOSIS — R627 Adult failure to thrive: Secondary | ICD-10-CM | POA: Diagnosis not present

## 2021-06-21 DIAGNOSIS — K219 Gastro-esophageal reflux disease without esophagitis: Secondary | ICD-10-CM | POA: Diagnosis not present

## 2021-06-21 DIAGNOSIS — F0390 Unspecified dementia without behavioral disturbance: Secondary | ICD-10-CM | POA: Diagnosis not present

## 2021-06-21 DIAGNOSIS — B370 Candidal stomatitis: Secondary | ICD-10-CM | POA: Diagnosis not present

## 2021-06-21 DIAGNOSIS — R262 Difficulty in walking, not elsewhere classified: Secondary | ICD-10-CM | POA: Diagnosis not present

## 2021-06-21 DIAGNOSIS — I10 Essential (primary) hypertension: Secondary | ICD-10-CM | POA: Diagnosis not present

## 2021-06-21 DIAGNOSIS — N183 Chronic kidney disease, stage 3 unspecified: Secondary | ICD-10-CM | POA: Diagnosis not present

## 2021-06-21 DIAGNOSIS — R41841 Cognitive communication deficit: Secondary | ICD-10-CM | POA: Diagnosis not present

## 2021-06-21 DIAGNOSIS — R6521 Severe sepsis with septic shock: Secondary | ICD-10-CM | POA: Diagnosis not present

## 2021-06-21 DIAGNOSIS — J45909 Unspecified asthma, uncomplicated: Secondary | ICD-10-CM | POA: Diagnosis not present

## 2021-06-21 DIAGNOSIS — S82392A Other fracture of lower end of left tibia, initial encounter for closed fracture: Secondary | ICD-10-CM | POA: Diagnosis not present

## 2021-06-21 DIAGNOSIS — Z741 Need for assistance with personal care: Secondary | ICD-10-CM | POA: Diagnosis not present

## 2021-06-21 DIAGNOSIS — J9611 Chronic respiratory failure with hypoxia: Secondary | ICD-10-CM | POA: Diagnosis not present

## 2021-06-21 DIAGNOSIS — I152 Hypertension secondary to endocrine disorders: Secondary | ICD-10-CM | POA: Diagnosis not present

## 2021-06-21 DIAGNOSIS — Z7401 Bed confinement status: Secondary | ICD-10-CM | POA: Diagnosis not present

## 2021-06-21 DIAGNOSIS — E1169 Type 2 diabetes mellitus with other specified complication: Secondary | ICD-10-CM | POA: Diagnosis not present

## 2021-06-21 DIAGNOSIS — S82842A Displaced bimalleolar fracture of left lower leg, initial encounter for closed fracture: Secondary | ICD-10-CM | POA: Diagnosis not present

## 2021-06-21 DIAGNOSIS — N1832 Chronic kidney disease, stage 3b: Secondary | ICD-10-CM | POA: Diagnosis not present

## 2021-06-21 DIAGNOSIS — E785 Hyperlipidemia, unspecified: Secondary | ICD-10-CM | POA: Diagnosis not present

## 2021-06-21 DIAGNOSIS — I129 Hypertensive chronic kidney disease with stage 1 through stage 4 chronic kidney disease, or unspecified chronic kidney disease: Secondary | ICD-10-CM | POA: Diagnosis not present

## 2021-06-21 DIAGNOSIS — R278 Other lack of coordination: Secondary | ICD-10-CM | POA: Diagnosis not present

## 2021-06-21 DIAGNOSIS — S82892S Other fracture of left lower leg, sequela: Secondary | ICD-10-CM | POA: Diagnosis not present

## 2021-06-21 DIAGNOSIS — Z66 Do not resuscitate: Secondary | ICD-10-CM | POA: Diagnosis not present

## 2021-06-21 DIAGNOSIS — Z8673 Personal history of transient ischemic attack (TIA), and cerebral infarction without residual deficits: Secondary | ICD-10-CM | POA: Diagnosis not present

## 2021-06-21 DIAGNOSIS — J449 Chronic obstructive pulmonary disease, unspecified: Secondary | ICD-10-CM | POA: Diagnosis not present

## 2021-06-21 DIAGNOSIS — Z9181 History of falling: Secondary | ICD-10-CM | POA: Diagnosis not present

## 2021-06-21 DIAGNOSIS — E1149 Type 2 diabetes mellitus with other diabetic neurological complication: Secondary | ICD-10-CM | POA: Diagnosis not present

## 2021-06-21 DIAGNOSIS — S82832S Other fracture of upper and lower end of left fibula, sequela: Secondary | ICD-10-CM | POA: Diagnosis not present

## 2021-06-21 DIAGNOSIS — A419 Sepsis, unspecified organism: Secondary | ICD-10-CM | POA: Diagnosis not present

## 2021-06-21 DIAGNOSIS — G40909 Epilepsy, unspecified, not intractable, without status epilepticus: Secondary | ICD-10-CM | POA: Diagnosis not present

## 2021-06-21 DIAGNOSIS — S8252XA Displaced fracture of medial malleolus of left tibia, initial encounter for closed fracture: Secondary | ICD-10-CM | POA: Diagnosis not present

## 2021-06-21 DIAGNOSIS — S8262XA Displaced fracture of lateral malleolus of left fibula, initial encounter for closed fracture: Secondary | ICD-10-CM | POA: Diagnosis not present

## 2021-06-21 DIAGNOSIS — G20 Parkinson's disease: Secondary | ICD-10-CM | POA: Diagnosis not present

## 2021-06-21 DIAGNOSIS — J9621 Acute and chronic respiratory failure with hypoxia: Secondary | ICD-10-CM | POA: Diagnosis not present

## 2021-06-21 LAB — CBC WITH DIFFERENTIAL/PLATELET
Abs Immature Granulocytes: 0.32 10*3/uL — ABNORMAL HIGH (ref 0.00–0.07)
Basophils Absolute: 0 10*3/uL (ref 0.0–0.1)
Basophils Relative: 0 %
Eosinophils Absolute: 0 10*3/uL (ref 0.0–0.5)
Eosinophils Relative: 0 %
HCT: 30.8 % — ABNORMAL LOW (ref 36.0–46.0)
Hemoglobin: 9.2 g/dL — ABNORMAL LOW (ref 12.0–15.0)
Immature Granulocytes: 2 %
Lymphocytes Relative: 9 %
Lymphs Abs: 1.6 10*3/uL (ref 0.7–4.0)
MCH: 29.6 pg (ref 26.0–34.0)
MCHC: 29.9 g/dL — ABNORMAL LOW (ref 30.0–36.0)
MCV: 99 fL (ref 80.0–100.0)
Monocytes Absolute: 1.4 10*3/uL — ABNORMAL HIGH (ref 0.1–1.0)
Monocytes Relative: 8 %
Neutro Abs: 14.2 10*3/uL — ABNORMAL HIGH (ref 1.7–7.7)
Neutrophils Relative %: 81 %
Platelets: 298 10*3/uL (ref 150–400)
RBC: 3.11 MIL/uL — ABNORMAL LOW (ref 3.87–5.11)
RDW: 13.8 % (ref 11.5–15.5)
WBC: 17.5 10*3/uL — ABNORMAL HIGH (ref 4.0–10.5)
nRBC: 0.3 % — ABNORMAL HIGH (ref 0.0–0.2)

## 2021-06-21 LAB — COMPREHENSIVE METABOLIC PANEL
ALT: 13 U/L (ref 0–44)
AST: 14 U/L — ABNORMAL LOW (ref 15–41)
Albumin: 2.7 g/dL — ABNORMAL LOW (ref 3.5–5.0)
Alkaline Phosphatase: 41 U/L (ref 38–126)
Anion gap: 4 — ABNORMAL LOW (ref 5–15)
BUN: 19 mg/dL (ref 8–23)
CO2: 35 mmol/L — ABNORMAL HIGH (ref 22–32)
Calcium: 8.9 mg/dL (ref 8.9–10.3)
Chloride: 105 mmol/L (ref 98–111)
Creatinine, Ser: 1.28 mg/dL — ABNORMAL HIGH (ref 0.44–1.00)
GFR, Estimated: 39 mL/min — ABNORMAL LOW (ref >60)
Glucose, Bld: 117 mg/dL — ABNORMAL HIGH (ref 70–99)
Potassium: 4 mmol/L (ref 3.5–5.1)
Sodium: 144 mmol/L (ref 135–145)
Total Bilirubin: 0.5 mg/dL (ref 0.3–1.2)
Total Protein: 5.3 g/dL — ABNORMAL LOW (ref 6.5–8.1)

## 2021-06-21 LAB — GLUCOSE, CAPILLARY: Glucose-Capillary: 104 mg/dL — ABNORMAL HIGH (ref 70–99)

## 2021-06-21 LAB — PHOSPHORUS: Phosphorus: 3.7 mg/dL (ref 2.5–4.6)

## 2021-06-21 LAB — MAGNESIUM: Magnesium: 2.2 mg/dL (ref 1.7–2.4)

## 2021-06-21 MED ORDER — PREDNISONE 10 MG PO TABS: ORAL_TABLET | ORAL | Status: DC

## 2021-06-21 MED ORDER — ALPRAZOLAM 0.25 MG PO TABS: 0.2500 mg | ORAL_TABLET | Freq: Three times a day (TID) | ORAL | 0 refills | Status: DC | PRN

## 2021-06-21 MED ORDER — DM-GUAIFENESIN ER 30-600 MG PO TB12: 1.0000 | ORAL_TABLET | Freq: Two times a day (BID) | ORAL | 0 refills | Status: AC

## 2021-06-21 MED ORDER — ENSURE ENLIVE PO LIQD: 237.0000 mL | Freq: Two times a day (BID) | ORAL | 12 refills | Status: AC

## 2021-06-21 MED ORDER — PREDNISONE 20 MG PO TABS
40.0000 mg | ORAL_TABLET | Freq: Every day | ORAL | Status: DC
  Administered 2021-06-21: 40 mg via ORAL
  Filled 2021-06-21: qty 2

## 2021-06-21 MED ORDER — MECLIZINE HCL 12.5 MG PO TABS: 12.5000 mg | ORAL_TABLET | Freq: Three times a day (TID) | ORAL | 0 refills | Status: DC | PRN

## 2021-06-21 MED ORDER — FLUCONAZOLE 100 MG PO TABS: 100.0000 mg | ORAL_TABLET | Freq: Every day | ORAL | 0 refills | Status: AC

## 2021-06-21 MED ORDER — LEVETIRACETAM 250 MG PO TABS: 250.0000 mg | ORAL_TABLET | Freq: Every day | ORAL | Status: DC

## 2021-06-21 NOTE — TOC Transition Note (Signed)
Transition of Care [facility]) - CM/SW Discharge Note   Patient Details  Name: Tiffany Hartman MRN: [id number] Date of Birth: February 14, 1927  Transition of Care [facility]) CM/SW Contact:  Lynnell Catalan, RN Phone Number: 06/21/2021, 10:34 AM   Clinical Narrative:    Pt to dc to [facility] room 125. [facility] to be called for transport. Yellow DNR on the chart for transport. RN to call report to [phone]     Patient Goals and CMS Choice Patient states their goals for this hospitalization and ongoing recovery are:: to return home following rehab   Choice offered to / list presented to : Patient, Adult Children      Discharge Plan and Services In-house Referral: Clinical Social Work   Post Acute Care Choice: [place]             Readmission Risk Interventions    06/17/2021    1:05 PM  Readmission Risk Prevention Plan  Transportation Screening Complete  PCP or Specialist Appt within 3-5 Days Complete  HRI or [place] Complete  Social Work Consult for [place] Planning/Counseling Complete  Palliative Care Screening Not Applicable  Medication Review [employer]) Complete

## 2021-06-21 NOTE — Discharge Summary (Signed)
Physician Discharge Summary  Tiffany Tiffany Hartman RSW:[id number] DOB: 15-Nov-1927 DOA: 06/13/2021  PCP: Carrolyn Meiers, MD  Admit date: 06/13/2021 Discharge date: 06/21/2021  Admitted From: home Discharge disposition: SNF   Recommendations for Outpatient Follow-Up:   O2 1-2L Monitor blood sugars BMP/cbc 1 week Follow up with neurology- Dr. Carles Collet- will need appointment  Continue flutter valve    Discharge Diagnosis:   Principal Problem:   Multifocal pneumonia Active Problems:   Diabetes ([place])   COPD (chronic obstructive pulmonary disease) ([place])   GERD without esophagitis   Dyslipidemia   Depression with anxiety   Acute on chronic respiratory failure with hypoxia (HCC)   Stage 3b chronic kidney disease (CKD) (HCC)   MGUS (monoclonal gammopathy of unknown significance)   Glaucoma   Generalized weakness   Failure to thrive in adult   HTN (hypertension), benign   Oropharyngeal candidiasis   Black hairy tongue   Septic shock (HCC)   Acute diarrhea   Altered mental status    Discharge Condition: Improved.  Diet recommendation: dys 2 diet  Wound care: None.  Code status: DNR   History of Present Illness:   Tiffany Tiffany Hartman is a 88 y.o. female with medical history significant of HTN, HLD, DM2, MGUS, CKD3b, chronic hypoxic respiratory failure on 2L [place], CVA. Presenting with weakness, lethargy. History from granddaughter at bedside. She reports that the patient has not eaten much at all for the last couple of weeks. She has become immobile during that time. She is not as interactive as her normal self. She has been very somnolent during this time. Her granddaughter also reports cough and congestion over the last week. The patient has not had any therapy for it. She has not have any fevers, N/V/D. She has not had any other complaints. When her condition did not improve this morning, her family decided to call EMS.    Hospital Course by Problem:   Septic shock - On  admission<36 C, RR> 20, hypotensive not responsive to fluid resuscitation, -resolved   Multifocal PNA/Right mainstem obstruction/CAP -has been treated with abx -wean steroids to off -nebs -flutter valve   Acute on Chronic Hypoxic Respiratory Failure -wean off O2 if able-- wears 2L at home   Oropharyngeal candidiasis - Diflucan x 14 days   Leukocytosis -Patient on steroids   Failure to Thrive/generalized weakness/lethargy  - dietitian consult    Acute diarrhea - 6/11 stool culture negative - 6/11 C. Difficile negative -resolved   HTN/Hypotensive/Septic Shock -BP low end of normal -hold home meds -encourage PO intake   Altered mental status - Minimize sedating medication -resolved   DM type II controlled with hyperglycemia - 6/9 hemoglobin A1c= 5.6 -resume home meds        AKI on CKD stage 3b (baseline Cr 1.3--1.4) -stable -outpatient follow up  MGUS     - continue outpt follow up   HLD -Pravastatin 10 mg daily     Anxiety/depression     - continue home regimen   Parkinson disease - Patient has not seen Parkinson's neurologist in 4 to 5 years however does have signs and symptoms consistent with Parkinson's. -upon discharge patient to establish care with Dr. Wells Guiles Tat at [facility] outpatient neurology is a movement disorder specialist   Glaucoma    - continue home regimen   Hypokalemia/hypomagnesemia - repleted   Hypophosphatemia - repleted   Hypernatremia -resolved    Obesity  Estimated body mass index is 42.7 kg/m as calculated  from the following:   Height as of this encounter: '5\' 1"'$  (1.549 m).   Weight as of this encounter: 102.5 kg.    Dysphagia - speech eval- DYS diet     Nutrition Status: Nutrition Problem: Inadequate oral intake Etiology: acute illness, inability to eat Signs/Symptoms: per patient/family report, NPO status Interventions: Refer to RD note for recommendations      Medical Consultants:      Discharge  Exam:   Vitals:   06/20/21 1947 06/21/21 0539  BP: (!) 118/51 104/78  Pulse: 88 70  Resp: 17 16  Temp: 98.4 F (36.9 C) 98.4 F (36.9 C)  SpO2: 98% 100%   Vitals:   06/20/21 0922 06/20/21 1301 06/20/21 1947 06/21/21 0539  BP: (!) 108/44 126/77 (!) 118/51 104/78  Pulse: 78 79 88 70  Resp: '18 18 17 16  '$ Temp: 98.7 F (37.1 C) 98.7 F (37.1 C) 98.4 F (36.9 C) 98.4 F (36.9 C)  TempSrc: Oral Oral Oral Oral  SpO2: 93% 99% 98% 100%  Weight:      Height:        General exam: Appears calm and comfortable.     The results of significant diagnostics from this hospitalization (including imaging, microbiology, ancillary and laboratory) are listed below for reference.     Procedures and Diagnostic Studies:   CT CHEST WO CONTRAST  Result Date: 06/13/2021 CLINICAL DATA:  Pneumonia. Complication suspected. Right upper lobe collapse. Progressive decline. Poor p.o. intake and confusion. Approximally 3 weeks since patient has ambulated. Left-sided facial droop. EXAM: CT CHEST WITHOUT CONTRAST; CT ABDOMEN AND PELVIS WITHOUT CONTRAST TECHNIQUE: Multidetector CT imaging of the chest, abdomen, and pelvis was performed following the standard protocol without IV contrast. RADIATION DOSE REDUCTION: This exam was performed according to the departmental dose-optimization program which includes automated exposure control, adjustment of the mA and/or kV according to patient size and/or use of iterative reconstruction technique. COMPARISON:  AP chest 06/13/2021 and 05/26/2021; pelvis and right hip radiographs 05/04/2021; CT chest 11/13/2017 FINDINGS: Cardiovascular: Heart size is mildly enlarged. No pericardial effusion. Moderate to high-grade coronary artery calcifications. No thoracic aortic aneurysm. Moderate atherosclerotic calcifications within the thoracic aorta. Mediastinum/Nodes: No axillary, mediastinal, or hilar pathologically enlarged lymph nodes by CT criteria. Left axillary surgical clips. The  right thyroid lobe is again enlarged. An approximate 2 cm low-density right thyroid lobe nodule is grossly unchanged from prior. No follow-up imaging recommended. The esophagus follows a normal course of normal caliber. Lungs/Pleura: There is opacification of the distal right mainstem bronchus and portions of the proximal right upper lobe segmental airways. There is high-grade opacification of the posterior right upper lobe with moderate opacification of the anterior and apical segments. There is also partial opacification of multiple portions of the posterolateral right lower lobe segmental airways. Small right pleural effusion. There are heterogeneous ground-glass and centrilobular opacities within the mid to posterior right upper lobe and mid to posterior right lower lobe, presumably infectious/inflammatory process. Mild ground-glass and curvilinear likely subsegmental atelectasis and/or scarring within the posterior left lung. No pneumothorax. Musculoskeletal: Moderate multilevel disc space narrowing and high-grade anterior right bridging osteophytes throughout the thoracic spine. Vertebral body heights are maintained. Severe bilateral glenohumeral osteoarthritis. -- Lack of intra-articular fluid limits evaluation of the abdominal and pelvic organ parenchyma. The following findings are made within this limitation. Hepatobiliary: Smooth liver contours. No gross liver lesion is seen. Tiny dependent gallstone. Pancreas: Grossly unremarkable. No gross pancreatic ductal dilatation or surrounding inflammatory changes. Spleen: Normal in size  without focal abnormality. Adrenals/Urinary Tract: Normal adrenals. No renal stone or hydronephrosis. Within the limitations of lack of IV contrast, no contour deforming renal mass. The urinary bladder is only minimally distended, limiting evaluation but grossly unremarkable. Stomach/Bowel: High-grade sigmoid and descending colon and moderate transverse greater than ascending colon  diverticulosis with underdistention and likely chronic mild wall thickening. No definite acute surrounding inflammatory fat stranding. The terminal ileum is unremarkable. The appendix is within normal limits. No dilated loops of bowel to indicate bowel obstruction. Vascular/Lymphatic: No abdominal aortic aneurysm. High-grade atherosclerotic calcifications. No enlarged abdominal or pelvic lymph nodes. Reproductive: The uterus appears to be surgically absent. No gross adnexal abnormality is seen. Other: No abdominal wall hernia. No free air or free fluid. Musculoskeletal: Grade 1 anterolisthesis of L4 on L5. Moderate to severe L2-3 disc space narrowing and degenerative vacuum phenomenon. IMPRESSION: Chest: 1. Distal right mainstem bronchus and portions of the proximal right upper lobe segmental airway opacification with postobstructive atelectasis greatest within the posterior segment of the right upper lobe. This may be secondary to mucosal opacification. There are airspace opacities concerning for pneumonia within the right upper and lower lobes. Small right pleural effusion. It is difficult to exclude the presence of underlying solid pulmonary nodules. Consider follow-up PA and lateral radiographs 6 weeks after treatment to ensure resolution of the right lung findings. 2. Mild cardiomegaly. Abdomen/pelvis: 1. High-grade distal and more moderate proximal diffuse colonic diverticulosis without inflammatory changes to indicate acute diverticulitis. 2. Tiny dependent gallstone. 3. Grade 1 anterolisthesis of L4 on L5, degenerative in etiology without a pars defect visualized. Aortic Atherosclerosis (ICD10-I70.0). Electronically Signed   By: Yvonne Kendall M.D.   On: 06/13/2021 12:06   CT Abdomen Pelvis Wo Contrast  Result Date: 06/13/2021 CLINICAL DATA:  Pneumonia. Complication suspected. Right upper lobe collapse. Progressive decline. Poor p.o. intake and confusion. Approximally 3 weeks since patient has ambulated.  Left-sided facial droop. EXAM: CT CHEST WITHOUT CONTRAST; CT ABDOMEN AND PELVIS WITHOUT CONTRAST TECHNIQUE: Multidetector CT imaging of the chest, abdomen, and pelvis was performed following the standard protocol without IV contrast. RADIATION DOSE REDUCTION: This exam was performed according to the departmental dose-optimization program which includes automated exposure control, adjustment of the mA and/or kV according to patient size and/or use of iterative reconstruction technique. COMPARISON:  AP chest 06/13/2021 and 05/26/2021; pelvis and right hip radiographs 05/04/2021; CT chest 11/13/2017 FINDINGS: Cardiovascular: Heart size is mildly enlarged. No pericardial effusion. Moderate to high-grade coronary artery calcifications. No thoracic aortic aneurysm. Moderate atherosclerotic calcifications within the thoracic aorta. Mediastinum/Nodes: No axillary, mediastinal, or hilar pathologically enlarged lymph nodes by CT criteria. Left axillary surgical clips. The right thyroid lobe is again enlarged. An approximate 2 cm low-density right thyroid lobe nodule is grossly unchanged from prior. No follow-up imaging recommended. The esophagus follows a normal course of normal caliber. Lungs/Pleura: There is opacification of the distal right mainstem bronchus and portions of the proximal right upper lobe segmental airways. There is high-grade opacification of the posterior right upper lobe with moderate opacification of the anterior and apical segments. There is also partial opacification of multiple portions of the posterolateral right lower lobe segmental airways. Small right pleural effusion. There are heterogeneous ground-glass and centrilobular opacities within the mid to posterior right upper lobe and mid to posterior right lower lobe, presumably infectious/inflammatory process. Mild ground-glass and curvilinear likely subsegmental atelectasis and/or scarring within the posterior left lung. No pneumothorax.  Musculoskeletal: Moderate multilevel disc space narrowing and high-grade anterior right bridging osteophytes  throughout the thoracic spine. Vertebral body heights are maintained. Severe bilateral glenohumeral osteoarthritis. -- Lack of intra-articular fluid limits evaluation of the abdominal and pelvic organ parenchyma. The following findings are made within this limitation. Hepatobiliary: Smooth liver contours. No gross liver lesion is seen. Tiny dependent gallstone. Pancreas: Grossly unremarkable. No gross pancreatic ductal dilatation or surrounding inflammatory changes. Spleen: Normal in size without focal abnormality. Adrenals/Urinary Tract: Normal adrenals. No renal stone or hydronephrosis. Within the limitations of lack of IV contrast, no contour deforming renal mass. The urinary bladder is only minimally distended, limiting evaluation but grossly unremarkable. Stomach/Bowel: High-grade sigmoid and descending colon and moderate transverse greater than ascending colon diverticulosis with underdistention and likely chronic mild wall thickening. No definite acute surrounding inflammatory fat stranding. The terminal ileum is unremarkable. The appendix is within normal limits. No dilated loops of bowel to indicate bowel obstruction. Vascular/Lymphatic: No abdominal aortic aneurysm. High-grade atherosclerotic calcifications. No enlarged abdominal or pelvic lymph nodes. Reproductive: The uterus appears to be surgically absent. No gross adnexal abnormality is seen. Other: No abdominal wall hernia. No free air or free fluid. Musculoskeletal: Grade 1 anterolisthesis of L4 on L5. Moderate to severe L2-3 disc space narrowing and degenerative vacuum phenomenon. IMPRESSION: Chest: 1. Distal right mainstem bronchus and portions of the proximal right upper lobe segmental airway opacification with postobstructive atelectasis greatest within the posterior segment of the right upper lobe. This may be secondary to mucosal  opacification. There are airspace opacities concerning for pneumonia within the right upper and lower lobes. Small right pleural effusion. It is difficult to exclude the presence of underlying solid pulmonary nodules. Consider follow-up PA and lateral radiographs 6 weeks after treatment to ensure resolution of the right lung findings. 2. Mild cardiomegaly. Abdomen/pelvis: 1. High-grade distal and more moderate proximal diffuse colonic diverticulosis without inflammatory changes to indicate acute diverticulitis. 2. Tiny dependent gallstone. 3. Grade 1 anterolisthesis of L4 on L5, degenerative in etiology without a pars defect visualized. Aortic Atherosclerosis (ICD10-I70.0). Electronically Signed   By: Yvonne Kendall M.D.   On: 06/13/2021 12:06   CT Head Wo Contrast  Result Date: 06/13/2021 CLINICAL DATA:  Mental status changes, progressive decline, poor p.o. intake, confusion, LEFT facial droop, has not ambulated in 3 weeks EXAM: CT HEAD WITHOUT CONTRAST TECHNIQUE: Contiguous axial images were obtained from the base of the skull through the vertex without intravenous contrast. RADIATION DOSE REDUCTION: This exam was performed according to the departmental dose-optimization program which includes automated exposure control, adjustment of the mA and/or kV according to patient size and/or use of iterative reconstruction technique. COMPARISON:  05/26/2021 FINDINGS: Brain: Generalized atrophy. Normal ventricular morphology. No midline shift or mass effect. Small vessel chronic ischemic changes of deep cerebral white matter. No intracranial hemorrhage, mass lesion, evidence of acute infarction, or extra-axial fluid collection. Vascular: Atherosclerotic calcification of internal carotid arteries at skull base Skull: Intact.  Hyperostosis frontalis interna. Sinuses/Orbits: Clear Other: N/A IMPRESSION: Atrophy with small vessel chronic ischemic changes of deep cerebral white matter. No acute intracranial abnormalities.  Electronically Signed   By: Lavonia Dana M.D.   On: 06/13/2021 09:46   DG Chest Port 1 View  Result Date: 06/13/2021 CLINICAL DATA:  Weakness.  Increased failure to thrive.  Confusion. EXAM: PORTABLE CHEST 1 VIEW COMPARISON:  AP chest 05/26/2021 FINDINGS: Cardiac silhouette is at the upper limits of normal size for AP technique. Mediastinal contours are grossly within normal limits. Calcification is seen within the aortic arch. There is right-sided volume loss and homogeneous  opacification within the superomedial right hemithorax indicating right upper lobe collapse. There is mild rightward deviation of the trachea due to the volume loss. The left lung is clear. No pleural effusion is seen. No pneumothorax. Moderate multilevel degenerative disc space narrowing and endplate osteophytes of the thoracic spine. IMPRESSION: 1. New right upper lobe collapse with associated volume loss and mild rightward mediastinal shift. 2. The left lung is clear. Electronically Signed   By: Yvonne Kendall M.D.   On: 06/13/2021 09:19     Labs:   Basic Metabolic Panel: Recent Labs  Lab 06/17/21 0308 06/18/21 0310 06/19/21 0331 06/20/21 0308 06/21/21 0545  NA 145 145 140 140 144  K 3.2* 3.6 3.5 4.1 4.0  CL 104 107 103 102 105  CO2 34* 32 31 32 35*  GLUCOSE 179* 130* 150* 102* 117*  BUN '20 18 16 17 19  '$ CREATININE 1.07* 1.07* 1.04* 1.23* 1.28*  CALCIUM 8.9 9.2 8.7* 8.4* 8.9  MG 2.0 1.7 1.7 1.5* 2.2  PHOS 2.0* 3.2 3.1 3.2 3.7   GFR Estimated Creatinine Clearance: 30.9 mL/min (A) (by C-G formula based on SCr of 1.28 mg/dL (H)). Liver Function Tests: Recent Labs  Lab 06/17/21 0308 06/18/21 0310 06/19/21 0331 06/20/21 0308 06/21/21 0545  AST 14* 17 14* 16 14*  ALT '11 14 13 11 13  '$ ALKPHOS 38 44 40 36* 41  BILITOT 0.5 0.4 0.4 0.5 0.5  PROT 5.6* 5.8* 5.0* 4.8* 5.3*  ALBUMIN 2.7* 2.8* 2.4* 2.4* 2.7*   No results for input(s): "LIPASE", "AMYLASE" in the last 168 hours. No results for input(s): "AMMONIA" in  the last 168 hours. Coagulation profile No results for input(s): "INR", "PROTIME" in the last 168 hours.  CBC: Recent Labs  Lab 06/17/21 0308 06/18/21 0310 06/19/21 0331 06/20/21 0308 06/21/21 0545  WBC 15.1* 17.0* 16.3* 15.2* 17.5*  NEUTROABS 13.0* 14.3* 13.3* 12.0* 14.2*  HGB 9.0* 9.6* 9.1* 9.1* 9.2*  HCT 30.8* 32.2* 30.5* 30.1* 30.8*  MCV 101.0* 100.0 98.7 98.4 99.0  PLT 429* 400 336 274 298   Cardiac Enzymes: No results for input(s): "CKTOTAL", "CKMB", "CKMBINDEX", "TROPONINI" in the last 168 hours. BNP: Invalid input(s): "POCBNP" CBG: Recent Labs  Lab 06/20/21 0813 06/20/21 1200 06/20/21 1638 06/20/21 2102 06/21/21 0729  GLUCAP 85 130* 147* 220* 104*   D-Dimer No results for input(s): "DDIMER" in the last 72 hours. Hgb A1c No results for input(s): "HGBA1C" in the last 72 hours. Lipid Profile No results for input(s): "CHOL", "HDL", "LDLCALC", "TRIG", "CHOLHDL", "LDLDIRECT" in the last 72 hours. Thyroid function studies No results for input(s): "TSH", "T4TOTAL", "T3FREE", "THYROIDAB" in the last 72 hours.  Invalid input(s): "FREET3" Anemia work up No results for input(s): "VITAMINB12", "FOLATE", "FERRITIN", "TIBC", "IRON", "RETICCTPCT" in the last 72 hours. Microbiology Recent Results (from the past 240 hour(s))  SARS Coronavirus 2 by RT PCR (hospital order, performed in [facility] hospital lab) *cepheid single result test* Anterior Nasal Swab     Status: None   Collection Time: 06/13/21  9:59 AM   Specimen: Anterior Nasal Swab  Result Value Ref Range Status   SARS Coronavirus 2 by RT PCR NEGATIVE NEGATIVE Final    Comment: (NOTE) SARS-CoV-2 target nucleic acids are NOT DETECTED.  The SARS-CoV-2 RNA is generally detectable in upper and lower respiratory specimens during the acute phase of infection. The lowest concentration of SARS-CoV-2 viral copies this assay can detect is 250 copies / mL. A negative result does not preclude SARS-CoV-2 infection and  should  not be used as the sole basis for treatment or other patient management decisions.  A negative result may occur with improper specimen collection / handling, submission of specimen other than nasopharyngeal swab, presence of viral mutation(s) within the areas targeted by this assay, and inadequate number of viral copies (<250 copies / mL). A negative result must be combined with clinical observations, patient history, and epidemiological information.  Fact Sheet for Patients:   https://www.patel.info/  Fact Sheet for Healthcare Providers: https://hall.com/  This test is not yet approved or  cleared by the [place] FDA and has been authorized for detection and/or diagnosis of SARS-CoV-2 by FDA under an Emergency Use Authorization (EUA).  This EUA will remain in effect (meaning this test can be used) for the duration of the COVID-19 declaration under Section 564(b)(1) of the Act, 21 U.S.C. section 360bbb-3(b)(1), unless the authorization is terminated or revoked sooner.  Performed at [facility], [place] [address] [place], [place] [address]   Culture, blood (routine x 2)     Status: None   Collection Time: 06/13/21 12:30 PM   Specimen: BLOOD RIGHT HAND  Result Value Ref Range Status   Specimen Description   Final    BLOOD RIGHT HAND Performed at [place] [address], [place], [place] [address]    Special Requests   Final    BOTTLES DRAWN AEROBIC AND ANAEROBIC Blood Culture results may not be optimal due to an inadequate volume of blood received in culture bottles Performed at [facility] [address], [place], [place] [address]    Culture   Final    NO GROWTH 5 DAYS Performed at [facility], [place] [address] [place], [place] [address]    Report Status 06/18/2021 FINAL  Final  Culture, blood (routine x 2)     Status: None   Collection Time: 06/13/21  12:30 PM   Specimen: BLOOD RIGHT HAND  Result Value Ref Range Status   Specimen Description   Final    BLOOD RIGHT HAND Performed at [facility] [address], [place], [place] [address]    Special Requests   Final    BOTTLES DRAWN AEROBIC AND ANAEROBIC Blood Culture adequate volume Performed at [facility] [address], [place], [place] [address]    Culture   Final    NO GROWTH 5 DAYS Performed at [facility], [place] [address] [place], [place] [address]    Report Status 06/18/2021 FINAL  Final  MRSA Next Gen by PCR, Nasal     Status: None   Collection Time: 06/14/21  1:31 AM   Specimen: Nasal Mucosa; Nasal Swab  Result Value Ref Range Status   MRSA by PCR Next Gen NOT DETECTED NOT DETECTED Final    Comment: (NOTE) The GeneXpert MRSA Assay (FDA approved for NASAL specimens only), is one component of a comprehensive MRSA colonization surveillance program. It is not intended to diagnose MRSA infection nor to guide or monitor treatment for MRSA infections. Test performance is not FDA approved in patients less than 58 years old. Performed at [facility], [place] [address] [place], [place] [address]   Respiratory (~20 pathogens) panel by PCR     Status: None   Collection Time: 06/14/21  7:46 AM   Specimen: Nasopharyngeal Swab; Respiratory  Result Value Ref Range Status   Adenovirus NOT DETECTED NOT DETECTED Final   Coronavirus 229E NOT DETECTED NOT DETECTED  Final    Comment: (NOTE) The Coronavirus on the Respiratory Panel, DOES NOT test for the novel  Coronavirus (2019 nCoV)    Coronavirus HKU1 NOT DETECTED NOT DETECTED Final   Coronavirus NL63 NOT DETECTED NOT DETECTED Final   Coronavirus OC43 NOT DETECTED NOT DETECTED Final   Metapneumovirus NOT DETECTED NOT DETECTED Final   Rhinovirus / Enterovirus NOT DETECTED NOT DETECTED Final   Influenza A NOT DETECTED NOT DETECTED Final   Influenza B NOT DETECTED NOT  DETECTED Final   Parainfluenza Virus 1 NOT DETECTED NOT DETECTED Final   Parainfluenza Virus 2 NOT DETECTED NOT DETECTED Final   Parainfluenza Virus 3 NOT DETECTED NOT DETECTED Final   Parainfluenza Virus 4 NOT DETECTED NOT DETECTED Final   Respiratory Syncytial Virus NOT DETECTED NOT DETECTED Final   Bordetella pertussis NOT DETECTED NOT DETECTED Final   Bordetella Parapertussis NOT DETECTED NOT DETECTED Final   Chlamydophila pneumoniae NOT DETECTED NOT DETECTED Final   Mycoplasma pneumoniae NOT DETECTED NOT DETECTED Final    Comment: Performed at [facility], [place] [address], [place], [place] [address]  Gastrointestinal Panel by PCR , Stool     Status: None   Collection Time: 06/16/21  6:52 PM   Specimen: Stool  Result Value Ref Range Status   Campylobacter species NOT DETECTED NOT DETECTED Final   Plesimonas shigelloides NOT DETECTED NOT DETECTED Final   Salmonella species NOT DETECTED NOT DETECTED Final   Yersinia enterocolitica NOT DETECTED NOT DETECTED Final   Vibrio species NOT DETECTED NOT DETECTED Final   Vibrio cholerae NOT DETECTED NOT DETECTED Final   Enteroaggregative E coli (EAEC) NOT DETECTED NOT DETECTED Final   Enteropathogenic E coli (EPEC) NOT DETECTED NOT DETECTED Final   Enterotoxigenic E coli (ETEC) NOT DETECTED NOT DETECTED Final   Shiga like toxin producing E coli (STEC) NOT DETECTED NOT DETECTED Final   Shigella/Enteroinvasive E coli (EIEC) NOT DETECTED NOT DETECTED Final   Cryptosporidium NOT DETECTED NOT DETECTED Final   Cyclospora cayetanensis NOT DETECTED NOT DETECTED Final   Entamoeba histolytica NOT DETECTED NOT DETECTED Final   Giardia lamblia NOT DETECTED NOT DETECTED Final   Adenovirus F40/41 NOT DETECTED NOT DETECTED Final   Astrovirus NOT DETECTED NOT DETECTED Final   Norovirus GI/GII NOT DETECTED NOT DETECTED Final   Rotavirus A NOT DETECTED NOT DETECTED Final   Sapovirus (I, II, IV, and V) NOT DETECTED NOT DETECTED Final    Comment:  Performed at [facility], [address] [place], [place] [address]  C Difficile Quick Screen (NO PCR Reflex)     Status: None   Collection Time: 06/16/21  6:52 PM   Specimen: Stool  Result Value Ref Range Status   C Diff antigen NEGATIVE NEGATIVE Final   C Diff toxin NEGATIVE NEGATIVE Final   C Diff interpretation No C. difficile detected.  Final    Comment: Performed at [facility], [place] [address] [place], [place] [address]     Discharge Instructions:   Discharge Instructions     Discharge instructions   Complete by: As directed    Dys 2 carb mod   Increase activity slowly   Complete by: As directed    No wound care   Complete by: As directed       Allergies as of 06/21/2021       Reactions   Chloral Hydrate Other (See Comments)   unknown        Medication List  STOP taking these medications    amLODipine 5 MG tablet Commonly known as: NORVASC   ferrous gluconate 324 MG tablet Commonly known as: FERGON   furosemide 20 MG tablet Commonly known as: LASIX   Klor-Con M20 20 MEQ tablet Generic drug: potassium chloride SA   losartan 25 MG tablet Commonly known as: COZAAR   NON FORMULARY   omeprazole 40 MG capsule Commonly known as: PRILOSEC   ondansetron 4 MG tablet Commonly known as: ZOFRAN   oxyCODONE-acetaminophen 5-325 MG tablet Commonly known as: Percocet   polyethylene glycol 17 g packet Commonly known as: MIRALAX / GLYCOLAX       TAKE these medications    acetaminophen 500 MG tablet Commonly known as: TYLENOL Take 500 mg by mouth in the morning and at bedtime.   albuterol 108 (90 Base) MCG/ACT inhaler Commonly known as: VENTOLIN HFA Inhale 2 puffs into the lungs every 6 (six) hours as needed for wheezing or shortness of breath. What changed: how much to take   ALPRAZolam 0.25 MG tablet Commonly known as: XANAX Take 1 tablet (0.25 mg total) by mouth 3 (three) times daily as needed for anxiety or  sleep.   Calcium Carbonate-Vitamin D 600-200 MG-UNIT Tabs Take 1 tablet by mouth every evening.   clopidogrel 75 MG tablet Commonly known as: PLAVIX Take 1 tablet (75 mg total) by mouth daily with breakfast.   dextromethorphan-guaiFENesin 30-600 MG 12hr tablet Commonly known as: MUCINEX DM Take 1 tablet by mouth 2 (two) times daily for 5 days.   donepezil 10 MG tablet Commonly known as: ARICEPT Take 1 tablet (10 mg total) by mouth at bedtime.   feeding supplement Liqd Take 237 mLs by mouth 2 (two) times daily between meals.   ferrous sulfate 325 (65 FE) MG tablet Take 325 mg by mouth at bedtime.   fluconazole 100 MG tablet Commonly known as: DIFLUCAN Take 1 tablet (100 mg total) by mouth daily for 6 days.   FLUoxetine 40 MG capsule Commonly known as: PROZAC Take 40 mg by mouth daily. What changed: Another medication with the same name was removed. Continue taking this medication, and follow the directions you see here.   folic acid 1 MG tablet Commonly known as: FOLVITE Take 1 tablet (1 mg total) by mouth daily.   gabapentin 300 MG capsule Commonly known as: Neurontin Take 1 capsule (300 mg total) by mouth 3 (three) times daily. What changed: when to take this   glipiZIDE 2.5 MG 24 hr tablet Commonly known as: GLUCOTROL XL Take 2.5 mg by mouth daily.   ipratropium-albuterol 0.5-2.5 (3) MG/3ML Soln Commonly known as: DUONEB Take 3 mLs by nebulization 2 (two) times daily. And every 6 hours as needed What changed:  when to take this additional instructions   latanoprost 0.005 % ophthalmic solution Commonly known as: XALATAN Place 1 drop into both eyes at bedtime. What changed: how to take this   levETIRAcetam 250 MG tablet Commonly known as: KEPPRA Take 1 tablet (250 mg total) by mouth daily.   lovastatin 10 MG tablet Commonly known as: MEVACOR Take 1 tablet (10 mg total) by mouth at bedtime.   meclizine 12.5 MG tablet Commonly known as: ANTIVERT Take 1  tablet (12.5 mg total) by mouth 3 (three) times daily as needed for dizziness.   OXYGEN Inhale 2 L/min into the lungs continuous. Titrate as needed to maintain satuation > 90%   predniSONE 10 MG tablet Commonly known as: DELTASONE 40 mg x 2 days then  $'30mg'U$  x 2 days then '20mg'$  x 2 days then '10mg'$  x 2 days What changed:  how much to take how to take this when to take this reasons to take this additional instructions   timolol 0.5 % ophthalmic solution Commonly known as: TIMOPTIC Place 1 drop into the left eye daily.   TUMS PO Take 2 tablets by mouth daily as needed (acid reflux).        Follow-up Information     Fanta, Normajean Baxter, MD Follow up in 1 week(s).   Specialty: Internal Medicine Contact information: [place] [address] [phone]                  Time coordinating discharge: 45 min  Signed:  Geradine Girt DO  [facility] 06/21/2021, 7:55 AM

## 2021-06-24 ENCOUNTER — Encounter: Payer: Self-pay | Admitting: Adult Health

## 2021-06-24 ENCOUNTER — Non-Acute Institutional Stay (SKILLED_NURSING_FACILITY): Payer: Medicare Other | Admitting: Adult Health

## 2021-06-24 DIAGNOSIS — J9621 Acute and chronic respiratory failure with hypoxia: Secondary | ICD-10-CM

## 2021-06-24 DIAGNOSIS — I129 Hypertensive chronic kidney disease with stage 1 through stage 4 chronic kidney disease, or unspecified chronic kidney disease: Secondary | ICD-10-CM | POA: Diagnosis not present

## 2021-06-24 DIAGNOSIS — E1122 Type 2 diabetes mellitus with diabetic chronic kidney disease: Secondary | ICD-10-CM | POA: Diagnosis not present

## 2021-06-24 DIAGNOSIS — J189 Pneumonia, unspecified organism: Secondary | ICD-10-CM

## 2021-06-24 DIAGNOSIS — Z8673 Personal history of transient ischemic attack (TIA), and cerebral infarction without residual deficits: Secondary | ICD-10-CM | POA: Diagnosis not present

## 2021-06-24 DIAGNOSIS — E1169 Type 2 diabetes mellitus with other specified complication: Secondary | ICD-10-CM | POA: Diagnosis not present

## 2021-06-24 DIAGNOSIS — G40909 Epilepsy, unspecified, not intractable, without status epilepticus: Secondary | ICD-10-CM | POA: Diagnosis not present

## 2021-06-24 DIAGNOSIS — H409 Unspecified glaucoma: Secondary | ICD-10-CM | POA: Diagnosis not present

## 2021-06-24 DIAGNOSIS — D631 Anemia in chronic kidney disease: Secondary | ICD-10-CM

## 2021-06-24 DIAGNOSIS — E1149 Type 2 diabetes mellitus with other diabetic neurological complication: Secondary | ICD-10-CM | POA: Diagnosis not present

## 2021-06-24 DIAGNOSIS — F0150 Vascular dementia without behavioral disturbance: Secondary | ICD-10-CM | POA: Diagnosis not present

## 2021-06-24 DIAGNOSIS — N183 Chronic kidney disease, stage 3 unspecified: Secondary | ICD-10-CM

## 2021-06-24 DIAGNOSIS — F339 Major depressive disorder, recurrent, unspecified: Secondary | ICD-10-CM | POA: Diagnosis not present

## 2021-06-24 DIAGNOSIS — B370 Candidal stomatitis: Secondary | ICD-10-CM

## 2021-06-24 DIAGNOSIS — E43 Unspecified severe protein-calorie malnutrition: Secondary | ICD-10-CM

## 2021-06-24 DIAGNOSIS — E785 Hyperlipidemia, unspecified: Secondary | ICD-10-CM

## 2021-06-24 DIAGNOSIS — M792 Neuralgia and neuritis, unspecified: Secondary | ICD-10-CM

## 2021-06-24 NOTE — Progress Notes (Unsigned)
Location:  [place] Room Number: 125-P Place of Service:  SNF (31)   CODE STATUS: DNR  Allergies  Allergen Reactions   Chloral Hydrate Other (See Comments)    unknown    Chief Complaint  Patient presents with   Hospitalization Follow-up    HPI:  She is a 88 year old woman who has been hospitalized from 06-13-21 through 06-21-21. Her past medical history includes: hypertension; diabetes; MGUS; CKD; chronic hypoxic respiratory failure on home 02. She presented to the ED: a couple of weeks with poor po intake; increased weakness. She was less interactive as well. She has had a cough and congestion over the past week.  Septic shock: upon admission <36 degree C; rr>20. Was hypotensive not responsive to fluid resuscitation.  Multifocal pneumonia/right mainstem obstruction/CAP; has completed abt; will need to wean off steroids Acute on chronic hypoxic respiratory failure: on home 02 at 2liters Oropharyngeal candidiasis: will need to complete diflucan.  She is here for short term rehab. At this time the goal to return home is not definitive. She will continue to be followed for her chronic illnesses including:   History of CVA: . 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:  Past Medical History:  Diagnosis Date   Asthma    Breast cancer ([place]) 02/20/83   LT mastectomy   Bronchitis    Dementia ([place])    Diabetes mellitus ([place])    Diverticulitis 2009   Glaucoma    HTN (hypertension)    OA (osteoarthritis)    Obesity 08/17/2014   Parkinson disease ([place])    Stroke [facility])    Thyroid nodule    Dr Legrand Rams    Past Surgical History:  Procedure Laterality Date    bilateral catracts     ABDOMINAL HYSTERECTOMY     complete   APPENDECTOMY     EUS  09/10/2011   Procedure: UPPER ENDOSCOPIC ULTRASOUND (EUS) RADIAL;  Surgeon: Arta Silence, MD;  Location: [facility] END[facility]COPY;   Service: Endoscopy;  Laterality: N/A;  [place]ina/ebp   FINE NEEDLE ASPIRATION  09/10/2011   Procedure: FINE NEEDLE ASPIRATION (FNA) LINEAR;  Surgeon: Arta Silence, MD;  Location: [facility] END[facility];  Service: Endoscopy;  Laterality: N/A;   FRACTURE SURGERY  2008    right and left femurs   JOINT REPLACEMENT  1998/1999   knee boths   KNEE SURGERY Right    TKA   KNEE SURGERY Left    TKA   KNEE SURGERY Right    Periprosthetic fracture /otif   MASTECTOMY  02/20/1983   left    PARS PLANA VITRECTOMY Right 06/06/2016   Procedure: VITRECTOMY WITH CULTURES AND INJECTION OF ANTIBIOTICS; ANTERIOR CHAMBER WASHOUT;  Surgeon: Jalene Mullet, MD;  Location: [place];  Service: Ophthalmology;  Laterality: Right;    Social History   Socioeconomic History   Marital status: Widowed    Spouse name: Not on file   Number of children: 6   Years of education: 12   Highest education level: Not on file  Occupational History   Occupation: retired, CNA [employer]  Tobacco Use   Smoking status: Never   Smokeless tobacco: Never  Substance and Sexual Activity   Alcohol use: No   Drug use: No   Sexual activity: Never    Birth control/protection: Surgical  Other Topics Concern   Not on file  Social History Narrative   Lives w/ daughter Carolynn Comment).  Retired   Right handed.   Education high school   Caffeine None            Social Determinants of Health   Financial Resource Strain: Not on file  Food Insecurity: Not on file  Transportation Needs: Not on file  Physical Activity: Not on file  Stress: Not on file  Social Connections: Not on file  Intimate Partner Violence: Not on file   Family History  Problem Relation Age of Onset   Huntington's disease Mother    Stroke Father    Colon polyps Daughter 38   Throat cancer Son       VITAL SIGNS BP (!) 166/68   Pulse 72   Temp (!) 97.3 F (36.3 C)   Resp 20   Ht '5\' 4"'$  (1.626 m)   Wt 232 lb 3.2 oz (105.3 kg)   SpO2 96%   BMI 39.86 kg/m    Outpatient Encounter Medications as of 06/24/2021  Medication Sig   acetaminophen (TYLENOL) 500 MG tablet Take 500 mg by mouth in the morning and at bedtime.   albuterol (PROVENTIL HFA;VENTOLIN HFA) 108 (90 Base) MCG/ACT inhaler Inhale 2 puffs into the lungs every 6 (six) hours as needed for wheezing or shortness of breath.   ALPRAZolam (XANAX) 0.25 MG tablet Take 1 tablet (0.25 mg total) by mouth 3 (three) times daily as needed for anxiety or sleep.   Calcium Carbonate Antacid (TUMS PO) Take 2 tablets by mouth daily as needed (acid reflux).   Calcium Carbonate-Vitamin D 600-200 MG-UNIT TABS Take 1 tablet by mouth every evening.   clopidogrel (PLAVIX) 75 MG tablet Take 1 tablet (75 mg total) by mouth daily with breakfast.   dextromethorphan-guaiFENesin (MUCINEX DM) 30-600 MG 12hr tablet Take 1 tablet by mouth 2 (two) times daily for 5 days.   donepezil (ARICEPT) 10 MG tablet Take 1 tablet (10 mg total) by mouth at bedtime.   feeding supplement (ENSURE ENLIVE / ENSURE PLUS) LIQD Take 237 mLs by mouth 2 (two) times daily between meals.   ferrous sulfate 325 (65 FE) MG tablet Take 325 mg by mouth at bedtime.   fluconazole (DIFLUCAN) 100 MG tablet Take 1 tablet (100 mg total) by mouth daily for 6 days.   FLUoxetine (PROZAC) 40 MG capsule Take 40 mg by mouth daily.   folic acid (FOLVITE) 1 MG tablet Take 1 tablet (1 mg total) by mouth daily.   gabapentin (NEURONTIN) 300 MG capsule Take 1 capsule (300 mg total) by mouth 3 (three) times daily.   ipratropium-albuterol (DUONEB) 0.5-2.5 (3) MG/3ML SOLN Take 3 mLs by nebulization 2 (two) times daily. And every 6 hours as needed   latanoprost (XALATAN) 0.005 % ophthalmic solution Place 1 drop into both eyes at bedtime.   levETIRAcetam (KEPPRA) 250 MG tablet Take 1 tablet (250 mg total) by mouth daily.   lovastatin (MEVACOR) 10 MG tablet Take 1 tablet (10 mg total) by mouth at bedtime.   meclizine (ANTIVERT) 12.5 MG tablet Take 1 tablet (12.5 mg total) by  mouth 3 (three) times daily as needed for dizziness.   OXYGEN Inhale 2 L/min into the lungs continuous. Titrate as needed to maintain satuation > 90%   predniSONE (DELTASONE) 10 MG tablet Take 30 mg by mouth daily.   timolol (TIMOPTIC) 0.5 % ophthalmic solution Place 1 drop into the left eye daily.   glipiZIDE (GLUCOTROL XL) 2.5 MG 24 hr tablet Take 2.5 mg by mouth daily. (Patient not taking: Reported on 06/24/2021)   [  DISCONTINUED] predniSONE (DELTASONE) 10 MG tablet 40 mg x 2 days then '30mg'$  x 2 days then '20mg'$  x 2 days then '10mg'$  x 2 days   No facility-administered encounter medications on file as of 06/24/2021.     SIGNIFICANT DIAGNOSTIC EXAMS  TODAY  06-13-21: chest x-ray 1. New right upper lobe collapse with associated volume loss and mild rightward mediastinal shift. 2. The left lung is clear.  06-13-21: ct of head:  Atrophy with small vessel chronic ischemic changes of deep cerebral white matter. No acute intracranial abnormalities  06-13-21: ct of chest abdomen and pelvis:  Chest: 1. Distal right mainstem bronchus and portions of the proximal right upper lobe segmental airway opacification with postobstructive atelectasis greatest within the posterior segment of the right upper lobe. This may be secondary to mucosal opacification. There are airspace opacities concerning for pneumonia within the right upper and lower lobes. Small right pleural effusion. It is difficult to exclude the presence of underlying solid pulmonary nodules. Consider follow-up PA and lateral radiographs 6 weeks after treatment to ensure resolution of the right lung findings. 2. Mild cardiomegaly. Abdomen/pelvis: 1. High-grade distal and more moderate proximal diffuse colonic diverticulosis without inflammatory changes to indicate acute diverticulitis. 2. Tiny dependent gallstone. 3. Grade 1 anterolisthesis of L4 on L5, degenerative in etiology without a pars defect visualized. Aortic Atherosclerosis   LABS  REVIEWED TODAY  06-13-21; wbc 11.0; hgb 10.4; hct 37.6; mcv 107.1; plt 575; glucose 71; bun 39; creat 1.88; k+ 3.6; na++ 147; ca 9.1; gfr 25; protein 6.0; albumin 2.3; ammonia 36; hgb a1c 5.6; blood culture: no growth 06-16-21: wbc 12.8; hgb 8.5; hct 29.8; mcv 101.4 plt 467; glucose 207; bun 24; creat 1.26; k+ 3.6; na++ 149; ca 9.1; gfr 40; protein 5.5; albumin 2.4 06-18-21: chol 142; lsl 65; trig 120; hdl 53 06-20-21; wbc 15.2; hgb 9.1; hct 30.1; mcv 98.4 plt 274; glucose 102; bun 17; creat 1.23; k+ 4.1; na++ 140; ca 8.4; gfr 41; protein 4.8; albumin 2.4   Review of Systems  Unable to perform ROS: Dementia   Physical Exam Constitutional:      General: She is not in acute distress.    Appearance: She is well-developed. She is obese. She is not diaphoretic.  Neck:     Thyroid: No thyromegaly.  Cardiovascular:     Rate and Rhythm: Normal rate and regular rhythm.     Pulses: Normal pulses.     Heart sounds: Normal heart sounds.  Pulmonary:     Effort: Pulmonary effort is normal. No respiratory distress.     Breath sounds: Rhonchi present.     Comments: 02 Abdominal:     General: Bowel sounds are normal. There is no distension.     Palpations: Abdomen is soft.     Tenderness: There is no abdominal tenderness.  Musculoskeletal:        General: Normal range of motion.     Cervical back: Neck supple.     Right lower leg: No edema.     Left lower leg: No edema.  Lymphadenopathy:     Cervical: No cervical adenopathy.  Skin:    General: Skin is warm and dry.  Neurological:     Mental Status: She is alert. Mental status is at baseline.  Psychiatric:        Mood and Affect: Mood normal.     ASSESSMENT/ PLAN:  Acute on chronic respiratory failure with hypoxia/multifocal pneumonia: is 02 dependent has completed abt. Has abluterol 2 puffs every  6 hours as needed duoneb twice daily and every 6 hours as needed. Mucinex twice daily for 5 days. Will complete prednisone taper   2.  Oropharyngeal candidiasis: will complete diflucan 100 mg daily for 6 days    3. History of CVA: is stable will continue plavix 75 mg daily   4. Glaucoma of both eyes unspecified type: will continue xalatan to both eyes; timoptic left eye  5. Depression recurrent: is on prozac 40 mg daily and has xanxa 0.25 mg twice daily as needed for 14 days  6. Seizure disorder: will continue keppra 250 mg daily   7. 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 Hyperlipidemia associated with type 2 diabetes mellitus: LDL 65 will stop mevacor will monitor  9. Benign hypertension with CKD (chronic kidney disease) stage III; b/p 166/68: will continue cozaar 100 mg daily   10. CKD stage 3 due to type 2 diabetes mellitus: bun 17; creat 1.23; gfr 41  11. Vascular dementia without behavioral disturbance: weight is 234 pounds will continue aricept 10 mg daily   12. Severe protein calorie malnutrition: protein 4.8; albumin 2.4 will continue ensure twice daily   13. Anemia associated with stage 3 chronic kidney disease: hgb 9.1 will continue iron daily   14. Neuropathic pain: will continue tylenol 500 mg twice daily gabapentin 300 mg three times daily    Ok Edwards NP [facility]   [phone]

## 2021-06-25 ENCOUNTER — Non-Acute Institutional Stay (SKILLED_NURSING_FACILITY): Payer: Medicare Other | Admitting: Internal Medicine

## 2021-06-25 ENCOUNTER — Encounter: Payer: Self-pay | Admitting: Internal Medicine

## 2021-06-25 DIAGNOSIS — J189 Pneumonia, unspecified organism: Secondary | ICD-10-CM

## 2021-06-25 DIAGNOSIS — I10 Essential (primary) hypertension: Secondary | ICD-10-CM | POA: Diagnosis not present

## 2021-06-25 DIAGNOSIS — E43 Unspecified severe protein-calorie malnutrition: Secondary | ICD-10-CM | POA: Insufficient documentation

## 2021-06-25 DIAGNOSIS — E1149 Type 2 diabetes mellitus with other diabetic neurological complication: Secondary | ICD-10-CM

## 2021-06-25 DIAGNOSIS — N1832 Chronic kidney disease, stage 3b: Secondary | ICD-10-CM

## 2021-06-25 DIAGNOSIS — D631 Anemia in chronic kidney disease: Secondary | ICD-10-CM | POA: Insufficient documentation

## 2021-06-25 DIAGNOSIS — E1169 Type 2 diabetes mellitus with other specified complication: Secondary | ICD-10-CM | POA: Insufficient documentation

## 2021-06-25 DIAGNOSIS — F339 Major depressive disorder, recurrent, unspecified: Secondary | ICD-10-CM | POA: Insufficient documentation

## 2021-06-25 DIAGNOSIS — I129 Hypertensive chronic kidney disease with stage 1 through stage 4 chronic kidney disease, or unspecified chronic kidney disease: Secondary | ICD-10-CM | POA: Insufficient documentation

## 2021-06-25 DIAGNOSIS — E1122 Type 2 diabetes mellitus with diabetic chronic kidney disease: Secondary | ICD-10-CM | POA: Insufficient documentation

## 2021-06-25 DIAGNOSIS — F0150 Vascular dementia without behavioral disturbance: Secondary | ICD-10-CM | POA: Insufficient documentation

## 2021-06-25 DIAGNOSIS — M792 Neuralgia and neuritis, unspecified: Secondary | ICD-10-CM | POA: Insufficient documentation

## 2021-06-25 NOTE — Assessment & Plan Note (Signed)
Diabetic control is excellent as manifested by an A1c of 5.6%.  Sulfonylurea discontinued.

## 2021-06-25 NOTE — Patient Instructions (Signed)
See assessment and plan under each diagnosis in the problem list and acutely for this visit 

## 2021-06-25 NOTE — Assessment & Plan Note (Addendum)
Follow-up imaging following rehab recommended.  Apparently bronchoscopy to R/O airway obstruction was not pursued because of advanced age, multiple comorbidities, and dementia.

## 2021-06-25 NOTE — Progress Notes (Signed)
NURSING HOME LOCATION: [facility] ROOM NUMBER:  125 P  CODE STATUS:  DNR  PCP:  Rosita Fire MD  This is a comprehensive admission note to this SNFperformed on this date less than 30 days from date of admission. Included are preadmission medical/surgical history; reconciled medication list; family history; social history and comprehensive review of systems.  Corrections and additions to the records were documented. Comprehensive physical exam was also performed. Additionally a clinical summary was entered for each active diagnosis pertinent to this admission in the Problem List to enhance continuity of care.  HPI: She was hospitalized 6/8 - 06/21/2021 with multifocal pneumonia in the context of a history of chronic oxygen dependent respiratory failure and history of asthma.  History was provided by the granddaughter; anorexia  had been present for "a couple of weeks".  During that time she had become more immobile and somnolent.  Cough and congestion were present beginning the week PTA. She presented to the ED with tachypnea and hypotension not responsive to fluid resuscitation.  Portable chest x-ray revealed right upper lobe collapse with associated volume loss and mild rightward mediastinal shift.  CT without contrast revealed distal right mainstem and proximal right upper lobe obstruction and multilobar pneumonia.   Aggressive antibiotic therapy and steroid therapy was initiated along with pulmonary toilet.Follow-up with repeat imaging was recommended after clinical resolution. Course was complicated by oropharyngeal candidiasis for which she is to receive Diflucan for total of 14 days. Additionally she had acute diarrhea; stool culture was negative as was C. difficile. Home blood pressure medicines were held because of soft blood pressures. SLP recommended dysphagia diet. AKI was present on admission with creatinine of 1.88 and GFR of 25 indicating stage IV renal disease.   Baseline CKD is felt to be stage IIIb.  At discharge creatinine was 1.28 with GFR of 39.  Total protein was 5.3 and albumin 2.7 indicating protein/malnutrition.  Diabetes control was excellent as documented by an A1c of 5.6%.  Electrolyte and chemistry imbalances included hypokalemia, hypomagnesemia, hypophosphatemia, and hypernatremia.  All were repleted all resolved.  At discharge she exhibited minimally hypochromic (MCHC 29.9) anemia with H/H of 9.2/30.8.  H/H at admission was 10.4/37.6 with macrocytosis (MCV of 107.1).  White count on admission was 11,000 with minimal left shift. White blood count at discharge was 17,500 with significant left shift.  As noted this is in the context of steroid therapy.  Clinically she had signs and symptoms consistent with Parkinson's and was to follow-up post discharge from the SNF with Dr. Carles Collet.  By history she had not seen a Neurologist for 4-5 years PTA. She was discharged to the SNF for rehab.  Past medical and surgical history: Includes history of asthma, GERD, monoclonal gammopathy of unknown significance, history of breast cancer, diverticulosis, essential hypertension, Parkinson's, history of stroke, and diabetes with neurovascular complications. Surgeries and procedures include abdominal hysterectomy, bilateral TKA, and left mastectomy.  Social history: Nondrinker; non-smoker.  Family history: Reviewed; noncontributory due to advanced age.   Review of systems: Clinical neurocognitive deficits made validity of responses questionable . Date given as "May 21, 1919."  She then corrected it to "1920.".  When asked why she had been in the hospital she stated "Parkies", meaning Parkinson's.  When I asked if that is why she had actually been hospitalized ,she then answered "pneumonia".   Review of systems is generally positive includes fatigue.  She states that her breathing was "bad this morning".  She also describes  headache and "stomach hurting" this morning.   She has frequent dizziness which she relates to "inner ear".  She also states that she stays cold.  She validated that "I am forgetful."  Constitutional: No fever, significant weight change  Eyes: No redness, discharge, pain, vision change ENT/mouth: No nasal congestion, purulent discharge, earache, change in hearing, sore throat  Cardiovascular: No chest pain, palpitations, paroxysmal nocturnal dyspnea, claudication, edema  Respiratory: No sputum production, hemoptysis, significant snoring, apnea Gastrointestinal: No heartburn, nausea /vomiting, rectal bleeding, melena, change in bowels Genitourinary: No dysuria, hematuria, pyuria, incontinence, nocturia Musculoskeletal: No joint stiffness, joint swelling, weakness, pain Dermatologic: No rash, pruritus, change in appearance of skin Neurologic: No  syncope, seizures, numbness, tingling Psychiatric: No significant anxiety, depression, insomnia, anorexia Endocrine: No change in hair/skin/nails, excessive thirst, excessive hunger, excessive urination  Hematologic/lymphatic: No significant bruising, lymphadenopathy, abnormal bleeding Allergy/immunology: No itchy/watery eyes, significant sneezing, urticaria, angioedema  Physical exam:  Pertinent or positive findings: Eyebrows are decreased laterally.  She has ptosis greater on the right than the left.  The right lens is opacified by gray pigmentation.  She is wearing nasal oxygen.  She is edentulous.  She exhibits a halting speech pattern with slight hyponasal character.  As she talks her head bobs up and down.  Breath sounds are decreased anteriorly, greater on the right.  Minor rales are noted.  She has intermittent grade 1 systolic murmur.  Heart rhythm is irregular.  Abdomen is protuberant.  Pedal pulses are not palpable.  She has 1/2+ edema at the sock line.  She has irregular hyperpigmentation over the shins, much more striking on the right than the left.  She exhibits intermittent flexion tremor  of her toes bilaterally.  General appearance: Adequately nourished; no acute distress, increased work of breathing is present.   Lymphatic: No lymphadenopathy about the head, neck, axilla. Eyes: No conjunctival inflammation or lid edema is present. There is no scleral icterus. Ears:  External ear exam shows no significant lesions or deformities.   Nose:  External nasal examination shows no deformity or inflammation. Nasal mucosa are pink and moist without lesions, exudates Oral exam: Lips and gums are healthy appearing.There is no oropharyngeal erythema or exudate. Neck:  No thyromegaly, masses, tenderness noted.    Heart:  No gallop, click, rub.  Lungs:  without wheezes, rhonchi, rubs. Abdomen: Bowel sounds are normal.  Abdomen is soft and nontender with no organomegaly, hernias, masses. GU: Deferred  Extremities:  No cyanosis, clubbing Neurologic exam:  Balance, Rhomberg, finger to nose testing could not be completed due to clinical state Skin: Warm & dry w/o tenting.  See clinical summary under each active problem in the Problem List with associated updated therapeutic plan

## 2021-06-25 NOTE — Assessment & Plan Note (Signed)
Today's blood pressure is mildly elevated; home meds were held during hospitalization for pneumonia because of soft blood pressures.  These will be reinitiated if there is sustained hypertension.

## 2021-06-25 NOTE — Assessment & Plan Note (Addendum)
Hospitalized 6/8 - 06/21/2021 with right upper lobe obstruction and multifocal pneumonia.  Course complicated by AKI with stage IV renal disease.  Post aggressive intervention creatinine 1.28 with GFR of 39 indicating CKD stage IIIb.  No change in present medicines unless there is progression of CKD. Maximum daily Gabapentin dose with CKD 3b  Is 1400 mg

## 2021-06-28 ENCOUNTER — Other Ambulatory Visit: Payer: Self-pay | Admitting: Adult Health

## 2021-06-28 MED ORDER — ALPRAZOLAM 0.25 MG PO TABS
0.2500 mg | ORAL_TABLET | Freq: Three times a day (TID) | ORAL | 0 refills | Status: DC | PRN
Start: 2021-06-28 — End: 2021-07-08

## 2021-07-04 ENCOUNTER — Non-Acute Institutional Stay (SKILLED_NURSING_FACILITY): Payer: Medicare Other | Admitting: Adult Health

## 2021-07-04 ENCOUNTER — Encounter: Payer: Self-pay | Admitting: Adult Health

## 2021-07-04 DIAGNOSIS — N183 Chronic kidney disease, stage 3 unspecified: Secondary | ICD-10-CM

## 2021-07-04 DIAGNOSIS — J431 Panlobular emphysema: Secondary | ICD-10-CM | POA: Diagnosis not present

## 2021-07-04 DIAGNOSIS — E1149 Type 2 diabetes mellitus with other diabetic neurological complication: Secondary | ICD-10-CM

## 2021-07-04 DIAGNOSIS — I129 Hypertensive chronic kidney disease with stage 1 through stage 4 chronic kidney disease, or unspecified chronic kidney disease: Secondary | ICD-10-CM

## 2021-07-04 NOTE — Progress Notes (Signed)
Location:  [facility] Room Number: 125/P Place of Service:  SNF (31)   CODE STATUS: DNR  Allergies  Allergen Reactions   Chloral Hydrate Other (See Comments)    unknown    Chief Complaint  Patient presents with   Acute Visit    Care plan meeting     HPI:  We have come together for her care plan meeting. BIMS 13/15 mood 12/30: indicative of depression; decreased energy; trouble concentrating. She remains in bed no falls. She requires extensive assist to dependent for her adls. She is frequently incontinent of bladder and bowel. Dietary: weight is 229.6 pounds; D-2 diet. Therapy: bed max assist; transfers max assist; can ambulate 8-12 feet with rolling walker and min assist. 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   Past Medical History:  Diagnosis Date   Asthma    Breast cancer ([place]) 02/20/83   LT mastectomy   Bronchitis    Dementia ([facility])    Diabetes mellitus ([place])    Diverticulitis 2009   Glaucoma    HTN (hypertension)    OA (osteoarthritis)    Obesity 08/17/2014   Parkinson disease ([place])    Stroke [facility])    Thyroid nodule    Dr Legrand Rams    Past Surgical History:  Procedure Laterality Date    bilateral catracts     ABDOMINAL HYSTERECTOMY     complete   APPENDECTOMY     EUS  09/10/2011   Procedure: UPPER ENDOSCOPIC ULTRASOUND (EUS) RADIAL;  Surgeon: Arta Silence, MD;  Location: [facility] END[facility];  Service: Endoscopy;  Laterality: N/A;  christina/ebp   FINE NEEDLE ASPIRATION  09/10/2011   Procedure: FINE NEEDLE ASPIRATION (FNA) LINEAR;  Surgeon: Arta Silence, MD;  Location: [facility] ENDOSCOPY;  Service: Endoscopy;  Laterality: N/A;   FRACTURE SURGERY  2008    right and left femurs   JOINT REPLACEMENT  1998/1999   knee boths   KNEE SURGERY Right    TKA   KNEE SURGERY Left    TKA    KNEE SURGERY Right    Periprosthetic fracture /otif   MASTECTOMY  02/20/1983   left    PARS PLANA VITRECTOMY Right 06/06/2016   Procedure: VITRECTOMY WITH CULTURES AND INJECTION OF ANTIBIOTICS; ANTERIOR CHAMBER WASHOUT;  Surgeon: Jalene Mullet, MD;  Location: [place];  Service: Ophthalmology;  Laterality: Right;    Social History   Socioeconomic History   Marital status: Widowed    Spouse name: Not on file   Number of children: 6   Years of education: 48   Highest education level: Not on file  Occupational History   Occupation: retired, 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   Retired   Right handed.   Education high school   Caffeine None            Social Determinants of Health   Financial Resource Strain: Not on file  Food Insecurity: Not on file  Transportation Needs: Not on file  Physical  Activity: Not on file  Stress: Not on file  Social Connections: Not on file  Intimate Partner Violence: Not on file   Family History  Problem Relation Age of Onset   Huntington's disease Mother    Stroke Father    Colon polyps Daughter 76   Throat cancer Son       VITAL SIGNS BP (!) 154/65   Pulse 75   Temp (!) 97.2 F (36.2 C)   Resp (!) 22   Ht '5\' 4"'$  (1.626 m)   Wt 229 lb 9.6 oz (104.1 kg)   SpO2 100%   BMI 39.41 kg/m   Outpatient Encounter Medications as of 07/04/2021  Medication Sig   acetaminophen (TYLENOL) 500 MG tablet Take 500 mg by mouth 2 (two) times daily as needed (Pain).   albuterol (PROVENTIL HFA;VENTOLIN HFA) 108 (90 Base) MCG/ACT inhaler Inhale 2 puffs into the lungs every 6 (six) hours as needed for wheezing or shortness of breath.   ALPRAZolam  (XANAX) 0.25 MG tablet Take 1 tablet (0.25 mg total) by mouth 3 (three) times daily as needed for anxiety or sleep.   Balsam [place]-Castor Oil (VENELEX) OINT Apply topically 3 (three) times daily.  Apply to sacrum and bilateral buttocks and coccyx qshift for prevention   Calcium Carbonate Antacid (TUMS PO) Take 2 tablets by mouth daily as needed (acid reflux).   Calcium Carbonate-Vitamin D 600-200 MG-UNIT TABS Take 1 tablet by mouth every evening.   clopidogrel (PLAVIX) 75 MG tablet Take 1 tablet (75 mg total) by mouth daily with breakfast.   donepezil (ARICEPT) 10 MG tablet Take 1 tablet (10 mg total) by mouth at bedtime.   feeding supplement (ENSURE ENLIVE / ENSURE PLUS) LIQD Take 237 mLs by mouth 2 (two) times daily between meals.   ferrous sulfate 325 (65 FE) MG tablet Take 325 mg by mouth at bedtime.   FLUoxetine (PROZAC) 40 MG capsule Take 40 mg by mouth daily.   folic acid (FOLVITE) 1 MG tablet Take 1 tablet (1 mg total) by mouth daily.   gabapentin (NEURONTIN) 300 MG capsule Take 1 capsule (300 mg total) by mouth 3 (three) times daily.   ipratropium-albuterol (DUONEB) 0.5-2.5 (3) MG/3ML SOLN Take 3 mLs by nebulization 2 (two) times daily. And every 6 hours as needed   latanoprost (XALATAN) 0.005 % ophthalmic solution Place 1 drop into both eyes at bedtime.   levETIRAcetam (KEPPRA) 250 MG tablet Take 1 tablet (250 mg total) by mouth daily.   lovastatin (MEVACOR) 10 MG tablet Take 1 tablet (10 mg total) by mouth at bedtime.   meclizine (ANTIVERT) 12.5 MG tablet Take 1 tablet (12.5 mg total) by mouth 3 (three) times daily as needed for dizziness.   OXYGEN Inhale 2 L/min into the lungs continuous. Titrate as needed to maintain satuation > 90%   timolol (TIMOPTIC) 0.5 % ophthalmic solution Place 1 drop into the left eye daily.   [DISCONTINUED] glipiZIDE (GLUCOTROL XL) 2.5 MG 24 hr tablet Take 2.5 mg by mouth daily. (Patient not taking: Reported on 06/24/2021)   No facility-administered encounter  medications on file as of 07/04/2021.     SIGNIFICANT DIAGNOSTIC EXAMS  PREVIOUS   06-13-21: chest x-ray 1. New right upper lobe collapse with associated volume loss and mild rightward mediastinal shift. 2. The left lung is clear.  06-13-21: ct of head:  Atrophy with small vessel chronic ischemic changes of deep cerebral white matter. No acute intracranial abnormalities  06-13-21: ct of chest abdomen and pelvis:  Chest: 1. Distal right mainstem bronchus and portions of the proximal right upper lobe segmental airway opacification with postobstructive atelectasis greatest within the posterior segment of the right upper lobe. This may be secondary to mucosal opacification. There are airspace opacities concerning for pneumonia within the right upper and lower lobes. Small right pleural effusion. It is difficult to exclude the presence of underlying solid pulmonary nodules. Consider follow-up PA and lateral radiographs 6 weeks after treatment to ensure resolution of the right lung findings. 2. Mild cardiomegaly. Abdomen/pelvis: 1. High-grade distal and more moderate proximal diffuse colonic diverticulosis without inflammatory changes to indicate acute diverticulitis. 2. Tiny dependent gallstone. 3. Grade 1 anterolisthesis of L4 on L5, degenerative in etiology without a pars defect visualized. Aortic Atherosclerosis   NO NEW EXAMS   LABS REVIEWED PREVIOUS  06-13-21; wbc 11.0; hgb 10.4; hct 37.6; mcv 107.1; plt 575; glucose 71; bun 39; creat 1.88; k+ 3.6; na++ 147; ca 9.1; gfr 25; protein 6.0; albumin 2.3; ammonia 36; hgb a1c 5.6; blood culture: no growth 06-16-21: wbc 12.8; hgb 8.5; hct 29.8; mcv 101.4 plt 467; glucose 207; bun 24; creat 1.26; k+ 3.6; na++ 149; ca 9.1; gfr 40; protein 5.5; albumin 2.4 06-18-21: chol 142; lsl 65; trig 120; hdl 53 06-20-21; wbc 15.2; hgb 9.1; hct 30.1; mcv 98.4 plt 274; glucose 102; bun 17; creat 1.23; k+ 4.1; na++ 140; ca 8.4; gfr 41; protein 4.8; albumin 2.4   NO  NEW LABS.   Review of Systems  Unable to perform ROS: Dementia   Physical Exam Constitutional:      General: She is not in acute distress.    Appearance: She is well-developed. She is obese. She is not diaphoretic.  Neck:     Thyroid: No thyromegaly.  Cardiovascular:     Rate and Rhythm: Normal rate and regular rhythm.     Pulses: Normal pulses.     Heart sounds: Normal heart sounds.  Pulmonary:     Effort: Pulmonary effort is normal. No respiratory distress.     Breath sounds: Normal breath sounds.  Abdominal:     General: Bowel sounds are normal. There is no distension.     Palpations: Abdomen is soft.     Tenderness: There is no abdominal tenderness.     Comments: 02  Musculoskeletal:        General: Normal range of motion.     Cervical back: Neck supple.     Right lower leg: No edema.     Left lower leg: No edema.  Lymphadenopathy:     Cervical: No cervical adenopathy.  Skin:    General: Skin is warm and dry.  Neurological:     Mental Status: She is alert. Mental status is at baseline.  Psychiatric:        Mood and Affect: Mood normal.       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   Time spent iwht patient: 40 minutes: plan of care; medications; dietary.    Ok Edwards NP [facility]  call [phone]

## 2021-07-05 ENCOUNTER — Encounter (HOSPITAL_COMMUNITY): Payer: Self-pay

## 2021-07-05 ENCOUNTER — Emergency Department (HOSPITAL_COMMUNITY): Payer: Medicare Other

## 2021-07-05 ENCOUNTER — Emergency Department (HOSPITAL_COMMUNITY)
Admission: EM | Admit: 2021-07-05 | Discharge: 2021-07-06 | Disposition: A | Discharge status: Home / Self Care | Payer: Medicare Other | Attending: Emergency Medicine | Admitting: Emergency Medicine

## 2021-07-05 ENCOUNTER — Other Ambulatory Visit: Payer: Self-pay

## 2021-07-05 DIAGNOSIS — X500XXA Overexertion from strenuous movement or load, initial encounter: Secondary | ICD-10-CM | POA: Diagnosis not present

## 2021-07-05 DIAGNOSIS — S9305XA Dislocation of left ankle joint, initial encounter: Secondary | ICD-10-CM | POA: Diagnosis not present

## 2021-07-05 DIAGNOSIS — S8252XA Displaced fracture of medial malleolus of left tibia, initial encounter for closed fracture: Secondary | ICD-10-CM | POA: Insufficient documentation

## 2021-07-05 DIAGNOSIS — S99912A Unspecified injury of left ankle, initial encounter: Secondary | ICD-10-CM | POA: Diagnosis present

## 2021-07-05 DIAGNOSIS — S82842A Displaced bimalleolar fracture of left lower leg, initial encounter for closed fracture: Secondary | ICD-10-CM | POA: Diagnosis not present

## 2021-07-05 DIAGNOSIS — S82392A Other fracture of lower end of left tibia, initial encounter for closed fracture: Secondary | ICD-10-CM | POA: Diagnosis not present

## 2021-07-05 DIAGNOSIS — Z7901 Long term (current) use of anticoagulants: Secondary | ICD-10-CM | POA: Diagnosis not present

## 2021-07-05 DIAGNOSIS — S82832A Other fracture of upper and lower end of left fibula, initial encounter for closed fracture: Secondary | ICD-10-CM | POA: Diagnosis not present

## 2021-07-05 DIAGNOSIS — M7732 Calcaneal spur, left foot: Secondary | ICD-10-CM | POA: Diagnosis not present

## 2021-07-05 DIAGNOSIS — S82892A Other fracture of left lower leg, initial encounter for closed fracture: Secondary | ICD-10-CM

## 2021-07-05 NOTE — ED Triage Notes (Signed)
Pt brought in by [facility] center staff via stretcher for Trimalleolar fracture per [facility] RN report, reports pt was transferring to toilet today and did not fall, but somehow hurt ankle during transport, xray was done, results showed fx to left ankle.

## 2021-07-05 NOTE — ED Provider Notes (Signed)
[facility] EMERGENCY DEPARTMENT Provider Note   CSN: [id number] Arrival date & time: 07/05/21  2200     History  Chief Complaint  Patient presents with   Ankle Injury    Trimalleolar fracture per [facility] RN report    Tiffany Hartman is a 88 y.o. female.  Patient referred to the emergency department from [facility] for ankle injury.  Patient reports that she was being transferred to the toilet on a [place] lift and somehow got her foot and ankle caught in the left.  She reports sudden onset of severe pain.  Nursing home reports that they performed an x-ray which showed trimalleolar fracture.       Home Medications Prior to Admission medications   Medication Sig Start Date End Date Taking? Authorizing Provider  acetaminophen (TYLENOL) 500 MG tablet Take 500 mg by mouth 2 (two) times daily as needed (Pain).    [provider]  albuterol (PROVENTIL HFA;VENTOLIN HFA) 108 (90 Base) MCG/ACT inhaler Inhale 2 puffs into the lungs every 6 (six) hours as needed for wheezing or shortness of breath. 03/01/18   Sharee Holster, NP  ALPRAZolam Prudy Feeler) 0.25 MG tablet Take 1 tablet (0.25 mg total) by mouth 3 (three) times daily as needed for anxiety or sleep. 06/28/21   Sharee Holster, NP  Lucilla Lame [place]-Castor Oil [facility]) OINT Apply topically 3 (three) times daily.  Apply to sacrum and bilateral buttocks and coccyx qshift for prevention    [provider]  Calcium Carbonate Antacid (TUMS PO) Take 2 tablets by mouth daily as needed (acid reflux).    [provider]  Calcium Carbonate-Vitamin D 600-200 MG-UNIT TABS Take 1 tablet by mouth every evening.    [provider]  clopidogrel (PLAVIX) 75 MG tablet Take 1 tablet (75 mg total) by mouth daily with breakfast. 03/01/18   Sharee Holster, NP  donepezil (ARICEPT) 10 MG tablet Take 1 tablet (10 mg total) by mouth at bedtime. 03/01/18   Sharee Holster, NP  feeding supplement (ENSURE ENLIVE / ENSURE PLUS) LIQD  Take 237 mLs by mouth 2 (two) times daily between meals. 06/21/21   Joseph Art, DO  ferrous sulfate 325 (65 FE) MG tablet Take 325 mg by mouth at bedtime.    [provider]  FLUoxetine (PROZAC) 40 MG capsule Take 40 mg by mouth daily. 05/26/21   [provider]  folic acid (FOLVITE) 1 MG tablet Take 1 tablet (1 mg total) by mouth daily. 03/01/18   Sharee Holster, NP  gabapentin (NEURONTIN) 300 MG capsule Take 1 capsule (300 mg total) by mouth 3 (three) times daily. 03/01/18   Sharee Holster, NP  ipratropium-albuterol (DUONEB) 0.5-2.5 (3) MG/3ML SOLN Take 3 mLs by nebulization 2 (two) times daily. And every 6 hours as needed 03/01/18   Sharee Holster, NP  latanoprost (XALATAN) 0.005 % ophthalmic solution Place 1 drop into both eyes at bedtime. 03/01/18   Sharee Holster, NP  levETIRAcetam (KEPPRA) 250 MG tablet Take 1 tablet (250 mg total) by mouth daily. 06/21/21   Joseph Art, DO  lovastatin (MEVACOR) 10 MG tablet Take 1 tablet (10 mg total) by mouth at bedtime. 03/01/18   Sharee Holster, NP  meclizine (ANTIVERT) 12.5 MG tablet Take 1 tablet (12.5 mg total) by mouth 3 (three) times daily as needed for dizziness. 06/21/21   Joseph Art, DO  OXYGEN Inhale 2 L/min into the lungs continuous. Titrate as needed to maintain  satuation > 90% 02/16/18   [provider]  timolol (TIMOPTIC) 0.5 % ophthalmic solution Place 1 drop into the left eye daily. 05/04/21   [provider]      Allergies    Chloral hydrate    Review of Systems   Review of Systems  Physical Exam Updated Vital Signs BP (!) 144/90   Pulse 85   Temp 98.9 F (37.2 C) (Oral)   Resp 20   Ht 5\' 4"  (1.626 m)   Wt 104.1 kg   SpO2 100%   BMI 39.41 kg/m  Physical Exam Vitals and nursing note reviewed.  Constitutional:      General: She is not in acute distress.    Appearance: She is well-developed.  HENT:     Head: Normocephalic and atraumatic.     Mouth/Throat:     Mouth:  Mucous membranes are moist.  Eyes:     General: Vision grossly intact. Gaze aligned appropriately.     Extraocular Movements: Extraocular movements intact.     Conjunctiva/sclera: Conjunctivae normal.  Cardiovascular:     Rate and Rhythm: Normal rate and regular rhythm.     Pulses: Normal pulses.     Heart sounds: Normal heart sounds, S1 normal and S2 normal. No murmur heard.    No friction rub. No gallop.  Pulmonary:     Effort: Pulmonary effort is normal. No respiratory distress.     Breath sounds: Normal breath sounds.  Abdominal:     General: Bowel sounds are normal.     Palpations: Abdomen is soft.     Tenderness: There is no abdominal tenderness. There is no guarding or rebound.     Hernia: No hernia is present.  Musculoskeletal:        General: No swelling.     Cervical back: Full passive range of motion without pain, normal range of motion and neck supple. No spinous process tenderness or muscular tenderness. Normal range of motion.     Right lower leg: No edema.     Left lower leg: No edema.     Left ankle: Swelling present. No ecchymosis. Tenderness present. No proximal fibula tenderness. Decreased range of motion.  Skin:    General: Skin is warm and dry.     Capillary Refill: Capillary refill takes less than 2 seconds.     Findings: No ecchymosis, erythema, rash or wound.  Neurological:     General: No focal deficit present.     Mental Status: She is alert and oriented to person, place, and time.     GCS: GCS eye subscore is 4. GCS verbal subscore is 5. GCS motor subscore is 6.     Cranial Nerves: Cranial nerves 2-12 are intact.     Sensory: Sensation is intact.     Motor: Motor function is intact.     Coordination: Coordination is intact.  Psychiatric:        Attention and Perception: Attention normal.        Mood and Affect: Mood normal.        Speech: Speech normal.        Behavior: Behavior normal.     ED Results / Procedures / Treatments   Labs (all labs  ordered are listed, but only abnormal results are displayed) Labs Reviewed - No data to display  EKG None  Radiology DG Ankle 2 Views Left  Result Date: 07/06/2021 CLINICAL DATA:  Status post reduction EXAM: LEFT ANKLE - 2 VIEW COMPARISON:  Films  from the previous day. FINDINGS: There is been some reduction of the distal tibial and fibular fractures with relocation of the talus with respect to the distal tibia. IMPRESSION: Status post reduction and splinting of the distal tibial and fibular fractures. Electronically Signed   By: Alcide Clever M.D.   On: 07/06/2021 01:37   DG Ankle 2 Views Left  Result Date: 07/05/2021 CLINICAL DATA:  [id number] EXAM: LEFT FOOT - 2 VIEW; LEFT ANKLE - 2 VIEW COMPARISON:  None Available. FINDINGS: Left foot: No evidence of fracture, dislocation, or joint effusion. Plantar and posterior calcaneal spurs. No evidence of severe arthropathy. No aggressive appearing focal bone abnormality. Soft tissues are unremarkable. Vascular calcifications. Left ankle: Acute laterally displaced and medial apex angulated transsyndesmotic distal fibular fracture. Acute comminuted and laterally displaced medial malleolar fracture. Associated lateral dislocation of the talus in relation to the tibial plafond. Query associated rotational component. Posterior malleolus not excluded. Diffuse subcutaneus soft tissue edema. Vascular calcifications. IMPRESSION: 1. Acute laterally displaced and medial apex angulated transsyndesmotic distal fibular fracture. 2. Acute comminuted and laterally displaced medial malleolar fracture. 3. Associated lateral dislocation of the talus in relation to the tibial plafond. Query associated rotational component. 4. No acute displaced fracture or dislocation of the bones of the foot. Electronically Signed   By: Tish Frederickson M.D.   On: 07/05/2021 23:41   DG Foot 2 Views Left  Result Date: 07/05/2021 CLINICAL DATA:  [id number] EXAM: LEFT FOOT - 2 VIEW; LEFT ANKLE - 2 VIEW  COMPARISON:  None Available. FINDINGS: Left foot: No evidence of fracture, dislocation, or joint effusion. Plantar and posterior calcaneal spurs. No evidence of severe arthropathy. No aggressive appearing focal bone abnormality. Soft tissues are unremarkable. Vascular calcifications. Left ankle: Acute laterally displaced and medial apex angulated transsyndesmotic distal fibular fracture. Acute comminuted and laterally displaced medial malleolar fracture. Associated lateral dislocation of the talus in relation to the tibial plafond. Query associated rotational component. Posterior malleolus not excluded. Diffuse subcutaneus soft tissue edema. Vascular calcifications. IMPRESSION: 1. Acute laterally displaced and medial apex angulated transsyndesmotic distal fibular fracture. 2. Acute comminuted and laterally displaced medial malleolar fracture. 3. Associated lateral dislocation of the talus in relation to the tibial plafond. Query associated rotational component. 4. No acute displaced fracture or dislocation of the bones of the foot. Electronically Signed   By: Tish Frederickson M.D.   On: 07/05/2021 23:41    Procedures .Ortho Injury Treatment  Date/Time: 07/06/2021 1:12 AM  Performed by: Gilda Crease, MD Authorized by: Gilda Crease, MD   Consent:    Consent obtained:  Verbal   Consent given by:  Patient   Risks discussed:  Nerve damage, restricted joint movement, vascular damage, stiffness, recurrent dislocation and irreducible dislocationInjury location: ankle Location details: left ankle Injury type: fracture-dislocation Pre-procedure distal perfusion: normal Pre-procedure neurological function: normal Pre-procedure range of motion: reduced  Anesthesia: Local anesthesia used: no  Patient sedated: NoManipulation performed: yes Skeletal traction used: yes Reduction successful: yes X-ray confirmed reduction: yes Immobilization: splint Splint type: ankle stirrup and short  leg Splint Applied by: ED Nurse Supplies used: Ortho-Glass Post-procedure neurovascular assessment: post-procedure neurovascularly intact Post-procedure distal perfusion: normal Post-procedure neurological function: normal Post-procedure range of motion: improved       Medications Ordered in ED Medications  morphine (PF) 4 MG/ML injection 4 mg (4 mg Intravenous Given 07/06/21 0052)  ondansetron (ZOFRAN) injection 4 mg (4 mg Intravenous Given 07/06/21 0051)    ED Course/ Medical Decision Making/ A&P  Medical Decision Making Amount and/or Complexity of Data Reviewed Radiology: ordered.  Risk Prescription drug management.   Patient presents to the emergency department for isolated ankle injury.  Injury apparently occurred when she was being transferred to the toilet at the nursing home.  X-ray confirms bimalleolar fracture with dislocation.  This was reduced successfully.  Patient reports that she sees Dr. Romeo Apple, will refer to him for definitive follow-up.  The surgeries are generally delayed at least a week to allow swelling to go down.  She does not require hospitalization at this time as she is in a skilled nursing facility.        Final Clinical Impression(s) / ED Diagnoses Final diagnoses:  Closed fracture of left ankle, initial encounter    Rx / DC Orders ED Discharge Orders     None         Jorey Dollard, Canary Brim, MD 07/06/21 0147

## 2021-07-05 NOTE — ED Notes (Signed)
ED Provider at bedside. 

## 2021-07-06 ENCOUNTER — Emergency Department (HOSPITAL_COMMUNITY): Payer: Medicare Other

## 2021-07-06 DIAGNOSIS — S82832A Other fracture of upper and lower end of left fibula, initial encounter for closed fracture: Secondary | ICD-10-CM | POA: Diagnosis not present

## 2021-07-06 DIAGNOSIS — S82392A Other fracture of lower end of left tibia, initial encounter for closed fracture: Secondary | ICD-10-CM | POA: Diagnosis not present

## 2021-07-06 MED ORDER — ONDANSETRON HCL 4 MG/2ML IJ SOLN
4.0000 mg | Freq: Once | INTRAMUSCULAR | Status: AC
  Administered 2021-07-06: 4 mg via INTRAVENOUS
  Filled 2021-07-06: qty 2

## 2021-07-06 MED ORDER — MORPHINE SULFATE (PF) 4 MG/ML IV SOLN
4.0000 mg | Freq: Once | INTRAVENOUS | Status: AC
  Administered 2021-07-06: 4 mg via INTRAVENOUS
  Filled 2021-07-06: qty 1

## 2021-07-08 ENCOUNTER — Telehealth: Payer: Self-pay | Admitting: Orthopedic Surgery

## 2021-07-08 ENCOUNTER — Encounter: Payer: Self-pay | Admitting: Adult Health

## 2021-07-08 ENCOUNTER — Other Ambulatory Visit: Payer: Self-pay | Admitting: Adult Health

## 2021-07-08 ENCOUNTER — Non-Acute Institutional Stay (SKILLED_NURSING_FACILITY): Payer: Medicare Other | Admitting: Adult Health

## 2021-07-08 DIAGNOSIS — S82832S Other fracture of upper and lower end of left fibula, sequela: Secondary | ICD-10-CM

## 2021-07-08 DIAGNOSIS — S82892S Other fracture of left lower leg, sequela: Secondary | ICD-10-CM | POA: Diagnosis not present

## 2021-07-08 MED ORDER — ALPRAZOLAM 0.25 MG PO TABS: 0.2500 mg | ORAL_TABLET | Freq: Three times a day (TID) | ORAL | 0 refills | Status: DC | PRN

## 2021-07-08 NOTE — Telephone Encounter (Signed)
Call received from [facility] per Jane Lew regarding new problem, closed fracture of left ankle, per Emergency room visit 07/05/21. Requests appointment - please review regarding scheduling; patient's notes indicate '[place] lift' - please advise.

## 2021-07-08 NOTE — Progress Notes (Unsigned)
Location:  [place] Room Number: 125/P Place of Service:  SNF (31)   CODE STATUS: DNR   Allergies  Allergen Reactions   Chloral Hydrate Other (See Comments)    unknown   Chief Complaint  Patient presents with   Acute Visit    Follow up ED visit      HPI:  She had started to complain of left ankle pain and swelling. She had an x-ray done which was positive for left ankle fracture. She was taken to the ED for further examination. The hospital did confirm the fracture and she was placed in an ace wrap cast. Her goal remains to return home in the near future.    Past Medical History:  Diagnosis Date   Asthma    Breast cancer ([place]) 02/20/83   LT mastectomy   Bronchitis    Dementia ([place])    Diabetes mellitus ([place])    Diverticulitis 2009   Glaucoma    HTN (hypertension)    OA (osteoarthritis)    Obesity 08/17/2014   Parkinson disease ([place])    Stroke [facility])    Thyroid nodule    Dr Legrand Rams    Past Surgical History:  Procedure Laterality Date    bilateral catracts     ABDOMINAL HYSTERECTOMY     complete   APPENDECTOMY     EUS  09/10/2011   Procedure: UPPER ENDOSCOPIC ULTRASOUND (EUS) RADIAL;  Surgeon: Arta Silence, MD;  Location: [facility] ENDOSCOPY;  Service: Endoscopy;  Laterality: N/A;  christina/ebp   FINE NEEDLE ASPIRATION  09/10/2011   Procedure: FINE NEEDLE ASPIRATION (FNA) LINEAR;  Surgeon: Arta Silence, MD;  Location: [facility] ENDOSCOPY;  Service: Endoscopy;  Laterality: N/A;   FRACTURE SURGERY  2008    right and left femurs   JOINT REPLACEMENT  1998/1999   knee boths   KNEE SURGERY Right    TKA   KNEE SURGERY Left    TKA   KNEE SURGERY Right    Periprosthetic fracture /otif   MASTECTOMY  02/20/1983   left    PARS PLANA VITRECTOMY Right 06/06/2016   Procedure: VITRECTOMY WITH CULTURES AND INJECTION OF ANTIBIOTICS; ANTERIOR CHAMBER WASHOUT;  Surgeon: Jalene Mullet, MD;  Location: [place];  Service: Ophthalmology;  Laterality: Right;    Social  History   Socioeconomic History   Marital status: Widowed    Spouse name: Not on file   Number of children: 6   Years of education: 85   Highest education level: Not on file  Occupational History   Occupation: retired, 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   Retired   Right handed.   Education high school   Caffeine None            Social Determinants of Health   Financial Resource Strain: Not on file  Food Insecurity: Not on file  Transportation Needs: Not on file  Physical Activity: Not on file  Stress: Not on file  Social Connections: Not on file  Intimate Partner Violence: Not on file   Family History  Problem Relation Age of Onset   Huntington's disease Mother  Stroke Father    Colon polyps Daughter 41   Throat cancer Son       VITAL SIGNS BP 138/76   Pulse 84   Temp (!) 97.5 F (36.4 C)   Resp 20   Ht '5\' 4"'$  (1.626 m)   Wt 215 lb 12.8 oz (97.9 kg)   SpO2 97%   BMI 37.04 kg/m   Outpatient Encounter Medications as of 07/08/2021  Medication Sig   acetaminophen (TYLENOL) 500 MG tablet Take 500 mg by mouth 2 (two) times daily as needed (Pain).   albuterol (PROVENTIL HFA;VENTOLIN HFA) 108 (90 Base) MCG/ACT inhaler Inhale 2 puffs into the lungs every 6 (six) hours as needed for wheezing or shortness of breath.   Balsam [place]-Castor Oil (VENELEX) OINT Apply topically 3 (three) times daily.  Apply to sacrum and bilateral buttocks and coccyx qshift for prevention   Calcium Carbonate Antacid (TUMS PO) Take 2 tablets by mouth daily as needed (acid reflux).   Calcium Carbonate-Vitamin D 600-200 MG-UNIT TABS Take 1 tablet by mouth every evening.   clopidogrel (PLAVIX)  75 MG tablet Take 1 tablet (75 mg total) by mouth daily with breakfast.   donepezil (ARICEPT) 10 MG tablet Take 1 tablet (10 mg total) by mouth at bedtime.   feeding supplement (ENSURE ENLIVE / ENSURE PLUS) LIQD Take 237 mLs by mouth 2 (two) times daily between meals.   ferrous sulfate 325 (65 FE) MG tablet Take 325 mg by mouth at bedtime.   FLUoxetine (PROZAC) 40 MG capsule Take 40 mg by mouth daily.   folic acid (FOLVITE) 1 MG tablet Take 1 tablet (1 mg total) by mouth daily.   gabapentin (NEURONTIN) 300 MG capsule Take 1 capsule (300 mg total) by mouth 3 (three) times daily.   ipratropium-albuterol (DUONEB) 0.5-2.5 (3) MG/3ML SOLN Take 3 mLs by nebulization 2 (two) times daily. And every 6 hours as needed   latanoprost (XALATAN) 0.005 % ophthalmic solution Place 1 drop into both eyes at bedtime.   levETIRAcetam (KEPPRA) 250 MG tablet Take 1 tablet (250 mg total) by mouth daily.   lovastatin (MEVACOR) 10 MG tablet Take 1 tablet (10 mg total) by mouth at bedtime.   meclizine (ANTIVERT) 12.5 MG tablet Take 1 tablet (12.5 mg total) by mouth 3 (three) times daily as needed for dizziness.   OXYGEN Inhale 2 L/min into the lungs continuous. Titrate as needed to maintain satuation > 90%   timolol (TIMOPTIC) 0.5 % ophthalmic solution Place 1 drop into the left eye daily. Wait at least 5 minutes between use of multiple drops in same eye   traMADol (ULTRAM) 50 MG tablet Take by mouth every 8 (eight) hours as needed (For pain to right ankle).   [DISCONTINUED] ALPRAZolam (XANAX) 0.25 MG tablet Take 1 tablet (0.25 mg total) by mouth 3 (three) times daily as needed for anxiety or sleep.   No facility-administered encounter medications on file as of 07/08/2021.     SIGNIFICANT DIAGNOSTIC EXAMS  PREVIOUS   06-13-21: chest x-ray 1. New right upper lobe collapse with associated volume loss and mild rightward mediastinal shift. 2. The left lung is clear.  06-13-21: ct of head:  Atrophy with small vessel  chronic ischemic changes of deep cerebral white matter. No acute intracranial abnormalities  06-13-21: ct of chest abdomen and pelvis:  Chest: 1. Distal right mainstem bronchus and portions of the proximal right upper lobe segmental airway opacification with postobstructive atelectasis greatest within the posterior segment of the right upper lobe.  This may be secondary to mucosal opacification. There are airspace opacities concerning for pneumonia within the right upper and lower lobes. Small right pleural effusion. It is difficult to exclude the presence of underlying solid pulmonary nodules. Consider follow-up PA and lateral radiographs 6 weeks after treatment to ensure resolution of the right lung findings. 2. Mild cardiomegaly. Abdomen/pelvis: 1. High-grade distal and more moderate proximal diffuse colonic diverticulosis without inflammatory changes to indicate acute diverticulitis. 2. Tiny dependent gallstone. 3. Grade 1 anterolisthesis of L4 on L5, degenerative in etiology without a pars defect visualized. Aortic Atherosclerosis   TODAY  07-05-21: left ankle x-ray:  1. Acute laterally displaced and medial apex angulated transsyndesmotic distal fibular fracture. 2. Acute comminuted and laterally displaced medial malleolar fracture. 3. Associated lateral dislocation of the talus in relation to the tibial plafond. Query associated rotational component. 4. No acute displaced fracture or dislocation of the bones of the foot.  LABS REVIEWED PREVIOUS  06-13-21; wbc 11.0; hgb 10.4; hct 37.6; mcv 107.1; plt 575; glucose 71; bun 39; creat 1.88; k+ 3.6; na++ 147; ca 9.1; gfr 25; protein 6.0; albumin 2.3; ammonia 36; hgb a1c 5.6; blood culture: no growth 06-16-21: wbc 12.8; hgb 8.5; hct 29.8; mcv 101.4 plt 467; glucose 207; bun 24; creat 1.26; k+ 3.6; na++ 149; ca 9.1; gfr 40; protein 5.5; albumin 2.4 06-18-21: chol 142; lsl 65; trig 120; hdl 53 06-20-21; wbc 15.2; hgb 9.1; hct 30.1; mcv 98.4 plt 274;  glucose 102; bun 17; creat 1.23; k+ 4.1; na++ 140; ca 8.4; gfr 41; protein 4.8; albumin 2.4   TODAY  05-21-21: wbc 17.5; hgb 9.2; hct 30.8; mcv 99.0 plt 298; glucose 117; bun 19; creat 1.28; k+ 4.0; na++ 144; ca 8.9; gfr 39 total protein 5.3; albumin 2.7; mag 2.2 phos 3.7    Review of Systems  Constitutional:  Negative for malaise/fatigue.  Respiratory:  Negative for cough and shortness of breath.   Cardiovascular:  Negative for chest pain, palpitations and leg swelling.  Gastrointestinal:  Negative for abdominal pain, constipation and heartburn.  Musculoskeletal:  Positive for joint pain. Negative for back pain and myalgias.       Ankle pain   Skin: Negative.   Neurological:  Negative for dizziness.  Psychiatric/Behavioral:  The patient is not nervous/anxious.    Physical Exam Constitutional:      General: She is not in acute distress.    Appearance: She is well-developed. She is obese. She is not diaphoretic.  Neck:     Thyroid: No thyromegaly.  Cardiovascular:     Rate and Rhythm: Normal rate and regular rhythm.     Pulses: Normal pulses.     Heart sounds: Normal heart sounds.  Pulmonary:     Effort: Pulmonary effort is normal. No respiratory distress.     Breath sounds: Normal breath sounds.  Abdominal:     General: Bowel sounds are normal. There is no distension.     Palpations: Abdomen is soft.     Tenderness: There is no abdominal tenderness.  Musculoskeletal:     Cervical back: Neck supple.     Right lower leg: No edema.     Left lower leg: No edema.     Comments: Left ankle in ace wrap cast   Lymphadenopathy:     Cervical: No cervical adenopathy.  Skin:    General: Skin is warm and dry.  Neurological:     Mental Status: She is alert. Mental status is at baseline.  Psychiatric:  Mood and Affect: Mood normal.        ASSESSMENT/ PLAN:  TODAY  Other closed fracture of distal end of fibula sequela Malleolar  fracture; left sequela  Will keep lower  extremity in cast Will setup orthopedic follow up    Ok Edwards NP [facility]   call [phone]

## 2021-07-10 ENCOUNTER — Other Ambulatory Visit: Payer: Self-pay | Admitting: Adult Health

## 2021-07-10 ENCOUNTER — Encounter: Payer: Self-pay | Admitting: Adult Health

## 2021-07-10 ENCOUNTER — Non-Acute Institutional Stay (SKILLED_NURSING_FACILITY): Payer: Medicare Other | Admitting: Adult Health

## 2021-07-10 DIAGNOSIS — I129 Hypertensive chronic kidney disease with stage 1 through stage 4 chronic kidney disease, or unspecified chronic kidney disease: Secondary | ICD-10-CM | POA: Diagnosis not present

## 2021-07-10 DIAGNOSIS — A419 Sepsis, unspecified organism: Secondary | ICD-10-CM | POA: Diagnosis not present

## 2021-07-10 DIAGNOSIS — N183 Chronic kidney disease, stage 3 unspecified: Secondary | ICD-10-CM | POA: Diagnosis not present

## 2021-07-10 DIAGNOSIS — F0150 Vascular dementia without behavioral disturbance: Secondary | ICD-10-CM

## 2021-07-10 DIAGNOSIS — J9621 Acute and chronic respiratory failure with hypoxia: Secondary | ICD-10-CM | POA: Diagnosis not present

## 2021-07-10 DIAGNOSIS — Z66 Do not resuscitate: Secondary | ICD-10-CM

## 2021-07-10 DIAGNOSIS — S82402A Unspecified fracture of shaft of left fibula, initial encounter for closed fracture: Secondary | ICD-10-CM | POA: Insufficient documentation

## 2021-07-10 DIAGNOSIS — R6521 Severe sepsis with septic shock: Secondary | ICD-10-CM | POA: Diagnosis not present

## 2021-07-10 DIAGNOSIS — S82892S Other fracture of left lower leg, sequela: Secondary | ICD-10-CM | POA: Insufficient documentation

## 2021-07-10 DIAGNOSIS — J189 Pneumonia, unspecified organism: Secondary | ICD-10-CM

## 2021-07-10 DIAGNOSIS — M5431 Sciatica, right side: Secondary | ICD-10-CM

## 2021-07-10 MED ORDER — FOLIC ACID 1 MG PO TABS: 1.0000 mg | ORAL_TABLET | Freq: Every day | ORAL | 0 refills | Status: AC

## 2021-07-10 MED ORDER — LEVETIRACETAM 250 MG PO TABS: 250.0000 mg | ORAL_TABLET | Freq: Every day | ORAL | 0 refills | Status: AC

## 2021-07-10 MED ORDER — FLUOXETINE HCL 40 MG PO CAPS
40.0000 mg | ORAL_CAPSULE | Freq: Every day | ORAL | 0 refills | Status: AC
Start: 2021-07-10 — End: ?

## 2021-07-10 MED ORDER — TIMOLOL MALEATE 0.5 % OP SOLN: 1.0000 [drp] | Freq: Every day | OPHTHALMIC | 0 refills | Status: AC

## 2021-07-10 MED ORDER — FERROUS SULFATE 325 (65 FE) MG PO TABS: 325.0000 mg | ORAL_TABLET | Freq: Every day | ORAL | 0 refills | Status: AC

## 2021-07-10 MED ORDER — ALBUTEROL SULFATE HFA 108 (90 BASE) MCG/ACT IN AERS
2.0000 | INHALATION_SPRAY | Freq: Four times a day (QID) | RESPIRATORY_TRACT | 0 refills | Status: AC | PRN
Start: 2021-07-10 — End: ?

## 2021-07-10 MED ORDER — MECLIZINE HCL 12.5 MG PO TABS: 12.5000 mg | ORAL_TABLET | Freq: Three times a day (TID) | ORAL | 0 refills | Status: AC | PRN

## 2021-07-10 MED ORDER — ALPRAZOLAM 0.25 MG PO TABS: 0.2500 mg | ORAL_TABLET | Freq: Three times a day (TID) | ORAL | 0 refills | Status: AC | PRN

## 2021-07-10 MED ORDER — LOVASTATIN 10 MG PO TABS: 10.0000 mg | ORAL_TABLET | Freq: Every day | ORAL | 0 refills | Status: AC

## 2021-07-10 MED ORDER — LATANOPROST 0.005 % OP SOLN: 1.0000 [drp] | Freq: Every day | OPHTHALMIC | 0 refills | Status: AC

## 2021-07-10 MED ORDER — GABAPENTIN 300 MG PO CAPS: 300.0000 mg | ORAL_CAPSULE | Freq: Three times a day (TID) | ORAL | 0 refills | Status: AC

## 2021-07-10 MED ORDER — TRAMADOL HCL 50 MG PO TABS: 50.0000 mg | ORAL_TABLET | Freq: Three times a day (TID) | ORAL | 0 refills | Status: DC | PRN

## 2021-07-10 MED ORDER — CLOPIDOGREL BISULFATE 75 MG PO TABS: 75.0000 mg | ORAL_TABLET | Freq: Every day | ORAL | 0 refills | Status: AC

## 2021-07-10 MED ORDER — DONEPEZIL HCL 10 MG PO TABS: 10.0000 mg | ORAL_TABLET | Freq: Every day | ORAL | 0 refills | Status: AC

## 2021-07-10 MED ORDER — IPRATROPIUM-ALBUTEROL 0.5-2.5 (3) MG/3ML IN SOLN: 3.0000 mL | Freq: Two times a day (BID) | RESPIRATORY_TRACT | 0 refills | Status: AC

## 2021-07-10 NOTE — Progress Notes (Signed)
Location:  [place] Room Number: 125-P Place of Service:  SNF (31)  Provider: Ok Edwards np   PCP: Carrolyn Meiers, MD Patient Care Team: Carrolyn Meiers, MD as PCP - General (Internal Medicine) Danie Binder, MD (Inactive) (Gastroenterology)  Extended Emergency Contact Information Primary Emergency Contact: Alvira Monday Address: [address]          [place], [place] [address] [place] Phone: [phone] Work Phone: [phone] Mobile Phone: [phone] Relation: Daughter Secondary Emergency Contact: [place] Phone: [phone] Relation: Daughter  Code Status: dnr  Goals of care:  Advanced Directive information    07/10/2021   11:20 AM  Advanced Directives  Does Patient Have a Medical Advance Directive? Yes  Type of Advance Directive Out of facility DNR (pink MOST or yellow form)  Does patient want to make changes to medical advance directive? No - Patient declined  Pre-existing out of facility DNR order (yellow form or pink MOST form) Yellow form placed in chart (order not valid for inpatient use)     Allergies  Allergen Reactions   Chloral Hydrate Other (See Comments)    unknown    Chief Complaint  Patient presents with   Discharge Note    Discharge from [facility]    HPI:  88 y.o. female  being discharged to home with home health for pt/ot. She will need a lift for transfers; however the family states they will not get this equipment safety information regarding any risk has been given to the family. She had been hospitalized septic shock; pneumonia; respiratory failure. She was admitted to this facility for short term rehab. She did get an ankle fracture while here. She is ready to return back home.     Past Medical History:  Diagnosis Date   Asthma    Breast cancer ([place]) 02/20/83   LT mastectomy   Bronchitis    Dementia ([place])     Diabetes mellitus ([place])    Diverticulitis 2009   Glaucoma    HTN (hypertension)    OA (osteoarthritis)    Obesity 08/17/2014   Parkinson disease ([place])    Stroke [facility])    Thyroid nodule    Dr Legrand Rams    Past Surgical History:  Procedure Laterality Date    bilateral catracts     ABDOMINAL HYSTERECTOMY     complete   APPENDECTOMY     EUS  09/10/2011   Procedure: UPPER ENDOSCOPIC ULTRASOUND (EUS) RADIAL;  Surgeon: Arta Silence, MD;  Location: [facility] ENDOSCOPY;  Service: Endoscopy;  Laterality: N/A;  christina/ebp   FINE NEEDLE ASPIRATION  09/10/2011   Procedure: FINE NEEDLE ASPIRATION (FNA) LINEAR;  Surgeon: Arta Silence, MD;  Location: [facility] ENDOSCOPY;  Service: Endoscopy;  Laterality: N/A;   FRACTURE SURGERY  2008    right and left femurs   JOINT REPLACEMENT  1998/1999   knee boths   KNEE SURGERY Right    TKA   KNEE SURGERY Left    TKA   KNEE SURGERY Right    Periprosthetic fracture /otif   MASTECTOMY  02/20/1983   left    PARS PLANA VITRECTOMY Right 06/06/2016   Procedure: VITRECTOMY WITH CULTURES AND INJECTION OF ANTIBIOTICS; ANTERIOR CHAMBER WASHOUT;  Surgeon: Jalene Mullet, MD;  Location: [facility];  Service: Ophthalmology;  Laterality: Right;      reports that she has never smoked. She has never used smokeless tobacco. She reports that she does  not drink alcohol and does not use drugs. 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   Retired   Right handed.    Education high school   Caffeine None            Social Determinants of Health   Financial Resource Strain: Not on file  Food Insecurity: Not on file  Transportation Needs: Not on file  Physical Activity: Not on file  Stress: Not on file  Social Connections: Not on file  Intimate Partner Violence: Not on file   Functional Status Survey:    Allergies  Allergen Reactions   Chloral Hydrate Other (See Comments)    unknown    Pertinent  Health Maintenance Due  Topic Date Due   FOOT EXAM  Never done   OPHTHALMOLOGY EXAM  Never done   URINE MICROALBUMIN  Never done   DEXA SCAN  Never done   INFLUENZA VACCINE  08/06/2021   HEMOGLOBIN A1C  12/13/2021    Medications: Outpatient Encounter Medications as of 07/10/2021  Medication Sig   acetaminophen (TYLENOL) 500 MG tablet Take 500 mg by mouth 2 (two) times daily. Scheduled and once daily as needed   albuterol (VENTOLIN HFA) 108 (90 Base) MCG/ACT inhaler Inhale 2 puffs into the lungs every 6 (six) hours as needed for wheezing or shortness of breath.   ALPRAZolam (XANAX) 0.25 MG tablet Take 1 tablet (0.25 mg total) by mouth every 8 (eight) hours as needed for anxiety.   Balsam Peru-Castor Oil (VENELEX) OINT Apply topically 3 (three) times daily.  Apply to sacrum and bilateral buttocks and coccyx qshift for prevention   Calcium Carbonate Antacid (TUMS PO) Take 2 tablets by mouth daily as needed (acid reflux).   Calcium Carbonate-Vitamin D 600-200 MG-UNIT TABS Take 1 tablet by mouth every evening.   clopidogrel (PLAVIX) 75 MG tablet Take 1 tablet (75 mg total) by mouth daily with breakfast.   donepezil (ARICEPT) 10 MG tablet Take 1 tablet (10 mg total) by mouth at bedtime.   feeding supplement (ENSURE ENLIVE / ENSURE PLUS) LIQD Take 237 mLs by mouth 2 (two) times daily between meals.   ferrous sulfate 325 (65 FE) MG tablet Take 1 tablet (325 mg total) by mouth at bedtime.   FLUoxetine (PROZAC) 40 MG capsule Take 1 capsule (40 mg total)  by mouth daily.   folic acid (FOLVITE) 1 MG tablet Take 1 tablet (1 mg total) by mouth daily.   gabapentin (NEURONTIN) 300 MG capsule Take 1 capsule (300 mg total) by mouth 3 (three) times daily.   ipratropium-albuterol (DUONEB) 0.5-2.5 (3) MG/3ML SOLN Take 3 mLs by nebulization 2 (two) times daily. And every 6 hours as needed   latanoprost (XALATAN) 0.005 % ophthalmic solution Place 1 drop into both eyes at bedtime.   levETIRAcetam (KEPPRA) 250 MG tablet Take 1 tablet (250 mg total) by mouth  daily.   lovastatin (MEVACOR) 10 MG tablet Take 1 tablet (10 mg total) by mouth at bedtime.   meclizine (ANTIVERT) 12.5 MG tablet Take 1 tablet (12.5 mg total) by mouth 3 (three) times daily as needed for dizziness.   OXYGEN Inhale 2 L/min into the lungs continuous. Titrate as needed to maintain satuation > 90%   timolol (TIMOPTIC) 0.5 % ophthalmic solution Place 1 drop into the left eye daily. Wait at least 5 minutes between use of multiple drops in same eye   traMADol (ULTRAM) 50 MG tablet Take 1 tablet (50 mg total) by mouth every 8 (eight) hours as needed (For pain to right ankle).   UNABLE TO FIND Diet- Regular thin liquids   No facility-administered encounter medications on file as of 07/10/2021.    Vitals:   07/10/21 1109  BP: 138/76  Pulse: 84  Resp: 20  Temp: (!) 97.5 F (36.4 C)  SpO2: 98%  Weight: 215 lb (97.5 kg)  Height: '5\' 4"'$  (1.626 m)   Body mass index is 36.9 kg/m.   SIGNIFICANT DIAGNOSTIC EXAMS  PREVIOUS   06-13-21: chest x-ray 1. New right upper lobe collapse with associated volume loss and mild rightward mediastinal shift. 2. The left lung is clear.  06-13-21: ct of head:  Atrophy with small vessel chronic ischemic changes of deep cerebral white matter. No acute intracranial abnormalities  06-13-21: ct of chest abdomen and pelvis:  Chest: 1. Distal right mainstem bronchus and portions of the proximal right upper lobe segmental airway opacification with postobstructive  atelectasis greatest within the posterior segment of the right upper lobe. This may be secondary to mucosal opacification. There are airspace opacities concerning for pneumonia within the right upper and lower lobes. Small right pleural effusion. It is difficult to exclude the presence of underlying solid pulmonary nodules. Consider follow-up PA and lateral radiographs 6 weeks after treatment to ensure resolution of the right lung findings. 2. Mild cardiomegaly. Abdomen/pelvis: 1. High-grade distal and more moderate proximal diffuse colonic diverticulosis without inflammatory changes to indicate acute diverticulitis. 2. Tiny dependent gallstone. 3. Grade 1 anterolisthesis of L4 on L5, degenerative in etiology without a pars defect visualized. Aortic Atherosclerosis   07-05-21: left ankle x-ray:  1. Acute laterally displaced and medial apex angulated transsyndesmotic distal fibular fracture. 2. Acute comminuted and laterally displaced medial malleolar fracture. 3. Associated lateral dislocation of the talus in relation to the tibial plafond. Query associated rotational component. 4. No acute displaced fracture or dislocation of the bones of the foot.  LABS REVIEWED PREVIOUS  06-13-21; wbc 11.0; hgb 10.4; hct 37.6; mcv 107.1; plt 575; glucose 71; bun 39; creat 1.88; k+ 3.6; na++ 147; ca 9.1; gfr 25; protein 6.0; albumin 2.3; ammonia 36; hgb a1c 5.6; blood culture: no growth 06-16-21: wbc 12.8; hgb 8.5; hct 29.8; mcv 101.4 plt 467; glucose 207; bun 24; creat 1.26; k+ 3.6; na++ 149; ca 9.1; gfr 40; protein 5.5; albumin 2.4 06-18-21: chol 142; lsl 65; trig 120; hdl 53 06-20-21; wbc 15.2; hgb 9.1; hct 30.1; mcv 98.4 plt 274; glucose 102; bun 17; creat 1.23; k+ 4.1; na++ 140; ca 8.4; gfr 41; protein 4.8; albumin 2.4  05-21-21: wbc 17.5; hgb 9.2; hct 30.8; mcv 99.0 plt 298; glucose 117; bun 19; creat 1.28; k+ 4.0; na++ 144; ca 8.9; gfr 39 total protein 5.3; albumin 2.7; mag 2.2 phos 3.7    Review of Systems   Constitutional:  Negative for malaise/fatigue.  Respiratory:  Negative for cough and shortness of breath.  Cardiovascular:  Negative for chest pain, palpitations and leg swelling.  Gastrointestinal:  Negative for abdominal pain, constipation and heartburn.  Musculoskeletal:  Negative for back pain, joint pain and myalgias.  Skin: Negative.   Neurological:  Negative for dizziness.  Psychiatric/Behavioral:  The patient is not nervous/anxious.     Physical Exam Constitutional:      General: She is not in acute distress.    Appearance: She is well-developed. She is obese. She is not diaphoretic.  Neck:     Thyroid: No thyromegaly.  Cardiovascular:     Rate and Rhythm: Normal rate and regular rhythm.     Pulses: Normal pulses.     Heart sounds: Normal heart sounds.  Pulmonary:     Effort: Pulmonary effort is normal. No respiratory distress.     Breath sounds: Normal breath sounds.  Abdominal:     General: Bowel sounds are normal. There is no distension.     Palpations: Abdomen is soft.     Tenderness: There is no abdominal tenderness.  Musculoskeletal:        General: Normal range of motion.     Cervical back: Neck supple.     Right lower leg: No edema.     Left lower leg: No edema.     Comments:  Left ankle in ace wrap cast    Lymphadenopathy:     Cervical: No cervical adenopathy.  Skin:    General: Skin is warm and dry.  Neurological:     Mental Status: She is alert. Mental status is at baseline.  Psychiatric:        Mood and Affect: Mood normal.      Assessment/Plan:    Patient is being discharged with the following home health services:  pt/ot to evaluate and treat as indicated for gait balance strength adl training.   Patient is being discharged with the following durable medical equipment:  lift; for patient transfers   Patient has been advised to f/u with their PCP in 1-2 weeks to for a transitions of care visit.  Social services at their facility was  responsible for arranging this appointment.  Pt was provided with adequate prescriptions of noncontrolled medications to reach the scheduled appointment .  For controlled substances, a limited supply was provided as appropriate for the individual patient.  If the pt normally receives these medications from a pain clinic or has a contract with another physician, these medications should be received from that clinic or physician only).    A 30 day supply of her prescription medications have been sent to [facility] with #10 xanax 0.25 mg and #10 ultram 50 mg tabs.   Time spent with patient 40 minutes: dme; medications; home health    Ok Edwards NP [facility]   call [phone]

## 2021-07-10 NOTE — Telephone Encounter (Signed)
Guadalupe no answer after several rings - will try back. In the interim, I called patient's daughter, designated contact on file at [phone], and was told that they just brought patient home. I offered the appointment for tomorrow as noted, however, daughter states they will need to arrange transportation. Daughter also said that patient was to see Dr Aline Brochure in 8 weeks. Daughter is to call us back tomorrow once "she gets mom settled in" to scheduled.

## 2021-07-11 DIAGNOSIS — E1165 Type 2 diabetes mellitus with hyperglycemia: Secondary | ICD-10-CM | POA: Diagnosis not present

## 2021-07-11 DIAGNOSIS — I10 Essential (primary) hypertension: Secondary | ICD-10-CM | POA: Diagnosis not present

## 2021-07-12 DIAGNOSIS — M5136 Other intervertebral disc degeneration, lumbar region: Secondary | ICD-10-CM | POA: Diagnosis not present

## 2021-07-12 DIAGNOSIS — H409 Unspecified glaucoma: Secondary | ICD-10-CM | POA: Diagnosis not present

## 2021-07-12 DIAGNOSIS — F329 Major depressive disorder, single episode, unspecified: Secondary | ICD-10-CM | POA: Diagnosis not present

## 2021-07-12 DIAGNOSIS — Z8701 Personal history of pneumonia (recurrent): Secondary | ICD-10-CM | POA: Diagnosis not present

## 2021-07-12 DIAGNOSIS — S8252XD Displaced fracture of medial malleolus of left tibia, subsequent encounter for closed fracture with routine healing: Secondary | ICD-10-CM | POA: Diagnosis not present

## 2021-07-12 DIAGNOSIS — M1990 Unspecified osteoarthritis, unspecified site: Secondary | ICD-10-CM | POA: Diagnosis not present

## 2021-07-12 DIAGNOSIS — F0150 Vascular dementia without behavioral disturbance: Secondary | ICD-10-CM | POA: Diagnosis not present

## 2021-07-12 DIAGNOSIS — I1310 Hypertensive heart and chronic kidney disease without heart failure, with stage 1 through stage 4 chronic kidney disease, or unspecified chronic kidney disease: Secondary | ICD-10-CM | POA: Diagnosis not present

## 2021-07-12 DIAGNOSIS — F419 Anxiety disorder, unspecified: Secondary | ICD-10-CM | POA: Diagnosis not present

## 2021-07-12 DIAGNOSIS — G40909 Epilepsy, unspecified, not intractable, without status epilepticus: Secondary | ICD-10-CM | POA: Diagnosis not present

## 2021-07-12 DIAGNOSIS — S82832D Other fracture of upper and lower end of left fibula, subsequent encounter for closed fracture with routine healing: Secondary | ICD-10-CM | POA: Diagnosis not present

## 2021-07-12 DIAGNOSIS — M4316 Spondylolisthesis, lumbar region: Secondary | ICD-10-CM | POA: Diagnosis not present

## 2021-07-12 DIAGNOSIS — Z9981 Dependence on supplemental oxygen: Secondary | ICD-10-CM | POA: Diagnosis not present

## 2021-07-12 DIAGNOSIS — J9611 Chronic respiratory failure with hypoxia: Secondary | ICD-10-CM | POA: Diagnosis not present

## 2021-07-12 DIAGNOSIS — Z9181 History of falling: Secondary | ICD-10-CM | POA: Diagnosis not present

## 2021-07-12 DIAGNOSIS — J440 Chronic obstructive pulmonary disease with acute lower respiratory infection: Secondary | ICD-10-CM | POA: Diagnosis not present

## 2021-07-12 DIAGNOSIS — K5730 Diverticulosis of large intestine without perforation or abscess without bleeding: Secondary | ICD-10-CM | POA: Diagnosis not present

## 2021-07-12 DIAGNOSIS — S93315D Dislocation of tarsal joint of left foot, subsequent encounter: Secondary | ICD-10-CM | POA: Diagnosis not present

## 2021-07-12 DIAGNOSIS — G20 Parkinson's disease: Secondary | ICD-10-CM | POA: Diagnosis not present

## 2021-07-12 DIAGNOSIS — Z853 Personal history of malignant neoplasm of breast: Secondary | ICD-10-CM | POA: Diagnosis not present

## 2021-07-12 DIAGNOSIS — N1832 Chronic kidney disease, stage 3b: Secondary | ICD-10-CM | POA: Diagnosis not present

## 2021-07-12 DIAGNOSIS — Z8673 Personal history of transient ischemic attack (TIA), and cerebral infarction without residual deficits: Secondary | ICD-10-CM | POA: Diagnosis not present

## 2021-07-12 DIAGNOSIS — M859 Disorder of bone density and structure, unspecified: Secondary | ICD-10-CM | POA: Diagnosis not present

## 2021-07-12 DIAGNOSIS — E1122 Type 2 diabetes mellitus with diabetic chronic kidney disease: Secondary | ICD-10-CM | POA: Diagnosis not present

## 2021-07-12 DIAGNOSIS — F0280 Dementia in other diseases classified elsewhere without behavioral disturbance: Secondary | ICD-10-CM | POA: Diagnosis not present

## 2021-07-16 DIAGNOSIS — I1310 Hypertensive heart and chronic kidney disease without heart failure, with stage 1 through stage 4 chronic kidney disease, or unspecified chronic kidney disease: Secondary | ICD-10-CM | POA: Diagnosis not present

## 2021-07-16 DIAGNOSIS — J9611 Chronic respiratory failure with hypoxia: Secondary | ICD-10-CM | POA: Diagnosis not present

## 2021-07-16 DIAGNOSIS — S93315D Dislocation of tarsal joint of left foot, subsequent encounter: Secondary | ICD-10-CM | POA: Diagnosis not present

## 2021-07-16 DIAGNOSIS — S82832D Other fracture of upper and lower end of left fibula, subsequent encounter for closed fracture with routine healing: Secondary | ICD-10-CM | POA: Diagnosis not present

## 2021-07-16 DIAGNOSIS — S8252XD Displaced fracture of medial malleolus of left tibia, subsequent encounter for closed fracture with routine healing: Secondary | ICD-10-CM | POA: Diagnosis not present

## 2021-07-16 DIAGNOSIS — J440 Chronic obstructive pulmonary disease with acute lower respiratory infection: Secondary | ICD-10-CM | POA: Diagnosis not present

## 2021-07-17 DIAGNOSIS — I1310 Hypertensive heart and chronic kidney disease without heart failure, with stage 1 through stage 4 chronic kidney disease, or unspecified chronic kidney disease: Secondary | ICD-10-CM | POA: Diagnosis not present

## 2021-07-17 DIAGNOSIS — J440 Chronic obstructive pulmonary disease with acute lower respiratory infection: Secondary | ICD-10-CM | POA: Diagnosis not present

## 2021-07-17 DIAGNOSIS — S8252XD Displaced fracture of medial malleolus of left tibia, subsequent encounter for closed fracture with routine healing: Secondary | ICD-10-CM | POA: Diagnosis not present

## 2021-07-17 DIAGNOSIS — S93315D Dislocation of tarsal joint of left foot, subsequent encounter: Secondary | ICD-10-CM | POA: Diagnosis not present

## 2021-07-17 DIAGNOSIS — S82832D Other fracture of upper and lower end of left fibula, subsequent encounter for closed fracture with routine healing: Secondary | ICD-10-CM | POA: Diagnosis not present

## 2021-07-17 DIAGNOSIS — J9611 Chronic respiratory failure with hypoxia: Secondary | ICD-10-CM | POA: Diagnosis not present

## 2021-07-18 ENCOUNTER — Encounter: Payer: Medicare Other | Admitting: Orthopedic Surgery

## 2021-07-18 ENCOUNTER — Telehealth: Payer: Self-pay | Admitting: Radiology

## 2021-07-18 ENCOUNTER — Encounter: Payer: Self-pay | Admitting: Orthopedic Surgery

## 2021-07-18 NOTE — Telephone Encounter (Signed)
Appt from today needs to be rescheduled.  I called Gaspar Skeeters [phone].  NA, cannot leave a message.

## 2021-07-19 DIAGNOSIS — J9611 Chronic respiratory failure with hypoxia: Secondary | ICD-10-CM | POA: Diagnosis not present

## 2021-07-19 DIAGNOSIS — S8252XD Displaced fracture of medial malleolus of left tibia, subsequent encounter for closed fracture with routine healing: Secondary | ICD-10-CM | POA: Diagnosis not present

## 2021-07-19 DIAGNOSIS — J440 Chronic obstructive pulmonary disease with acute lower respiratory infection: Secondary | ICD-10-CM | POA: Diagnosis not present

## 2021-07-19 DIAGNOSIS — I1310 Hypertensive heart and chronic kidney disease without heart failure, with stage 1 through stage 4 chronic kidney disease, or unspecified chronic kidney disease: Secondary | ICD-10-CM | POA: Diagnosis not present

## 2021-07-19 DIAGNOSIS — S93315D Dislocation of tarsal joint of left foot, subsequent encounter: Secondary | ICD-10-CM | POA: Diagnosis not present

## 2021-07-19 DIAGNOSIS — S82832D Other fracture of upper and lower end of left fibula, subsequent encounter for closed fracture with routine healing: Secondary | ICD-10-CM | POA: Diagnosis not present

## 2021-07-19 NOTE — Telephone Encounter (Signed)
Patient scheduled 07/18/21 per daughter. Aware.

## 2021-07-22 ENCOUNTER — Encounter: Payer: Self-pay | Admitting: Orthopedic Surgery

## 2021-07-22 ENCOUNTER — Ambulatory Visit (INDEPENDENT_AMBULATORY_CARE_PROVIDER_SITE_OTHER): Payer: Medicare Other | Admitting: Orthopedic Surgery

## 2021-07-22 ENCOUNTER — Ambulatory Visit (INDEPENDENT_AMBULATORY_CARE_PROVIDER_SITE_OTHER): Payer: Medicare Other

## 2021-07-22 VITALS — BP 115/69 | Ht 64.0 in | Wt 215.0 lb

## 2021-07-22 DIAGNOSIS — S82842A Displaced bimalleolar fracture of left lower leg, initial encounter for closed fracture: Secondary | ICD-10-CM

## 2021-07-22 MED ORDER — TRAMADOL HCL 50 MG PO TABS: 50.0000 mg | ORAL_TABLET | Freq: Three times a day (TID) | ORAL | 0 refills | Status: DC | PRN

## 2021-07-22 MED ORDER — METHYLPREDNISOLONE ACETATE 40 MG/ML IJ SUSP
40.0000 mg | Freq: Once | INTRAMUSCULAR | Status: AC
  Administered 2021-07-22: 40 mg via INTRA_ARTICULAR

## 2021-07-22 NOTE — Patient Instructions (Signed)
NO WEIGHT BEARING ON LEFT FOOT

## 2021-07-22 NOTE — Addendum Note (Signed)
Addended byCandice Camp on: 07/22/2021 03:08 PM   Modules accepted: Orders

## 2021-07-22 NOTE — Progress Notes (Signed)
Chief Complaint  Patient presents with   Ankle Injury    07/05/21 left ankle fracture, requesting refill of Tramadol to facility    Established patient last seen July 18, 2021 she has a left ankle fracture dislocation which was reduced in the emergency room on 30 June she came in a couple of days ago but I was tied up in surgery and could not see her she is in a posterior sugar-tong splint  Repeat images were obtained.  The ankle is reduced it looks like a pronation external rotation Weber C type injury  We placed a cast on her she said it felt much better  She will have to be nonweightbearing for an extended period of time  Follow-up x-rays in 4 to 6 weeks  X-rays to be done in the plaster   Past Medical History:  Diagnosis Date   Asthma    Breast cancer ([place]) 02/20/83   LT mastectomy   Bronchitis    Dementia ([place])    Diabetes mellitus ([place])    Diverticulitis 2009   Glaucoma    HTN (hypertension)    OA (osteoarthritis)    Obesity 08/17/2014   Parkinson disease ([place])    Stroke ([place])    Thyroid nodule    Dr Legrand Rams   BP 115/69   Ht '5\' 4"'$  (1.626 m)   Wt 215 lb (97.5 kg)   BMI 36.90 kg/m   She has oxygen  She is not ambulatory she is in a wheelchair  We removed her splint she has some bruising no breaks in the skin  Foot was in plantigrade position ankle looked reduced  Color capillary refill was good  Imaging in the office showed that the ankle fracture reduction was maintained  Nonweightbearing short leg cast was applied  Meds ordered this encounter  Medications   traMADol (ULTRAM) 50 MG tablet    Sig: Take 1 tablet (50 mg total) by mouth every 8 (eight) hours as needed (For pain to right ankle).    Dispense:  10 tablet    Refill:  0

## 2021-07-23 DIAGNOSIS — J440 Chronic obstructive pulmonary disease with acute lower respiratory infection: Secondary | ICD-10-CM | POA: Diagnosis not present

## 2021-07-23 DIAGNOSIS — S82832D Other fracture of upper and lower end of left fibula, subsequent encounter for closed fracture with routine healing: Secondary | ICD-10-CM | POA: Diagnosis not present

## 2021-07-23 DIAGNOSIS — S93315D Dislocation of tarsal joint of left foot, subsequent encounter: Secondary | ICD-10-CM | POA: Diagnosis not present

## 2021-07-23 DIAGNOSIS — J9611 Chronic respiratory failure with hypoxia: Secondary | ICD-10-CM | POA: Diagnosis not present

## 2021-07-23 DIAGNOSIS — I1310 Hypertensive heart and chronic kidney disease without heart failure, with stage 1 through stage 4 chronic kidney disease, or unspecified chronic kidney disease: Secondary | ICD-10-CM | POA: Diagnosis not present

## 2021-07-23 DIAGNOSIS — S8252XD Displaced fracture of medial malleolus of left tibia, subsequent encounter for closed fracture with routine healing: Secondary | ICD-10-CM | POA: Diagnosis not present

## 2021-07-24 DIAGNOSIS — J440 Chronic obstructive pulmonary disease with acute lower respiratory infection: Secondary | ICD-10-CM | POA: Diagnosis not present

## 2021-07-24 DIAGNOSIS — S8252XD Displaced fracture of medial malleolus of left tibia, subsequent encounter for closed fracture with routine healing: Secondary | ICD-10-CM | POA: Diagnosis not present

## 2021-07-24 DIAGNOSIS — J9611 Chronic respiratory failure with hypoxia: Secondary | ICD-10-CM | POA: Diagnosis not present

## 2021-07-24 DIAGNOSIS — S82832D Other fracture of upper and lower end of left fibula, subsequent encounter for closed fracture with routine healing: Secondary | ICD-10-CM | POA: Diagnosis not present

## 2021-07-24 DIAGNOSIS — S93315D Dislocation of tarsal joint of left foot, subsequent encounter: Secondary | ICD-10-CM | POA: Diagnosis not present

## 2021-07-24 DIAGNOSIS — I1310 Hypertensive heart and chronic kidney disease without heart failure, with stage 1 through stage 4 chronic kidney disease, or unspecified chronic kidney disease: Secondary | ICD-10-CM | POA: Diagnosis not present

## 2021-07-25 DIAGNOSIS — J9611 Chronic respiratory failure with hypoxia: Secondary | ICD-10-CM | POA: Diagnosis not present

## 2021-07-25 DIAGNOSIS — S8252XD Displaced fracture of medial malleolus of left tibia, subsequent encounter for closed fracture with routine healing: Secondary | ICD-10-CM | POA: Diagnosis not present

## 2021-07-25 DIAGNOSIS — S82832D Other fracture of upper and lower end of left fibula, subsequent encounter for closed fracture with routine healing: Secondary | ICD-10-CM | POA: Diagnosis not present

## 2021-07-25 DIAGNOSIS — I1310 Hypertensive heart and chronic kidney disease without heart failure, with stage 1 through stage 4 chronic kidney disease, or unspecified chronic kidney disease: Secondary | ICD-10-CM | POA: Diagnosis not present

## 2021-07-25 DIAGNOSIS — J440 Chronic obstructive pulmonary disease with acute lower respiratory infection: Secondary | ICD-10-CM | POA: Diagnosis not present

## 2021-07-25 DIAGNOSIS — S93315D Dislocation of tarsal joint of left foot, subsequent encounter: Secondary | ICD-10-CM | POA: Diagnosis not present

## 2021-07-30 DIAGNOSIS — J9611 Chronic respiratory failure with hypoxia: Secondary | ICD-10-CM | POA: Diagnosis not present

## 2021-07-30 DIAGNOSIS — J440 Chronic obstructive pulmonary disease with acute lower respiratory infection: Secondary | ICD-10-CM | POA: Diagnosis not present

## 2021-07-30 DIAGNOSIS — S82832D Other fracture of upper and lower end of left fibula, subsequent encounter for closed fracture with routine healing: Secondary | ICD-10-CM | POA: Diagnosis not present

## 2021-07-30 DIAGNOSIS — I1310 Hypertensive heart and chronic kidney disease without heart failure, with stage 1 through stage 4 chronic kidney disease, or unspecified chronic kidney disease: Secondary | ICD-10-CM | POA: Diagnosis not present

## 2021-07-30 DIAGNOSIS — S93315D Dislocation of tarsal joint of left foot, subsequent encounter: Secondary | ICD-10-CM | POA: Diagnosis not present

## 2021-07-30 DIAGNOSIS — S8252XD Displaced fracture of medial malleolus of left tibia, subsequent encounter for closed fracture with routine healing: Secondary | ICD-10-CM | POA: Diagnosis not present

## 2021-07-31 DIAGNOSIS — S82832D Other fracture of upper and lower end of left fibula, subsequent encounter for closed fracture with routine healing: Secondary | ICD-10-CM | POA: Diagnosis not present

## 2021-07-31 DIAGNOSIS — I1310 Hypertensive heart and chronic kidney disease without heart failure, with stage 1 through stage 4 chronic kidney disease, or unspecified chronic kidney disease: Secondary | ICD-10-CM | POA: Diagnosis not present

## 2021-07-31 DIAGNOSIS — J440 Chronic obstructive pulmonary disease with acute lower respiratory infection: Secondary | ICD-10-CM | POA: Diagnosis not present

## 2021-07-31 DIAGNOSIS — S93315D Dislocation of tarsal joint of left foot, subsequent encounter: Secondary | ICD-10-CM | POA: Diagnosis not present

## 2021-07-31 DIAGNOSIS — S8252XD Displaced fracture of medial malleolus of left tibia, subsequent encounter for closed fracture with routine healing: Secondary | ICD-10-CM | POA: Diagnosis not present

## 2021-07-31 DIAGNOSIS — J9611 Chronic respiratory failure with hypoxia: Secondary | ICD-10-CM | POA: Diagnosis not present

## 2021-08-01 ENCOUNTER — Other Ambulatory Visit: Payer: Self-pay | Admitting: Adult Health

## 2021-08-04 ENCOUNTER — Other Ambulatory Visit: Payer: Self-pay

## 2021-08-04 ENCOUNTER — Emergency Department (HOSPITAL_COMMUNITY)
Admission: EM | Admit: 2021-08-04 | Discharge: 2021-08-05 | Disposition: A | Discharge status: Home / Self Care | Payer: Medicare Other | Attending: Emergency Medicine | Admitting: Emergency Medicine

## 2021-08-04 ENCOUNTER — Encounter (HOSPITAL_COMMUNITY): Payer: Self-pay

## 2021-08-04 DIAGNOSIS — R404 Transient alteration of awareness: Secondary | ICD-10-CM | POA: Diagnosis not present

## 2021-08-04 DIAGNOSIS — F0390 Unspecified dementia without behavioral disturbance: Secondary | ICD-10-CM | POA: Insufficient documentation

## 2021-08-04 DIAGNOSIS — R4020 Unspecified coma: Secondary | ICD-10-CM | POA: Diagnosis not present

## 2021-08-04 DIAGNOSIS — E11649 Type 2 diabetes mellitus with hypoglycemia without coma: Secondary | ICD-10-CM | POA: Diagnosis not present

## 2021-08-04 DIAGNOSIS — E162 Hypoglycemia, unspecified: Secondary | ICD-10-CM

## 2021-08-04 DIAGNOSIS — R4182 Altered mental status, unspecified: Secondary | ICD-10-CM | POA: Diagnosis present

## 2021-08-04 DIAGNOSIS — R410 Disorientation, unspecified: Secondary | ICD-10-CM | POA: Diagnosis not present

## 2021-08-04 DIAGNOSIS — G20 Parkinson's disease: Secondary | ICD-10-CM | POA: Diagnosis not present

## 2021-08-04 DIAGNOSIS — R231 Pallor: Secondary | ICD-10-CM | POA: Diagnosis not present

## 2021-08-04 LAB — COMPREHENSIVE METABOLIC PANEL
ALT: 8 U/L (ref 0–44)
AST: 15 U/L (ref 15–41)
Albumin: 2.6 g/dL — ABNORMAL LOW (ref 3.5–5.0)
Alkaline Phosphatase: 60 U/L (ref 38–126)
Anion gap: 9 (ref 5–15)
BUN: 28 mg/dL — ABNORMAL HIGH (ref 8–23)
CO2: 30 mmol/L (ref 22–32)
Calcium: 8.9 mg/dL (ref 8.9–10.3)
Chloride: 103 mmol/L (ref 98–111)
Creatinine, Ser: 1.63 mg/dL — ABNORMAL HIGH (ref 0.44–1.00)
GFR, Estimated: 29 mL/min — ABNORMAL LOW (ref >60)
Glucose, Bld: 118 mg/dL — ABNORMAL HIGH (ref 70–99)
Potassium: 3.6 mmol/L (ref 3.5–5.1)
Sodium: 142 mmol/L (ref 135–145)
Total Bilirubin: 0.7 mg/dL (ref 0.3–1.2)
Total Protein: 6.1 g/dL — ABNORMAL LOW (ref 6.5–8.1)

## 2021-08-04 LAB — CBC
HCT: 33.3 % — ABNORMAL LOW (ref 36.0–46.0)
Hemoglobin: 9.7 g/dL — ABNORMAL LOW (ref 12.0–15.0)
MCH: 29.2 pg (ref 26.0–34.0)
MCHC: 29.1 g/dL — ABNORMAL LOW (ref 30.0–36.0)
MCV: 100.3 fL — ABNORMAL HIGH (ref 80.0–100.0)
Platelets: 367 10*3/uL (ref 150–400)
RBC: 3.32 MIL/uL — ABNORMAL LOW (ref 3.87–5.11)
RDW: 14.6 % (ref 11.5–15.5)
WBC: 15.1 10*3/uL — ABNORMAL HIGH (ref 4.0–10.5)
nRBC: 0 % (ref 0.0–0.2)

## 2021-08-04 LAB — CBG MONITORING, ED
Glucose-Capillary: 84 mg/dL (ref 70–99)
Glucose-Capillary: 99 mg/dL (ref 70–99)

## 2021-08-04 NOTE — ED Triage Notes (Signed)
EMS reports- family states they gave patient a bath a few hours before coming to ER and patient slept through the bath.  On arrival EMS reports patient was unconscious and BS was 17.  EMS gave D50 '25mg'$  highest they got BS was 174 and they report their last BS reading before arrival to ER was 140.  Patient does have a cast on left foot from recent fracture

## 2021-08-04 NOTE — ED Notes (Signed)
N/V trial started few sips of orange drink provided to patient.  Patient wants to see if she can keep it down before drinking more

## 2021-08-04 NOTE — ED Notes (Addendum)
Pt states the only thing she has had to eat today was an egg for breakfast, states she got nauseous and threw it back up.

## 2021-08-04 NOTE — ED Provider Notes (Signed)
[facility] EMERGENCY DEPARTMENT Provider Note   CSN: [id number] Arrival date & time: 08/04/21  2155     History  Chief Complaint  Patient presents with   Hypoglycemia    Tiffany Hartman is a 88 y.o. female.  Patient is a 88 year old female with extensive past medical history including type 2 diabetes, Parkinson's disease, chronic renal insufficiency, vascular dementia.  Patient brought from home by EMS for evaluation of altered mental status.  According to family members, she was somnolent throughout the day and had very little to eat or drink.  This evening they had difficulty waking her and 911 was called.  She was found to have a blood sugar of 17.  Patient was given D50 and blood sugar increased to 174, then patient became more alert.  She is awake and alert at the time of my evaluation and has no complaints.  It does appear as though she takes a sulfonylurea.  The history is provided by the patient.       Home Medications Prior to Admission medications   Medication Sig Start Date End Date Taking? Authorizing Provider  acetaminophen (TYLENOL) 500 MG tablet Take 500 mg by mouth 2 (two) times daily. Scheduled and once daily as needed    [provider]  albuterol (VENTOLIN HFA) 108 (90 Base) MCG/ACT inhaler Inhale 2 puffs into the lungs every 6 (six) hours as needed for wheezing or shortness of breath. 07/10/21   Gerlene Fee, NP  ALPRAZolam Duanne Moron) 0.25 MG tablet Take 1 tablet (0.25 mg total) by mouth every 8 (eight) hours as needed for anxiety. 07/10/21   Gerlene Fee, NP  Balsam [place]-Castor Oil [facility]) OINT Apply topically 3 (three) times daily.  Apply to sacrum and bilateral buttocks and coccyx qshift for prevention    [provider]  Calcium Carbonate Antacid (TUMS PO) Take 2 tablets by mouth daily as needed (acid reflux).    [provider]  Calcium Carbonate-Vitamin D 600-200 MG-UNIT TABS Take 1 tablet by mouth every evening.    [provider]  clopidogrel (PLAVIX) 75 MG tablet Take 1 tablet (75 mg total) by mouth daily with breakfast. 07/10/21   Gerlene Fee, NP  donepezil (ARICEPT) 10 MG tablet Take 1 tablet (10 mg total) by mouth at bedtime. 07/10/21   Gerlene Fee, NP  feeding supplement (ENSURE ENLIVE / ENSURE PLUS) LIQD Take 237 mLs by mouth 2 (two) times daily between meals. 06/21/21   Geradine Girt, DO  ferrous sulfate 325 (65 FE) MG tablet Take 1 tablet (325 mg total) by mouth at bedtime. 07/10/21   Gerlene Fee, NP  FLUoxetine (PROZAC) 40 MG capsule Take 1 capsule (40 mg total) by mouth daily. 07/10/21   Gerlene Fee, NP  folic acid (FOLVITE) 1 MG tablet Take 1 tablet (1 mg total) by mouth daily. 07/10/21   Gerlene Fee, NP  gabapentin (NEURONTIN) 300 MG capsule Take 1 capsule (300 mg total) by mouth 3 (three) times daily. 07/10/21   Gerlene Fee, NP  ipratropium-albuterol (DUONEB) 0.5-2.5 (3) MG/3ML SOLN Take 3 mLs by nebulization 2 (two) times daily. And every 6 hours as needed 07/10/21   Gerlene Fee, NP  latanoprost (XALATAN) 0.005 % ophthalmic solution Place 1 drop into both eyes at bedtime. 07/10/21   Gerlene Fee, NP  levETIRAcetam (KEPPRA) 250 MG tablet Take 1 tablet (250 mg total) by mouth daily. 07/10/21   Gerlene Fee, NP  lovastatin (  MEVACOR) 10 MG tablet Take 1 tablet (10 mg total) by mouth at bedtime. 07/10/21   Gerlene Fee, NP  meclizine (ANTIVERT) 12.5 MG tablet Take 1 tablet (12.5 mg total) by mouth 3 (three) times daily as needed for dizziness. 07/10/21   Gerlene Fee, NP  OXYGEN Inhale 2 L/min into the lungs continuous. Titrate as needed to maintain satuation > 90% 02/16/18   [provider]  timolol (TIMOPTIC) 0.5 % ophthalmic solution Place 1 drop into the left eye daily. Wait at least 5 minutes between use of multiple drops in same eye 07/10/21   Gerlene Fee, NP  traMADol (ULTRAM) 50 MG tablet Take 1 tablet (50 mg total) by mouth every 8 (eight) hours as  needed (For pain to right ankle). 07/22/21   Carole Civil, MD  UNABLE TO FIND Diet- Regular thin liquids    [provider]      Allergies    Chloral hydrate    Review of Systems   Review of Systems  All other systems reviewed and are negative.   Physical Exam Updated Vital Signs BP (!) 127/50   Pulse 75   Temp (!) 96.6 F (35.9 C) (Rectal)   Resp 17   Ht '5\' 4"'$  (1.626 m)   Wt 101.2 kg   SpO2 100%   BMI 38.28 kg/m  Physical Exam Vitals and nursing note reviewed.  Constitutional:      General: She is not in acute distress.    Appearance: She is well-developed. She is not diaphoretic.  HENT:     Head: Normocephalic and atraumatic.  Cardiovascular:     Rate and Rhythm: Normal rate and regular rhythm.     Heart sounds: No murmur heard.    No friction rub. No gallop.  Pulmonary:     Effort: Pulmonary effort is normal. No respiratory distress.     Breath sounds: Normal breath sounds. No wheezing.  Abdominal:     General: Bowel sounds are normal. There is no distension.     Palpations: Abdomen is soft.     Tenderness: There is no abdominal tenderness.  Musculoskeletal:        General: Normal range of motion.     Cervical back: Normal range of motion and neck supple.  Skin:    General: Skin is warm and dry.  Neurological:     General: No focal deficit present.     Mental Status: She is alert and oriented to person, place, and time.     ED Results / Procedures / Treatments   Labs (all labs ordered are listed, but only abnormal results are displayed) Labs Reviewed  COMPREHENSIVE METABOLIC PANEL - Abnormal; Notable for the following components:      Result Value   Glucose, Bld 118 (*)    BUN 28 (*)    Creatinine, Ser 1.63 (*)    Total Protein 6.1 (*)    Albumin 2.6 (*)    GFR, Estimated 29 (*)    All other components within normal limits  CBC - Abnormal; Notable for the following components:   WBC 15.1 (*)    RBC 3.32 (*)    Hemoglobin 9.7 (*)     HCT 33.3 (*)    MCV 100.3 (*)    MCHC 29.1 (*)    All other components within normal limits  CBG MONITORING, ED  CBG MONITORING, ED  CBG MONITORING, ED    EKG None  Radiology No results found.  Procedures  Procedures  {Document cardiac monitor, telemetry assessment procedure when appropriate:1}  Medications Ordered in ED Medications - No data to display  ED Course/ Medical Decision Making/ A&P                           Medical Decision Making Amount and/or Complexity of Data Reviewed Labs: ordered.   ***  {Document critical care time when appropriate:1} {Document review of labs and clinical decision tools ie heart score, Chads2Vasc2 etc:1}  {Document your independent review of radiology images, and any outside records:1} {Document your discussion with family members, caretakers, and with consultants:1} {Document social determinants of health affecting pt's care:1} {Document your decision making why or why not admission, treatments were needed:1} Final Clinical Impression(s) / ED Diagnoses Final diagnoses:  None    Rx / DC Orders ED Discharge Orders     None

## 2021-08-05 ENCOUNTER — Telehealth: Payer: Self-pay | Admitting: Radiology

## 2021-08-05 DIAGNOSIS — S82842A Displaced bimalleolar fracture of left lower leg, initial encounter for closed fracture: Secondary | ICD-10-CM

## 2021-08-05 DIAGNOSIS — N1832 Chronic kidney disease, stage 3b: Secondary | ICD-10-CM

## 2021-08-05 DIAGNOSIS — R279 Unspecified lack of coordination: Secondary | ICD-10-CM | POA: Diagnosis not present

## 2021-08-05 DIAGNOSIS — G8194 Hemiplegia, unspecified affecting left nondominant side: Secondary | ICD-10-CM

## 2021-08-05 DIAGNOSIS — Z7401 Bed confinement status: Secondary | ICD-10-CM | POA: Diagnosis not present

## 2021-08-05 DIAGNOSIS — I959 Hypotension, unspecified: Secondary | ICD-10-CM | POA: Diagnosis not present

## 2021-08-05 DIAGNOSIS — R4182 Altered mental status, unspecified: Secondary | ICD-10-CM | POA: Diagnosis not present

## 2021-08-05 DIAGNOSIS — F0150 Vascular dementia without behavioral disturbance: Secondary | ICD-10-CM

## 2021-08-05 LAB — CBG MONITORING, ED
Glucose-Capillary: 125 mg/dL — ABNORMAL HIGH (ref 70–99)
Glucose-Capillary: 164 mg/dL — ABNORMAL HIGH (ref 70–99)
Glucose-Capillary: 64 mg/dL — ABNORMAL LOW (ref 70–99)
Glucose-Capillary: 69 mg/dL — ABNORMAL LOW (ref 70–99)
Glucose-Capillary: 74 mg/dL (ref 70–99)
Glucose-Capillary: 94 mg/dL (ref 70–99)

## 2021-08-05 MED ORDER — DEXTROSE 50 % IV SOLN: 50.0000 mL | Freq: Once | INTRAVENOUS | Status: AC

## 2021-08-05 MED ORDER — DEXTROSE 50 % IV SOLN
INTRAVENOUS | Status: AC
  Administered 2021-08-05: 50 mL via INTRAVENOUS
  Filled 2021-08-05: qty 50

## 2021-08-05 NOTE — Discharge Instructions (Addendum)
Stop taking Glucotrol for the next 2 days and follow your blood sugars at home.  Follow-up with your primary doctor in the next 2 to 3 days, and return to the ER if you experience additional problems.

## 2021-08-05 NOTE — ED Notes (Signed)
Notified [place] [facility] of patient needing transportation back home. 

## 2021-08-05 NOTE — ED Notes (Signed)
Dr. Stark Jock made aware of pts CBG. Verbal order received for D50.

## 2021-08-05 NOTE — ED Notes (Signed)
Pt's daughter given update on pts disposition. Pt is aware that EMS will be transporting pt back home, daughter states she will be home to let EMS in.

## 2021-08-05 NOTE — Telephone Encounter (Signed)
[facility] needed orders, they were sent from facility to [facility] but they dont have enough staff, so I have put in and [facility] will take care of it.

## 2021-08-06 DIAGNOSIS — I1310 Hypertensive heart and chronic kidney disease without heart failure, with stage 1 through stage 4 chronic kidney disease, or unspecified chronic kidney disease: Secondary | ICD-10-CM | POA: Diagnosis not present

## 2021-08-06 DIAGNOSIS — S82832D Other fracture of upper and lower end of left fibula, subsequent encounter for closed fracture with routine healing: Secondary | ICD-10-CM | POA: Diagnosis not present

## 2021-08-06 DIAGNOSIS — S8252XD Displaced fracture of medial malleolus of left tibia, subsequent encounter for closed fracture with routine healing: Secondary | ICD-10-CM | POA: Diagnosis not present

## 2021-08-06 DIAGNOSIS — J9611 Chronic respiratory failure with hypoxia: Secondary | ICD-10-CM | POA: Diagnosis not present

## 2021-08-06 DIAGNOSIS — J440 Chronic obstructive pulmonary disease with acute lower respiratory infection: Secondary | ICD-10-CM | POA: Diagnosis not present

## 2021-08-06 DIAGNOSIS — S93315D Dislocation of tarsal joint of left foot, subsequent encounter: Secondary | ICD-10-CM | POA: Diagnosis not present

## 2021-08-09 ENCOUNTER — Telehealth: Payer: Self-pay

## 2021-08-09 ENCOUNTER — Telehealth: Payer: Self-pay | Admitting: *Deleted

## 2021-08-09 DIAGNOSIS — E1122 Type 2 diabetes mellitus with diabetic chronic kidney disease: Secondary | ICD-10-CM | POA: Diagnosis not present

## 2021-08-09 DIAGNOSIS — E43 Unspecified severe protein-calorie malnutrition: Secondary | ICD-10-CM | POA: Diagnosis not present

## 2021-08-09 DIAGNOSIS — H409 Unspecified glaucoma: Secondary | ICD-10-CM | POA: Diagnosis not present

## 2021-08-09 DIAGNOSIS — M5136 Other intervertebral disc degeneration, lumbar region: Secondary | ICD-10-CM | POA: Diagnosis not present

## 2021-08-09 DIAGNOSIS — H18009 Unspecified corneal deposit, unspecified eye: Secondary | ICD-10-CM | POA: Diagnosis not present

## 2021-08-09 DIAGNOSIS — E041 Nontoxic single thyroid nodule: Secondary | ICD-10-CM | POA: Diagnosis not present

## 2021-08-09 DIAGNOSIS — G20 Parkinson's disease: Secondary | ICD-10-CM | POA: Diagnosis not present

## 2021-08-09 DIAGNOSIS — K219 Gastro-esophageal reflux disease without esophagitis: Secondary | ICD-10-CM | POA: Diagnosis not present

## 2021-08-09 DIAGNOSIS — I69354 Hemiplegia and hemiparesis following cerebral infarction affecting left non-dominant side: Secondary | ICD-10-CM | POA: Diagnosis not present

## 2021-08-09 DIAGNOSIS — S82842D Displaced bimalleolar fracture of left lower leg, subsequent encounter for closed fracture with routine healing: Secondary | ICD-10-CM | POA: Diagnosis not present

## 2021-08-09 DIAGNOSIS — H3530 Unspecified macular degeneration: Secondary | ICD-10-CM | POA: Diagnosis not present

## 2021-08-09 DIAGNOSIS — H35039 Hypertensive retinopathy, unspecified eye: Secondary | ICD-10-CM | POA: Diagnosis not present

## 2021-08-09 DIAGNOSIS — G40909 Epilepsy, unspecified, not intractable, without status epilepticus: Secondary | ICD-10-CM | POA: Diagnosis not present

## 2021-08-09 DIAGNOSIS — N1832 Chronic kidney disease, stage 3b: Secondary | ICD-10-CM | POA: Diagnosis not present

## 2021-08-09 DIAGNOSIS — E785 Hyperlipidemia, unspecified: Secondary | ICD-10-CM | POA: Diagnosis not present

## 2021-08-09 DIAGNOSIS — J449 Chronic obstructive pulmonary disease, unspecified: Secondary | ICD-10-CM | POA: Diagnosis not present

## 2021-08-09 DIAGNOSIS — F339 Major depressive disorder, recurrent, unspecified: Secondary | ICD-10-CM | POA: Diagnosis not present

## 2021-08-09 DIAGNOSIS — E669 Obesity, unspecified: Secondary | ICD-10-CM | POA: Diagnosis not present

## 2021-08-09 DIAGNOSIS — I129 Hypertensive chronic kidney disease with stage 1 through stage 4 chronic kidney disease, or unspecified chronic kidney disease: Secondary | ICD-10-CM | POA: Diagnosis not present

## 2021-08-09 DIAGNOSIS — D472 Monoclonal gammopathy: Secondary | ICD-10-CM | POA: Diagnosis not present

## 2021-08-09 DIAGNOSIS — F0153 Vascular dementia, unspecified severity, with mood disturbance: Secondary | ICD-10-CM | POA: Diagnosis not present

## 2021-08-09 DIAGNOSIS — E1169 Type 2 diabetes mellitus with other specified complication: Secondary | ICD-10-CM | POA: Diagnosis not present

## 2021-08-09 DIAGNOSIS — I69322 Dysarthria following cerebral infarction: Secondary | ICD-10-CM | POA: Diagnosis not present

## 2021-08-09 DIAGNOSIS — F0154 Vascular dementia, unspecified severity, with anxiety: Secondary | ICD-10-CM | POA: Diagnosis not present

## 2021-08-09 DIAGNOSIS — D631 Anemia in chronic kidney disease: Secondary | ICD-10-CM | POA: Diagnosis not present

## 2021-08-09 NOTE — Telephone Encounter (Signed)
Mark with [facility] called needing orders for the following: Home health PT 1 time a week for 1 week, 2 times a week for 6 weeks and 1 time a week for 2 weeks starting 08/09/21. Also, need order for OT for eval and treat starting 08/11/21.  His call back number is [phone] stated that verbal orders can be left on voicemail it is secured.

## 2021-08-09 NOTE — Telephone Encounter (Signed)
Called with verbal orders.  

## 2021-08-09 NOTE — Telephone Encounter (Signed)
        Patient  visited [place] on 08/05/2021  for Low blood sugar    Telephone encounter attempt :  1st l  A HIPAA compliant voice message was left requesting a return call.  Instructed patient to call back at [phone].  [place] [phone] [address] , [place] [address] Email : [email or web address]

## 2021-08-11 DIAGNOSIS — E1165 Type 2 diabetes mellitus with hyperglycemia: Secondary | ICD-10-CM | POA: Diagnosis not present

## 2021-08-11 DIAGNOSIS — I10 Essential (primary) hypertension: Secondary | ICD-10-CM | POA: Diagnosis not present

## 2021-08-12 ENCOUNTER — Telehealth: Payer: Self-pay | Admitting: *Deleted

## 2021-08-12 ENCOUNTER — Other Ambulatory Visit: Payer: Self-pay | Admitting: Adult Health

## 2021-08-12 NOTE — Telephone Encounter (Signed)
      Patient  visit on  08/05/2021  Tiffany Hartman  was for low blood sugar   Have you been able to follow up with your primary care physician?yes  The patient was able to obtain any needed medicine or equipment.  Are there diet recommendations that you are having difficulty following? Yes  Mom doing well blood sugar under control and has reached out to PCP  Patient expresses understanding of discharge instructions and education provided has no other needs at this time.    [place] [phone] [address] , [place] [address] Email : [email or web address]

## 2021-08-15 DIAGNOSIS — I129 Hypertensive chronic kidney disease with stage 1 through stage 4 chronic kidney disease, or unspecified chronic kidney disease: Secondary | ICD-10-CM | POA: Diagnosis not present

## 2021-08-15 DIAGNOSIS — N1832 Chronic kidney disease, stage 3b: Secondary | ICD-10-CM | POA: Diagnosis not present

## 2021-08-15 DIAGNOSIS — S82842D Displaced bimalleolar fracture of left lower leg, subsequent encounter for closed fracture with routine healing: Secondary | ICD-10-CM | POA: Diagnosis not present

## 2021-08-15 DIAGNOSIS — E1122 Type 2 diabetes mellitus with diabetic chronic kidney disease: Secondary | ICD-10-CM | POA: Diagnosis not present

## 2021-08-15 DIAGNOSIS — J449 Chronic obstructive pulmonary disease, unspecified: Secondary | ICD-10-CM | POA: Diagnosis not present

## 2021-08-15 DIAGNOSIS — D631 Anemia in chronic kidney disease: Secondary | ICD-10-CM | POA: Diagnosis not present

## 2021-08-16 DIAGNOSIS — D631 Anemia in chronic kidney disease: Secondary | ICD-10-CM | POA: Diagnosis not present

## 2021-08-16 DIAGNOSIS — J449 Chronic obstructive pulmonary disease, unspecified: Secondary | ICD-10-CM | POA: Diagnosis not present

## 2021-08-16 DIAGNOSIS — N1832 Chronic kidney disease, stage 3b: Secondary | ICD-10-CM | POA: Diagnosis not present

## 2021-08-16 DIAGNOSIS — I129 Hypertensive chronic kidney disease with stage 1 through stage 4 chronic kidney disease, or unspecified chronic kidney disease: Secondary | ICD-10-CM | POA: Diagnosis not present

## 2021-08-16 DIAGNOSIS — E1122 Type 2 diabetes mellitus with diabetic chronic kidney disease: Secondary | ICD-10-CM | POA: Diagnosis not present

## 2021-08-16 DIAGNOSIS — S82842D Displaced bimalleolar fracture of left lower leg, subsequent encounter for closed fracture with routine healing: Secondary | ICD-10-CM | POA: Diagnosis not present

## 2021-08-19 ENCOUNTER — Ambulatory Visit (INDEPENDENT_AMBULATORY_CARE_PROVIDER_SITE_OTHER): Payer: Medicare Other

## 2021-08-19 ENCOUNTER — Ambulatory Visit (INDEPENDENT_AMBULATORY_CARE_PROVIDER_SITE_OTHER): Payer: Medicare Other | Admitting: Orthopedic Surgery

## 2021-08-19 ENCOUNTER — Encounter: Payer: Self-pay | Admitting: Orthopedic Surgery

## 2021-08-19 DIAGNOSIS — S82842A Displaced bimalleolar fracture of left lower leg, initial encounter for closed fracture: Secondary | ICD-10-CM

## 2021-08-19 DIAGNOSIS — S82842D Displaced bimalleolar fracture of left lower leg, subsequent encounter for closed fracture with routine healing: Secondary | ICD-10-CM | POA: Diagnosis not present

## 2021-08-19 NOTE — Progress Notes (Signed)
Chief Complaint  Patient presents with   Post-op Follow-up    Left ankle fracture care DOI 07/05/21    Encounter Diagnosis  Name Primary?   Fracture of ankle, bimalleolar, closed, left, initial encounter Yes   88 year old female 6 weeks post injury cast applied July 17 for bimalleolar fracture left ankle she is in a cast she has multiple medical problems she is on oxygen she is diabetic she is not a surgical candidate so we are excepting some shift if we have to in the ankle mortise  Today's x-rays show fairly preserved ankle mortise bimalleolar fracture in fairly good position we will continue casting x-rays out of plaster 6 weeks

## 2021-08-20 DIAGNOSIS — D631 Anemia in chronic kidney disease: Secondary | ICD-10-CM | POA: Diagnosis not present

## 2021-08-20 DIAGNOSIS — E1122 Type 2 diabetes mellitus with diabetic chronic kidney disease: Secondary | ICD-10-CM | POA: Diagnosis not present

## 2021-08-20 DIAGNOSIS — S82842D Displaced bimalleolar fracture of left lower leg, subsequent encounter for closed fracture with routine healing: Secondary | ICD-10-CM | POA: Diagnosis not present

## 2021-08-20 DIAGNOSIS — I129 Hypertensive chronic kidney disease with stage 1 through stage 4 chronic kidney disease, or unspecified chronic kidney disease: Secondary | ICD-10-CM | POA: Diagnosis not present

## 2021-08-20 DIAGNOSIS — J449 Chronic obstructive pulmonary disease, unspecified: Secondary | ICD-10-CM | POA: Diagnosis not present

## 2021-08-20 DIAGNOSIS — N1832 Chronic kidney disease, stage 3b: Secondary | ICD-10-CM | POA: Diagnosis not present

## 2021-08-22 ENCOUNTER — Telehealth: Payer: Self-pay | Admitting: Orthopedic Surgery

## 2021-08-22 DIAGNOSIS — D631 Anemia in chronic kidney disease: Secondary | ICD-10-CM | POA: Diagnosis not present

## 2021-08-22 DIAGNOSIS — J449 Chronic obstructive pulmonary disease, unspecified: Secondary | ICD-10-CM | POA: Diagnosis not present

## 2021-08-22 DIAGNOSIS — I129 Hypertensive chronic kidney disease with stage 1 through stage 4 chronic kidney disease, or unspecified chronic kidney disease: Secondary | ICD-10-CM | POA: Diagnosis not present

## 2021-08-22 DIAGNOSIS — E1122 Type 2 diabetes mellitus with diabetic chronic kidney disease: Secondary | ICD-10-CM | POA: Diagnosis not present

## 2021-08-22 DIAGNOSIS — N1832 Chronic kidney disease, stage 3b: Secondary | ICD-10-CM | POA: Diagnosis not present

## 2021-08-22 DIAGNOSIS — S82842D Displaced bimalleolar fracture of left lower leg, subsequent encounter for closed fracture with routine healing: Secondary | ICD-10-CM | POA: Diagnosis not present

## 2021-08-22 NOTE — Telephone Encounter (Signed)
I called with Verbal orders for the OT and aide

## 2021-08-22 NOTE — Telephone Encounter (Signed)
Call received from [facility] care, per [facility] - requests verbal orders for:  (1) Occupational therapy: 1 time per week x 5 weeks  (2) Home health aid: 1 time per week x 2 weeks  - ph# [phone]

## 2021-08-27 DIAGNOSIS — D631 Anemia in chronic kidney disease: Secondary | ICD-10-CM | POA: Diagnosis not present

## 2021-08-27 DIAGNOSIS — E1122 Type 2 diabetes mellitus with diabetic chronic kidney disease: Secondary | ICD-10-CM | POA: Diagnosis not present

## 2021-08-27 DIAGNOSIS — I129 Hypertensive chronic kidney disease with stage 1 through stage 4 chronic kidney disease, or unspecified chronic kidney disease: Secondary | ICD-10-CM | POA: Diagnosis not present

## 2021-08-27 DIAGNOSIS — N1832 Chronic kidney disease, stage 3b: Secondary | ICD-10-CM | POA: Diagnosis not present

## 2021-08-27 DIAGNOSIS — J449 Chronic obstructive pulmonary disease, unspecified: Secondary | ICD-10-CM | POA: Diagnosis not present

## 2021-08-27 DIAGNOSIS — S82842D Displaced bimalleolar fracture of left lower leg, subsequent encounter for closed fracture with routine healing: Secondary | ICD-10-CM | POA: Diagnosis not present

## 2021-08-28 DIAGNOSIS — D631 Anemia in chronic kidney disease: Secondary | ICD-10-CM | POA: Diagnosis not present

## 2021-08-28 DIAGNOSIS — I129 Hypertensive chronic kidney disease with stage 1 through stage 4 chronic kidney disease, or unspecified chronic kidney disease: Secondary | ICD-10-CM | POA: Diagnosis not present

## 2021-08-28 DIAGNOSIS — J449 Chronic obstructive pulmonary disease, unspecified: Secondary | ICD-10-CM | POA: Diagnosis not present

## 2021-08-28 DIAGNOSIS — S82842D Displaced bimalleolar fracture of left lower leg, subsequent encounter for closed fracture with routine healing: Secondary | ICD-10-CM | POA: Diagnosis not present

## 2021-08-28 DIAGNOSIS — N1832 Chronic kidney disease, stage 3b: Secondary | ICD-10-CM | POA: Diagnosis not present

## 2021-08-28 DIAGNOSIS — E1122 Type 2 diabetes mellitus with diabetic chronic kidney disease: Secondary | ICD-10-CM | POA: Diagnosis not present

## 2021-08-29 ENCOUNTER — Other Ambulatory Visit: Payer: Self-pay | Admitting: Orthopedic Surgery

## 2021-08-29 DIAGNOSIS — D631 Anemia in chronic kidney disease: Secondary | ICD-10-CM | POA: Diagnosis not present

## 2021-08-29 DIAGNOSIS — N1832 Chronic kidney disease, stage 3b: Secondary | ICD-10-CM | POA: Diagnosis not present

## 2021-08-29 DIAGNOSIS — I129 Hypertensive chronic kidney disease with stage 1 through stage 4 chronic kidney disease, or unspecified chronic kidney disease: Secondary | ICD-10-CM | POA: Diagnosis not present

## 2021-08-29 DIAGNOSIS — J449 Chronic obstructive pulmonary disease, unspecified: Secondary | ICD-10-CM | POA: Diagnosis not present

## 2021-08-29 DIAGNOSIS — S82842D Displaced bimalleolar fracture of left lower leg, subsequent encounter for closed fracture with routine healing: Secondary | ICD-10-CM | POA: Diagnosis not present

## 2021-08-29 DIAGNOSIS — E1122 Type 2 diabetes mellitus with diabetic chronic kidney disease: Secondary | ICD-10-CM | POA: Diagnosis not present

## 2021-08-29 MED ORDER — TRAMADOL HCL 50 MG PO TABS: 50.0000 mg | ORAL_TABLET | Freq: Three times a day (TID) | ORAL | 0 refills | Status: AC | PRN

## 2021-08-29 NOTE — Telephone Encounter (Signed)
Patient's daughter Deneise Lever, designated contact, ph [phone], left a message about patient's medication which she understood was to be refilled per last visit - states has been checking with pharmacy, and states they don't have the refill request:   traMADol (ULTRAM) 50 MG tablet 10 tablet       [facility], Linna Hoff

## 2021-09-01 ENCOUNTER — Other Ambulatory Visit: Payer: Self-pay | Admitting: Adult Health

## 2021-09-03 DIAGNOSIS — S82842D Displaced bimalleolar fracture of left lower leg, subsequent encounter for closed fracture with routine healing: Secondary | ICD-10-CM | POA: Diagnosis not present

## 2021-09-03 DIAGNOSIS — N1832 Chronic kidney disease, stage 3b: Secondary | ICD-10-CM | POA: Diagnosis not present

## 2021-09-03 DIAGNOSIS — J449 Chronic obstructive pulmonary disease, unspecified: Secondary | ICD-10-CM | POA: Diagnosis not present

## 2021-09-03 DIAGNOSIS — E1122 Type 2 diabetes mellitus with diabetic chronic kidney disease: Secondary | ICD-10-CM | POA: Diagnosis not present

## 2021-09-03 DIAGNOSIS — I129 Hypertensive chronic kidney disease with stage 1 through stage 4 chronic kidney disease, or unspecified chronic kidney disease: Secondary | ICD-10-CM | POA: Diagnosis not present

## 2021-09-03 DIAGNOSIS — D631 Anemia in chronic kidney disease: Secondary | ICD-10-CM | POA: Diagnosis not present

## 2021-09-04 DIAGNOSIS — S82842D Displaced bimalleolar fracture of left lower leg, subsequent encounter for closed fracture with routine healing: Secondary | ICD-10-CM | POA: Diagnosis not present

## 2021-09-04 DIAGNOSIS — D631 Anemia in chronic kidney disease: Secondary | ICD-10-CM | POA: Diagnosis not present

## 2021-09-04 DIAGNOSIS — I129 Hypertensive chronic kidney disease with stage 1 through stage 4 chronic kidney disease, or unspecified chronic kidney disease: Secondary | ICD-10-CM | POA: Diagnosis not present

## 2021-09-04 DIAGNOSIS — J449 Chronic obstructive pulmonary disease, unspecified: Secondary | ICD-10-CM | POA: Diagnosis not present

## 2021-09-04 DIAGNOSIS — N1832 Chronic kidney disease, stage 3b: Secondary | ICD-10-CM | POA: Diagnosis not present

## 2021-09-04 DIAGNOSIS — E1122 Type 2 diabetes mellitus with diabetic chronic kidney disease: Secondary | ICD-10-CM | POA: Diagnosis not present

## 2021-09-05 DIAGNOSIS — I129 Hypertensive chronic kidney disease with stage 1 through stage 4 chronic kidney disease, or unspecified chronic kidney disease: Secondary | ICD-10-CM | POA: Diagnosis not present

## 2021-09-05 DIAGNOSIS — E1122 Type 2 diabetes mellitus with diabetic chronic kidney disease: Secondary | ICD-10-CM | POA: Diagnosis not present

## 2021-09-05 DIAGNOSIS — J449 Chronic obstructive pulmonary disease, unspecified: Secondary | ICD-10-CM | POA: Diagnosis not present

## 2021-09-05 DIAGNOSIS — S82842D Displaced bimalleolar fracture of left lower leg, subsequent encounter for closed fracture with routine healing: Secondary | ICD-10-CM | POA: Diagnosis not present

## 2021-09-05 DIAGNOSIS — D631 Anemia in chronic kidney disease: Secondary | ICD-10-CM | POA: Diagnosis not present

## 2021-09-05 DIAGNOSIS — N1832 Chronic kidney disease, stage 3b: Secondary | ICD-10-CM | POA: Diagnosis not present

## 2021-09-08 DIAGNOSIS — E1122 Type 2 diabetes mellitus with diabetic chronic kidney disease: Secondary | ICD-10-CM | POA: Diagnosis not present

## 2021-09-08 DIAGNOSIS — G20 Parkinson's disease: Secondary | ICD-10-CM | POA: Diagnosis not present

## 2021-09-08 DIAGNOSIS — J449 Chronic obstructive pulmonary disease, unspecified: Secondary | ICD-10-CM | POA: Diagnosis not present

## 2021-09-08 DIAGNOSIS — E785 Hyperlipidemia, unspecified: Secondary | ICD-10-CM | POA: Diagnosis not present

## 2021-09-08 DIAGNOSIS — E041 Nontoxic single thyroid nodule: Secondary | ICD-10-CM | POA: Diagnosis not present

## 2021-09-08 DIAGNOSIS — E43 Unspecified severe protein-calorie malnutrition: Secondary | ICD-10-CM | POA: Diagnosis not present

## 2021-09-08 DIAGNOSIS — N1832 Chronic kidney disease, stage 3b: Secondary | ICD-10-CM | POA: Diagnosis not present

## 2021-09-08 DIAGNOSIS — D472 Monoclonal gammopathy: Secondary | ICD-10-CM | POA: Diagnosis not present

## 2021-09-08 DIAGNOSIS — H35039 Hypertensive retinopathy, unspecified eye: Secondary | ICD-10-CM | POA: Diagnosis not present

## 2021-09-08 DIAGNOSIS — H18009 Unspecified corneal deposit, unspecified eye: Secondary | ICD-10-CM | POA: Diagnosis not present

## 2021-09-08 DIAGNOSIS — I69322 Dysarthria following cerebral infarction: Secondary | ICD-10-CM | POA: Diagnosis not present

## 2021-09-08 DIAGNOSIS — G40909 Epilepsy, unspecified, not intractable, without status epilepticus: Secondary | ICD-10-CM | POA: Diagnosis not present

## 2021-09-08 DIAGNOSIS — D631 Anemia in chronic kidney disease: Secondary | ICD-10-CM | POA: Diagnosis not present

## 2021-09-08 DIAGNOSIS — S82842D Displaced bimalleolar fracture of left lower leg, subsequent encounter for closed fracture with routine healing: Secondary | ICD-10-CM | POA: Diagnosis not present

## 2021-09-08 DIAGNOSIS — F0153 Vascular dementia, unspecified severity, with mood disturbance: Secondary | ICD-10-CM | POA: Diagnosis not present

## 2021-09-08 DIAGNOSIS — H409 Unspecified glaucoma: Secondary | ICD-10-CM | POA: Diagnosis not present

## 2021-09-08 DIAGNOSIS — H3530 Unspecified macular degeneration: Secondary | ICD-10-CM | POA: Diagnosis not present

## 2021-09-08 DIAGNOSIS — I69354 Hemiplegia and hemiparesis following cerebral infarction affecting left non-dominant side: Secondary | ICD-10-CM | POA: Diagnosis not present

## 2021-09-08 DIAGNOSIS — E1169 Type 2 diabetes mellitus with other specified complication: Secondary | ICD-10-CM | POA: Diagnosis not present

## 2021-09-08 DIAGNOSIS — K219 Gastro-esophageal reflux disease without esophagitis: Secondary | ICD-10-CM | POA: Diagnosis not present

## 2021-09-08 DIAGNOSIS — I129 Hypertensive chronic kidney disease with stage 1 through stage 4 chronic kidney disease, or unspecified chronic kidney disease: Secondary | ICD-10-CM | POA: Diagnosis not present

## 2021-09-08 DIAGNOSIS — F339 Major depressive disorder, recurrent, unspecified: Secondary | ICD-10-CM | POA: Diagnosis not present

## 2021-09-08 DIAGNOSIS — F0154 Vascular dementia, unspecified severity, with anxiety: Secondary | ICD-10-CM | POA: Diagnosis not present

## 2021-09-08 DIAGNOSIS — E669 Obesity, unspecified: Secondary | ICD-10-CM | POA: Diagnosis not present

## 2021-09-08 DIAGNOSIS — M5136 Other intervertebral disc degeneration, lumbar region: Secondary | ICD-10-CM | POA: Diagnosis not present

## 2021-09-09 DIAGNOSIS — N1832 Chronic kidney disease, stage 3b: Secondary | ICD-10-CM | POA: Diagnosis not present

## 2021-09-09 DIAGNOSIS — J449 Chronic obstructive pulmonary disease, unspecified: Secondary | ICD-10-CM | POA: Diagnosis not present

## 2021-09-09 DIAGNOSIS — S82842D Displaced bimalleolar fracture of left lower leg, subsequent encounter for closed fracture with routine healing: Secondary | ICD-10-CM | POA: Diagnosis not present

## 2021-09-09 DIAGNOSIS — E1122 Type 2 diabetes mellitus with diabetic chronic kidney disease: Secondary | ICD-10-CM | POA: Diagnosis not present

## 2021-09-09 DIAGNOSIS — D631 Anemia in chronic kidney disease: Secondary | ICD-10-CM | POA: Diagnosis not present

## 2021-09-09 DIAGNOSIS — I129 Hypertensive chronic kidney disease with stage 1 through stage 4 chronic kidney disease, or unspecified chronic kidney disease: Secondary | ICD-10-CM | POA: Diagnosis not present

## 2021-09-11 DIAGNOSIS — N1832 Chronic kidney disease, stage 3b: Secondary | ICD-10-CM | POA: Diagnosis not present

## 2021-09-11 DIAGNOSIS — J449 Chronic obstructive pulmonary disease, unspecified: Secondary | ICD-10-CM | POA: Diagnosis not present

## 2021-09-11 DIAGNOSIS — S82842D Displaced bimalleolar fracture of left lower leg, subsequent encounter for closed fracture with routine healing: Secondary | ICD-10-CM | POA: Diagnosis not present

## 2021-09-11 DIAGNOSIS — D631 Anemia in chronic kidney disease: Secondary | ICD-10-CM | POA: Diagnosis not present

## 2021-09-11 DIAGNOSIS — E1122 Type 2 diabetes mellitus with diabetic chronic kidney disease: Secondary | ICD-10-CM | POA: Diagnosis not present

## 2021-09-11 DIAGNOSIS — I129 Hypertensive chronic kidney disease with stage 1 through stage 4 chronic kidney disease, or unspecified chronic kidney disease: Secondary | ICD-10-CM | POA: Diagnosis not present

## 2021-09-12 DIAGNOSIS — E1122 Type 2 diabetes mellitus with diabetic chronic kidney disease: Secondary | ICD-10-CM | POA: Diagnosis not present

## 2021-09-12 DIAGNOSIS — N1832 Chronic kidney disease, stage 3b: Secondary | ICD-10-CM | POA: Diagnosis not present

## 2021-09-12 DIAGNOSIS — S82842D Displaced bimalleolar fracture of left lower leg, subsequent encounter for closed fracture with routine healing: Secondary | ICD-10-CM | POA: Diagnosis not present

## 2021-09-12 DIAGNOSIS — J449 Chronic obstructive pulmonary disease, unspecified: Secondary | ICD-10-CM | POA: Diagnosis not present

## 2021-09-12 DIAGNOSIS — I129 Hypertensive chronic kidney disease with stage 1 through stage 4 chronic kidney disease, or unspecified chronic kidney disease: Secondary | ICD-10-CM | POA: Diagnosis not present

## 2021-09-12 DIAGNOSIS — D631 Anemia in chronic kidney disease: Secondary | ICD-10-CM | POA: Diagnosis not present

## 2021-09-16 DIAGNOSIS — N1832 Chronic kidney disease, stage 3b: Secondary | ICD-10-CM | POA: Diagnosis not present

## 2021-09-16 DIAGNOSIS — I129 Hypertensive chronic kidney disease with stage 1 through stage 4 chronic kidney disease, or unspecified chronic kidney disease: Secondary | ICD-10-CM | POA: Diagnosis not present

## 2021-09-16 DIAGNOSIS — D631 Anemia in chronic kidney disease: Secondary | ICD-10-CM | POA: Diagnosis not present

## 2021-09-16 DIAGNOSIS — E1122 Type 2 diabetes mellitus with diabetic chronic kidney disease: Secondary | ICD-10-CM | POA: Diagnosis not present

## 2021-09-16 DIAGNOSIS — S82842D Displaced bimalleolar fracture of left lower leg, subsequent encounter for closed fracture with routine healing: Secondary | ICD-10-CM | POA: Diagnosis not present

## 2021-09-16 DIAGNOSIS — J449 Chronic obstructive pulmonary disease, unspecified: Secondary | ICD-10-CM | POA: Diagnosis not present

## 2021-09-18 DIAGNOSIS — K314 Gastric diverticulum: Secondary | ICD-10-CM | POA: Diagnosis not present

## 2021-09-18 DIAGNOSIS — J42 Unspecified chronic bronchitis: Secondary | ICD-10-CM | POA: Diagnosis not present

## 2021-09-18 DIAGNOSIS — G20 Parkinson's disease: Secondary | ICD-10-CM | POA: Diagnosis not present

## 2021-09-18 DIAGNOSIS — J449 Chronic obstructive pulmonary disease, unspecified: Secondary | ICD-10-CM | POA: Diagnosis not present

## 2021-09-18 DIAGNOSIS — I639 Cerebral infarction, unspecified: Secondary | ICD-10-CM | POA: Diagnosis not present

## 2021-09-18 DIAGNOSIS — M1990 Unspecified osteoarthritis, unspecified site: Secondary | ICD-10-CM | POA: Diagnosis not present

## 2021-09-18 DIAGNOSIS — H409 Unspecified glaucoma: Secondary | ICD-10-CM | POA: Diagnosis not present

## 2021-09-18 DIAGNOSIS — Z853 Personal history of malignant neoplasm of breast: Secondary | ICD-10-CM | POA: Diagnosis not present

## 2021-09-18 DIAGNOSIS — E162 Hypoglycemia, unspecified: Secondary | ICD-10-CM | POA: Diagnosis not present

## 2021-09-18 DIAGNOSIS — I10 Essential (primary) hypertension: Secondary | ICD-10-CM | POA: Diagnosis not present

## 2021-09-18 DIAGNOSIS — J45909 Unspecified asthma, uncomplicated: Secondary | ICD-10-CM | POA: Diagnosis not present

## 2021-09-18 DIAGNOSIS — I700 Atherosclerosis of aorta: Secondary | ICD-10-CM | POA: Diagnosis not present

## 2021-09-18 DIAGNOSIS — E118 Type 2 diabetes mellitus with unspecified complications: Secondary | ICD-10-CM | POA: Diagnosis not present

## 2021-09-20 DIAGNOSIS — J45909 Unspecified asthma, uncomplicated: Secondary | ICD-10-CM | POA: Diagnosis not present

## 2021-09-20 DIAGNOSIS — E162 Hypoglycemia, unspecified: Secondary | ICD-10-CM | POA: Diagnosis not present

## 2021-09-20 DIAGNOSIS — Z853 Personal history of malignant neoplasm of breast: Secondary | ICD-10-CM | POA: Diagnosis not present

## 2021-09-20 DIAGNOSIS — J449 Chronic obstructive pulmonary disease, unspecified: Secondary | ICD-10-CM | POA: Diagnosis not present

## 2021-09-20 DIAGNOSIS — G20 Parkinson's disease: Secondary | ICD-10-CM | POA: Diagnosis not present

## 2021-09-20 DIAGNOSIS — E118 Type 2 diabetes mellitus with unspecified complications: Secondary | ICD-10-CM | POA: Diagnosis not present

## 2021-09-23 DIAGNOSIS — E162 Hypoglycemia, unspecified: Secondary | ICD-10-CM | POA: Diagnosis not present

## 2021-09-23 DIAGNOSIS — E118 Type 2 diabetes mellitus with unspecified complications: Secondary | ICD-10-CM | POA: Diagnosis not present

## 2021-09-23 DIAGNOSIS — J449 Chronic obstructive pulmonary disease, unspecified: Secondary | ICD-10-CM | POA: Diagnosis not present

## 2021-09-23 DIAGNOSIS — Z853 Personal history of malignant neoplasm of breast: Secondary | ICD-10-CM | POA: Diagnosis not present

## 2021-09-23 DIAGNOSIS — J45909 Unspecified asthma, uncomplicated: Secondary | ICD-10-CM | POA: Diagnosis not present

## 2021-09-23 DIAGNOSIS — G20 Parkinson's disease: Secondary | ICD-10-CM | POA: Diagnosis not present

## 2021-09-24 DIAGNOSIS — E118 Type 2 diabetes mellitus with unspecified complications: Secondary | ICD-10-CM | POA: Diagnosis not present

## 2021-09-24 DIAGNOSIS — J449 Chronic obstructive pulmonary disease, unspecified: Secondary | ICD-10-CM | POA: Diagnosis not present

## 2021-09-24 DIAGNOSIS — Z853 Personal history of malignant neoplasm of breast: Secondary | ICD-10-CM | POA: Diagnosis not present

## 2021-09-24 DIAGNOSIS — G20 Parkinson's disease: Secondary | ICD-10-CM | POA: Diagnosis not present

## 2021-09-24 DIAGNOSIS — J45909 Unspecified asthma, uncomplicated: Secondary | ICD-10-CM | POA: Diagnosis not present

## 2021-09-24 DIAGNOSIS — E162 Hypoglycemia, unspecified: Secondary | ICD-10-CM | POA: Diagnosis not present

## 2021-09-25 DIAGNOSIS — E118 Type 2 diabetes mellitus with unspecified complications: Secondary | ICD-10-CM | POA: Diagnosis not present

## 2021-09-25 DIAGNOSIS — E162 Hypoglycemia, unspecified: Secondary | ICD-10-CM | POA: Diagnosis not present

## 2021-09-25 DIAGNOSIS — J449 Chronic obstructive pulmonary disease, unspecified: Secondary | ICD-10-CM | POA: Diagnosis not present

## 2021-09-25 DIAGNOSIS — J45909 Unspecified asthma, uncomplicated: Secondary | ICD-10-CM | POA: Diagnosis not present

## 2021-09-25 DIAGNOSIS — G20 Parkinson's disease: Secondary | ICD-10-CM | POA: Diagnosis not present

## 2021-09-25 DIAGNOSIS — Z853 Personal history of malignant neoplasm of breast: Secondary | ICD-10-CM | POA: Diagnosis not present

## 2021-09-26 DIAGNOSIS — Z853 Personal history of malignant neoplasm of breast: Secondary | ICD-10-CM | POA: Diagnosis not present

## 2021-09-26 DIAGNOSIS — G20 Parkinson's disease: Secondary | ICD-10-CM | POA: Diagnosis not present

## 2021-09-26 DIAGNOSIS — E118 Type 2 diabetes mellitus with unspecified complications: Secondary | ICD-10-CM | POA: Diagnosis not present

## 2021-09-26 DIAGNOSIS — J45909 Unspecified asthma, uncomplicated: Secondary | ICD-10-CM | POA: Diagnosis not present

## 2021-09-26 DIAGNOSIS — E162 Hypoglycemia, unspecified: Secondary | ICD-10-CM | POA: Diagnosis not present

## 2021-09-26 DIAGNOSIS — J449 Chronic obstructive pulmonary disease, unspecified: Secondary | ICD-10-CM | POA: Diagnosis not present

## 2021-09-27 DIAGNOSIS — J449 Chronic obstructive pulmonary disease, unspecified: Secondary | ICD-10-CM | POA: Diagnosis not present

## 2021-09-27 DIAGNOSIS — E162 Hypoglycemia, unspecified: Secondary | ICD-10-CM | POA: Diagnosis not present

## 2021-09-27 DIAGNOSIS — G20 Parkinson's disease: Secondary | ICD-10-CM | POA: Diagnosis not present

## 2021-09-27 DIAGNOSIS — J45909 Unspecified asthma, uncomplicated: Secondary | ICD-10-CM | POA: Diagnosis not present

## 2021-09-27 DIAGNOSIS — Z853 Personal history of malignant neoplasm of breast: Secondary | ICD-10-CM | POA: Diagnosis not present

## 2021-09-27 DIAGNOSIS — E118 Type 2 diabetes mellitus with unspecified complications: Secondary | ICD-10-CM | POA: Diagnosis not present

## 2021-09-30 ENCOUNTER — Encounter: Payer: Self-pay | Admitting: Orthopedic Surgery

## 2021-09-30 ENCOUNTER — Ambulatory Visit (INDEPENDENT_AMBULATORY_CARE_PROVIDER_SITE_OTHER): Payer: Medicare Other

## 2021-09-30 ENCOUNTER — Ambulatory Visit (INDEPENDENT_AMBULATORY_CARE_PROVIDER_SITE_OTHER): Payer: Medicare Other | Admitting: Orthopedic Surgery

## 2021-09-30 DIAGNOSIS — S82842D Displaced bimalleolar fracture of left lower leg, subsequent encounter for closed fracture with routine healing: Secondary | ICD-10-CM

## 2021-09-30 NOTE — Patient Instructions (Signed)
Wear Aircast for any ambulation  Patient may place weight on left leg now  Continue wound care with hospice

## 2021-09-30 NOTE — Progress Notes (Signed)
Chief Complaint  Patient presents with   Ankle Injury    Left ankle fracture 07/05/21 cast removed without difficulty     88 year old female with severe multiple problems recently was has been brought in.  She is here for follow-up on her bimalleolar ankle fracture.  She was taken out of the cast after 3 months.  She has a heel wound grade 1 she has a large right leg wound unclear as to the cause that may be vascular  Her x-ray shows some shift in the mortise of the ankle and incomplete healing of the fractures  She was placed in an Aircast  She is not a surgical candidate  She will follow-up as needed  Okay to weight-bear with assistance with the Aircast on.  Patient will be seen by hospice nurses for continued wound care  The left heel wound was addressed with dressing.

## 2021-09-30 NOTE — Progress Notes (Signed)
Clinic cancelled due to surgery.

## 2021-10-02 DIAGNOSIS — E118 Type 2 diabetes mellitus with unspecified complications: Secondary | ICD-10-CM | POA: Diagnosis not present

## 2021-10-02 DIAGNOSIS — J45909 Unspecified asthma, uncomplicated: Secondary | ICD-10-CM | POA: Diagnosis not present

## 2021-10-02 DIAGNOSIS — E162 Hypoglycemia, unspecified: Secondary | ICD-10-CM | POA: Diagnosis not present

## 2021-10-02 DIAGNOSIS — G20 Parkinson's disease: Secondary | ICD-10-CM | POA: Diagnosis not present

## 2021-10-02 DIAGNOSIS — Z853 Personal history of malignant neoplasm of breast: Secondary | ICD-10-CM | POA: Diagnosis not present

## 2021-10-02 DIAGNOSIS — J449 Chronic obstructive pulmonary disease, unspecified: Secondary | ICD-10-CM | POA: Diagnosis not present

## 2021-10-03 DIAGNOSIS — J45909 Unspecified asthma, uncomplicated: Secondary | ICD-10-CM | POA: Diagnosis not present

## 2021-10-03 DIAGNOSIS — E162 Hypoglycemia, unspecified: Secondary | ICD-10-CM | POA: Diagnosis not present

## 2021-10-03 DIAGNOSIS — Z853 Personal history of malignant neoplasm of breast: Secondary | ICD-10-CM | POA: Diagnosis not present

## 2021-10-03 DIAGNOSIS — J449 Chronic obstructive pulmonary disease, unspecified: Secondary | ICD-10-CM | POA: Diagnosis not present

## 2021-10-03 DIAGNOSIS — E118 Type 2 diabetes mellitus with unspecified complications: Secondary | ICD-10-CM | POA: Diagnosis not present

## 2021-10-03 DIAGNOSIS — G20 Parkinson's disease: Secondary | ICD-10-CM | POA: Diagnosis not present

## 2021-10-04 DIAGNOSIS — J45909 Unspecified asthma, uncomplicated: Secondary | ICD-10-CM | POA: Diagnosis not present

## 2021-10-04 DIAGNOSIS — G20 Parkinson's disease: Secondary | ICD-10-CM | POA: Diagnosis not present

## 2021-10-04 DIAGNOSIS — E162 Hypoglycemia, unspecified: Secondary | ICD-10-CM | POA: Diagnosis not present

## 2021-10-04 DIAGNOSIS — E118 Type 2 diabetes mellitus with unspecified complications: Secondary | ICD-10-CM | POA: Diagnosis not present

## 2021-10-04 DIAGNOSIS — J449 Chronic obstructive pulmonary disease, unspecified: Secondary | ICD-10-CM | POA: Diagnosis not present

## 2021-10-04 DIAGNOSIS — Z853 Personal history of malignant neoplasm of breast: Secondary | ICD-10-CM | POA: Diagnosis not present

## 2021-11-06 DEATH — deceased

## 2022-03-06 ENCOUNTER — Encounter: Payer: Self-pay | Admitting: Radiology
